# Patient Record
Sex: Female | Born: 1950 | Race: Black or African American | Hispanic: No | Marital: Married | Smoking: Never smoker
Health system: Southern US, Community
[De-identification: ages and names within clinical notes are randomized; demographics above are authoritative.]

## PROBLEM LIST (undated history)

## (undated) DIAGNOSIS — M1990 Unspecified osteoarthritis, unspecified site: Secondary | ICD-10-CM

## (undated) DIAGNOSIS — G5603 Carpal tunnel syndrome, bilateral upper limbs: Secondary | ICD-10-CM

## (undated) DIAGNOSIS — J45909 Unspecified asthma, uncomplicated: Secondary | ICD-10-CM

## (undated) DIAGNOSIS — Z973 Presence of spectacles and contact lenses: Secondary | ICD-10-CM

## (undated) DIAGNOSIS — Z889 Allergy status to unspecified drugs, medicaments and biological substances status: Secondary | ICD-10-CM

## (undated) DIAGNOSIS — M545 Low back pain, unspecified: Secondary | ICD-10-CM

## (undated) DIAGNOSIS — J189 Pneumonia, unspecified organism: Secondary | ICD-10-CM

## (undated) DIAGNOSIS — G8929 Other chronic pain: Secondary | ICD-10-CM

## (undated) DIAGNOSIS — E041 Nontoxic single thyroid nodule: Secondary | ICD-10-CM

## (undated) DIAGNOSIS — D126 Benign neoplasm of colon, unspecified: Secondary | ICD-10-CM

## (undated) DIAGNOSIS — E785 Hyperlipidemia, unspecified: Secondary | ICD-10-CM

## (undated) DIAGNOSIS — Z972 Presence of dental prosthetic device (complete) (partial): Secondary | ICD-10-CM

## (undated) DIAGNOSIS — D649 Anemia, unspecified: Secondary | ICD-10-CM

## (undated) DIAGNOSIS — K219 Gastro-esophageal reflux disease without esophagitis: Secondary | ICD-10-CM

## (undated) DIAGNOSIS — E569 Vitamin deficiency, unspecified: Secondary | ICD-10-CM

## (undated) DIAGNOSIS — R7611 Nonspecific reaction to tuberculin skin test without active tuberculosis: Secondary | ICD-10-CM

## (undated) DIAGNOSIS — E119 Type 2 diabetes mellitus without complications: Secondary | ICD-10-CM

## (undated) DIAGNOSIS — I10 Essential (primary) hypertension: Secondary | ICD-10-CM

## (undated) HISTORY — DX: Benign neoplasm of colon, unspecified: D12.6

## (undated) HISTORY — DX: Anemia, unspecified: D64.9

## (undated) HISTORY — DX: Unspecified asthma, uncomplicated: J45.909

## (undated) HISTORY — PX: TUBAL LIGATION: SHX77

## (undated) HISTORY — PX: COLONOSCOPY: SHX174

## (undated) HISTORY — DX: Hyperlipidemia, unspecified: E78.5

## (undated) HISTORY — PX: ABDOMINAL HYSTERECTOMY: SHX81

## (undated) HISTORY — DX: Essential (primary) hypertension: I10

## (undated) HISTORY — PX: KNEE ARTHROSCOPY: SHX127

## (undated) HISTORY — DX: Nontoxic single thyroid nodule: E04.1

---

## 1998-03-08 ENCOUNTER — Ambulatory Visit (HOSPITAL_COMMUNITY): Admission: RE | Admit: 1998-03-08 | Discharge: 1998-03-08 | Payer: Self-pay | Admitting: Obstetrics and Gynecology

## 1998-05-28 ENCOUNTER — Other Ambulatory Visit: Admission: RE | Admit: 1998-05-28 | Discharge: 1998-05-28 | Payer: Self-pay | Admitting: Obstetrics and Gynecology

## 1998-08-12 ENCOUNTER — Ambulatory Visit (HOSPITAL_COMMUNITY): Admission: RE | Admit: 1998-08-12 | Discharge: 1998-08-12 | Payer: Self-pay | Admitting: Gastroenterology

## 1999-01-22 ENCOUNTER — Other Ambulatory Visit: Admission: RE | Admit: 1999-01-22 | Discharge: 1999-01-22 | Payer: Self-pay | Admitting: Orthopedic Surgery

## 1999-03-26 ENCOUNTER — Encounter: Payer: Self-pay | Admitting: Obstetrics and Gynecology

## 1999-03-26 ENCOUNTER — Ambulatory Visit (HOSPITAL_COMMUNITY): Admission: RE | Admit: 1999-03-26 | Discharge: 1999-03-26 | Payer: Self-pay | Admitting: Obstetrics and Gynecology

## 2000-03-26 ENCOUNTER — Encounter: Payer: Self-pay | Admitting: Obstetrics and Gynecology

## 2000-03-26 ENCOUNTER — Ambulatory Visit (HOSPITAL_COMMUNITY): Admission: RE | Admit: 2000-03-26 | Discharge: 2000-03-26 | Payer: Self-pay | Admitting: Obstetrics and Gynecology

## 2000-04-28 ENCOUNTER — Encounter (INDEPENDENT_AMBULATORY_CARE_PROVIDER_SITE_OTHER): Payer: Self-pay

## 2000-04-28 ENCOUNTER — Other Ambulatory Visit: Admission: RE | Admit: 2000-04-28 | Discharge: 2000-04-28 | Payer: Self-pay | Admitting: Obstetrics and Gynecology

## 2000-05-14 ENCOUNTER — Encounter (INDEPENDENT_AMBULATORY_CARE_PROVIDER_SITE_OTHER): Payer: Self-pay

## 2000-05-14 ENCOUNTER — Other Ambulatory Visit: Admission: RE | Admit: 2000-05-14 | Discharge: 2000-05-14 | Payer: Self-pay | Admitting: Obstetrics and Gynecology

## 2001-04-05 ENCOUNTER — Ambulatory Visit (HOSPITAL_COMMUNITY): Admission: RE | Admit: 2001-04-05 | Discharge: 2001-04-05 | Payer: Self-pay | Admitting: Obstetrics and Gynecology

## 2001-04-05 ENCOUNTER — Encounter: Payer: Self-pay | Admitting: Obstetrics and Gynecology

## 2001-07-22 ENCOUNTER — Encounter (INDEPENDENT_AMBULATORY_CARE_PROVIDER_SITE_OTHER): Payer: Self-pay | Admitting: Specialist

## 2001-07-22 ENCOUNTER — Ambulatory Visit (HOSPITAL_COMMUNITY): Admission: RE | Admit: 2001-07-22 | Discharge: 2001-07-22 | Payer: Self-pay | Admitting: Gastroenterology

## 2001-11-28 ENCOUNTER — Encounter: Payer: Self-pay | Admitting: Internal Medicine

## 2001-11-28 ENCOUNTER — Encounter: Admission: RE | Admit: 2001-11-28 | Discharge: 2001-11-28 | Payer: Self-pay | Admitting: Internal Medicine

## 2003-02-20 ENCOUNTER — Encounter: Admission: RE | Admit: 2003-02-20 | Discharge: 2003-02-20 | Payer: Self-pay | Admitting: Internal Medicine

## 2003-02-20 ENCOUNTER — Encounter: Payer: Self-pay | Admitting: Internal Medicine

## 2003-08-17 ENCOUNTER — Encounter: Payer: Self-pay | Admitting: Internal Medicine

## 2003-08-17 ENCOUNTER — Encounter: Admission: RE | Admit: 2003-08-17 | Discharge: 2003-08-17 | Payer: Self-pay | Admitting: Internal Medicine

## 2005-07-31 ENCOUNTER — Ambulatory Visit (HOSPITAL_COMMUNITY): Admission: RE | Admit: 2005-07-31 | Discharge: 2005-07-31 | Payer: Self-pay | Admitting: Internal Medicine

## 2008-07-24 ENCOUNTER — Encounter: Admission: RE | Admit: 2008-07-24 | Discharge: 2008-07-24 | Payer: Self-pay | Admitting: Gastroenterology

## 2009-06-10 ENCOUNTER — Ambulatory Visit (HOSPITAL_COMMUNITY): Admission: RE | Admit: 2009-06-10 | Discharge: 2009-06-10 | Payer: Self-pay | Admitting: Internal Medicine

## 2009-07-24 ENCOUNTER — Ambulatory Visit: Payer: Self-pay | Admitting: Cardiology

## 2009-08-01 ENCOUNTER — Telehealth: Payer: Self-pay | Admitting: Cardiology

## 2009-08-01 ENCOUNTER — Telehealth (INDEPENDENT_AMBULATORY_CARE_PROVIDER_SITE_OTHER): Payer: Self-pay | Admitting: *Deleted

## 2009-08-06 ENCOUNTER — Encounter: Payer: Self-pay | Admitting: Cardiology

## 2009-08-06 ENCOUNTER — Ambulatory Visit: Payer: Self-pay

## 2009-08-09 ENCOUNTER — Ambulatory Visit: Payer: Self-pay | Admitting: Cardiology

## 2009-08-09 DIAGNOSIS — I10 Essential (primary) hypertension: Secondary | ICD-10-CM | POA: Insufficient documentation

## 2009-08-12 LAB — CONVERTED CEMR LAB
BUN: 12 mg/dL (ref 6–23)
CO2: 24 meq/L (ref 19–32)
Sodium: 140 meq/L (ref 135–145)

## 2009-08-22 ENCOUNTER — Ambulatory Visit: Payer: Self-pay | Admitting: Cardiology

## 2009-08-26 LAB — CONVERTED CEMR LAB
Calcium: 9.4 mg/dL (ref 8.4–10.5)
GFR calc non Af Amer: 73.28 mL/min (ref >60)
Potassium: 3.4 meq/L — ABNORMAL LOW (ref 3.5–5.1)
Sodium: 139 meq/L (ref 135–145)

## 2009-09-02 ENCOUNTER — Ambulatory Visit: Payer: Self-pay

## 2009-09-03 LAB — CONVERTED CEMR LAB
BUN: 11 mg/dL (ref 6–23)
GFR calc non Af Amer: 73.27 mL/min (ref >60)

## 2009-09-13 ENCOUNTER — Ambulatory Visit: Payer: Self-pay | Admitting: Cardiology

## 2009-09-16 LAB — CONVERTED CEMR LAB
BUN: 9 mg/dL (ref 6–23)
CO2: 22 meq/L (ref 19–32)
Calcium: 9.1 mg/dL (ref 8.4–10.5)
Glucose, Bld: 78 mg/dL (ref 70–99)
Potassium: 3.6 meq/L (ref 3.5–5.3)
Sodium: 137 meq/L (ref 135–145)

## 2010-07-01 LAB — FECAL OCCULT BLOOD, GUAIAC: Fecal Occult Blood: NEGATIVE

## 2010-10-20 ENCOUNTER — Encounter: Admission: RE | Admit: 2010-10-20 | Discharge: 2010-10-20 | Payer: Self-pay | Admitting: Gastroenterology

## 2010-10-31 ENCOUNTER — Encounter: Admission: RE | Admit: 2010-10-31 | Discharge: 2010-10-31 | Payer: Self-pay | Admitting: Internal Medicine

## 2010-11-28 ENCOUNTER — Ambulatory Visit (HOSPITAL_COMMUNITY)
Admission: RE | Admit: 2010-11-28 | Discharge: 2010-11-28 | Payer: Self-pay | Source: Home / Self Care | Attending: Obstetrics and Gynecology | Admitting: Obstetrics and Gynecology

## 2010-11-28 HISTORY — PX: BILATERAL SALPINGOOPHORECTOMY: SHX1223

## 2011-02-09 LAB — BASIC METABOLIC PANEL
BUN: 9 mg/dL (ref 6–23)
CO2: 27 mEq/L (ref 19–32)
Calcium: 9.4 mg/dL (ref 8.4–10.5)
Creatinine, Ser: 0.77 mg/dL (ref 0.4–1.2)
GFR calc Af Amer: >60 mL/min (ref >60)

## 2011-02-09 LAB — CBC
MCH: 25.9 pg — ABNORMAL LOW (ref 26.0–34.0)
MCHC: 31 g/dL (ref 30.0–36.0)
Platelets: 283 10*3/uL (ref 150–400)
RBC: 4.71 MIL/uL (ref 3.87–5.11)
RDW: 14.4 % (ref 11.5–15.5)

## 2011-02-09 LAB — SURGICAL PCR SCREEN: MRSA, PCR: NEGATIVE

## 2011-06-30 ENCOUNTER — Other Ambulatory Visit: Payer: Self-pay | Admitting: Cardiology

## 2012-01-23 ENCOUNTER — Other Ambulatory Visit: Payer: Self-pay | Admitting: Cardiology

## 2012-01-25 NOTE — Telephone Encounter (Signed)
PATIENT NEEDS TO SCHEDULE AN APPT, TO RECEIVE REFILLS

## 2012-02-15 ENCOUNTER — Other Ambulatory Visit: Payer: Self-pay | Admitting: Cardiology

## 2012-02-26 ENCOUNTER — Other Ambulatory Visit: Payer: Self-pay | Admitting: Cardiology

## 2012-03-15 ENCOUNTER — Other Ambulatory Visit (HOSPITAL_COMMUNITY): Payer: Self-pay | Admitting: Internal Medicine

## 2012-03-15 ENCOUNTER — Ambulatory Visit (HOSPITAL_COMMUNITY)
Admission: RE | Admit: 2012-03-15 | Discharge: 2012-03-15 | Disposition: A | Discharge status: Home / Self Care | Payer: BC Managed Care – PPO | Source: Ambulatory Visit | Attending: Internal Medicine | Admitting: Internal Medicine

## 2012-03-15 DIAGNOSIS — R05 Cough: Secondary | ICD-10-CM

## 2012-03-15 DIAGNOSIS — R059 Cough, unspecified: Secondary | ICD-10-CM

## 2012-03-15 DIAGNOSIS — R0602 Shortness of breath: Secondary | ICD-10-CM | POA: Insufficient documentation

## 2012-03-25 ENCOUNTER — Other Ambulatory Visit (HOSPITAL_COMMUNITY): Payer: Self-pay | Admitting: Internal Medicine

## 2012-03-25 ENCOUNTER — Ambulatory Visit (HOSPITAL_COMMUNITY)
Admission: RE | Admit: 2012-03-25 | Discharge: 2012-03-25 | Disposition: A | Discharge status: Home / Self Care | Payer: BC Managed Care – PPO | Source: Ambulatory Visit | Attending: Internal Medicine | Admitting: Internal Medicine

## 2012-03-25 DIAGNOSIS — R059 Cough, unspecified: Secondary | ICD-10-CM

## 2012-03-25 DIAGNOSIS — J189 Pneumonia, unspecified organism: Secondary | ICD-10-CM | POA: Insufficient documentation

## 2012-03-25 DIAGNOSIS — R05 Cough: Secondary | ICD-10-CM

## 2012-03-30 DIAGNOSIS — J189 Pneumonia, unspecified organism: Secondary | ICD-10-CM

## 2012-03-30 HISTORY — DX: Pneumonia, unspecified organism: J18.9

## 2012-04-19 ENCOUNTER — Other Ambulatory Visit (HOSPITAL_COMMUNITY): Payer: Self-pay | Admitting: Internal Medicine

## 2012-04-19 ENCOUNTER — Emergency Department (HOSPITAL_COMMUNITY)
Admission: EM | Admit: 2012-04-19 | Discharge: 2012-04-19 | Disposition: A | Discharge status: Home / Self Care | Payer: BC Managed Care – PPO | Attending: Emergency Medicine | Admitting: Emergency Medicine

## 2012-04-19 ENCOUNTER — Encounter (HOSPITAL_COMMUNITY): Payer: Self-pay | Admitting: Physical Medicine and Rehabilitation

## 2012-04-19 ENCOUNTER — Ambulatory Visit (HOSPITAL_COMMUNITY)
Admission: RE | Admit: 2012-04-19 | Discharge: 2012-04-19 | Disposition: A | Discharge status: Home / Self Care | Payer: BC Managed Care – PPO | Source: Ambulatory Visit | Attending: Internal Medicine | Admitting: Internal Medicine

## 2012-04-19 ENCOUNTER — Other Ambulatory Visit: Payer: Self-pay

## 2012-04-19 DIAGNOSIS — R5381 Other malaise: Secondary | ICD-10-CM | POA: Insufficient documentation

## 2012-04-19 DIAGNOSIS — R490 Dysphonia: Secondary | ICD-10-CM | POA: Insufficient documentation

## 2012-04-19 DIAGNOSIS — R0602 Shortness of breath: Secondary | ICD-10-CM | POA: Insufficient documentation

## 2012-04-19 DIAGNOSIS — Z8701 Personal history of pneumonia (recurrent): Secondary | ICD-10-CM | POA: Insufficient documentation

## 2012-04-19 DIAGNOSIS — M549 Dorsalgia, unspecified: Secondary | ICD-10-CM | POA: Insufficient documentation

## 2012-04-19 DIAGNOSIS — Z79899 Other long term (current) drug therapy: Secondary | ICD-10-CM | POA: Insufficient documentation

## 2012-04-19 DIAGNOSIS — J40 Bronchitis, not specified as acute or chronic: Secondary | ICD-10-CM

## 2012-04-19 LAB — BASIC METABOLIC PANEL
BUN: 14 mg/dL (ref 6–23)
Chloride: 102 mEq/L (ref 96–112)
GFR calc non Af Amer: 77 mL/min — ABNORMAL LOW (ref >90)
Glucose, Bld: 99 mg/dL (ref 70–99)
Potassium: 3.8 mEq/L (ref 3.5–5.1)
Sodium: 136 mEq/L (ref 135–145)

## 2012-04-19 LAB — CBC
HCT: 38.9 % (ref 36.0–46.0)
Hemoglobin: 12.5 g/dL (ref 12.0–15.0)
MCH: 26.5 pg (ref 26.0–34.0)
RBC: 4.72 MIL/uL (ref 3.87–5.11)

## 2012-04-19 NOTE — ED Provider Notes (Signed)
History     CSN: [id number]  Arrival date & time 04/19/12  1708   First MD Initiated Contact with Patient 04/19/12 1742      Chief Complaint  Patient presents with  . Shortness of Breath    (Consider location/radiation/quality/duration/timing/severity/associated sxs/prior treatment) HPI Pt presents with c/o cough, fatigue, shortness of breath.  Pt states she has taken a full course of zpack- then was prescribed moxifloxacin and has 2 more days of antibiotic course.  Her cough is improved but continues to have some cough productive of yellow mucous.  No fever/chills, no chest pain.  Generalized fatigue, no fainting.  Has been drinking liquids well.  She also c/o hoarse voice.  Pt had CXR earlier today ordered by her physician.  Per chart review, CXR performed today was normal.  There are no other alleviating or modifying factors, thera are no other associated systemic symptoms. She has also been using albuterol inhaler which does provide some relief.   No past medical history on file.  No past surgical history on file.  No family history on file.  History  Substance Use Topics  . Smoking status: Never Smoker   . Smokeless tobacco: Not on file  . Alcohol Use: No    OB History    Grav Para Term Preterm Abortions TAB SAB Ect Mult Living                  Review of Systems ROS reviewed and all otherwise negative except for mentioned in HPI  Allergies  Metronidazole  Home Medications   Current Outpatient Rx  Name Route Sig Dispense Refill  . ESTROGENS CONJ SYNTHETIC B 0.45 MG PO TABS Oral Take 0.45 mg by mouth daily.    Marland Kitchen FEXOFENADINE HCL 180 MG PO TABS Oral Take 180 mg by mouth daily.    Marland Kitchen MAGNESIUM PO Oral Take 1 tablet by mouth daily.    Marland Kitchen MOXIFLOXACIN HCL 400 MG PO TABS Oral Take 400 mg by mouth daily.    Marland Kitchen RABEPRAZOLE SODIUM 20 MG PO TBEC Oral Take 20 mg by mouth daily.    . RED YEAST RICE PO Oral Take 1 capsule by mouth daily.      BP 146/77  Pulse 60   Temp(Src) 97.8 F (36.6 C) (Oral)  Resp 18  SpO2 100% Vitals reviewed Physical Exam Physical Examination: General appearance - alert, well appearing, and in no distress Mental status - alert, oriented to person, place, and time Eyes - pupils equal and reactive, no conjunctival injection or scleral icterus Mouth - mucous membranes moist, pharynx normal without lesions Neck - supple, no significant adenopathy Chest - clear to auscultation, no wheezes, rales or rhonchi, symmetric air entry Heart - normal rate, regular rhythm, normal S1, S2, no murmurs, rubs, clicks or gallops Abdomen - soft, nontender, nondistended, no masses or organomegaly, nabs Extremities - peripheral pulses normal, no pedal edema, no clubbing or cyanosis Skin - normal coloration and turgor, no rashes, brisk cap refill  ED Course  Procedures (including critical care time)  Labs Reviewed  BASIC METABOLIC PANEL - Abnormal; Notable for the following:    GFR calc non Af Amer 77 (*)    GFR calc Af Amer 90 (*)    All other components within normal limits  CBC - Abnormal; Notable for the following:    WBC 14.6 (*)    RDW 15.7 (*)    All other components within normal limits  LAB REPORT - SCANNED   Dg  Chest 2 View  04/19/2012  *RADIOLOGY REPORT*  Clinical Data: Pneumonia, shortness of breath and upper back pain  CHEST - 2 VIEW  Comparison: March 25, 2012  Findings: The cardiac silhouette, mediastinum, pulmonary vasculature are within normal limits.  Both lungs are clear.  No new or recurrent infiltrates or effusions are identified today. There is no acute bony abnormality.  IMPRESSION: There is no evidence of acute cardiac or pulmonary process.  Original Report Authenticated By: Brandon Melnick, M.D.     1. Bronchitis       MDM  Pt presents with c/o cough, fatigue ongoing for several weeks.  She has taken zpack, currently taking moxifloxacin- has normal CXR performed today at an outside facility- images were  reviewed by me as well.  Suspect bronchitis- pt instructed to continue hydration, continue albuterol inhaler and finish abx as she had been prescribed by her doctor.  Labs reassuring- not anemic.  Discharged with strict return precautions.  Pt agreeable with plan.        Ethelda Chick, MD 04/20/12 [phone]

## 2012-04-19 NOTE — Discharge Instructions (Signed)
Return to the ED with any concerns including difficulty breathing despite using albuterol every 4 hours, not drinking fluids, decreased urine output, vomiting and not able to keep down liquids or medications, decreased level of alertness/lethargy, or any other alarming symptoms °

## 2012-04-19 NOTE — ED Notes (Signed)
Pt presents to department for evaluation of SOB. States she was told on 5/16 that she had URI and was prescribed a z-pack. States SOB has increased since. Went to [facility] today and was diagnosed with pneumonia. Pt states "I am tired and want to figure out what's going on." respirations unlabored at the time. She is speaking complete sentences. Denies pain. No signs of acute distress at the time.

## 2012-04-19 NOTE — ED Notes (Addendum)
Pt c/o feeling SOB. Pt reports she has been feeling very fatigued the past couple days. Pt reports she is congested and has a cough. Pt reports while talking & walking she feels winded. Pt reports her voice is hoarse.  Pt in no acute distress. Pt breathing unlabored.

## 2012-04-23 ENCOUNTER — Emergency Department (HOSPITAL_COMMUNITY)
Admission: EM | Admit: 2012-04-23 | Discharge: 2012-04-23 | Disposition: A | Discharge status: Home / Self Care | Payer: BC Managed Care – PPO | Attending: Emergency Medicine | Admitting: Emergency Medicine

## 2012-04-23 ENCOUNTER — Emergency Department (HOSPITAL_COMMUNITY): Payer: BC Managed Care – PPO

## 2012-04-23 ENCOUNTER — Encounter (HOSPITAL_COMMUNITY): Payer: Self-pay

## 2012-04-23 DIAGNOSIS — S51819A Laceration without foreign body of unspecified forearm, initial encounter: Secondary | ICD-10-CM

## 2012-04-23 DIAGNOSIS — W01119A Fall on same level from slipping, tripping and stumbling with subsequent striking against unspecified sharp object, initial encounter: Secondary | ICD-10-CM | POA: Insufficient documentation

## 2012-04-23 DIAGNOSIS — Y93H9 Activity, other involving exterior property and land maintenance, building and construction: Secondary | ICD-10-CM | POA: Insufficient documentation

## 2012-04-23 DIAGNOSIS — Y998 Other external cause status: Secondary | ICD-10-CM | POA: Insufficient documentation

## 2012-04-23 DIAGNOSIS — Y92009 Unspecified place in unspecified non-institutional (private) residence as the place of occurrence of the external cause: Secondary | ICD-10-CM | POA: Insufficient documentation

## 2012-04-23 DIAGNOSIS — S51809A Unspecified open wound of unspecified forearm, initial encounter: Secondary | ICD-10-CM | POA: Insufficient documentation

## 2012-04-23 MED ORDER — TETANUS-DIPHTH-ACELL PERTUSSIS 5-2.5-18.5 LF-MCG/0.5 IM SUSP
0.5000 mL | Freq: Once | INTRAMUSCULAR | Status: AC
  Administered 2012-04-23: 0.5 mL via INTRAMUSCULAR
  Filled 2012-04-23: qty 0.5

## 2012-04-23 NOTE — ED Notes (Signed)
Patient discharged home by Damon, RN.

## 2012-04-23 NOTE — ED Provider Notes (Signed)
Medical screening examination/treatment/procedure(s) were performed by non-physician practitioner and as supervising physician I was immediately available for consultation/collaboration.   Lyanne Co, MD 04/23/12 [phone]

## 2012-04-23 NOTE — Discharge Instructions (Signed)
Your wound has been repaired with sutures. Please keep the area clean and dry. You can wash the area as normal but do not scrub at the stitches. Your sutures will need to be removed in 7 days. You can follow up with your primary care doctor, at urgent care, or in the ED for this. When the stitches come out, apply vitamin E, cocoa butter, or Mederma to help with scarring. Use extra sunscreen on the scar when out in the sun to avoid the scar darkening. If you notice pus from the wound, redness of the surrounding skin, fever, swelling, or other concerns about worsening condition, return to the ED.  Laceration Care, Adult A laceration is a cut or lesion that goes through all layers of the skin and into the tissue just beneath the skin. TREATMENT  Some lacerations may not require closure. Some lacerations may not be able to be closed due to an increased risk of infection. It is important to see your caregiver as soon as possible after an injury to minimize the risk of infection and maximize the opportunity for successful closure. If closure is appropriate, pain medicines may be given, if needed. The wound will be cleaned to help prevent infection. Your caregiver will use stitches (sutures), staples, wound glue (adhesive), or skin adhesive strips to repair the laceration. These tools bring the skin edges together to allow for faster healing and a better cosmetic outcome. However, all wounds will heal with a scar. Once the wound has healed, scarring can be minimized by covering the wound with sunscreen during the day for 1 full year. HOME CARE INSTRUCTIONS  For sutures or staples:  Keep the wound clean and dry.   If you were given a bandage (dressing), you should change it at least once a day. Also, change the dressing if it becomes wet or dirty, or as directed by your caregiver.   Wash the wound with soap and water 2 times a day. Rinse the wound off with water to remove all soap. Pat the wound dry with a  clean towel.   After cleaning, apply a thin layer of the antibiotic ointment as recommended by your caregiver. This will help prevent infection and keep the dressing from sticking.   You may shower as usual after the first 24 hours. Do not soak the wound in water until the sutures are removed.   Only take over-the-counter or prescription medicines for pain, discomfort, or fever as directed by your caregiver.   Get your sutures or staples removed as directed by your caregiver.  For skin adhesive strips:  Keep the wound clean and dry.   Do not get the skin adhesive strips wet. You may bathe carefully, using caution to keep the wound dry.   If the wound gets wet, pat it dry with a clean towel.   Skin adhesive strips will fall off on their own. You may trim the strips as the wound heals. Do not remove skin adhesive strips that are still stuck to the wound. They will fall off in time.  For wound adhesive:  You may briefly wet your wound in the shower or bath. Do not soak or scrub the wound. Do not swim. Avoid periods of heavy perspiration until the skin adhesive has fallen off on its own. After showering or bathing, gently pat the wound dry with a clean towel.   Do not apply liquid medicine, cream medicine, or ointment medicine to your wound while the skin adhesive is in  place. This may loosen the film before your wound is healed.   If a dressing is placed over the wound, be careful not to apply tape directly over the skin adhesive. This may cause the adhesive to be pulled off before the wound is healed.   Avoid prolonged exposure to sunlight or tanning lamps while the skin adhesive is in place. Exposure to ultraviolet light in the first year will darken the scar.   The skin adhesive will usually remain in place for 5 to 10 days, then naturally fall off the skin. Do not pick at the adhesive film.  You may need a tetanus shot if:  You cannot remember when you had your last tetanus shot.    You have never had a tetanus shot.  If you get a tetanus shot, your arm may swell, get red, and feel warm to the touch. This is common and not a problem. If you need a tetanus shot and you choose not to have one, there is a rare chance of getting tetanus. Sickness from tetanus can be serious. SEEK MEDICAL CARE IF:   You have redness, swelling, or increasing pain in the wound.   You see a red line that goes away from the wound.   You have yellowish-white fluid (pus) coming from the wound.   You have a fever.   You notice a bad smell coming from the wound or dressing.   Your wound breaks open before or after sutures have been removed.   You notice something coming out of the wound such as wood or glass.   Your wound is on your hand or foot and you cannot move a finger or toe.  SEEK IMMEDIATE MEDICAL CARE IF:   Your pain is not controlled with prescribed medicine.   You have severe swelling around the wound causing pain and numbness or a change in color in your arm, hand, leg, or foot.   Your wound splits open and starts bleeding.   You have worsening numbness, weakness, or loss of function of any joint around or beyond the wound.   You develop painful lumps near the wound or on the skin anywhere on your body.  MAKE SURE YOU:   Understand these instructions.   Will watch your condition.   Will get help right away if you are not doing well or get worse.  Document Released: 11/16/2005 Document Revised: 11/05/2011 Document Reviewed: 05/12/2011 [facility] Patient Information 2012 [place], [place].

## 2012-04-23 NOTE — ED Notes (Signed)
Patient is AOx4 and comfortable with her discharge instructions.  Patient's family is driving her home. 

## 2012-04-23 NOTE — ED Provider Notes (Signed)
History     CSN: [id number]  Arrival date & time 04/23/12  1727   First MD Initiated Contact with Patient 04/23/12 1747      Chief Complaint  Patient presents with  . Laceration    (Consider location/radiation/quality/duration/timing/severity/associated sxs/prior treatment) HPI History from patient. 61 year old female who presents after a fall with a laceration to her right forearm. States she was working in the yard and tripped over some bricks. She fell, struck striking her right elbow and knee. Denies hitting head or LOC. She does not have any reduced range of motion to her knee or elbow. Denies distal numbness or weakness in her right arm. She attempted to dress the areas but was concerned that the area on her forearm may require sutures. Unsure of tetanus status.  History reviewed. No pertinent past medical history.  History reviewed. No pertinent past surgical history.  No family history on file.  History  Substance Use Topics  . Smoking status: Never Smoker   . Smokeless tobacco: Not on file  . Alcohol Use: No    OB History    Grav Para Term Preterm Abortions TAB SAB Ect Mult Living                  Review of Systems  Constitutional: Negative for fever, chills, activity change and appetite change.  Musculoskeletal: Negative for myalgias.  Skin: Positive for wound. Negative for color change.  Neurological: Negative for dizziness and headaches.    Allergies  Metronidazole  Home Medications   Current Outpatient Rx  Name Route Sig Dispense Refill  . ESTROGENS CONJ SYNTHETIC B 0.45 MG PO TABS Oral Take 0.45 mg by mouth daily.    Marland Kitchen FEXOFENADINE HCL 180 MG PO TABS Oral Take 180 mg by mouth daily.    Marland Kitchen MAGNESIUM PO Oral Take 1 tablet by mouth daily.    Marland Kitchen MONTELUKAST SODIUM 10 MG PO TABS Oral Take 10 mg by mouth daily.    Marland Kitchen RABEPRAZOLE SODIUM 20 MG PO TBEC Oral Take 20 mg by mouth daily.    . RED YEAST RICE PO Oral Take 1 capsule by mouth daily.    Marland Kitchen  MOXIFLOXACIN HCL 400 MG PO TABS Oral Take 400 mg by mouth daily.      BP 165/78  Pulse 95  Temp(Src) 97.6 F (36.4 C) (Oral)  Resp 20  SpO2 95%  Physical Exam  Nursing note and vitals reviewed. Constitutional: She appears well-developed and well-nourished. No distress.  HENT:  Head: Normocephalic and atraumatic.  Neck: Normal range of motion.  Cardiovascular: Normal rate.   Pulmonary/Chest: Effort normal.  Musculoskeletal: Normal range of motion.       Arms:      Slightly tender to palpation over the proximal ulna, no palpable crepitus or deformity. Neurovascularly intact distally. Good grip strength. Full range of motion in the elbow.  Neurological: She is alert.  Skin: Skin is warm and dry. She is not diaphoretic.       Superficial abrasion to R elbow, appears clean, bleeding well controlled Laceration to R forearm measuring ~1.5cm, clean appearing, bleeding well controlled Superficial abrasion to R knee  Psychiatric: She has a normal mood and affect.    ED Course  Procedures (including critical care time)  LACERATION REPAIR Performed by: Grant Fontana Authorized by: Grant Fontana Consent: Verbal consent obtained. Risks and benefits: risks, benefits and alternatives were discussed Consent given by: patient Patient identity confirmed: provided demographic data Prepped and Draped in normal sterile  fashion Wound explored  Laceration Location: R forearm  Laceration Length: 1.5cm  No Foreign Bodies seen or palpated  Anesthesia: local infiltration  Local anesthetic: lidocaine 2% with epinephrine  Anesthetic total: 4 ml  Irrigation method: syringe Amount of cleaning: standard  Skin closure: 4-0 Prolene  Number of sutures: 3  Technique: simple interrupted  Patient tolerance: Patient tolerated the procedure well with no immediate complications.  Labs Reviewed - No data to display Dg Elbow Complete Right  04/23/2012  *RADIOLOGY REPORT*  Clinical  Data: Fall laceration and pain and proximal forearm.  RIGHT ELBOW - COMPLETE 3+ VIEW  Comparison: None.  Findings: Laceration, with probable overlying bandage noted at proximal forearm.  The elbow is located.  There is no evidence of joint effusion.  Negative for fracture, joint space abnormality, or focal bony abnormality.  No discrete soft tissue swelling appreciated.  IMPRESSION:  1.  No acute bony abnormality. 2.  Laceration with probable overlying bandage material proximal forearm.  Original Report Authenticated By: Britta Mccreedy, M.D.     1. Laceration of forearm       MDM  Patient presents with a laceration to her right forearm after falling. No head injury. Full range of motion of the elbow. X-ray, which I personally reviewed is negative for fracture. Area was sutured, which patient tolerated well. Tetanus updated. She is to followup with her PCP for suture removal in one week. Wound care and return precautions discussed.       Grant Fontana, [place] 04/23/12 1936  Grant Fontana, [place] 04/23/12 [phone]

## 2012-04-23 NOTE — ED Notes (Signed)
Pt. Tripped over bricks denies laceration to rt. Knee and above rt. Elbow both are not bleeding and approximately 1 inch

## 2012-04-28 ENCOUNTER — Ambulatory Visit (INDEPENDENT_AMBULATORY_CARE_PROVIDER_SITE_OTHER): Payer: BC Managed Care – PPO | Admitting: Physician Assistant

## 2012-04-28 VITALS — BP 144/85 | HR 84 | Temp 97.7°F | Resp 16 | Ht 63.0 in | Wt 194.6 lb

## 2012-04-28 DIAGNOSIS — S51809A Unspecified open wound of unspecified forearm, initial encounter: Secondary | ICD-10-CM

## 2012-04-28 MED ORDER — CEPHALEXIN 500 MG PO CAPS: 500.0000 mg | ORAL_CAPSULE | Freq: Two times a day (BID) | ORAL | Status: AC

## 2012-04-28 NOTE — Progress Notes (Signed)
  Subjective:    Patient ID: Lauren Lopez, female    DOB: 1951-03-10, 61 y.o.   MRN: [id number]  HPI Ms. Mcenroe comes in today to follow up on a wound repair, right forearm, done 04/23/12 at [facility] ER after a ?fall vs tripping over brick in yard.  She had cardiac evaluation.  She is concerned about the wound as it has yellow material around the sutures and it is painful.  She has been changing the dressing regularly and applying antibiotic ointment.  She has not had fever or chills, numbness or tingling, purulent drainage or streaking.    Review of Systems As noted above in HPI    Objective:   Physical Exam Right proximal forearm with wound that has 3 simple interrupted sutures intact with moist wound edges with eschar noted.  No erythema or induration.  No streaking.  Lateral end of wound tender but abrasion noted in same area. No pain with wrist or elbow ROM.       Assessment & Plan:  Wound forearm  Recommend stop antibiotic ointment and let it dry out.  Wound care reviewed.  RX for Keflex provided if pain and eschar persists.  She will call or RTC if concerned.  Anticipate SR in 3-6 days.

## 2012-04-28 NOTE — Patient Instructions (Signed)
Keep wound dry and do not apply any ointments or creams.  You may wash the wound 1 -2 times a day.  Let it air out when you can.  Fill the antibiotic if it continues to be painful or it drains pus.  Call if you are not sure.

## 2012-06-30 ENCOUNTER — Other Ambulatory Visit (HOSPITAL_COMMUNITY): Payer: Self-pay | Admitting: Internal Medicine

## 2012-06-30 ENCOUNTER — Ambulatory Visit (HOSPITAL_COMMUNITY)
Admission: RE | Admit: 2012-06-30 | Discharge: 2012-06-30 | Disposition: A | Discharge status: Home / Self Care | Payer: PRIVATE HEALTH INSURANCE | Source: Ambulatory Visit | Attending: Internal Medicine | Admitting: Internal Medicine

## 2012-06-30 DIAGNOSIS — R059 Cough, unspecified: Secondary | ICD-10-CM | POA: Insufficient documentation

## 2012-06-30 DIAGNOSIS — R0989 Other specified symptoms and signs involving the circulatory and respiratory systems: Secondary | ICD-10-CM | POA: Insufficient documentation

## 2012-06-30 DIAGNOSIS — R7611 Nonspecific reaction to tuberculin skin test without active tuberculosis: Secondary | ICD-10-CM

## 2012-06-30 DIAGNOSIS — R05 Cough: Secondary | ICD-10-CM | POA: Insufficient documentation

## 2012-07-12 ENCOUNTER — Other Ambulatory Visit: Payer: Self-pay | Admitting: Orthopedic Surgery

## 2012-07-12 ENCOUNTER — Encounter (HOSPITAL_BASED_OUTPATIENT_CLINIC_OR_DEPARTMENT_OTHER): Payer: Self-pay | Admitting: *Deleted

## 2012-07-13 ENCOUNTER — Encounter (HOSPITAL_BASED_OUTPATIENT_CLINIC_OR_DEPARTMENT_OTHER): Payer: Self-pay | Admitting: *Deleted

## 2012-07-13 NOTE — Progress Notes (Signed)
No labs needed

## 2012-07-15 ENCOUNTER — Encounter (HOSPITAL_BASED_OUTPATIENT_CLINIC_OR_DEPARTMENT_OTHER): Payer: Self-pay | Admitting: Certified Registered Nurse Anesthetist

## 2012-07-15 ENCOUNTER — Ambulatory Visit (HOSPITAL_BASED_OUTPATIENT_CLINIC_OR_DEPARTMENT_OTHER): Payer: Worker's Compensation | Admitting: Anesthesiology

## 2012-07-15 ENCOUNTER — Encounter (HOSPITAL_BASED_OUTPATIENT_CLINIC_OR_DEPARTMENT_OTHER)
Admission: RE | Disposition: A | Discharge status: Home / Self Care | Payer: Self-pay | Source: Ambulatory Visit | Attending: Orthopedic Surgery

## 2012-07-15 ENCOUNTER — Ambulatory Visit (HOSPITAL_BASED_OUTPATIENT_CLINIC_OR_DEPARTMENT_OTHER)
Admission: RE | Admit: 2012-07-15 | Discharge: 2012-07-15 | Disposition: A | Discharge status: Home / Self Care | Payer: Worker's Compensation | Source: Ambulatory Visit | Attending: Orthopedic Surgery | Admitting: Orthopedic Surgery

## 2012-07-15 ENCOUNTER — Encounter (HOSPITAL_BASED_OUTPATIENT_CLINIC_OR_DEPARTMENT_OTHER): Payer: Self-pay

## 2012-07-15 ENCOUNTER — Encounter (HOSPITAL_BASED_OUTPATIENT_CLINIC_OR_DEPARTMENT_OTHER): Payer: Self-pay | Admitting: Anesthesiology

## 2012-07-15 DIAGNOSIS — G5600 Carpal tunnel syndrome, unspecified upper limb: Secondary | ICD-10-CM | POA: Insufficient documentation

## 2012-07-15 DIAGNOSIS — I10 Essential (primary) hypertension: Secondary | ICD-10-CM | POA: Insufficient documentation

## 2012-07-15 DIAGNOSIS — K219 Gastro-esophageal reflux disease without esophagitis: Secondary | ICD-10-CM | POA: Insufficient documentation

## 2012-07-15 HISTORY — DX: Presence of spectacles and contact lenses: Z97.3

## 2012-07-15 HISTORY — PX: CARPAL TUNNEL RELEASE: SHX101

## 2012-07-15 HISTORY — DX: Unspecified osteoarthritis, unspecified site: M19.90

## 2012-07-15 HISTORY — DX: Nonspecific reaction to tuberculin skin test without active tuberculosis: R76.11

## 2012-07-15 HISTORY — DX: Gastro-esophageal reflux disease without esophagitis: K21.9

## 2012-07-15 HISTORY — DX: Presence of dental prosthetic device (complete) (partial): Z97.2

## 2012-07-15 HISTORY — DX: Carpal tunnel syndrome, bilateral upper limbs: G56.03

## 2012-07-15 HISTORY — DX: Pneumonia, unspecified organism: J18.9

## 2012-07-15 HISTORY — DX: Allergy status to unspecified drugs, medicaments and biological substances: Z88.9

## 2012-07-15 SURGERY — CARPAL TUNNEL RELEASE
Anesthesia: General | Site: Hand | Laterality: Left | Wound class: Clean

## 2012-07-15 MED ORDER — METOCLOPRAMIDE HCL 5 MG/ML IJ SOLN
INTRAMUSCULAR | Status: DC | PRN
  Administered 2012-07-15: 10 mg via INTRAVENOUS

## 2012-07-15 MED ORDER — FENTANYL CITRATE 0.05 MG/ML IJ SOLN
INTRAMUSCULAR | Status: DC | PRN
  Administered 2012-07-15 (×2): 50 ug via INTRAVENOUS

## 2012-07-15 MED ORDER — LIDOCAINE HCL 2 % IJ SOLN
INTRAMUSCULAR | Status: DC | PRN
  Administered 2012-07-15: 4 mL

## 2012-07-15 MED ORDER — LACTATED RINGERS IV SOLN
INTRAVENOUS | Status: DC
  Administered 2012-07-15 (×2): via INTRAVENOUS

## 2012-07-15 MED ORDER — ONDANSETRON HCL 4 MG/2ML IJ SOLN
INTRAMUSCULAR | Status: DC | PRN
  Administered 2012-07-15: 4 mg via INTRAVENOUS

## 2012-07-15 MED ORDER — OXYCODONE-ACETAMINOPHEN 5-325 MG PO TABS: ORAL_TABLET | ORAL | Status: DC

## 2012-07-15 MED ORDER — MIDAZOLAM HCL 5 MG/5ML IJ SOLN
INTRAMUSCULAR | Status: DC | PRN
  Administered 2012-07-15: 1 mg via INTRAVENOUS

## 2012-07-15 MED ORDER — PROPOFOL 10 MG/ML IV EMUL
INTRAVENOUS | Status: DC | PRN
  Administered 2012-07-15: 220 mg via INTRAVENOUS

## 2012-07-15 MED ORDER — DEXAMETHASONE SODIUM PHOSPHATE 10 MG/ML IJ SOLN
INTRAMUSCULAR | Status: DC | PRN
  Administered 2012-07-15: 10 mg via INTRAVENOUS

## 2012-07-15 MED ORDER — CHLORHEXIDINE GLUCONATE 4 % EX LIQD: 60.0000 mL | Freq: Once | CUTANEOUS | Status: DC

## 2012-07-15 MED ORDER — LIDOCAINE HCL (CARDIAC) 20 MG/ML IV SOLN
INTRAVENOUS | Status: DC | PRN
  Administered 2012-07-15: 50 mg via INTRAVENOUS

## 2012-07-15 SURGICAL SUPPLY — 38 items
BANDAGE ADHESIVE 1X3 (GAUZE/BANDAGES/DRESSINGS) IMPLANT
BANDAGE ELASTIC 3 VELCRO ST LF (GAUZE/BANDAGES/DRESSINGS) ×2 IMPLANT
BLADE SURG 15 STRL LF DISP TIS (BLADE) ×1 IMPLANT
BLADE SURG 15 STRL SS (BLADE) ×2
BNDG CMPR 9X4 STRL LF SNTH (GAUZE/BANDAGES/DRESSINGS) ×1
BNDG ESMARK 4X9 LF (GAUZE/BANDAGES/DRESSINGS) ×1 IMPLANT
BRUSH SCRUB EZ PLAIN DRY (MISCELLANEOUS) ×2 IMPLANT
CLOTH BEACON ORANGE TIMEOUT ST (SAFETY) ×2 IMPLANT
CORDS BIPOLAR (ELECTRODE) ×1 IMPLANT
COVER MAYO STAND STRL (DRAPES) ×2 IMPLANT
COVER TABLE BACK 60X90 (DRAPES) ×2 IMPLANT
CUFF TOURNIQUET SINGLE 18IN (TOURNIQUET CUFF) ×1 IMPLANT
DECANTER SPIKE VIAL GLASS SM (MISCELLANEOUS) IMPLANT
DRAPE EXTREMITY T 121X128X90 (DRAPE) ×2 IMPLANT
DRAPE SURG 17X23 STRL (DRAPES) ×2 IMPLANT
GLOVE BIO SURGEON STRL SZ 6.5 (GLOVE) ×1 IMPLANT
GLOVE BIOGEL M STRL SZ7.5 (GLOVE) ×2 IMPLANT
GLOVE ECLIPSE 6.5 STRL STRAW (GLOVE) ×1 IMPLANT
GLOVE ORTHO TXT STRL SZ7.5 (GLOVE) ×2 IMPLANT
GOWN PREVENTION PLUS XLARGE (GOWN DISPOSABLE) ×2 IMPLANT
GOWN PREVENTION PLUS XXLARGE (GOWN DISPOSABLE) ×4 IMPLANT
NEEDLE 27GAX1X1/2 (NEEDLE) ×1 IMPLANT
PACK BASIN DAY SURGERY FS (CUSTOM PROCEDURE TRAY) ×2 IMPLANT
PAD CAST 3X4 CTTN HI CHSV (CAST SUPPLIES) ×1 IMPLANT
PADDING CAST ABS 4INX4YD NS (CAST SUPPLIES) ×1
PADDING CAST ABS COTTON 4X4 ST (CAST SUPPLIES) ×1 IMPLANT
PADDING CAST COTTON 3X4 STRL (CAST SUPPLIES) ×2
SPLINT PLASTER CAST XFAST 3X15 (CAST SUPPLIES) ×5 IMPLANT
SPLINT PLASTER XTRA FASTSET 3X (CAST SUPPLIES) ×5
SPONGE GAUZE 4X4 12PLY (GAUZE/BANDAGES/DRESSINGS) ×2 IMPLANT
STOCKINETTE 4X48 STRL (DRAPES) ×2 IMPLANT
STRIP CLOSURE SKIN 1/2X4 (GAUZE/BANDAGES/DRESSINGS) ×2 IMPLANT
SUT PROLENE 3 0 PS 2 (SUTURE) ×2 IMPLANT
SYR 3ML 23GX1 SAFETY (SYRINGE) IMPLANT
SYR CONTROL 10ML LL (SYRINGE) ×1 IMPLANT
TRAY DSU PREP LF (CUSTOM PROCEDURE TRAY) ×2 IMPLANT
UNDERPAD 30X30 INCONTINENT (UNDERPADS AND DIAPERS) ×2 IMPLANT
WATER STERILE IRR 1000ML POUR (IV SOLUTION) ×2 IMPLANT

## 2012-07-15 NOTE — Anesthesia Preprocedure Evaluation (Signed)
Anesthesia Evaluation  Patient identified by MRN, date of birth, ID band Patient awake    Reviewed: Allergy & Precautions, H&P , NPO status , Patient's Chart, lab work & pertinent test results, reviewed documented beta blocker date and time   Airway Mallampati: II TM Distance: >3 FB Neck ROM: full    Dental   Pulmonary neg pulmonary ROS, pneumonia -, resolved,  breath sounds clear to auscultation        Cardiovascular hypertension, On Medications negative cardio ROS  Rhythm:regular     Neuro/Psych  Neuromuscular disease negative psych ROS   GI/Hepatic Neg liver ROS, GERD-  Medicated and Controlled,  Endo/Other  negative endocrine ROS  Renal/GU negative Renal ROS  negative genitourinary   Musculoskeletal   Abdominal   Peds  Hematology negative hematology ROS (+)   Anesthesia Other Findings See surgeon's H&P   Reproductive/Obstetrics negative OB ROS                           Anesthesia Physical Anesthesia Plan  ASA: II  Anesthesia Plan: General   Post-op Pain Management:    Induction: Intravenous  Airway Management Planned: LMA  Additional Equipment:   Intra-op Plan:   Post-operative Plan: Extubation in OR  Informed Consent: I have reviewed the patients History and Physical, chart, labs and discussed the procedure including the risks, benefits and alternatives for the proposed anesthesia with the patient or authorized representative who has indicated his/her understanding and acceptance.   Dental Advisory Given  Plan Discussed with: CRNA and Surgeon  Anesthesia Plan Comments:         Anesthesia Quick Evaluation

## 2012-07-15 NOTE — Anesthesia Postprocedure Evaluation (Signed)
Anesthesia Post Note  Patient: Lauren Lopez  Procedure(s) Performed: Procedure(s) (LRB): CARPAL TUNNEL RELEASE (Left)  Anesthesia type: General  Patient location: PACU  Post pain: Pain level controlled  Post assessment: Patient's Cardiovascular Status Stable  Last Vitals:  Filed Vitals:   07/15/12 0945  BP: 147/84  Pulse: 60  Temp:   Resp: 11    Post vital signs: Reviewed and stable  Level of consciousness: alert  Complications: No apparent anesthesia complications

## 2012-07-15 NOTE — Anesthesia Procedure Notes (Signed)
Procedure Name: LMA Insertion Date/Time: 07/15/2012 8:37 AM Performed by: Lucine Bilski D Pre-anesthesia Checklist: Patient identified, Emergency Drugs available, Suction available and Patient being monitored Patient Re-evaluated:Patient Re-evaluated prior to inductionOxygen Delivery Method: Circle System Utilized Preoxygenation: Pre-oxygenation with 100% oxygen Intubation Type: IV induction Ventilation: Mask ventilation without difficulty LMA: LMA inserted LMA Size: 4.0 Number of attempts: 1 Airway Equipment and Method: bite block Placement Confirmation: positive ETCO2 Tube secured with: Tape Dental Injury: Teeth and Oropharynx as per pre-operative assessment

## 2012-07-15 NOTE — Brief Op Note (Signed)
07/15/2012  9:10 AM  PATIENT:  Lauren Lopez  61 y.o. female  PRE-OPERATIVE DIAGNOSIS:  bilateral cts  POST-OPERATIVE DIAGNOSIS:  left carpal tunnel syndrome  PROCEDURE:  Procedure(s) (LRB): CARPAL TUNNEL RELEASE (Left)  SURGEON:  Surgeon(s) and Role:    * Wyn Forster., MD - Primary  PHYSICIAN ASSISTANT:   ASSISTANTS: Mallory Shirk.A-C    ANESTHESIA:   local  EBL:  Total I/O In: 1000 [I.V.:1000] Out: -   BLOOD ADMINISTERED:none  DRAINS: none   LOCAL MEDICATIONS USED:  LIDOCAINE   SPECIMEN:  No Specimen  DISPOSITION OF SPECIMEN:  N/A  COUNTS:  YES  TOURNIQUET:   Total Tourniquet Time Documented: Upper Arm (Left) - 12 minutes  DICTATION: .Other Dictation: Dictation Number [id number]  PLAN OF CARE: Discharge to home after PACU  PATIENT DISPOSITION:  PACU - hemodynamically stable.

## 2012-07-15 NOTE — Transfer of Care (Signed)
Immediate Anesthesia Transfer of Care Note  Patient: Lauren Lopez  Procedure(s) Performed: Procedure(s) (LRB): CARPAL TUNNEL RELEASE (Left)  Patient Location: PACU  Anesthesia Type: General  Level of Consciousness: awake, alert , oriented and patient cooperative  Airway & Oxygen Therapy: Patient Spontanous Breathing and Patient connected to face mask oxygen  Post-op Assessment: Report given to PACU RN and Post -op Vital signs reviewed and stable  Post vital signs: Reviewed and stable  Complications: No apparent anesthesia complications

## 2012-07-15 NOTE — H&P (Signed)
Lauren Lopez is an 61 y.o. female.   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 She has had a more than 9 month history of numbness in her left hand and occasional numbness in the right hand. She notes dysesthesia extending from her finger tips towards the elbow. She has had no history of neck pain or neck injury. She has been wearing splints but has had nocturnal numbness in the left hand despite splinting. She was worked up at [name] in November 2012 and was noted to have possible stenosing tenosynovitis and carpal tunnel syndrome. She was referred to see Dr. Gean Lopez at [facility] in December 2012. Dr. Turner Lopez identified carpal tunnel syndrome. He recommended electrodiagnostic studies. These were completed by Dr. Clarisse Lopez on 12-11-11. Dr. Clarisse Lopez identified evidence of mild to moderate left carpal tunnel syndrome. Dr. Turner Lopez advised Lauren Lopez that she could proceed with release of the left transverse carpal ligament or consider a steroid injection. In mid July thousand 13. She underwent injection into the left ulnar bursa and was placed in a splint for nighttime use. She reports that it 3 weeks later. The conservative treatment. Provided her with essentially no relief of her symptoms. She was awakened 7 out of 7 nights with numbness and tingling. She wishes to proceed now with surgical intervention.   Past Medical History  Diagnosis Date  . Pneumonia 5/13  . Multiple allergies   . GERD (gastroesophageal reflux disease)   . Arthritis   . Bilateral carpal tunnel syndrome   . Wears glasses   . Wears dentures     upper  . Positive PPD     Past Surgical History  Procedure Date  . Bilateral salpingoophorectomy 11/28/2010    open laparoscopy with adhesiolysis also  . Knee arthroscopy 2005    rt  . Colonoscopy     No family history on file. Social  History:  reports that she has never smoked. She does not have any smokeless tobacco history on file. She reports that she does not drink alcohol or use illicit drugs.  Allergies:  Allergies  Allergen Reactions  . Metronidazole Other (See Comments)    unknown    Medications Prior to Admission  Medication Sig Dispense Refill  . estrogens conjugated, synthetic B, (ENJUVIA) 0.45 MG tablet Take 0.45 mg by mouth daily.      . fexofenadine (ALLEGRA) 180 MG tablet Take 180 mg by mouth daily.      Marland Kitchen MAGNESIUM PO Take 1 tablet by mouth daily.      . montelukast (SINGULAIR) 10 MG tablet Take 10 mg by mouth daily.      . RABEprazole (ACIPHEX) 20 MG tablet Take 20 mg by mouth daily.      . Red Yeast Rice Extract (RED YEAST RICE PO) Take 1 capsule by mouth daily.        No results found for this or any previous visit (from the past 48 hour(s)).  No results found.   Pertinent items are noted in HPI.  Height 5\' 3"  (1.6 m), weight 86.183 kg (190 lb).  General appearance: alert Head: Normocephalic, without obvious abnormality Neck: supple, symmetrical, trachea midline Resp: clear to auscultation bilaterally Cardio: regular rate and rhythm GI: normal findings: bowel sounds normal Extremities: . Inspection of her hand reveals mild thenar atrophy on the left. She has full ROM of her fingers in flexion/extension. She  does have positive provocative signs of carpal tunnel syndrome. She does not have active signs of stenosing tenosynovitis at this time.  She does report that she has had episodic numbness on the right. She did have conduction studies performed by Dr. Clarisse Lopez that revealed borderline conduction parameters with median and motor latency and sensory latency. EMG was normal bilaterally.   Pulses: 2+ and symmetric Skin: normal Neurologic: Grossly normal    Assessment/Plan Impression: Left carpal tunnel syndrome  Plan: Patient to be taken to the operating room to undergo left carpal  tunnel release. The procedure, risks, benefits, and postoperative course were discussed with the patient at length and she was in agreement with this plan.  Lauren Lopez 07/15/2012, 7:12 AM     H&P documentation: 07/15/2012  -History and Physical Reviewed  -Patient has been re-examined  -No change in the plan of care  Lauren Forster, MD

## 2012-07-18 ENCOUNTER — Encounter (HOSPITAL_BASED_OUTPATIENT_CLINIC_OR_DEPARTMENT_OTHER): Payer: Self-pay | Admitting: Orthopedic Surgery

## 2012-07-18 NOTE — Op Note (Signed)
NAME:  [name], [name]             ACCOUNT NO.:  [id number]  MEDICAL RECORD NO.:  [id number]  LOCATION:                                 FACILITY:  PHYSICIAN:  Katy Fitch. Hendricks Schwandt, M.D. DATE OF BIRTH:  11-19-1951  DATE OF PROCEDURE:  07/15/2012 DATE OF DISCHARGE:                              OPERATIVE REPORT   PREOPERATIVE DIAGNOSIS:  Entrapment neuropathy, median nerve, left carpal tunnel.  POSTOPERATIVE DIAGNOSIS:  Entrapment neuropathy, median nerve, left carpal tunnel.  OPERATIONS:  Release of left transverse carpal ligament.  OPERATING SURGEON:  Katy Fitch. Devontaye Ground, M.D.  ASSISTANT:  Marveen Reeks. [name], PA-C.  ANESTHESIA:  General by LMA.  SUPERVISING ANESTHESIOLOGIST:  Janetta Hora. Gelene Mink, MD.  INDICATIONS:  Lauren Lopez is a 61 year old employee of [employer] with a history of hand numbness and pain.  Clinical examination revealed signs of significant carpal tunnel syndrome.  Electrodiagnostic studies completed by Dr. Clarisse Gouge revealed significant median neuropathy at wrist level.  Due to the failure to respond to nonoperative measures including a steroid injection provided previously by Dr. Turner Daniels, she is advised to proceed with release of her left transverse carpal ligament at this time.  Surgery and aftercare were described in detail.  Questions were invited and answered.  We advised that she likely be out of work for approximately 4-6 weeks following surgery.  Questions regarding the anticipated procedure were invited and answered in detail.  PROCEDURE:  Lauren Lopez was brought to room 1 of the [facility] and placed in supine position on the operating table.  Following the induction of general anesthesia by LMA technique, the left arm was prepped with Betadine soap and solution, sterilely draped.  A pneumatic tourniquet was applied to the proximal left brachium.  Following exsanguination of the left arm with Esmarch bandage, an arterial tourniquet  was inflated to 220 mmHg.  Procedure commenced with a short incision in the line of the ring finger in the palm. Subcutaneous tissues were carefully divided to reveal the palmar fascia. This split longitudinally to reveal the common sensory branch of the median nerve.  These were followed back to the transverse carpal ligament, which was gently isolated from the median nerve.  The transverse carpal ligament was then sequentially released with scissors along its ulnar border extending into the distal forearm.  This widely opened carpal canal.  No masses or other predicaments were noted. Bleeding points along the margin of the released ligament were electrocauterized with bipolar current followed by repair of the skin with intradermal 3-0 Prolene suture.  A compressive dressing was applied with a volar plaster splint, maintaining the wrist in 15 degrees of dorsiflexion.  For aftercare, Lauren Lopez is provided instruction to elevate her hand for the next 4-5 days.  She will begin immediate range of motion exercises to her fingers and shoulder.  She will use Tylenol or Aleve and/or Percocet as needed for pain.  She is provided a prescription for Percocet 5/325, 1 p.o. q.4-6 hours p.r.n. pain, 20 tablets, without refill.     Katy Fitch Nichollas Perusse, M.D.     RVS/MEDQ  D:  07/15/2012  T:  07/16/2012  Job:  [id number]

## 2012-11-15 ENCOUNTER — Ambulatory Visit (HOSPITAL_COMMUNITY)
Admission: RE | Admit: 2012-11-15 | Discharge: 2012-11-15 | Disposition: A | Discharge status: Home / Self Care | Payer: PRIVATE HEALTH INSURANCE | Source: Ambulatory Visit | Attending: Internal Medicine | Admitting: Internal Medicine

## 2012-11-15 ENCOUNTER — Other Ambulatory Visit (HOSPITAL_COMMUNITY): Payer: Self-pay | Admitting: Internal Medicine

## 2012-11-15 DIAGNOSIS — M25519 Pain in unspecified shoulder: Secondary | ICD-10-CM | POA: Insufficient documentation

## 2012-11-15 DIAGNOSIS — R52 Pain, unspecified: Secondary | ICD-10-CM

## 2013-01-02 ENCOUNTER — Other Ambulatory Visit: Payer: Self-pay | Admitting: Orthopedic Surgery

## 2013-01-02 DIAGNOSIS — M25511 Pain in right shoulder: Secondary | ICD-10-CM

## 2013-01-07 ENCOUNTER — Other Ambulatory Visit: Payer: Self-pay

## 2013-01-18 ENCOUNTER — Encounter (HOSPITAL_BASED_OUTPATIENT_CLINIC_OR_DEPARTMENT_OTHER): Payer: Self-pay | Admitting: *Deleted

## 2013-01-18 NOTE — Progress Notes (Signed)
Pt was here 8/13-did well-no labs needed

## 2013-01-20 ENCOUNTER — Encounter (HOSPITAL_BASED_OUTPATIENT_CLINIC_OR_DEPARTMENT_OTHER)
Admission: RE | Disposition: A | Discharge status: Home / Self Care | Payer: Self-pay | Source: Ambulatory Visit | Attending: Orthopedic Surgery

## 2013-01-20 ENCOUNTER — Encounter (HOSPITAL_BASED_OUTPATIENT_CLINIC_OR_DEPARTMENT_OTHER): Payer: Self-pay

## 2013-01-20 ENCOUNTER — Ambulatory Visit (HOSPITAL_BASED_OUTPATIENT_CLINIC_OR_DEPARTMENT_OTHER)
Admission: RE | Admit: 2013-01-20 | Discharge: 2013-01-20 | Disposition: A | Discharge status: Home / Self Care | Payer: BC Managed Care – PPO | Source: Ambulatory Visit | Attending: Orthopedic Surgery | Admitting: Orthopedic Surgery

## 2013-01-20 ENCOUNTER — Encounter (HOSPITAL_BASED_OUTPATIENT_CLINIC_OR_DEPARTMENT_OTHER): Payer: Self-pay | Admitting: Anesthesiology

## 2013-01-20 ENCOUNTER — Ambulatory Visit (HOSPITAL_BASED_OUTPATIENT_CLINIC_OR_DEPARTMENT_OTHER): Payer: BC Managed Care – PPO | Admitting: Anesthesiology

## 2013-01-20 DIAGNOSIS — M75101 Unspecified rotator cuff tear or rupture of right shoulder, not specified as traumatic: Secondary | ICD-10-CM

## 2013-01-20 DIAGNOSIS — M25819 Other specified joint disorders, unspecified shoulder: Secondary | ICD-10-CM | POA: Insufficient documentation

## 2013-01-20 DIAGNOSIS — I10 Essential (primary) hypertension: Secondary | ICD-10-CM | POA: Insufficient documentation

## 2013-01-20 DIAGNOSIS — M75120 Complete rotator cuff tear or rupture of unspecified shoulder, not specified as traumatic: Secondary | ICD-10-CM | POA: Insufficient documentation

## 2013-01-20 HISTORY — DX: Unspecified rotator cuff tear or rupture of right shoulder, not specified as traumatic: M75.101

## 2013-01-20 HISTORY — PX: SHOULDER ARTHROSCOPY WITH ROTATOR CUFF REPAIR AND SUBACROMIAL DECOMPRESSION: SHX5686

## 2013-01-20 LAB — POCT HEMOGLOBIN-HEMACUE: Hemoglobin: 13 g/dL (ref 12.0–15.0)

## 2013-01-20 SURGERY — SHOULDER ARTHROSCOPY WITH ROTATOR CUFF REPAIR AND SUBACROMIAL DECOMPRESSION
Anesthesia: General | Site: Shoulder | Laterality: Right | Wound class: Clean

## 2013-01-20 MED ORDER — EPHEDRINE SULFATE 50 MG/ML IJ SOLN
INTRAMUSCULAR | Status: DC | PRN
  Administered 2013-01-20 (×2): 10 mg via INTRAVENOUS

## 2013-01-20 MED ORDER — SENNA-DOCUSATE SODIUM 8.6-50 MG PO TABS: 1.0000 | ORAL_TABLET | Freq: Every day | ORAL | Status: DC

## 2013-01-20 MED ORDER — SODIUM CHLORIDE 0.9 % IR SOLN
Status: DC | PRN
  Administered 2013-01-20: 12000 mL

## 2013-01-20 MED ORDER — ONDANSETRON HCL 4 MG/2ML IJ SOLN
INTRAMUSCULAR | Status: DC | PRN
  Administered 2013-01-20: 4 mg via INTRAVENOUS

## 2013-01-20 MED ORDER — FENTANYL CITRATE 0.05 MG/ML IJ SOLN
50.0000 ug | INTRAMUSCULAR | Status: DC | PRN
  Administered 2013-01-20: 100 ug via INTRAVENOUS

## 2013-01-20 MED ORDER — OXYCODONE-ACETAMINOPHEN 10-325 MG PO TABS: 1.0000 | ORAL_TABLET | Freq: Four times a day (QID) | ORAL | Status: DC | PRN

## 2013-01-20 MED ORDER — PROPOFOL 10 MG/ML IV BOLUS
INTRAVENOUS | Status: DC | PRN
  Administered 2013-01-20: 200 mg via INTRAVENOUS

## 2013-01-20 MED ORDER — OXYCODONE HCL 5 MG/5ML PO SOLN: 5.0000 mg | Freq: Once | ORAL | Status: AC | PRN

## 2013-01-20 MED ORDER — LACTATED RINGERS IV SOLN
INTRAVENOUS | Status: DC
  Administered 2013-01-20 (×2): via INTRAVENOUS

## 2013-01-20 MED ORDER — BUPIVACAINE HCL (PF) 0.5 % IJ SOLN
INTRAMUSCULAR | Status: DC | PRN
  Administered 2013-01-20: 15 mL

## 2013-01-20 MED ORDER — HYDROMORPHONE HCL PF 1 MG/ML IJ SOLN: 0.2500 mg | INTRAMUSCULAR | Status: DC | PRN

## 2013-01-20 MED ORDER — LIDOCAINE HCL (CARDIAC) 20 MG/ML IV SOLN
INTRAVENOUS | Status: DC | PRN
  Administered 2013-01-20: 60 mg via INTRAVENOUS

## 2013-01-20 MED ORDER — MIDAZOLAM HCL 2 MG/2ML IJ SOLN
1.0000 mg | INTRAMUSCULAR | Status: DC | PRN
  Administered 2013-01-20: 2 mg via INTRAVENOUS

## 2013-01-20 MED ORDER — LIDOCAINE HCL 4 % MT SOLN
OROMUCOSAL | Status: DC | PRN
  Administered 2013-01-20: 4 mL via TOPICAL

## 2013-01-20 MED ORDER — SUCCINYLCHOLINE CHLORIDE 20 MG/ML IJ SOLN
INTRAMUSCULAR | Status: DC | PRN
  Administered 2013-01-20: 120 mg via INTRAVENOUS

## 2013-01-20 MED ORDER — METHOCARBAMOL 500 MG PO TABS: 500.0000 mg | ORAL_TABLET | Freq: Four times a day (QID) | ORAL | Status: DC

## 2013-01-20 MED ORDER — PROMETHAZINE HCL 25 MG PO TABS: 25.0000 mg | ORAL_TABLET | Freq: Four times a day (QID) | ORAL | Status: DC | PRN

## 2013-01-20 MED ORDER — OXYCODONE HCL 5 MG PO TABS
5.0000 mg | ORAL_TABLET | Freq: Once | ORAL | Status: AC | PRN
  Administered 2013-01-20: 5 mg via ORAL

## 2013-01-20 MED ORDER — DEXAMETHASONE SODIUM PHOSPHATE 4 MG/ML IJ SOLN
INTRAMUSCULAR | Status: DC | PRN
  Administered 2013-01-20: 10 mg via INTRAVENOUS

## 2013-01-20 MED ORDER — METOCLOPRAMIDE HCL 5 MG/ML IJ SOLN: 10.0000 mg | Freq: Once | INTRAMUSCULAR | Status: DC | PRN

## 2013-01-20 SURGICAL SUPPLY — 70 items
ANCH SUT SWLK 19.1X4.75 (Anchor) ×1 IMPLANT
ANCHOR SUT BIO SW 4.75X19.1 (Anchor) ×1 IMPLANT
APL SKNCLS STERI-STRIP NONHPOA (GAUZE/BANDAGES/DRESSINGS) ×1
BENZOIN TINCTURE PRP APPL 2/3 (GAUZE/BANDAGES/DRESSINGS) ×2 IMPLANT
BLADE CUTTER GATOR 3.5 (BLADE) ×2 IMPLANT
BLADE GREAT WHITE 4.2 (BLADE) IMPLANT
BLADE SURG 15 STRL LF DISP TIS (BLADE) IMPLANT
BLADE SURG 15 STRL SS (BLADE)
BUR OVAL 4.0 (BURR) ×1 IMPLANT
BUR OVAL 4.0X59 (BURR) IMPLANT
BUR OVAL 6.0 (BURR) IMPLANT
CANISTER OMNI JUG 16 LITER (MISCELLANEOUS) ×2 IMPLANT
CANNULA 5.75X71 LONG (CANNULA) ×2 IMPLANT
CANNULA TWIST IN 8.25X7CM (CANNULA) ×1 IMPLANT
CLOTH BEACON ORANGE TIMEOUT ST (SAFETY) ×2 IMPLANT
DECANTER SPIKE VIAL GLASS SM (MISCELLANEOUS) IMPLANT
DRAPE INCISE IOBAN 66X45 STRL (DRAPES) ×2 IMPLANT
DRAPE SHOULDER BEACH CHAIR (DRAPES) ×2 IMPLANT
DRAPE U 20/CS (DRAPES) ×2 IMPLANT
DRAPE U-SHAPE 47X51 STRL (DRAPES) ×2 IMPLANT
DRSG PAD ABDOMINAL 8X10 ST (GAUZE/BANDAGES/DRESSINGS) ×2 IMPLANT
DURAPREP 26ML APPLICATOR (WOUND CARE) ×2 IMPLANT
ELECT REM PT RETURN 9FT ADLT (ELECTROSURGICAL)
ELECTRODE REM PT RTRN 9FT ADLT (ELECTROSURGICAL) ×1 IMPLANT
FIBERSTICK 2 (SUTURE) IMPLANT
GLOVE BIO SURGEON STRL SZ 6.5 (GLOVE) ×1 IMPLANT
GLOVE BIO SURGEON STRL SZ8 (GLOVE) ×2 IMPLANT
GLOVE BIOGEL PI IND STRL 7.0 (GLOVE) IMPLANT
GLOVE BIOGEL PI IND STRL 8 (GLOVE) ×2 IMPLANT
GLOVE BIOGEL PI INDICATOR 7.0 (GLOVE) ×1
GLOVE BIOGEL PI INDICATOR 8 (GLOVE) ×3
GLOVE EXAM NITRILE EXT CUFF MD (GLOVE) ×1 IMPLANT
GLOVE ORTHO TXT STRL SZ7.5 (GLOVE) ×3 IMPLANT
GOWN BRE IMP PREV XXLGXLNG (GOWN DISPOSABLE) ×5 IMPLANT
IMMOBILIZER SHOULDER XLGE (ORTHOPEDIC SUPPLIES) IMPLANT
IV NS IRRIG 3000ML ARTHROMATIC (IV SOLUTION) ×6 IMPLANT
KIT SHOULDER TRACTION (DRAPES) ×2 IMPLANT
LASSO SUT 90 DEGREE (SUTURE) IMPLANT
NDL SCORPION MULTI FIRE (NEEDLE) IMPLANT
NEEDLE SCORPION MULTI FIRE (NEEDLE) ×2 IMPLANT
PACK ARTHROSCOPY DSU (CUSTOM PROCEDURE TRAY) ×2 IMPLANT
PACK BASIN DAY SURGERY FS (CUSTOM PROCEDURE TRAY) ×2 IMPLANT
SET ARTHROSCOPY TUBING (MISCELLANEOUS) ×2
SET ARTHROSCOPY TUBING LN (MISCELLANEOUS) ×1 IMPLANT
SHEET MEDIUM DRAPE 40X70 STRL (DRAPES) ×2 IMPLANT
SLEEVE SCD COMPRESS KNEE MED (MISCELLANEOUS) ×2 IMPLANT
SLING ARM FOAM STRAP LRG (SOFTGOODS) ×1 IMPLANT
SLING ARM FOAM STRAP MED (SOFTGOODS) IMPLANT
SLING ARM FOAM STRAP XLG (SOFTGOODS) IMPLANT
SLING ARM IMMOBILIZER LRG (SOFTGOODS) ×1 IMPLANT
SLING ARM IMMOBILIZER MED (SOFTGOODS) IMPLANT
SPONGE GAUZE 4X4 12PLY (GAUZE/BANDAGES/DRESSINGS) ×2 IMPLANT
STRIP CLOSURE SKIN 1/2X4 (GAUZE/BANDAGES/DRESSINGS) ×2 IMPLANT
SUT FIBERWIRE #2 38 T-5 BLUE (SUTURE)
SUT LASSO 45 DEGREE (SUTURE) IMPLANT
SUT LASSO 45 DEGREE LEFT (SUTURE) IMPLANT
SUT LASSO 45D RIGHT (SUTURE) IMPLANT
SUT MNCRL AB 4-0 PS2 18 (SUTURE) ×1 IMPLANT
SUT PDS AB 1 CT  36 (SUTURE)
SUT PDS AB 1 CT 36 (SUTURE) IMPLANT
SUT TIGER TAPE 7 IN WHITE (SUTURE) IMPLANT
SUT VIC AB 3-0 SH 27 (SUTURE)
SUT VIC AB 3-0 SH 27X BRD (SUTURE) IMPLANT
SUTURE FIBERWR #2 38 T-5 BLUE (SUTURE) IMPLANT
TAPE FIBER 2MM 7IN #2 BLUE (SUTURE) ×2 IMPLANT
TOWEL OR 17X24 6PK STRL BLUE (TOWEL DISPOSABLE) ×2 IMPLANT
TOWEL OR NON WOVEN STRL DISP B (DISPOSABLE) ×2 IMPLANT
TUBE CONNECTING 20X1/4 (TUBING) IMPLANT
WAND STAR VAC 90 (SURGICAL WAND) ×2 IMPLANT
WATER STERILE IRR 1000ML POUR (IV SOLUTION) ×2 IMPLANT

## 2013-01-20 NOTE — Transfer of Care (Signed)
Immediate Anesthesia Transfer of Care Note  Patient: Lauren Lopez  Procedure(s) Performed: Procedure(s) (LRB): RIGHT ARTHROSCOPY SHOULDER DEBRIDEMENT LIMITED, ARTHROSCOPY SHOULDER DECOMPRESSION SUBACROMIAL PARTIAL ACROMIOPLASTY WITH CORACOACROMIAL RELEASE, ROTATOR CUFF REPAIR  (Right)  Patient Location: PACU  Anesthesia Type: General  Level of Consciousness:drowsy  Airway & Oxygen Therapy: Patient Spontanous Breathing and Patient connected to face mask oxygen, oral airway removed upon arrival in PACU.  Post-op Assessment: Report given to PACU RN and Post -op Vital signs reviewed and stable  Post vital signs: Reviewed and stable  Complications: No apparent anesthesia complications

## 2013-01-20 NOTE — Anesthesia Postprocedure Evaluation (Signed)
Anesthesia Post Note  Patient: Lauren Lopez  Procedure(s) Performed: Procedure(s) (LRB): RIGHT ARTHROSCOPY SHOULDER DEBRIDEMENT LIMITED, ARTHROSCOPY SHOULDER DECOMPRESSION SUBACROMIAL PARTIAL ACROMIOPLASTY WITH CORACOACROMIAL RELEASE, ROTATOR CUFF REPAIR  (Right)  Anesthesia type: General  Patient location: PACU  Post pain: Pain level controlled  Post assessment: Patient's Cardiovascular Status Stable  Last Vitals:  Filed Vitals:   01/20/13 1345  BP: 126/75  Pulse: 79  Temp:   Resp: 19    Post vital signs: Reviewed and stable  Level of consciousness: alert  Complications: No apparent anesthesia complications

## 2013-01-20 NOTE — H&P (Signed)
PREOPERATIVE H&P  Chief Complaint: RIGHT SHOULDER IMPINGEMENT SYNDROME-SHOULDER COMPLETE RUPTURE OF ROTATOR CUFF 726.2, 727.61  HPI: Lauren Lopez is a 62 y.o. female who presents for preoperative history and physical with a diagnosis of RIGHT SHOULDER IMPINGEMENT SYNDROME-SHOULDER COMPLETE RUPTURE OF ROTATOR CUFF 726.2, 727.61. Symptoms are rated as moderate to severe, and have been worsening.  This is significantly impairing activities of daily living.  She has elected for surgical management. She works International aid/development worker palms, and has difficulty lifting her arm up overhead, and difficulty with lifting, and significant night pain. She's had injections and exercises, as well as anti-inflammatories which she could not tolerate, and has elected for surgical management.  Past Medical History  Diagnosis Date  . Pneumonia 5/13  . Multiple allergies   . GERD (gastroesophageal reflux disease)   . Arthritis   . Bilateral carpal tunnel syndrome   . Wears glasses   . Wears dentures     upper  . Positive PPD    Past Surgical History  Procedure Laterality Date  . Bilateral salpingoophorectomy  11/28/2010    open laparoscopy with adhesiolysis also  . Knee arthroscopy  2005    rt  . Colonoscopy    . Carpal tunnel release  07/15/2012    Procedure: CARPAL TUNNEL RELEASE;  Surgeon: Wyn Forster., MD;  Location: [facility];  Service: Orthopedics;  Laterality: Left;   History   Social History  . Marital Status: Married    Spouse Name: N/A    Number of Children: N/A  . Years of Education: N/A   Social History Main Topics  . Smoking status: Never Smoker   . Smokeless tobacco: None  . Alcohol Use: No  . Drug Use: No  . Sexually Active:    Other Topics Concern  . None   Social History Narrative  . None   History reviewed. No pertinent family history. Allergies  Allergen Reactions  . Metronidazole Other (See Comments)    unknown   Prior to Admission medications    Medication Sig Start Date End Date Taking? Authorizing Provider  estrogens conjugated, synthetic B, (ENJUVIA) 0.45 MG tablet Take 0.45 mg by mouth daily.   Yes Historical Provider, MD  fexofenadine (ALLEGRA) 180 MG tablet Take 180 mg by mouth daily.   Yes Historical Provider, MD  MAGNESIUM PO Take 1 tablet by mouth daily.   Yes Historical Provider, MD  montelukast (SINGULAIR) 10 MG tablet Take 10 mg by mouth daily.   Yes Historical Provider, MD  RABEprazole (ACIPHEX) 20 MG tablet Take 20 mg by mouth daily.   Yes Historical Provider, MD  Red Yeast Rice Extract (RED YEAST RICE PO) Take 1 capsule by mouth daily.   Yes Historical Provider, MD  oxyCODONE-acetaminophen (PERCOCET/ROXICET) 5-325 MG per tablet 1 or 2 tabs every 4 hours as needed for pain 07/15/12   Marveen Reeks [name], PA     Positive ROS: All other systems have been reviewed and were otherwise negative with the exception of those mentioned in the HPI and as above.  Physical Exam: General: Alert, no acute distress Cardiovascular: No pedal edema Respiratory: No cyanosis, no use of accessory musculature GI: No organomegaly, abdomen is soft and non-tender Skin: No lesions in the area of chief complaint Neurologic: Sensation intact distally Psychiatric: Patient is competent for consent with normal mood and affect Lymphatic: No axillary or cervical lymphadenopathy  MUSCULOSKELETAL: Right shoulder has pain with active elevation, she is able to get her arm up to  about 170, but has moderately positive drop arm sign with weakness with supraspinatus testing.  Assessment: RIGHT SHOULDER IMPINGEMENT SYNDROME-SHOULDER COMPLETE RUPTURE OF ROTATOR CUFF 726.2, 727.61  Plan: Plan for Procedure(s): RIGHT ARTHROSCOPY SHOULDER DEBRIDEMENT LIMITED, ARTHROSCOPY SHOULDER DECOMPRESSION SUBACROMIAL PARTIAL ACROMIOPLASTY WITH CORACOACROMIAL RELEASE, ARTHROSCOPY SHOULDER WITH ROTATOR CUFF REPAIR   The risks benefits and alternatives were discussed with  the patient including but not limited to the risks of nonoperative treatment, versus surgical intervention including infection, bleeding, nerve injury,  blood clots, cardiopulmonary complications, morbidity, mortality, among others, and they were willing to proceed.   Kalina Morabito P, MD Cell [phone] Pager [phone]  01/20/2013 10:14 AM

## 2013-01-20 NOTE — Anesthesia Procedure Notes (Addendum)
Anesthesia Regional Block:  Interscalene brachial plexus block  Pre-Anesthetic Checklist: ,, timeout performed, Correct Patient, Correct Site, Correct Laterality, Correct Procedure, Correct Position, site marked, Risks and benefits discussed,  Surgical consent,  Pre-op evaluation,  At surgeon's request and post-op pain management  Laterality: Right  Prep: chloraprep       Needles:   Needle Type: Other     Needle Length: 9cm  Needle Gauge: 21    Additional Needles:  Procedures: ultrasound guided (picture in chart) Interscalene brachial plexus block Narrative:  Start time: 01/20/2013 9:10 AM End time: 01/20/2013 9:18 AM Injection made incrementally with aspirations every 5 mL.  Performed by: Personally  Anesthesiologist: Aldona Lento, MD  Additional Notes: Ultrasound guidance used to: id relevant anatomy, confirm needle position, local anesthetic spread, avoidance of vascular puncture. Picture saved. No complications. Block performed personally by Janetta Hora. Gelene Mink, MD    Interscalene brachial plexus block Procedure Name: Intubation Date/Time: 01/20/2013 10:30 AM Performed by: Norva Pavlov Pre-anesthesia Checklist: Patient identified, Emergency Drugs available, Suction available and Patient being monitored Patient Re-evaluated:Patient Re-evaluated prior to inductionOxygen Delivery Method: Circle System Utilized Preoxygenation: Pre-oxygenation with 100% oxygen Intubation Type: IV induction Ventilation: Mask ventilation without difficulty Laryngoscope Size: Mac and 3 Grade View: Grade I Tube type: Oral Number of attempts: 1 Airway Equipment and Method: stylet and LTA kit utilized Placement Confirmation: ETT inserted through vocal cords under direct vision and positive ETCO2 Secured at: 21 cm Tube secured with: Tape Dental Injury: Teeth and Oropharynx as per pre-operative assessment

## 2013-01-20 NOTE — Anesthesia Preprocedure Evaluation (Signed)
Anesthesia Evaluation  Patient identified by MRN, date of birth, ID band Patient awake    Reviewed: Allergy & Precautions, H&P , NPO status , Patient's Chart, lab work & pertinent test results, reviewed documented beta blocker date and time   Airway Mallampati: II TM Distance: >3 FB Neck ROM: full    Dental   Pulmonary pneumonia -, resolved,  breath sounds clear to auscultation        Cardiovascular hypertension, Rhythm:regular     Neuro/Psych  Neuromuscular disease negative neurological ROS  negative psych ROS   GI/Hepatic Neg liver ROS, GERD-  Medicated and Controlled,  Endo/Other  negative endocrine ROS  Renal/GU negative Renal ROS  negative genitourinary   Musculoskeletal   Abdominal   Peds  Hematology negative hematology ROS (+)   Anesthesia Other Findings See surgeon's H&P   Reproductive/Obstetrics negative OB ROS                           Anesthesia Physical Anesthesia Plan  ASA: II  Anesthesia Plan: General   Post-op Pain Management:    Induction: Intravenous  Airway Management Planned: Oral ETT  Additional Equipment:   Intra-op Plan:   Post-operative Plan: Extubation in OR  Informed Consent: I have reviewed the patients History and Physical, chart, labs and discussed the procedure including the risks, benefits and alternatives for the proposed anesthesia with the patient or authorized representative who has indicated his/her understanding and acceptance.   Dental Advisory Given  Plan Discussed with: CRNA and Surgeon  Anesthesia Plan Comments:         Anesthesia Quick Evaluation

## 2013-01-20 NOTE — Progress Notes (Signed)
Assisted Dr. Frederick with right, ultrasound guided, interscalene  block. Side rails up, monitors on throughout procedure. See vital signs in flow sheet. Tolerated Procedure well. 

## 2013-01-20 NOTE — Op Note (Signed)
01/20/2013  12:06 PM  PATIENT:  Lauren Lopez    PRE-OPERATIVE DIAGNOSIS:  RIGHT SHOULDER IMPINGEMENT SYNDROME-SHOULDER COMPLETE RUPTURE OF ROTATOR CUFF 726.2, 727.61  POST-OPERATIVE DIAGNOSIS:  Same  PROCEDURE:  RIGHT ARTHROSCOPY SHOULDER DEBRIDEMENT LIMITED, ARTHROSCOPY SHOULDER DECOMPRESSION SUBACROMIAL PARTIAL ACROMIOPLASTY WITH CORACOACROMIAL RELEASE, ROTATOR CUFF REPAIR   SURGEON:  Eulas Post, MD  PHYSICIAN ASSISTANT: Janace Litten, OPA-C, present and scrubbed throughout the case, critical for completion in a timely fashion, and for retraction, instrumentation, and closure.  ANESTHESIA:   General  PREOPERATIVE INDICATIONS:  Lauren Lopez is a  62 y.o. female with a diagnosis of RIGHT SHOULDER IMPINGEMENT SYNDROME-SHOULDER COMPLETE RUPTURE OF ROTATOR CUFF 726.2, 727.61 who failed conservative measures and elected for surgical management.    The risks benefits and alternatives were discussed with the patient preoperatively including but not limited to the risks of infection, bleeding, nerve injury, cardiopulmonary complications, the need for revision surgery, among others, and the patient was willing to proceed.  OPERATIVE IMPLANTS: Arthrex 4.75 mm swivel lock x1 for the supraspinatus  OPERATIVE FINDINGS: The glenohumeral articular joint surfaces appeared in good condition. The superior labrum was normal. The biceps was normal. The subscapularis was normal. The undersurface of the supraspinatus did have about a 20% fraying, but I did not appreciate a full-thickness tear based on the articular surface. The majority of the tear was bursal. The infraspinatus and subscapularis was intact. The biceps pulley was intact. There was not too much in the way of bursitis, but there was a CA ligament fraying consistent with impingement syndrome. There was a cleavage tear of the junction between the supraspinatus and the infraspinatus consistent with preoperative MRI imaging. This was about  70% through the tendon from the bursal side. It was essentially a high-grade tendinopathy with a cleavage tear that was incomplete.   OPERATIVE PROCEDURE: The patient was brought to the operating room and placed in the supine position. General anesthesia was administered. IV antibiotics were given. Examination under anesthesia did demonstrate some stiffness consistent with a mild adhesive capsulitis. This was manipulated, and there was some lysis of adhesions.  The right upper extremity was prepped and draped in usual sterile fashion. The patient was in a semilateral decubitus position with all bony prominences padded. Time out was performed. Diagnostic arthroscopy carried out the above-named findings. The arthroscopic shaver was used to debride a small amount of fraying of the glenohumeral articular cartilage, but on the glenoid, and I also debrided the undersurface of the rotator cuff.  I then went to the subacromial space. A small bursectomy was carried out, and I exposed the tendon, and it was found to have a high-grade tendinopathy with cleavage tear at the junction of the infraspinatus and supraspinatus. This was taken down using the shaver. The bone edges were exposed. I performed a light tubercle plasty using a 4-0 bur, and also used the bur to perform an acromioplasty. I did release the CA ligament. I did not expose the a.c. joint.  Once this had been prepared, and used a combination of the scorpion, as well as the BirdBeak, to pass an inverted fiber tape through the tendon. I placed a superior cannula, and then punched and placed the fiber tape through a swivel lock. Excellent fixation was achieved. The tendon was reapposed to bone. I confirmed the acromioplasty from the lateral view, removed the instruments, drained the shoulder, and repaired the portals with Monocryl followed by Steri-Strips and sterile gauze. She was awakened and returned back in stable  and satisfactory condition. There were no  complications. She tolerated the procedure well.

## 2013-01-23 ENCOUNTER — Encounter (HOSPITAL_BASED_OUTPATIENT_CLINIC_OR_DEPARTMENT_OTHER): Payer: Self-pay | Admitting: Orthopedic Surgery

## 2013-10-15 ENCOUNTER — Encounter: Payer: Self-pay | Admitting: Internal Medicine

## 2013-10-15 DIAGNOSIS — E782 Mixed hyperlipidemia: Secondary | ICD-10-CM | POA: Insufficient documentation

## 2013-10-15 DIAGNOSIS — I10 Essential (primary) hypertension: Secondary | ICD-10-CM

## 2013-10-15 DIAGNOSIS — E559 Vitamin D deficiency, unspecified: Secondary | ICD-10-CM

## 2013-10-15 DIAGNOSIS — J45909 Unspecified asthma, uncomplicated: Secondary | ICD-10-CM

## 2013-10-15 DIAGNOSIS — K219 Gastro-esophageal reflux disease without esophagitis: Secondary | ICD-10-CM

## 2013-10-15 DIAGNOSIS — E785 Hyperlipidemia, unspecified: Secondary | ICD-10-CM

## 2013-10-15 DIAGNOSIS — D649 Anemia, unspecified: Secondary | ICD-10-CM

## 2013-10-16 ENCOUNTER — Ambulatory Visit: Payer: BC Managed Care – PPO | Admitting: Emergency Medicine

## 2013-10-16 ENCOUNTER — Encounter: Payer: Self-pay | Admitting: Emergency Medicine

## 2013-10-16 VITALS — BP 112/80 | HR 64 | Temp 98.2°F | Resp 18 | Wt 197.0 lb

## 2013-10-16 DIAGNOSIS — R7309 Other abnormal glucose: Secondary | ICD-10-CM

## 2013-10-16 DIAGNOSIS — J40 Bronchitis, not specified as acute or chronic: Secondary | ICD-10-CM

## 2013-10-16 DIAGNOSIS — I10 Essential (primary) hypertension: Secondary | ICD-10-CM

## 2013-10-16 DIAGNOSIS — E782 Mixed hyperlipidemia: Secondary | ICD-10-CM

## 2013-10-16 LAB — HEPATIC FUNCTION PANEL
ALT: 18 U/L (ref 0–35)
AST: 24 U/L (ref 0–37)
Albumin: 4 g/dL (ref 3.5–5.2)
Bilirubin, Direct: 0.1 mg/dL (ref 0.0–0.3)
Total Bilirubin: 0.5 mg/dL (ref 0.3–1.2)

## 2013-10-16 LAB — LIPID PANEL
Cholesterol: 193 mg/dL (ref 0–200)
Triglycerides: 143 mg/dL (ref <150)
VLDL: 29 mg/dL (ref 0–40)

## 2013-10-16 LAB — BASIC METABOLIC PANEL WITH GFR
CO2: 31 mEq/L (ref 19–32)
Calcium: 10.1 mg/dL (ref 8.4–10.5)
Creat: 1.19 mg/dL — ABNORMAL HIGH (ref 0.50–1.10)
GFR, Est African American: 57 mL/min — ABNORMAL LOW
Potassium: 3.9 mEq/L (ref 3.5–5.3)

## 2013-10-16 LAB — CBC WITH DIFFERENTIAL/PLATELET
Basophils Absolute: 0 10*3/uL (ref 0.0–0.1)
HCT: 38.9 % (ref 36.0–46.0)
Hemoglobin: 12.5 g/dL (ref 12.0–15.0)
Lymphocytes Relative: 53 % — ABNORMAL HIGH (ref 12–46)
Lymphs Abs: 4.4 10*3/uL — ABNORMAL HIGH (ref 0.7–4.0)
Monocytes Absolute: 0.5 10*3/uL (ref 0.1–1.0)
Monocytes Relative: 6 % (ref 3–12)
Neutro Abs: 3.2 10*3/uL (ref 1.7–7.7)
RBC: 4.75 MIL/uL (ref 3.87–5.11)
WBC: 8.2 10*3/uL (ref 4.0–10.5)

## 2013-10-16 LAB — HEMOGLOBIN A1C: Mean Plasma Glucose: 131 mg/dL — ABNORMAL HIGH (ref <117)

## 2013-10-16 MED ORDER — BENZONATATE 100 MG PO CAPS: 100.0000 mg | ORAL_CAPSULE | Freq: Three times a day (TID) | ORAL | Status: DC | PRN

## 2013-10-16 MED ORDER — PREDNISONE 10 MG PO TABS: ORAL_TABLET | ORAL | Status: DC

## 2013-10-16 MED ORDER — AZITHROMYCIN 250 MG PO TABS: ORAL_TABLET | ORAL | Status: AC

## 2013-10-16 NOTE — Patient Instructions (Signed)
Diabetes Meal Planning Guide The diabetes meal planning guide is a tool to help you plan your meals and snacks. It is important for people with diabetes to manage their blood glucose (sugar) levels. Choosing the right foods and the right amounts throughout your day will help control your blood glucose. Eating right can even help you improve your blood pressure and reach or maintain a healthy weight. CARBOHYDRATE COUNTING MADE EASY When you eat carbohydrates, they turn to sugar. This raises your blood glucose level. Counting carbohydrates can help you control this level so you feel better. When you plan your meals by counting carbohydrates, you can have more flexibility in what you eat and balance your medicine with your food intake. Carbohydrate counting simply means adding up the total amount of carbohydrate grams in your meals and snacks. Try to eat about the same amount at each meal. Foods with carbohydrates are listed below. Each portion below is 1 carbohydrate serving or 15 grams of carbohydrates. Ask your dietician how many grams of carbohydrates you should eat at each meal or snack. Grains and Starches  1 slice bread.   English muffin or hotdog/hamburger bun.   cup cold cereal (unsweetened).   cup cooked pasta or rice.   cup starchy vegetables (corn, potatoes, peas, beans, winter squash).  1 tortilla (6 inches).   bagel.  1 waffle or pancake (size of a CD).   cup cooked cereal.  4 to 6 small crackers. *Whole grain is recommended. Fruit  1 cup fresh unsweetened berries, melon, papaya, pineapple.  1 small fresh fruit.   banana or mango.   cup fruit juice (4 oz unsweetened).   cup canned fruit in natural juice or water.  2 tbs dried fruit.  12 to 15 grapes or cherries. Milk and Yogurt  1 cup fat-free or 1% milk.  1 cup soy milk.  6 oz light yogurt with sugar-free sweetener.  6 oz low-fat soy yogurt.  6 oz plain yogurt. Vegetables  1 cup raw or  cup  cooked is counted as 0 carbohydrates or a "free" food.  If you eat 3 or more servings at 1 meal, count them as 1 carbohydrate serving. Other Carbohydrates   oz chips or pretzels.   cup ice cream or frozen yogurt.   cup sherbet or sorbet.  2 inch square cake, no frosting.  1 tbs honey, sugar, jam, jelly, or syrup.  2 small cookies.  3 squares of graham crackers.  3 cups popcorn.  6 crackers.  1 cup broth-based soup.  Count 1 cup casserole or other mixed foods as 2 carbohydrate servings.  Foods with less than 20 calories in a serving may be counted as 0 carbohydrates or a "free" food. You may want to purchase a book or computer software that lists the carbohydrate gram counts of different foods. In addition, the nutrition facts panel on the labels of the foods you eat are a good source of this information. The label will tell you how big the serving size is and the total number of carbohydrate grams you will be eating per serving. Divide this number by 15 to obtain the number of carbohydrate servings in a portion. Remember, 1 carbohydrate serving equals 15 grams of carbohydrate. SERVING SIZES Measuring foods and serving sizes helps you make sure you are getting the right amount of food. The list below tells how big or small some common serving sizes are.  1 oz.........4 stacked dice.  3 oz.........Deck of cards.  1 tsp........Tip   of little finger.  1 tbs......Marland KitchenMarland KitchenThumb.  2 tbs.......Marland KitchenGolf ball.   cup......Marland KitchenHalf of a fist.  1 cup.......Marland KitchenA fist. SAMPLE DIABETES MEAL PLAN Below is a sample meal plan that includes foods from the grain and starches, dairy, vegetable, fruit, and meat groups. A dietician can individualize a meal plan to fit your calorie needs and tell you the number of servings needed from each food group. However, controlling the total amount of carbohydrates in your meal or snack is more important than making sure you include all of the food groups at every  meal. You may interchange carbohydrate containing foods (dairy, starches, and fruits). The meal plan below is an example of a 2000 calorie diet using carbohydrate counting. This meal plan has 17 carbohydrate servings. Breakfast  1 cup oatmeal (2 carb servings).   cup light yogurt (1 carb serving).  1 cup blueberries (1 carb serving).   cup almonds. Snack  1 large apple (2 carb servings).  1 low-fat string cheese stick. Lunch  Chicken breast salad.  1 cup spinach.   cup chopped tomatoes.  2 oz chicken breast, sliced.  2 tbs low-fat [place] dressing.  12 whole-wheat crackers (2 carb servings).  12 to 15 grapes (1 carb serving).  1 cup low-fat milk (1 carb serving). Snack  1 cup carrots.   cup hummus (1 carb serving). Dinner  3 oz broiled salmon.  1 cup brown rice (3 carb servings). Snack  1  cups steamed broccoli (1 carb serving) drizzled with 1 tsp olive oil and lemon juice.  1 cup light pudding (2 carb servings). DIABETES MEAL PLANNING WORKSHEET Your dietician can use this worksheet to help you decide how many servings of foods and what types of foods are right for you.  BREAKFAST Food Group and Servings / Carb Servings Grain/Starches __________________________________ Dairy __________________________________________ Vegetable ______________________________________ Fruit ___________________________________________ Meat __________________________________________ Fat ____________________________________________ LUNCH Food Group and Servings / Carb Servings Grain/Starches ___________________________________ Dairy ___________________________________________ Fruit ____________________________________________ Meat ___________________________________________ Fat _____________________________________________ Laural Golden Food Group and Servings / Carb Servings Grain/Starches ___________________________________ Dairy  ___________________________________________ Fruit ____________________________________________ Meat ___________________________________________ Fat _____________________________________________ SNACKS Food Group and Servings / Carb Servings Grain/Starches ___________________________________ Dairy ___________________________________________ Vegetable _______________________________________ Fruit ____________________________________________ Meat ___________________________________________ Fat _____________________________________________ DAILY TOTALS Starches _________________________ Vegetable ________________________ Fruit ____________________________ Dairy ____________________________ Meat ____________________________ Fat ______________________________ Document Released: 08/13/2005 Document Revised: 02/08/2012 Document Reviewed: 06/24/2009 [employer] Patient Information 2014 [place]. Diabetes and Exercise Diabetes mellitus is a common, chronic disease, in which the pancreas is unable to adequately control blood glucose (sugar) levels. There are 2 types of diabetes. Type 1 diabetes patients are unable to produce insulin, a hormone that causes sugar in the blood to be stored in the body. People with type 1 diabetes may compensate by giving themselves injections of insulin. Type 2 diabetes involves not producing adequate amounts of insulin to control blood glucose levels. People with type 2 diabetes control their blood glucose by monitoring their food intake or by taking medicine. Exercise is an important part of diabetes treatment. During exercise, the muscles use a greater amount of glucose from the blood for energy. This lowers your blood glucose, which is the same effect you would get from taking insulin. It has been shown that endurance athletes are more sensitive to insulin than inactive people. SYMPTOMS  Many people with a mild case of diabetes have no symptoms. However, if left  uncontrolled, diabetes can lead to several complications that could be prevented with treatment of the disease. General symptoms of diabetes include:   Frequent  urination (polyuria).  Frequent thirst and drinking (polydipsia).  Increased food consumption (polyphagia).  Fatigue.  Poor exercise performance.  Blurred vision.  Inflammation of the vagina (vaginitis) caused by fungal infections.  Skin infections (uncommon).  Numbness in the feet,caused by nerve injury.  Kidney disease. CAUSES  The cause of most cases of diabetes is unknown. In children, diabetes is often due to an autoimmune response to the cells in the pancreas that make insulin. It is also linked with other diseases, such as cystic fibrosis. Diabetes may have a genetic link. PREVENTION  Athletes should strive to begin exercise with blood glucose in a well-controlled state.  Feet should always be kept clean and dry.  Activities in which low blood sugar levels cannot be treated easily (scuba diving, rock climbing, swimming) should be avoided.  Anticipate alterations in diet or training to avoid low blood sugar (hypoglycemia) and high blood sugar (hyperglycemia).  Athletes should try to increase sugar consumption after strenuous exercise to avoid hypoglycemia.  Short-acting insulin should not be injected into an actively exercising muscle. The athlete should rest the injection site for about 1 hour after exercise.  Patients with diabetes should get routine checkups of the feet to prevent complications. PROGNOSIS  Exercise provides many benefits to the person with diabetes:   Reduced body fat.  Lower blood pressure.  Often, reduced need for medicines.  Improved exercise tolerance.  Lower insulin levels.  Weight loss.  Improved lipid profile (decreased cholesterol and low-density lipoproteins). RELATED COMPLICATIONS  If performed incorrectly, exercise can result in complications of diabetes:   Poor  control of blood sugar, when exercise is performed at the wrong time.  Increase in renal disease, from loss of body fluids (dehydration).  Increased risk of nerve injury (neuropathy) when performing exercises that increase foot injury.  Increased risk of eye problems when performingactivities that involve breath holding or lowering or jarring the head.  Increased risk of sudden death from exercise in patients with heart disease.  Worsening of hypertension with heavy lifting (more than 10 lb/4.5 kg). Altered blood glucose and insulin dose as a result of mild illness that produces loss of appetite.  Altered uptake of insulin after injection when insulin injection site is changed. NOTE: Exercise can lower blood glucose effectively, but the effects are short-lasting (no more than a couple of days). Exercise has been shown to improve your sensitivity to insulin. This may alter how your body responds to a given dose of injected insulin. It is important for every patient with diabetes to know how his or her body may react to exercise, and to adjust insulin dosages accordingly. TREATMENT  Eat about 1 to 3 hours before exercise.  Check blood glucose immediately before and after exercise.  Stop exercise if blood glucose is more than 250 mg/dL.  Stop exercise if blood glucose is less than 100 mg/dL.  Do not exercise within 1 hour of an insulin injection.  Be prepared to treat low blood glucose while exercising. Keep some sugar product with you, such as a candy bar.  For prolonged exercise, use a sports drink to maintain your glucose level.  Replace used up glucose in the body after exercise.  Consume fluids during and after exercise to avoid dehydration. SEEK MEDICAL CARE IF:  You have vision changes after a run.  You notice a loss of sensation in your feet after exercise.  You have increased numbness, tingling, or pins and needles sensations after exercise.  You have chest pain during  or after exercise.  You have a fast, irregular heartbeat (palpitations) during or after exercise.  Your exercise tolerance gets worse.  You have fainting or dizzy spells for brief periods during or after exercise. Document Released: 11/16/2005 Document Revised: 02/08/2012 Document Reviewed: 02/28/2009 [facility] Patient Information 2014 [place], [place]. Acute Bronchitis Bronchitis is when the airways that extend from the windpipe into the lungs get red, puffy, and painful (inflamed). Bronchitis often causes thick spit (mucus) to develop. This leads to a cough. A cough is the most common symptom of bronchitis. In acute bronchitis, the condition usually begins suddenly and goes away over time (usually in 2 weeks). Smoking, allergies, and asthma can make bronchitis worse. Repeated episodes of bronchitis may cause more lung problems. HOME CARE  Rest.  Drink enough fluids to keep your pee (urine) clear or pale yellow (unless you need to limit fluids as told by your doctor).  Only take over-the-counter or prescription medicines as told by your doctor.  Avoid smoking and secondhand smoke. These can make bronchitis worse. If you are a smoker, think about using nicotine gum or skin patches. Quitting smoking will help your lungs heal faster.  Reduce the chance of getting bronchitis again by:  Washing your hands often.  Avoiding people with cold symptoms.  Trying not to touch your hands to your mouth, nose, or eyes.  Follow up with your doctor as told. GET HELP IF: Your symptoms do not improve after 1 week of treatment. Symptoms include:  Cough.  Fever.  Coughing up thick spit.  Body aches.  Chest congestion.  Chills.  Shortness of breath.  Sore throat. GET HELP RIGHT AWAY IF:   You have an increased fever.  You have chills.  You have severe shortness of breath.  You have bloody thick spit (sputum).  You throw up (vomit) often.  You lose too much body fluid  (dehydration).  You have a severe headache.  You faint. MAKE SURE YOU:   Understand these instructions.  Will watch your condition.  Will get help right away if you are not doing well or get worse. Document Released: 05/04/2008 Document Revised: 07/19/2013 Document Reviewed: 05/09/2013 [facility] Patient Information 2014 [place], [place].

## 2013-10-17 ENCOUNTER — Telehealth: Payer: Self-pay | Admitting: *Deleted

## 2013-10-17 LAB — INSULIN, FASTING: Insulin fasting, serum: 30 u[IU]/mL — ABNORMAL HIGH (ref 3–28)

## 2013-10-17 NOTE — Telephone Encounter (Signed)
Message copied by Nicholaus Corolla A on Tue Oct 17, 2013 12:09 PM ------      Message from: [place], [place] R      Created: Tue Oct 17, 2013  4:33 AM       Increase H2O,continue ABX AD. A1c/ insulin elevated need wt loss and healthier diet. Rest ok continue RX/ Vits same ------

## 2013-10-17 NOTE — Progress Notes (Signed)
Subjective:    Patient ID: Lauren Lopez, female    DOB: [birth year], 62 y.o.   MRN: [id number]  HPI Comments: 62 yo female presents for 3 month F/U for HTN, Cholesterol, Pre-Dm, D. Deficient. She has been exercising with work only. She is trying to decrease fried foods and stop eating as late. She is trying to lose wt. BP good when she checks. She has noticed increased wheezing x 2 weeks and has restarted Alb inhaler 1-2 x QD without relief and now has green productive cough and sinus drainage.   Hypertension  Hyperlipidemia  Gastrophageal Reflux She complains of coughing and wheezing.    Current Outpatient Prescriptions on File Prior to Visit  Medication Sig Dispense Refill  . budesonide-formoterol (SYMBICORT) 160-4.5 MCG/ACT inhaler Inhale 2 puffs into the lungs 2 (two) times daily.      . cholecalciferol (VITAMIN D) 1000 UNITS tablet Take 1,000 Units by mouth daily.      . cyanocobalamin 100 MCG tablet Take 100 mcg by mouth daily.      Marland Kitchen estrogens conjugated, synthetic B, (ENJUVIA) 0.45 MG tablet Take 0.45 mg by mouth daily.      . fexofenadine (ALLEGRA) 180 MG tablet Take 180 mg by mouth daily.      . hydrochlorothiazide (HYDRODIURIL) 25 MG tablet Take 25 mg by mouth daily.      Marland Kitchen MAGNESIUM PO Take 1 tablet by mouth daily.      . montelukast (SINGULAIR) 10 MG tablet Take 10 mg by mouth daily.      . Multiple Vitamins-Minerals (MULTIVITAMIN WITH MINERALS) tablet Take 1 tablet by mouth daily.      . RABEprazole (ACIPHEX) 20 MG tablet Take 20 mg by mouth daily.      . Red Yeast Rice Extract (RED YEAST RICE PO) Take 1 capsule by mouth daily.      . sennosides-docusate sodium (SENOKOT-S) 8.6-50 MG tablet Take 1 tablet by mouth daily.  30 tablet  1   No current facility-administered medications on file prior to visit.   ALLERGIES Metronidazole and Ppd  Past Medical History  Diagnosis Date  . Pneumonia 5/13  . Multiple allergies   . GERD (gastroesophageal reflux disease)   .  Arthritis   . Bilateral carpal tunnel syndrome   . Wears glasses   . Wears dentures     upper  . Positive PPD   . Right rotator cuff tear 01/20/2013  . Hypertension   . Hyperlipidemia   . Unspecified vitamin D deficiency   . Asthma   . Anemia      Review of Systems  HENT: Positive for congestion, postnasal drip and sinus pressure.   Respiratory: Positive for cough and wheezing.   All other systems reviewed and are negative.    BP 112/80  Pulse 64  Temp(Src) 98.2 F (36.8 C) (Temporal)  Resp 18  Wt 197 lb (89.359 kg)     Objective:   Physical Exam  Nursing note and vitals reviewed. Constitutional: She is oriented to person, place, and time. She appears well-developed and well-nourished.  HENT:  Head: Normocephalic and atraumatic.  Right Ear: External ear normal.  Left Ear: External ear normal.  Nose: Nose normal.  TMs yellow , maxillary tenderness  Eyes: Conjunctivae are normal. Pupils are equal, round, and reactive to light.  Neck: Normal range of motion. Neck supple. No thyromegaly present.  Cardiovascular: Normal rate, regular rhythm, normal heart sounds and intact distal pulses.   Pulmonary/Chest: Effort normal. She has  wheezes.  Abdominal: Soft.  Musculoskeletal: Normal range of motion.  Lymphadenopathy:    She has cervical adenopathy.  Neurological: She is alert and oriented to person, place, and time.  Skin: Skin is warm and dry.  Psychiatric: She has a normal mood and affect. Judgment normal.          Assessment & Plan:  1. 3 month F/U for HTN, Cholesterol, Pre-Dm, D. Deficient check labs, continue to improve diet and exercise and wt loss 2. Sinusitus/ bronchitis- Increase Albuterol to TID, restart Allegra AD, Zpak, Pred 10 mg DP, and Tessalon perles AD. Increase H2O w/c if no change.

## 2013-10-24 NOTE — Telephone Encounter (Signed)
Spoke with patient about recent labs and instructions.

## 2014-01-18 ENCOUNTER — Ambulatory Visit: Payer: Self-pay | Admitting: Emergency Medicine

## 2014-01-23 ENCOUNTER — Ambulatory Visit: Payer: Self-pay | Admitting: Emergency Medicine

## 2014-02-06 ENCOUNTER — Ambulatory Visit: Payer: Self-pay | Admitting: Emergency Medicine

## 2014-02-12 ENCOUNTER — Encounter: Payer: Self-pay | Admitting: Emergency Medicine

## 2014-02-12 ENCOUNTER — Ambulatory Visit (INDEPENDENT_AMBULATORY_CARE_PROVIDER_SITE_OTHER): Payer: BC Managed Care – PPO | Admitting: Emergency Medicine

## 2014-02-12 VITALS — BP 116/60 | HR 78 | Temp 98.2°F | Resp 18 | Ht 62.5 in | Wt 198.0 lb

## 2014-02-12 DIAGNOSIS — R059 Cough, unspecified: Secondary | ICD-10-CM

## 2014-02-12 DIAGNOSIS — R32 Unspecified urinary incontinence: Secondary | ICD-10-CM

## 2014-02-12 DIAGNOSIS — E782 Mixed hyperlipidemia: Secondary | ICD-10-CM

## 2014-02-12 DIAGNOSIS — R5381 Other malaise: Secondary | ICD-10-CM

## 2014-02-12 DIAGNOSIS — Z1331 Encounter for screening for depression: Secondary | ICD-10-CM

## 2014-02-12 DIAGNOSIS — N3000 Acute cystitis without hematuria: Secondary | ICD-10-CM

## 2014-02-12 DIAGNOSIS — E559 Vitamin D deficiency, unspecified: Secondary | ICD-10-CM

## 2014-02-12 DIAGNOSIS — M549 Dorsalgia, unspecified: Secondary | ICD-10-CM

## 2014-02-12 DIAGNOSIS — J309 Allergic rhinitis, unspecified: Secondary | ICD-10-CM

## 2014-02-12 DIAGNOSIS — R05 Cough: Secondary | ICD-10-CM

## 2014-02-12 DIAGNOSIS — M25559 Pain in unspecified hip: Secondary | ICD-10-CM

## 2014-02-12 DIAGNOSIS — R5383 Other fatigue: Secondary | ICD-10-CM

## 2014-02-12 DIAGNOSIS — R7309 Other abnormal glucose: Secondary | ICD-10-CM

## 2014-02-12 DIAGNOSIS — I10 Essential (primary) hypertension: Secondary | ICD-10-CM

## 2014-02-12 DIAGNOSIS — Z79899 Other long term (current) drug therapy: Secondary | ICD-10-CM

## 2014-02-12 MED ORDER — PREDNISONE 10 MG PO TABS: ORAL_TABLET | ORAL | Status: DC

## 2014-02-12 NOTE — Patient Instructions (Signed)
Allergic Rhinitis Allergic rhinitis is when the mucous membranes in the nose respond to allergens. Allergens are particles in the air that cause your body to have an allergic reaction. This causes you to release allergic antibodies. Through a chain of events, these eventually cause you to release histamine into the blood stream. Although meant to protect the body, it is this release of histamine that causes your discomfort, such as frequent sneezing, congestion, and an itchy, runny nose.  CAUSES  Seasonal allergic rhinitis (hay fever) is caused by pollen allergens that may come from grasses, trees, and weeds. Year-round allergic rhinitis (perennial allergic rhinitis) is caused by allergens such as house dust mites, pet dander, and mold spores.  SYMPTOMS   Nasal stuffiness (congestion).  Itchy, runny nose with sneezing and tearing of the eyes. DIAGNOSIS  Your health care provider can help you determine the allergen or allergens that trigger your symptoms. If you and your health care provider are unable to determine the allergen, skin or blood testing may be used. TREATMENT  Allergic Rhinitis does not have a cure, but it can be controlled by:  Medicines and allergy shots (immunotherapy).  Avoiding the allergen. Hay fever may often be treated with antihistamines in pill or nasal spray forms. Antihistamines block the effects of histamine. There are over-the-counter medicines that may help with nasal congestion and swelling around the eyes. Check with your health care provider before taking or giving this medicine.  If avoiding the allergen or the medicine prescribed do not work, there are many new medicines your health care provider can prescribe. Stronger medicine may be used if initial measures are ineffective. Desensitizing injections can be used if medicine and avoidance does not work. Desensitization is when a patient is given ongoing shots until the body becomes less sensitive to the allergen.  Make sure you follow up with your health care provider if problems continue. HOME CARE INSTRUCTIONS It is not possible to completely avoid allergens, but you can reduce your symptoms by taking steps to limit your exposure to them. It helps to know exactly what you are allergic to so that you can avoid your specific triggers. SEEK MEDICAL CARE IF:   You have a fever.  You develop a cough that does not stop easily (persistent).  You have shortness of breath.  You start wheezing.  Symptoms interfere with normal daily activities. Document Released: 08/11/2001 Document Revised: 09/06/2013 Document Reviewed: 07/24/2013 [facility] Patient Information 2014 [place]. Sciatica with Rehab The sciatic nerve runs from the back down the leg and is responsible for sensation and control of the muscles in the back (posterior) side of the thigh, lower leg, and foot. Sciatica is a condition that is characterized by inflammation of this nerve.  SYMPTOMS   Signs of nerve damage, including numbness and/or weakness along the posterior side of the lower extremity.  Pain in the back of the thigh that may also travel down the leg.  Pain that worsens when sitting for long periods of time.  Occasionally, pain in the back or buttock. CAUSES  Inflammation of the sciatic nerve is the cause of sciatica. The inflammation is due to something irritating the nerve. Common sources of irritation include:  Sitting for long periods of time.  Direct trauma to the nerve.  Arthritis of the spine.  Herniated or ruptured disk.  Slipping of the vertebrae (spondylolithesis)  Pressure from soft tissues, such as muscles or ligament-like tissue (fascia). RISK INCREASES WITH:  Sports that place pressure or stress  on the spine (football or weightlifting).  Poor strength and flexibility.  Failure to warm-up properly before activity.  Family history of low back pain or disk disorders.  Previous back injury or  surgery.  Poor body mechanics, especially when lifting, or poor posture. PREVENTION   Warm up and stretch properly before activity.  Maintain physical fitness:  Strength, flexibility, and endurance.  Cardiovascular fitness.  Learn and use proper technique, especially with posture and lifting. When possible, have coach correct improper technique.  Avoid activities that place stress on the spine. PROGNOSIS If treated properly, then sciatica usually resolves within 6 weeks. However, occasionally surgery is necessary.  RELATED COMPLICATIONS   Permanent nerve damage, including pain, numbness, tingle, or weakness.  Chronic back pain.  Risks of surgery: infection, bleeding, nerve damage, or damage to surrounding tissues. TREATMENT Treatment initially involves resting from any activities that aggravate your symptoms. The use of ice and medication may help reduce pain and inflammation. The use of strengthening and stretching exercises may help reduce pain with activity. These exercises may be performed at home or with referral to a therapist. A therapist may recommend further treatments, such as transcutaneous electronic nerve stimulation (TENS) or ultrasound. Your caregiver may recommend corticosteroid injections to help reduce inflammation of the sciatic nerve. If symptoms persist despite non-surgical (conservative) treatment, then surgery may be recommended. MEDICATION  If pain medication is necessary, then nonsteroidal anti-inflammatory medications, such as aspirin and ibuprofen, or other minor pain relievers, such as acetaminophen, are often recommended.  Do not take pain medication for 7 days before surgery.  Prescription pain relievers may be given if deemed necessary by your caregiver. Use only as directed and only as much as you need.  Ointments applied to the skin may be helpful.  Corticosteroid injections may be given by your caregiver. These injections should be reserved for  the most serious cases, because they may only be given a certain number of times. HEAT AND COLD  Cold treatment (icing) relieves pain and reduces inflammation. Cold treatment should be applied for 10 to 15 minutes every 2 to 3 hours for inflammation and pain and immediately after any activity that aggravates your symptoms. Use ice packs or massage the area with a piece of ice (ice massage).  Heat treatment may be used prior to performing the stretching and strengthening activities prescribed by your caregiver, physical therapist, or athletic trainer. Use a heat pack or soak the injury in warm water. SEEK MEDICAL CARE IF:  Treatment seems to offer no benefit, or the condition worsens.  Any medications produce adverse side effects. EXERCISES  RANGE OF MOTION (ROM) AND STRETCHING EXERCISES - Sciatica Most people with sciatic will find that their symptoms worsen with either excessive bending forward (flexion) or arching at the low back (extension). The exercises which will help resolve your symptoms will focus on the opposite motion. Your physician, physical therapist or athletic trainer will help you determine which exercises will be most helpful to resolve your low back pain. Do not complete any exercises without first consulting with your clinician. Discontinue any exercises which worsen your symptoms until you speak to your clinician. If you have pain, numbness or tingling which travels down into your buttocks, leg or foot, the goal of the therapy is for these symptoms to move closer to your back and eventually resolve. Occasionally, these leg symptoms will get better, but your low back pain may worsen; this is typically an indication of progress in your rehabilitation. Be certain to  be very alert to any changes in your symptoms and the activities in which you participated in the 24 hours prior to the change. Sharing this information with your clinician will allow him/her to most efficiently treat your  condition. These exercises may help you when beginning to rehabilitate your injury. Your symptoms may resolve with or without further involvement from your physician, physical therapist or athletic trainer. While completing these exercises, remember:   Restoring tissue flexibility helps normal motion to return to the joints. This allows healthier, less painful movement and activity.  An effective stretch should be held for at least 30 seconds.  A stretch should never be painful. You should only feel a gentle lengthening or release in the stretched tissue. FLEXION RANGE OF MOTION AND STRETCHING EXERCISES: STRETCH  Flexion, Single Knee to Chest   Lie on a firm bed or floor with both legs extended in front of you.  Keeping one leg in contact with the floor, bring your opposite knee to your chest. Hold your leg in place by either grabbing behind your thigh or at your knee.  Pull until you feel a gentle stretch in your low back. Hold __________ seconds.  Slowly release your grasp and repeat the exercise with the opposite side. Repeat __________ times. Complete this exercise __________ times per day.  STRETCH  Flexion, Double Knee to Chest  Lie on a firm bed or floor with both legs extended in front of you.  Keeping one leg in contact with the floor, bring your opposite knee to your chest.  Tense your stomach muscles to support your back and then lift your other knee to your chest. Hold your legs in place by either grabbing behind your thighs or at your knees.  Pull both knees toward your chest until you feel a gentle stretch in your low back. Hold __________ seconds.  Tense your stomach muscles and slowly return one leg at a time to the floor. Repeat __________ times. Complete this exercise __________ times per day.  STRETCH  Low Trunk Rotation   Lie on a firm bed or floor. Keeping your legs in front of you, bend your knees so they are both pointed toward the ceiling and your feet are  flat on the floor.  Extend your arms out to the side. This will stabilize your upper body by keeping your shoulders in contact with the floor.  Gently and slowly drop both knees together to one side until you feel a gentle stretch in your low back. Hold for __________ seconds.  Tense your stomach muscles to support your low back as you bring your knees back to the starting position. Repeat the exercise to the other side. Repeat __________ times. Complete this exercise __________ times per day  EXTENSION RANGE OF MOTION AND FLEXIBILITY EXERCISES: STRETCH  Extension, Prone on Elbows  Lie on your stomach on the floor, a bed will be too soft. Place your palms about shoulder width apart and at the height of your head.  Place your elbows under your shoulders. If this is too painful, stack pillows under your chest.  Allow your body to relax so that your hips drop lower and make contact more completely with the floor.  Hold this position for __________ seconds.  Slowly return to lying flat on the floor. Repeat __________ times. Complete this exercise __________ times per day.  RANGE OF MOTION  Extension, Prone Press Ups  Lie on your stomach on the floor, a bed will be too soft.  Place your palms about shoulder width apart and at the height of your head.  Keeping your back as relaxed as possible, slowly straighten your elbows while keeping your hips on the floor. You may adjust the placement of your hands to maximize your comfort. As you gain motion, your hands will come more underneath your shoulders.  Hold this position __________ seconds.  Slowly return to lying flat on the floor. Repeat __________ times. Complete this exercise __________ times per day.  STRENGTHENING EXERCISES - Sciatica  These exercises may help you when beginning to rehabilitate your injury. These exercises should be done near your "sweet spot." This is the neutral, low-back arch, somewhere between fully rounded and fully  arched, that is your least painful position. When performed in this safe range of motion, these exercises can be used for people who have either a flexion or extension based injury. These exercises may resolve your symptoms with or without further involvement from your physician, physical therapist or athletic trainer. While completing these exercises, remember:   Muscles can gain both the endurance and the strength needed for everyday activities through controlled exercises.  Complete these exercises as instructed by your physician, physical therapist or athletic trainer. Progress with the resistance and repetition exercises only as your caregiver advises.  You may experience muscle soreness or fatigue, but the pain or discomfort you are trying to eliminate should never worsen during these exercises. If this pain does worsen, stop and make certain you are following the directions exactly. If the pain is still present after adjustments, discontinue the exercise until you can discuss the trouble with your clinician. STRENGTHENING Deep Abdominals, Pelvic Tilt   Lie on a firm bed or floor. Keeping your legs in front of you, bend your knees so they are both pointed toward the ceiling and your feet are flat on the floor.  Tense your lower abdominal muscles to press your low back into the floor. This motion will rotate your pelvis so that your tail bone is scooping upwards rather than pointing at your feet or into the floor.  With a gentle tension and even breathing, hold this position for __________ seconds. Repeat __________ times. Complete this exercise __________ times per day.  STRENGTHENING  Abdominals, Crunches   Lie on a firm bed or floor. Keeping your legs in front of you, bend your knees so they are both pointed toward the ceiling and your feet are flat on the floor. Cross your arms over your chest.  Slightly tip your chin down without bending your neck.  Tense your abdominals and slowly  lift your trunk high enough to just clear your shoulder blades. Lifting higher can put excessive stress on the low back and does not further strengthen your abdominal muscles.  Control your return to the starting position. Repeat __________ times. Complete this exercise __________ times per day.  STRENGTHENING  Quadruped, Opposite UE/LE Lift  Assume a hands and knees position on a firm surface. Keep your hands under your shoulders and your knees under your hips. You may place padding under your knees for comfort.  Find your neutral spine and gently tense your abdominal muscles so that you can maintain this position. Your shoulders and hips should form a rectangle that is parallel with the floor and is not twisted.  Keeping your trunk steady, lift your right hand no higher than your shoulder and then your left leg no higher than your hip. Make sure you are not holding your breath. Hold this  position __________ seconds.  Continuing to keep your abdominal muscles tense and your back steady, slowly return to your starting position. Repeat with the opposite arm and leg. Repeat __________ times. Complete this exercise __________ times per day.  STRENGTHENING  Abdominals and Quadriceps, Straight Leg Raise   Lie on a firm bed or floor with both legs extended in front of you.  Keeping one leg in contact with the floor, bend the other knee so that your foot can rest flat on the floor.  Find your neutral spine, and tense your abdominal muscles to maintain your spinal position throughout the exercise.  Slowly lift your straight leg off the floor about 6 inches for a count of 15, making sure to not hold your breath.  Still keeping your neutral spine, slowly lower your leg all the way to the floor. Repeat this exercise with each leg __________ times. Complete this exercise __________ times per day. POSTURE AND BODY MECHANICS CONSIDERATIONS - Sciatica Keeping correct posture when sitting, standing or  completing your activities will reduce the stress put on different body tissues, allowing injured tissues a chance to heal and limiting painful experiences. The following are general guidelines for improved posture. Your physician or physical therapist will provide you with any instructions specific to your needs. While reading these guidelines, remember:  The exercises prescribed by your provider will help you have the flexibility and strength to maintain correct postures.  The correct posture provides the optimal environment for your joints to work. All of your joints have less wear and tear when properly supported by a spine with good posture. This means you will experience a healthier, less painful body.  Correct posture must be practiced with all of your activities, especially prolonged sitting and standing. Correct posture is as important when doing repetitive low-stress activities (typing) as it is when doing a single heavy-load activity (lifting). RESTING POSITIONS Consider which positions are most painful for you when choosing a resting position. If you have pain with flexion-based activities (sitting, bending, stooping, squatting), choose a position that allows you to rest in a less flexed posture. You would want to avoid curling into a fetal position on your side. If your pain worsens with extension-based activities (prolonged standing, working overhead), avoid resting in an extended position such as sleeping on your stomach. Most people will find more comfort when they rest with their spine in a more neutral position, neither too rounded nor too arched. Lying on a non-sagging bed on your side with a pillow between your knees, or on your back with a pillow under your knees will often provide some relief. Keep in mind, being in any one position for a prolonged period of time, no matter how correct your posture, can still lead to stiffness. PROPER SITTING POSTURE In order to minimize stress and  discomfort on your spine, you must sit with correct posture Sitting with good posture should be effortless for a healthy body. Returning to good posture is a gradual process. Many people can work toward this most comfortably by using various supports until they have the flexibility and strength to maintain this posture on their own. When sitting with proper posture, your ears will fall over your shoulders and your shoulders will fall over your hips. You should use the back of the chair to support your upper back. Your low back will be in a neutral position, just slightly arched. You may place a small pillow or folded towel at the base of your low  back for support.  When working at a desk, create an environment that supports good, upright posture. Without extra support, muscles fatigue and lead to excessive strain on joints and other tissues. Keep these recommendations in mind: CHAIR:   A chair should be able to slide under your desk when your back makes contact with the back of the chair. This allows you to work closely.  The chair's height should allow your eyes to be level with the upper part of your monitor and your hands to be slightly lower than your elbows. BODY POSITION  Your feet should make contact with the floor. If this is not possible, use a foot rest.  Keep your ears over your shoulders. This will reduce stress on your neck and low back. INCORRECT SITTING POSTURES   If you are feeling tired and unable to assume a healthy sitting posture, do not slouch or slump. This puts excessive strain on your back tissues, causing more damage and pain. Healthier options include:  Using more support, like a lumbar pillow.  Switching tasks to something that requires you to be upright or walking.  Talking a brief walk.  Lying down to rest in a neutral-spine position. PROLONGED STANDING WHILE SLIGHTLY LEANING FORWARD  When completing a task that requires you to lean forward while standing in one  place for a long time, place either foot up on a stationary 2-4 inch high object to help maintain the best posture. When both feet are on the ground, the low back tends to lose its slight inward curve. If this curve flattens (or becomes too large), then the back and your other joints will experience too much stress, fatigue more quickly and can cause pain.  CORRECT STANDING POSTURES Proper standing posture should be assumed with all daily activities, even if they only take a few moments, like when brushing your teeth. As in sitting, your ears should fall over your shoulders and your shoulders should fall over your hips. You should keep a slight tension in your abdominal muscles to brace your spine. Your tailbone should point down to the ground, not behind your body, resulting in an over-extended swayback posture.  INCORRECT STANDING POSTURES  Common incorrect standing postures include a forward head, locked knees and/or an excessive swayback. WALKING Walk with an upright posture. Your ears, shoulders and hips should all line-up. PROLONGED ACTIVITY IN A FLEXED POSITION When completing a task that requires you to bend forward at your waist or lean over a low surface, try to find a way to stabilize 3 of 4 of your limbs. You can place a hand or elbow on your thigh or rest a knee on the surface you are reaching across. This will provide you more stability so that your muscles do not fatigue as quickly. By keeping your knees relaxed, or slightly bent, you will also reduce stress across your low back. CORRECT LIFTING TECHNIQUES DO :   Assume a wide stance. This will provide you more stability and the opportunity to get as close as possible to the object which you are lifting.  Tense your abdominals to brace your spine; then bend at the knees and hips. Keeping your back locked in a neutral-spine position, lift using your leg muscles. Lift with your legs, keeping your back straight.  Test the weight of  unknown objects before attempting to lift them.  Try to keep your elbows locked down at your sides in order get the best strength from your shoulders when carrying an  object.  Always ask for help when lifting heavy or awkward objects. INCORRECT LIFTING TECHNIQUES DO NOT:   Lock your knees when lifting, even if it is a small object.  Bend and twist. Pivot at your feet or move your feet when needing to change directions.  Assume that you cannot safely pick up a paperclip without proper posture. Document Released: 11/16/2005 Document Revised: 02/08/2012 Document Reviewed: 02/28/2009 [facility] Patient Information 2014 [place], [place]. Bronchitis Bronchitis is swelling (inflammation) of the air tubes leading to your lungs (bronchi). This causes mucus and a cough. If the swelling gets bad, you may have trouble breathing. HOME CARE   Rest.  Drink enough fluids to keep your pee (urine) clear or pale yellow (unless you have a condition where you have to watch how much you drink).  Only take medicine as told by your doctor. If you were given antibiotic medicines, finish them even if you start to feel better.  Avoid smoke, irritating chemicals, and strong smells. These make the problem worse. Quit smoking if you smoke. This helps your lungs heal faster.  Use a cool mist humidifier. Change the water in the humidifier every day. You can also sit in the bathroom with hot shower running for 5 10 minutes. Keep the door closed.  See your health care provider as told.  Wash your hands often. GET HELP IF: Your problems do not get better after 1 week. GET HELP RIGHT AWAY IF:   Your fever gets worse.  You have chills.  Your chest hurts.  Your problems breathing get worse.  You have blood in your mucus.  You pass out (faint).  You feel lightheaded.  You have a bad headache.  You throw up (vomit) again and again. MAKE SURE YOU:  Understand these instructions.  Will watch your  condition.  Will get help right away if you are not doing well or get worse. Document Released: 05/04/2008 Document Revised: 09/06/2013 Document Reviewed: 07/11/2013 [facility] Patient Information 2014 [place], [place].

## 2014-02-12 NOTE — Progress Notes (Signed)
Subjective:    Patient ID: Lauren Lopez, female    DOB: 1951-03-11, 63 y.o.   MRN: [id number]  HPI Comments: 63 yo female presents for 3 month F/U for HTN, Cholesterol, Pre-Dm, D. Deficient. She has not been exercising routinely with long work hours, but is very busy at work. She is eating healthier. She is checking BP at home and notes it has been good. She has had increased hours at work on feet on concrete and has increased edema and back pain. She elevates feet which helps with edema. Last LABS: CHOL         193   10/16/2013 HDL           79   10/16/2013 LDLCALC       85   10/16/2013 TRIG         143   10/16/2013 CHOLHDL      2.4   10/16/2013 ALT           18   10/16/2013 AST           24   10/16/2013 ALKPHOS      104   10/16/2013 BILITOT      0.5   10/16/2013 CREATININE     1.19   10/16/2013 BUN              13   10/16/2013 NA              139   10/16/2013 K               3.9   10/16/2013 CL              100   10/16/2013 CO2              31   10/16/2013 WBC      8.2   10/16/2013 HGB     12.5   10/16/2013 HCT     38.9   10/16/2013 MCV     81.9   10/16/2013 PLT      336   10/16/2013 HGBA1C      6.2   10/16/2013   She has noticed increased hoarseness/ drainage x 1 day. She has same type of issues every year. She has been using inhaler w/o relief, she has not been using allegra AD. She notes wheezing today. She denies asthma symptoms on regular basis and notes she usually only has flares with seasonal changes. She rarely uses rescue inhaler otherwise.  She has had increased back into right hip pain and into groin x 1 month. She notes difficult to raise right leg. She denies injury/ strain other than increased work hours. She had full hysterectomy 3 years ago. She denies any female symptoms.   She notes increase urine incontinence with increased cough. She denies any other UTI symptoms.   Gastrophageal Reflux She complains of coughing and wheezing. Associated symptoms  include fatigue.      Medication List       This list is accurate as of: 02/12/14  4:12 PM.  Always use your most recent med list.               budesonide-formoterol 160-4.5 MCG/ACT inhaler  Commonly known as:  SYMBICORT  Inhale 2 puffs into the lungs 2 (two) times daily.     CALCIUM 600 600 MG Tabs tablet  Generic drug:  calcium carbonate  Take 600 mg by mouth daily.     cholecalciferol 1000 UNITS tablet  Commonly known as:  VITAMIN D  Take 1,000 Units by mouth daily.     cyanocobalamin 100 MCG tablet  Take 100 mcg by mouth daily.     estrogens conjugated (synthetic B) 0.45 MG tablet  Commonly known as:  ENJUVIA  Take 0.45 mg by mouth daily.     fexofenadine 180 MG tablet  Commonly known as:  ALLEGRA  Take 180 mg by mouth daily as needed.     hydrochlorothiazide 25 MG tablet  Commonly known as:  HYDRODIURIL  Take 25 mg by mouth daily.     MAGNESIUM PO  Take 250 mg by mouth daily.     multivitamin with minerals tablet  Take 1 tablet by mouth daily.     HAIR/SKIN/NAILS PO  Take by mouth daily.     PROBIOTIC DAILY PO  Take by mouth daily.     RABEprazole 20 MG tablet  Commonly known as:  ACIPHEX  Take 20 mg by mouth daily.       Allergies  Allergen Reactions  . Metronidazole Other (See Comments)    unknown  . Ppd [Tuberculin Purified Protein Derivative] Other (See Comments)    + PPD 2013 with NEG CXR 05/2012   Past Medical History  Diagnosis Date  . Pneumonia 5/13  . Multiple allergies   . GERD (gastroesophageal reflux disease)   . Arthritis   . Bilateral carpal tunnel syndrome   . Wears glasses   . Wears dentures     upper  . Positive PPD   . Right rotator cuff tear 01/20/2013  . Hypertension   . Hyperlipidemia   . Unspecified vitamin D deficiency   . Asthma   . Anemia      Review of Systems  Constitutional: Positive for fatigue.  HENT: Positive for congestion and postnasal drip.   Respiratory: Positive for cough and wheezing.    Genitourinary: Positive for frequency and difficulty urinating.  Musculoskeletal: Positive for back pain.  All other systems reviewed and are negative.   BP 116/60  Pulse 78  Temp(Src) 98.2 F (36.8 C) (Temporal)  Resp 18  Ht 5' 2.5" (1.588 m)  Wt 198 lb (89.812 kg)  BMI 35.62 kg/m2     Objective:   Physical Exam  Nursing note and vitals reviewed. Constitutional: She is oriented to person, place, and time. She appears well-developed and well-nourished. No distress.  HENT:  Head: Normocephalic and atraumatic.  Right Ear: External ear normal.  Left Ear: External ear normal.  Nose: Nose normal.  Mouth/Throat: Oropharynx is clear and moist. No oropharyngeal exudate.  Cloudy TM's bilaterally   Eyes: Conjunctivae and EOM are normal.  Neck: Normal range of motion. Neck supple. No JVD present. No thyromegaly present.  Cardiovascular: Normal rate, regular rhythm, normal heart sounds and intact distal pulses.   Pulmonary/Chest: Effort normal. She has wheezes.  Abdominal: Soft. Bowel sounds are normal. She exhibits no distension and no mass. There is no tenderness. There is no rebound and no guarding.  Musculoskeletal: Normal range of motion. She exhibits no edema and no tenderness.  Mild discomfort in back with right hip internal ROM  Lymphadenopathy:    She has no cervical adenopathy.  Neurological: She is alert and oriented to person, place, and time. No cranial nerve deficit.  Right DTR patellar >left  Skin: Skin is warm and dry. No rash noted. No erythema. No pallor.  Psychiatric: She has a normal mood and affect. Her behavior is normal. Judgment and thought content normal.  Depression screen- NEG    Assessment & Plan:  1.  3 month F/U for HTN, Cholesterol, Pre-Dm, D. Deficient. Needs healthy diet, cardio QD and obtain healthy weight. Check Labs, Check BP if >130/80 call office  2. Urine incontinence, Fatigue- check labs, increase activity and H2O- Hygiene  discussed,  3. Back/ Hip pain/ edema- get xrays, If Xrays are negative we will get Ct abdomen pelvis to evaluate for ovarian or if symptoms increase patient will f/u. Elevate legs, try to do stretching and wear compression socks at work.   4.Bonchitis/ Allergic rhinitis- Allegra OTC, increase H2o, allergy hygiene explained. Prednisone DP 10 mg, Check labs  OVER 40 minutes of exam, counseling, chart review, referral performed

## 2014-02-13 ENCOUNTER — Ambulatory Visit
Admission: RE | Admit: 2014-02-13 | Discharge: 2014-02-13 | Disposition: A | Discharge status: Home / Self Care | Payer: BC Managed Care – PPO | Source: Ambulatory Visit | Attending: Emergency Medicine | Admitting: Emergency Medicine

## 2014-02-13 DIAGNOSIS — M549 Dorsalgia, unspecified: Secondary | ICD-10-CM

## 2014-02-13 DIAGNOSIS — R059 Cough, unspecified: Secondary | ICD-10-CM

## 2014-02-13 DIAGNOSIS — R05 Cough: Secondary | ICD-10-CM

## 2014-02-13 DIAGNOSIS — M25559 Pain in unspecified hip: Secondary | ICD-10-CM

## 2014-02-13 LAB — URINALYSIS, ROUTINE W REFLEX MICROSCOPIC
Bilirubin Urine: NEGATIVE
GLUCOSE, UA: NEGATIVE mg/dL
Hgb urine dipstick: NEGATIVE
Ketones, ur: NEGATIVE mg/dL
LEUKOCYTES UA: NEGATIVE
Nitrite: NEGATIVE
Protein, ur: NEGATIVE mg/dL
Specific Gravity, Urine: 1.018 (ref 1.005–1.030)
Urobilinogen, UA: 0.2 mg/dL (ref 0.0–1.0)
pH: 6.5 (ref 5.0–8.0)

## 2014-02-13 LAB — BASIC METABOLIC PANEL WITH GFR
BUN: 9 mg/dL (ref 6–23)
CHLORIDE: 100 meq/L (ref 96–112)
CO2: 28 meq/L (ref 19–32)
Calcium: 9.7 mg/dL (ref 8.4–10.5)
Creat: 0.75 mg/dL (ref 0.50–1.10)
GFR, Est African American: >89 mL/min
GFR, Est Non African American: 86 mL/min
GLUCOSE: 104 mg/dL — AB (ref 70–99)
POTASSIUM: 4 meq/L (ref 3.5–5.3)
Sodium: 138 mEq/L (ref 135–145)

## 2014-02-13 LAB — CBC WITH DIFFERENTIAL/PLATELET
BASOS ABS: 0 10*3/uL (ref 0.0–0.1)
Basophils Relative: 0 % (ref 0–1)
Eosinophils Absolute: 0.1 10*3/uL (ref 0.0–0.7)
Eosinophils Relative: 2 % (ref 0–5)
HEMATOCRIT: 41 % (ref 36.0–46.0)
HEMOGLOBIN: 13 g/dL (ref 12.0–15.0)
LYMPHS PCT: 58 % — AB (ref 12–46)
Lymphs Abs: 4.3 10*3/uL — ABNORMAL HIGH (ref 0.7–4.0)
MCH: 26.3 pg (ref 26.0–34.0)
MCHC: 31.7 g/dL (ref 30.0–36.0)
MCV: 83 fL (ref 78.0–100.0)
MONO ABS: 0.3 10*3/uL (ref 0.1–1.0)
MONOS PCT: 4 % (ref 3–12)
NEUTROS ABS: 2.7 10*3/uL (ref 1.7–7.7)
NEUTROS PCT: 36 % — AB (ref 43–77)
Platelets: 319 10*3/uL (ref 150–400)
RBC: 4.94 MIL/uL (ref 3.87–5.11)
RDW: 14.6 % (ref 11.5–15.5)
WBC: 7.4 10*3/uL (ref 4.0–10.5)

## 2014-02-13 LAB — HEMOGLOBIN A1C
Hgb A1c MFr Bld: 6.2 % — ABNORMAL HIGH (ref <5.7)
MEAN PLASMA GLUCOSE: 131 mg/dL — AB (ref <117)

## 2014-02-13 LAB — HEPATIC FUNCTION PANEL
ALBUMIN: 4.2 g/dL (ref 3.5–5.2)
ALT: 17 U/L (ref 0–35)
AST: 22 U/L (ref 0–37)
Alkaline Phosphatase: 93 U/L (ref 39–117)
BILIRUBIN TOTAL: 0.3 mg/dL (ref 0.2–1.2)
Bilirubin, Direct: 0.1 mg/dL (ref 0.0–0.3)
Indirect Bilirubin: 0.2 mg/dL (ref 0.2–1.2)
TOTAL PROTEIN: 7.3 g/dL (ref 6.0–8.3)

## 2014-02-13 LAB — INSULIN, FASTING: Insulin fasting, serum: 35 u[IU]/mL — ABNORMAL HIGH (ref 3–28)

## 2014-02-13 LAB — LIPID PANEL
CHOLESTEROL: 215 mg/dL — AB (ref 0–200)
HDL: 86 mg/dL (ref >39)
LDL Cholesterol: 97 mg/dL (ref 0–99)
Total CHOL/HDL Ratio: 2.5 Ratio
Triglycerides: 159 mg/dL — ABNORMAL HIGH (ref <150)
VLDL: 32 mg/dL (ref 0–40)

## 2014-02-13 LAB — TSH: TSH: 2.938 u[IU]/mL (ref 0.350–4.500)

## 2014-02-13 LAB — VITAMIN D 25 HYDROXY (VIT D DEFICIENCY, FRACTURES): Vit D, 25-Hydroxy: 51 ng/mL (ref 30–89)

## 2014-02-13 LAB — MAGNESIUM: MAGNESIUM: 1.7 mg/dL (ref 1.5–2.5)

## 2014-02-14 LAB — URINE CULTURE
COLONY COUNT: NO GROWTH
ORGANISM ID, BACTERIA: NO GROWTH

## 2014-02-21 NOTE — Progress Notes (Signed)
LMOM TO CALL TO SCHEDULE  

## 2014-03-19 ENCOUNTER — Encounter (INDEPENDENT_AMBULATORY_CARE_PROVIDER_SITE_OTHER): Payer: Self-pay

## 2014-03-19 ENCOUNTER — Other Ambulatory Visit: Payer: BLUE CROSS/BLUE SHIELD

## 2014-03-19 DIAGNOSIS — R6889 Other general symptoms and signs: Secondary | ICD-10-CM

## 2014-03-19 LAB — CBC WITH DIFFERENTIAL/PLATELET
Basophils Absolute: 0 10*3/uL (ref 0.0–0.1)
Basophils Relative: 0 % (ref 0–1)
Eosinophils Absolute: 0.1 10*3/uL (ref 0.0–0.7)
Eosinophils Relative: 1 % (ref 0–5)
HCT: 38.8 % (ref 36.0–46.0)
Hemoglobin: 12.8 g/dL (ref 12.0–15.0)
LYMPHS ABS: 4.1 10*3/uL — AB (ref 0.7–4.0)
LYMPHS PCT: 50 % — AB (ref 12–46)
MCH: 26.7 pg (ref 26.0–34.0)
MCHC: 33 g/dL (ref 30.0–36.0)
MCV: 81 fL (ref 78.0–100.0)
MONOS PCT: 6 % (ref 3–12)
Monocytes Absolute: 0.5 10*3/uL (ref 0.1–1.0)
NEUTROS ABS: 3.5 10*3/uL (ref 1.7–7.7)
Neutrophils Relative %: 43 % (ref 43–77)
Platelets: 364 10*3/uL (ref 150–400)
RBC: 4.79 MIL/uL (ref 3.87–5.11)
RDW: 15 % (ref 11.5–15.5)
WBC: 8.2 10*3/uL (ref 4.0–10.5)

## 2014-04-17 ENCOUNTER — Ambulatory Visit (INDEPENDENT_AMBULATORY_CARE_PROVIDER_SITE_OTHER): Payer: BC Managed Care – PPO | Admitting: Emergency Medicine

## 2014-04-17 ENCOUNTER — Telehealth: Payer: Self-pay | Admitting: Internal Medicine

## 2014-04-17 ENCOUNTER — Ambulatory Visit
Admission: RE | Admit: 2014-04-17 | Discharge: 2014-04-17 | Disposition: A | Discharge status: Home / Self Care | Payer: BC Managed Care – PPO | Source: Ambulatory Visit | Attending: Emergency Medicine | Admitting: Emergency Medicine

## 2014-04-17 ENCOUNTER — Encounter: Payer: Self-pay | Admitting: Emergency Medicine

## 2014-04-17 VITALS — BP 152/80 | HR 82 | Temp 98.2°F | Resp 18 | Ht 62.5 in | Wt 196.0 lb

## 2014-04-17 DIAGNOSIS — M542 Cervicalgia: Secondary | ICD-10-CM

## 2014-04-17 DIAGNOSIS — R079 Chest pain, unspecified: Secondary | ICD-10-CM

## 2014-04-17 DIAGNOSIS — M940 Chondrocostal junction syndrome [Tietze]: Secondary | ICD-10-CM

## 2014-04-17 MED ORDER — PREDNISONE 10 MG PO TABS
ORAL_TABLET | ORAL | Status: DC
Start: 2014-04-17 — End: 2014-05-28

## 2014-04-17 NOTE — Patient Instructions (Addendum)
Chest Pain (Nonspecific) ER if any increase in PAIN It is often hard to give a specific diagnosis for the cause of chest pain. There is always a chance that your pain could be related to something serious, such as a heart attack or a blood clot in the lungs. You need to follow up with your caregiver for further evaluation. CAUSES   Heartburn.  Pneumonia or bronchitis.  Anxiety or stress.  Inflammation around your heart (pericarditis) or lung (pleuritis or pleurisy).  A blood clot in the lung.  A collapsed lung (pneumothorax). It can develop suddenly on its own (spontaneous pneumothorax) or from injury (trauma) to the chest.  Shingles infection (herpes zoster virus). The chest wall is composed of bones, muscles, and cartilage. Any of these can be the source of the pain.  The bones can be bruised by injury.  The muscles or cartilage can be strained by coughing or overwork.  The cartilage can be affected by inflammation and become sore (costochondritis). DIAGNOSIS  Lab tests or other studies, such as X-rays, electrocardiography, stress testing, or cardiac imaging, may be needed to find the cause of your pain.  TREATMENT   Treatment depends on what may be causing your chest pain. Treatment may include:  Acid blockers for heartburn.  Anti-inflammatory medicine.  Pain medicine for inflammatory conditions.  Antibiotics if an infection is present.  You may be advised to change lifestyle habits. This includes stopping smoking and avoiding alcohol, caffeine, and chocolate.  You may be advised to keep your head raised (elevated) when sleeping. This reduces the chance of acid going backward from your stomach into your esophagus.  Most of the time, nonspecific chest pain will improve within 2 to 3 days with rest and mild pain medicine. HOME CARE INSTRUCTIONS   If antibiotics were prescribed, take your antibiotics as directed. Finish them even if you start to feel better.  For the  next few days, avoid physical activities that bring on chest pain. Continue physical activities as directed.  Do not smoke.  Avoid drinking alcohol.  Only take over-the-counter or prescription medicine for pain, discomfort, or fever as directed by your caregiver.  Follow your caregiver's suggestions for further testing if your chest pain does not go away.  Keep any follow-up appointments you made. If you do not go to an appointment, you could develop lasting (chronic) problems with pain. If there is any problem keeping an appointment, you must call to reschedule. SEEK MEDICAL CARE IF:   You think you are having problems from the medicine you are taking. Read your medicine instructions carefully.  Your chest pain does not go away, even after treatment.  You develop a rash with blisters on your chest. SEEK IMMEDIATE MEDICAL CARE IF:   You have increased chest pain or pain that spreads to your arm, neck, jaw, back, or abdomen.  You develop shortness of breath, an increasing cough, or you are coughing up blood.  You have severe back or abdominal pain, feel nauseous, or vomit.  You develop severe weakness, fainting, or chills.  You have a fever. THIS IS AN EMERGENCY. Do not wait to see if the pain will go away. Get medical help at once. Call your local emergency services (911 in [place] Do not drive yourself to the hospital. MAKE SURE YOU:   Understand these instructions.  Will watch your condition.  Will get help right away if you are not doing well or get worse. Document Released: 08/26/2005 Document Revised: 02/08/2012 Document Reviewed:  06/21/2008 Exit[employer] Patient Information 2014 [place].  Costochondritis Costochondritis is a condition in which the tissue (cartilage) that connects your ribs with your breastbone (sternum) becomes irritated. It causes pain in the chest and rib area. It usually goes away on its own over time. HOME CARE  Avoid activities that wear you  out.  Do not strain your ribs. Avoid activities that use your:  Chest.  Belly.  Side muscles.  Put ice on the area for the first 2 days after the pain starts.  Put ice in a plastic bag.  Place a towel between your skin and the bag.  Leave the ice on for 20 minutes, 2 3 times a day.  Only take medicine as told by your doctor. GET HELP IF:  You have redness or puffiness (swelling) in the rib area.  Your pain does not go away with rest or medicine. GET HELP RIGHT AWAY IF:   Your pain gets worse.  You are very uncomfortable.  You have trouble breathing.  You cough up blood.  You start sweating or throwing up (vomiting).  You have a fever or lasting symptoms for more than 2 3 days.  You have a fever and your symptoms suddenly get worse. MAKE SURE YOU:   Understand these instructions.  Will watch your condition.  Will get help right away if you are not doing well or get worse. Document Released: 05/04/2008 Document Revised: 07/19/2013 Document Reviewed: 06/20/2013 [facility] Patient Information 2014 [place].

## 2014-04-17 NOTE — Telephone Encounter (Signed)
Patient called and said that the prednisone was not at pharm. [facility]  Please advise patient if this was not your intention. Otherwise pharmacy will call patient when ready.  Thank you, Katrina [facility] [phone]

## 2014-04-17 NOTE — Progress Notes (Signed)
Subjective:    Patient ID: Lauren Lopez, female    DOB: 11/23/1951, 63 y.o.   MRN: [id number]  HPI Comments: 63 yo pleasant AAF noted left upper chest pain that radiates into axilla. She denies strain or new exposure. She has checked breast and denies any lumps/ masses. She notes at worse pain is 10, currently pain at 2. She notes pain was at 10 this a.m. She denies any recent illness. She denies any trouble with ROM with shoulder. She notes pain is worse with turning head to left. She notes she lifts heavy boxes at work routinely but denies any pain with work. Mammo NEG 12/14. She denies any other CV symptoms.  Arm Pain  Associated symptoms include chest pain.      Medication List       This list is accurate as of: 04/17/14  2:25 PM.  Always use your most recent med list.               budesonide-formoterol 160-4.5 MCG/ACT inhaler  Commonly known as:  SYMBICORT  Inhale 2 puffs into the lungs 2 (two) times daily.     CALCIUM 600 600 MG Tabs tablet  Generic drug:  calcium carbonate  Take 600 mg by mouth daily.     cholecalciferol 1000 UNITS tablet  Commonly known as:  VITAMIN D  Take 1,000 Units by mouth daily.     cyanocobalamin 100 MCG tablet  Take 100 mcg by mouth daily.     estrogens conjugated (synthetic B) 0.45 MG tablet  Commonly known as:  ENJUVIA  Take 0.45 mg by mouth daily.     fexofenadine 180 MG tablet  Commonly known as:  ALLEGRA  Take 180 mg by mouth daily as needed.     hydrochlorothiazide 25 MG tablet  Commonly known as:  HYDRODIURIL  Take 25 mg by mouth daily.     MAGNESIUM PO  Take 250 mg by mouth daily.     multivitamin with minerals tablet  Take 1 tablet by mouth daily.     HAIR/SKIN/NAILS PO  Take by mouth daily.     PROBIOTIC DAILY PO  Take by mouth daily.     RABEprazole 20 MG tablet  Commonly known as:  ACIPHEX  Take 20 mg by mouth daily.       Allergies  Allergen Reactions  . Metronidazole Other (See Comments)   unknown  . Ppd [Tuberculin Purified Protein Derivative] Other (See Comments)    + PPD 2013 with NEG CXR 05/2012   Past Medical History  Diagnosis Date  . Pneumonia 5/13  . Multiple allergies   . GERD (gastroesophageal reflux disease)   . Arthritis   . Bilateral carpal tunnel syndrome   . Wears glasses   . Wears dentures     upper  . Positive PPD   . Right rotator cuff tear 01/20/2013  . Hypertension   . Hyperlipidemia   . Unspecified vitamin D deficiency   . Asthma   . Anemia      Review of Systems  Cardiovascular: Positive for chest pain.  Musculoskeletal: Positive for neck pain.  All other systems reviewed and are negative.  BP 152/80  Pulse 82  Temp(Src) 98.2 F (36.8 C) (Temporal)  Resp 18  Ht 5' 2.5" (1.588 m)  Wt 196 lb (88.905 kg)  BMI 35.26 kg/m2     Objective:   Physical Exam  Nursing note and vitals reviewed. Constitutional: She is oriented to person, place, and time.  She appears well-developed and well-nourished. No distress.  HENT:  Head: Normocephalic and atraumatic.  Right Ear: External ear normal.  Left Ear: External ear normal.  Nose: Nose normal.  Mouth/Throat: Oropharynx is clear and moist.  Eyes: Conjunctivae and EOM are normal.  Neck: Normal range of motion. Neck supple. No JVD present. No thyromegaly present.  Cardiovascular: Normal rate, regular rhythm, normal heart sounds and intact distal pulses.   Pulmonary/Chest: Effort normal and breath sounds normal.  Abdominal: Soft. Bowel sounds are normal. She exhibits no distension and no mass. There is no tenderness. There is no rebound and no guarding.  Genitourinary:  Left breast WNL  Musculoskeletal: She exhibits tenderness. She exhibits no edema.  Left upper chest with rotation of head to left  Lymphadenopathy:    She has no cervical adenopathy.  Neurological: She is alert and oriented to person, place, and time. No cranial nerve deficit.  Skin: Skin is warm and dry. No rash noted. No  erythema. No pallor.  Psychiatric: She has a normal mood and affect. Her behavior is normal. Judgment and thought content normal.      EKG NSCSPT WNL     Assessment & Plan:  1. Chest pain vs costochondritis vs neck pain radiation- Check ekg/ CXR/ cervical spine, w/c if SX increase or ER. Prednisone Dp 10 mg AD. OOW x 3 days

## 2014-05-22 ENCOUNTER — Other Ambulatory Visit: Payer: Self-pay | Admitting: Orthopedic Surgery

## 2014-05-28 ENCOUNTER — Encounter (HOSPITAL_BASED_OUTPATIENT_CLINIC_OR_DEPARTMENT_OTHER): Payer: Self-pay | Admitting: *Deleted

## 2014-05-28 NOTE — Progress Notes (Signed)
To come in for bmet 

## 2014-05-28 NOTE — Progress Notes (Signed)
05/28/14 1455  OBSTRUCTIVE SLEEP APNEA  Have you ever been diagnosed with sleep apnea through a sleep study? No  Do you snore loudly (loud enough to be heard through closed doors)?  0  Do you often feel tired, fatigued, or sleepy during the daytime? 0  Has anyone observed you stop breathing during your sleep? 0  Do you have, or are you being treated for high blood pressure? 1  BMI more than 35 kg/m2? 1  Age over 63 years old? 1  Neck circumference greater than 40 cm/16 inches? 1  Gender: 0  Obstructive Sleep Apnea Score 4  Score 4 or greater  Results sent to PCP

## 2014-05-30 ENCOUNTER — Encounter (HOSPITAL_BASED_OUTPATIENT_CLINIC_OR_DEPARTMENT_OTHER)
Admission: RE | Admit: 2014-05-30 | Discharge: 2014-05-30 | Disposition: A | Discharge status: Home / Self Care | Payer: BC Managed Care – PPO | Source: Ambulatory Visit | Attending: Orthopedic Surgery | Admitting: Orthopedic Surgery

## 2014-05-30 DIAGNOSIS — K219 Gastro-esophageal reflux disease without esophagitis: Secondary | ICD-10-CM | POA: Diagnosis not present

## 2014-05-30 DIAGNOSIS — Z888 Allergy status to other drugs, medicaments and biological substances status: Secondary | ICD-10-CM | POA: Diagnosis not present

## 2014-05-30 DIAGNOSIS — Z79899 Other long term (current) drug therapy: Secondary | ICD-10-CM | POA: Diagnosis not present

## 2014-05-30 DIAGNOSIS — I10 Essential (primary) hypertension: Secondary | ICD-10-CM | POA: Diagnosis not present

## 2014-05-30 DIAGNOSIS — J45909 Unspecified asthma, uncomplicated: Secondary | ICD-10-CM | POA: Diagnosis not present

## 2014-05-30 DIAGNOSIS — G5600 Carpal tunnel syndrome, unspecified upper limb: Secondary | ICD-10-CM | POA: Diagnosis not present

## 2014-05-30 DIAGNOSIS — Z8701 Personal history of pneumonia (recurrent): Secondary | ICD-10-CM | POA: Diagnosis not present

## 2014-05-30 LAB — BASIC METABOLIC PANEL
Anion gap: 17 — ABNORMAL HIGH (ref 5–15)
BUN: 12 mg/dL (ref 6–23)
CO2: 24 mEq/L (ref 19–32)
CREATININE: 0.84 mg/dL (ref 0.50–1.10)
Calcium: 9.6 mg/dL (ref 8.4–10.5)
Chloride: 99 mEq/L (ref 96–112)
GFR calc Af Amer: 85 mL/min — ABNORMAL LOW (ref >90)
GFR calc non Af Amer: 73 mL/min — ABNORMAL LOW (ref >90)
GLUCOSE: 92 mg/dL (ref 70–99)
Potassium: 3.4 mEq/L — ABNORMAL LOW (ref 3.7–5.3)
Sodium: 140 mEq/L (ref 137–147)

## 2014-05-30 NOTE — H&P (Signed)
Lauren Lopez is an 63 y.o. female.   Chief Complaint: c/o chronic and progressive numbness and tingling of the right hand HPI: Lauren Lopez returns for follow-up examination of her right hand numbness and right thumb pain at the [facility] joint.  Lauren Lopez had electrodiagnostic studies in January, 2013.  This revealed evidence of bilateral carpal tunnel syndrome left worse than right.  She had left carpal tunnel release July 15, 2012 and had excellent long term result.  She now returns with increasing numbness on the right side.  Her symptoms are compatible with carpal tunnel syndrome  Past Medical History  Diagnosis Date  . Pneumonia 5/13  . Multiple allergies   . GERD (gastroesophageal reflux disease)   . Arthritis   . Bilateral carpal tunnel syndrome   . Wears glasses   . Wears dentures     upper  . Positive PPD   . Right rotator cuff tear 01/20/2013  . Hypertension   . Hyperlipidemia   . Unspecified vitamin D deficiency   . Asthma   . Anemia     Past Surgical History  Procedure Laterality Date  . Bilateral salpingoophorectomy  11/28/2010    open laparoscopy with adhesiolysis also  . Knee arthroscopy  2005    rt  . Colonoscopy    . Carpal tunnel release  07/15/2012    Procedure: CARPAL TUNNEL RELEASE;  Surgeon: Cammie Sickle., MD;  Location: [place];  Service: Orthopedics;  Laterality: Left;  . Shoulder arthroscopy with rotator cuff repair and subacromial decompression Right 01/20/2013    Procedure: RIGHT ARTHROSCOPY SHOULDER DEBRIDEMENT LIMITED, ARTHROSCOPY SHOULDER DECOMPRESSION SUBACROMIAL PARTIAL ACROMIOPLASTY WITH CORACOACROMIAL RELEASE, ROTATOR CUFF REPAIR ;  Surgeon: Johnny Bridge, MD;  Location: [place];  Service: Orthopedics;  Laterality: Right;  RIGHT SHOULDER SCOPE DEBRIDEMENT, ACRIOMIOPLASTY, ROTATOR CUFF REPAIR  . Abdominal hysterectomy      Family History  Problem Relation Age of Onset  . Hypertension Mother   .  Diabetes Mother   . Stroke Mother   . Hypertension Father   . Heart disease Brother   . Hyperlipidemia Brother   . Diabetes Brother    Social History:  reports that she has never smoked. She does not have any smokeless tobacco history on file. She reports that she does not drink alcohol or use illicit drugs.  Allergies:  Allergies  Allergen Reactions  . Metronidazole Other (See Comments)    unknown  . Ppd [Tuberculin Purified Protein Derivative] Other (See Comments)    + PPD 2013 with NEG CXR 05/2012    No prescriptions prior to admission    No results found for this or any previous visit (from the past 48 hour(s)).  No results found.   Pertinent items are noted in HPI.  Height 5' 2.5" (1.588 m), weight 88.451 kg (195 lb).  General appearance: alert Head: Normocephalic, without obvious abnormality Neck: supple, symmetrical, trachea midline Resp: clear to auscultation bilaterally Cardio: regular rate and rhythm GI: normal findings: bowel sounds normal Extremities:She has a positive Phalen's and Tinel's. FROM of the wrist and digits is noted . We sent her to Dr. Catalina Gravel for electrodiagnostic studies. She is noted to have a  right carpal tunnel syndrome.  Pulses: 2+ and symmetric Skin: normal Neurologic: Grossly normal    Assessment/Plan Impression:  Right CTS  Plan; To the OR for right CTR.The procedure, risks,benefits and post-op course were discussed with the patient at length and they were in agreement with the  plan.  [name],Ione [place] J 05/30/2014, 12:54 PM  H&P documentation: 05/31/2014  -History and Physical Reviewed  -Patient has been re-examined  -No change in the plan of care  Cammie Sickle, MD

## 2014-05-31 ENCOUNTER — Ambulatory Visit (HOSPITAL_BASED_OUTPATIENT_CLINIC_OR_DEPARTMENT_OTHER)
Admission: RE | Admit: 2014-05-31 | Discharge: 2014-05-31 | Disposition: A | Discharge status: Home / Self Care | Payer: Worker's Compensation | Source: Ambulatory Visit | Attending: Orthopedic Surgery | Admitting: Orthopedic Surgery

## 2014-05-31 ENCOUNTER — Encounter (HOSPITAL_BASED_OUTPATIENT_CLINIC_OR_DEPARTMENT_OTHER)
Admission: RE | Disposition: A | Discharge status: Home / Self Care | Payer: Self-pay | Source: Ambulatory Visit | Attending: Orthopedic Surgery

## 2014-05-31 ENCOUNTER — Ambulatory Visit (HOSPITAL_BASED_OUTPATIENT_CLINIC_OR_DEPARTMENT_OTHER): Payer: Worker's Compensation | Admitting: Anesthesiology

## 2014-05-31 ENCOUNTER — Encounter (HOSPITAL_BASED_OUTPATIENT_CLINIC_OR_DEPARTMENT_OTHER): Payer: Worker's Compensation | Admitting: Anesthesiology

## 2014-05-31 ENCOUNTER — Encounter (HOSPITAL_BASED_OUTPATIENT_CLINIC_OR_DEPARTMENT_OTHER): Payer: Self-pay | Admitting: *Deleted

## 2014-05-31 DIAGNOSIS — K219 Gastro-esophageal reflux disease without esophagitis: Secondary | ICD-10-CM | POA: Insufficient documentation

## 2014-05-31 DIAGNOSIS — Z79899 Other long term (current) drug therapy: Secondary | ICD-10-CM | POA: Insufficient documentation

## 2014-05-31 DIAGNOSIS — I10 Essential (primary) hypertension: Secondary | ICD-10-CM | POA: Insufficient documentation

## 2014-05-31 DIAGNOSIS — J45909 Unspecified asthma, uncomplicated: Secondary | ICD-10-CM | POA: Insufficient documentation

## 2014-05-31 DIAGNOSIS — G5600 Carpal tunnel syndrome, unspecified upper limb: Secondary | ICD-10-CM | POA: Diagnosis not present

## 2014-05-31 DIAGNOSIS — Z888 Allergy status to other drugs, medicaments and biological substances status: Secondary | ICD-10-CM | POA: Insufficient documentation

## 2014-05-31 DIAGNOSIS — Z8701 Personal history of pneumonia (recurrent): Secondary | ICD-10-CM | POA: Insufficient documentation

## 2014-05-31 HISTORY — PX: CARPAL TUNNEL RELEASE: SHX101

## 2014-05-31 LAB — POCT HEMOGLOBIN-HEMACUE: HEMOGLOBIN: 14 g/dL (ref 12.0–15.0)

## 2014-05-31 SURGERY — CARPAL TUNNEL RELEASE
Anesthesia: General | Site: Wrist | Laterality: Right

## 2014-05-31 MED ORDER — MIDAZOLAM HCL 2 MG/2ML IJ SOLN: 1.0000 mg | INTRAMUSCULAR | Status: DC | PRN

## 2014-05-31 MED ORDER — LIDOCAINE HCL (CARDIAC) 20 MG/ML IV SOLN
INTRAVENOUS | Status: DC | PRN
  Administered 2014-05-31: 40 mg via INTRAVENOUS

## 2014-05-31 MED ORDER — OXYCODONE HCL 5 MG/5ML PO SOLN: 5.0000 mg | Freq: Once | ORAL | Status: DC | PRN

## 2014-05-31 MED ORDER — CHLORHEXIDINE GLUCONATE 4 % EX LIQD: 60.0000 mL | Freq: Once | CUTANEOUS | Status: DC

## 2014-05-31 MED ORDER — DEXAMETHASONE SODIUM PHOSPHATE 10 MG/ML IJ SOLN
INTRAMUSCULAR | Status: DC | PRN
  Administered 2014-05-31: 10 mg via INTRAVENOUS

## 2014-05-31 MED ORDER — FENTANYL CITRATE 0.05 MG/ML IJ SOLN
INTRAMUSCULAR | Status: DC | PRN
  Administered 2014-05-31 (×2): 50 ug via INTRAVENOUS

## 2014-05-31 MED ORDER — LACTATED RINGERS IV SOLN
INTRAVENOUS | Status: DC
  Administered 2014-05-31 (×2): via INTRAVENOUS

## 2014-05-31 MED ORDER — MIDAZOLAM HCL 2 MG/2ML IJ SOLN
INTRAMUSCULAR | Status: AC
  Filled 2014-05-31: qty 2

## 2014-05-31 MED ORDER — HYDROMORPHONE HCL PF 1 MG/ML IJ SOLN: 0.2500 mg | INTRAMUSCULAR | Status: DC | PRN

## 2014-05-31 MED ORDER — OXYCODONE HCL 5 MG PO TABS: 5.0000 mg | ORAL_TABLET | Freq: Once | ORAL | Status: DC | PRN

## 2014-05-31 MED ORDER — FENTANYL CITRATE 0.05 MG/ML IJ SOLN: 50.0000 ug | INTRAMUSCULAR | Status: DC | PRN

## 2014-05-31 MED ORDER — LIDOCAINE HCL 2 % IJ SOLN
INTRAMUSCULAR | Status: DC | PRN
  Administered 2014-05-31: 4 mL

## 2014-05-31 MED ORDER — FENTANYL CITRATE 0.05 MG/ML IJ SOLN
INTRAMUSCULAR | Status: AC
Start: 2014-05-31 — End: 2014-05-31
  Filled 2014-05-31: qty 2

## 2014-05-31 MED ORDER — PROPOFOL 10 MG/ML IV BOLUS
INTRAVENOUS | Status: AC
  Filled 2014-05-31: qty 100

## 2014-05-31 MED ORDER — LIDOCAINE HCL 2 % IJ SOLN
INTRAMUSCULAR | Status: AC
  Filled 2014-05-31: qty 20

## 2014-05-31 MED ORDER — MIDAZOLAM HCL 5 MG/5ML IJ SOLN
INTRAMUSCULAR | Status: DC | PRN
  Administered 2014-05-31: 2 mg via INTRAVENOUS

## 2014-05-31 MED ORDER — SUCCINYLCHOLINE CHLORIDE 20 MG/ML IJ SOLN
INTRAMUSCULAR | Status: AC
  Filled 2014-05-31: qty 3

## 2014-05-31 SURGICAL SUPPLY — 42 items
BANDAGE ADH SHEER 1  50/CT (GAUZE/BANDAGES/DRESSINGS) IMPLANT
BANDAGE COBAN STERILE 2 (GAUZE/BANDAGES/DRESSINGS) IMPLANT
BANDAGE ELASTIC 3 VELCRO ST LF (GAUZE/BANDAGES/DRESSINGS) ×2 IMPLANT
BLADE SURG 15 STRL LF DISP TIS (BLADE) ×1 IMPLANT
BLADE SURG 15 STRL SS (BLADE) ×2
BNDG CMPR 9X4 STRL LF SNTH (GAUZE/BANDAGES/DRESSINGS)
BNDG ESMARK 4X9 LF (GAUZE/BANDAGES/DRESSINGS) IMPLANT
BRUSH SCRUB EZ PLAIN DRY (MISCELLANEOUS) ×2 IMPLANT
CORDS BIPOLAR (ELECTRODE) IMPLANT
COVER MAYO STAND STRL (DRAPES) ×2 IMPLANT
COVER TABLE BACK 60X90 (DRAPES) ×2 IMPLANT
CUFF TOURNIQUET SINGLE 18IN (TOURNIQUET CUFF) IMPLANT
DECANTER SPIKE VIAL GLASS SM (MISCELLANEOUS) IMPLANT
DRAPE EXTREMITY T 121X128X90 (DRAPE) ×2 IMPLANT
DRAPE SURG 17X23 STRL (DRAPES) ×2 IMPLANT
GAUZE SPONGE 4X4 12PLY STRL (GAUZE/BANDAGES/DRESSINGS) ×2 IMPLANT
GLOVE BIO SURGEON STRL SZ 6.5 (GLOVE) ×1 IMPLANT
GLOVE BIOGEL M STRL SZ7.5 (GLOVE) ×2 IMPLANT
GLOVE BIOGEL PI IND STRL 7.0 (GLOVE) IMPLANT
GLOVE BIOGEL PI INDICATOR 7.0 (GLOVE) ×2
GLOVE ORTHO TXT STRL SZ7.5 (GLOVE) ×2 IMPLANT
GOWN STRL REUS W/ TWL LRG LVL3 (GOWN DISPOSABLE) ×1 IMPLANT
GOWN STRL REUS W/ TWL XL LVL3 (GOWN DISPOSABLE) ×2 IMPLANT
GOWN STRL REUS W/TWL LRG LVL3 (GOWN DISPOSABLE) ×2
GOWN STRL REUS W/TWL XL LVL3 (GOWN DISPOSABLE) ×4
NEEDLE 27GAX1X1/2 (NEEDLE) IMPLANT
PACK BASIN DAY SURGERY FS (CUSTOM PROCEDURE TRAY) ×2 IMPLANT
PAD CAST 3X4 CTTN HI CHSV (CAST SUPPLIES) ×1 IMPLANT
PADDING CAST ABS 4INX4YD NS (CAST SUPPLIES) ×1
PADDING CAST ABS COTTON 4X4 ST (CAST SUPPLIES) ×1 IMPLANT
PADDING CAST COTTON 3X4 STRL (CAST SUPPLIES) ×2
SPLINT PLASTER CAST XFAST 3X15 (CAST SUPPLIES) ×5 IMPLANT
SPLINT PLASTER XTRA FASTSET 3X (CAST SUPPLIES) ×5
STOCKINETTE 4X48 STRL (DRAPES) ×2 IMPLANT
STRIP CLOSURE SKIN 1/2X4 (GAUZE/BANDAGES/DRESSINGS) ×2 IMPLANT
SUT PROLENE 3 0 PS 2 (SUTURE) ×2 IMPLANT
SYR 3ML 23GX1 SAFETY (SYRINGE) IMPLANT
SYRINGE CONTROL L 12CC (SYRINGE) IMPLANT
SYRINGE CONTROL LL 12CC (SYRINGE) IMPLANT
TOWEL OR 17X24 6PK STRL BLUE (TOWEL DISPOSABLE) ×2 IMPLANT
TRAY DSU PREP LF (CUSTOM PROCEDURE TRAY) ×2 IMPLANT
UNDERPAD 30X30 INCONTINENT (UNDERPADS AND DIAPERS) ×2 IMPLANT

## 2014-05-31 NOTE — Op Note (Signed)
[id number] 

## 2014-05-31 NOTE — Anesthesia Preprocedure Evaluation (Signed)
Anesthesia Evaluation  Patient identified by MRN, date of birth, ID band Patient awake    Reviewed: Allergy & Precautions, H&P , NPO status , Patient's Chart, lab work & pertinent test results  Airway Mallampati: II TM Distance: >3 FB Neck ROM: Full    Dental no notable dental hx. (+) Teeth Intact, Dental Advisory Given   Pulmonary asthma ,  breath sounds clear to auscultation  Pulmonary exam normal       Cardiovascular hypertension, On Medications Rhythm:Regular Rate:Normal     Neuro/Psych negative neurological ROS  negative psych ROS   GI/Hepatic Neg liver ROS, GERD-  Medicated and Controlled,  Endo/Other  negative endocrine ROS  Renal/GU negative Renal ROS  negative genitourinary   Musculoskeletal   Abdominal   Peds  Hematology negative hematology ROS (+)   Anesthesia Other Findings   Reproductive/Obstetrics negative OB ROS                           Anesthesia Physical Anesthesia Plan  ASA: II  Anesthesia Plan: General   Post-op Pain Management:    Induction: Intravenous  Airway Management Planned: LMA  Additional Equipment:   Intra-op Plan:   Post-operative Plan: Extubation in OR  Informed Consent: I have reviewed the patients History and Physical, chart, labs and discussed the procedure including the risks, benefits and alternatives for the proposed anesthesia with the patient or authorized representative who has indicated his/her understanding and acceptance.   Dental advisory given  Plan Discussed with: CRNA  Anesthesia Plan Comments:         Anesthesia Quick Evaluation

## 2014-05-31 NOTE — Op Note (Signed)
NAME:  JEFFRIESAnniebell, Bedore NO.:  [id number]  MEDICAL RECORD NO.:  [id number]  LOCATION:                                 FACILITY:  PHYSICIAN:  Youlanda Mighty. Jahayra Mazo, M.D.      DATE OF BIRTH:  DATE OF PROCEDURE:  05/31/2014 DATE OF DISCHARGE:                              OPERATIVE REPORT   PREOPERATIVE DIAGNOSIS:  Chronic right carpal tunnel syndrome and overuse myofascial pain, right arm.  POSTOPERATIVE DIAGNOSIS:  Chronic right carpal tunnel syndrome and overuse myofascial pain, right arm.  OPERATION:  Release of right transverse carpal ligament.  OPERATING SURGEON:  Youlanda Mighty. Dillyn Menna, M.D.  ASSISTANT:  Surgical technician.  ANESTHESIA:  General by LMA.  SUPERVISED ANESTHESIOLOGIST:  Soledad Gerlach, MD  INDICATIONS:  Lauren Lopez is a 63 year-old woman employed by [employer].  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  She has had the development of significant arm pain and numbness.  She was evaluated at the office and found to have signs of carpal tunnel syndrome and probable myofascial pain due to overuse.  She was referred to see Dr. Catalina Gravel for independent neurologic evaluation. Dr. Catalina Gravel performed a neurologic evaluation and electrodiagnostic studies confirming a right carpal tunnel syndrome.  Due to a failed response to nonoperative measures, Ms. Mukherjee was brought to the operating room at this time for release of her right transverse carpal ligament.  We anticipate 6-8 weeks of recovery before she returns to her very vigorous job.  This should allow her myofascial symptoms to subside.  After informed consent in the office and in the holding area, she was brought to the operating room at this time.  Preoperatively, she was advised of potential risks and benefits of surgery. Dr. Ola Spurr provided detailed anesthesia informed consent.  General anesthesia by LMA technique was recommended  and accepted.  PROCEDURE:  Lauraine Crespo was brought to room 2 of the [facility] and placed in supine position on the operating table.  Following the induction of general anesthesia by LMA technique, the right hand and arm were prepped with Betadine soap and solution, sterilely draped.  A pneumatic tourniquet was applied to the proximal right brachium.  Following exsanguination of the right arm with Esmarch bandage, arterial tourniquet was inflated to 220 mmHg.  A routine surgical time-out was accomplished noting her allergy to metronidazole.  The procedure commenced with a short incision in the line of the ring finger of the proximal palm.  Subcutaneous tissues were carefully divided taking care to identify the palmar fascia.  This was split in the line of its fibers to reveal the common sensory branches of the median nerve and superficial palmar arch.  The distal margin of the transverse carpal ligament was isolated followed by sounding the canal with a Insurance account manager.  The ulnar aspect of the transverse carpal ligament was then released with scissors sequentially into the distal forearm.  The volar forearm fascia was released subcutaneously.  The ulnar bursa was thickened and fibrotic.  No masses or other predicaments were noted.  Bleeding points along the margin of the released ligament and along the  skin margins were electrocauterized with bipolar forceps.  The wound was then repaired with intradermal 3-0 Prolene and Steri- Strips.  A 2% lidocaine was infiltrated along the wound margins for postoperative comfort.  Ms. Timothy was placed in compressive dressing of sterile gauze, sterile Webril, and a volar plaster splint with the wrist in 15 degrees of dorsiflexion.  For aftercare, she has a prescription for hydrocodone at home.  She does not require prescription from the [facility].     Youlanda Mighty Avis Mcmahill, M.D.     RVS/MEDQ  D:  05/31/2014  T:   05/31/2014  Job:  [id number]

## 2014-05-31 NOTE — Anesthesia Postprocedure Evaluation (Signed)
  Anesthesia Post-op Note  Patient: Lauren Lopez  Procedure(s) Performed: Procedure(s): RIGHT CARPAL TUNNEL RELEASE (Right)  Patient Location: PACU  Anesthesia Type:General  Level of Consciousness: awake and alert   Airway and Oxygen Therapy: Patient Spontanous Breathing  Post-op Pain: none  Post-op Assessment: Post-op Vital signs reviewed, Patient's Cardiovascular Status Stable and Respiratory Function Stable  Post-op Vital Signs: Reviewed  Filed Vitals:   05/31/14 0845  BP: 136/79  Pulse: 53  Temp:   Resp: 13    Complications: No apparent anesthesia complications

## 2014-05-31 NOTE — Transfer of Care (Signed)
Immediate Anesthesia Transfer of Care Note  Patient: Lauren Lopez  Procedure(s) Performed: Procedure(s): RIGHT CARPAL TUNNEL RELEASE (Right)  Patient Location: PACU  Anesthesia Type:General  Level of Consciousness: sedated  Airway & Oxygen Therapy: Patient Spontanous Breathing and Patient connected to face mask oxygen  Post-op Assessment: Report given to PACU RN and Post -op Vital signs reviewed and stable  Post vital signs: Reviewed and stable  Complications: No apparent anesthesia complications

## 2014-05-31 NOTE — Brief Op Note (Signed)
05/31/2014  8:12 AM  PATIENT:  Lauren Lopez  63 y.o. female  PRE-OPERATIVE DIAGNOSIS:  Right Carpal tunnel syndrome  POST-OPERATIVE DIAGNOSIS:  right carpal tunnel syndrome  PROCEDURE:  Procedure(s): RIGHT CARPAL TUNNEL RELEASE (Right)  SURGEON:  Surgeon(s) and Role:    * Cammie Sickle, MD - Primary  PHYSICIAN ASSISTANT:   ASSISTANTS: surgical tech   ANESTHESIA:   general  EBL:  Total I/O In: 1000 [I.V.:1000] Out: -   BLOOD ADMINISTERED:none  DRAINS: none   LOCAL MEDICATIONS USED:  XYLOCAINE   SPECIMEN:  No Specimen  DISPOSITION OF SPECIMEN:  N/A  COUNTS:  YES  TOURNIQUET:   Total Tourniquet Time Documented: Upper Arm (Right) - 14 minutes Total: Upper Arm (Right) - 14 minutes   DICTATION: .Other Dictation: Dictation Number [id number]  PLAN OF CARE: Discharge to home after PACU  PATIENT DISPOSITION:  PACU - hemodynamically stable.   Delay start of Pharmacological VTE agent (>24hrs) due to surgical blood loss or risk of bleeding: not applicable

## 2014-05-31 NOTE — Anesthesia Procedure Notes (Signed)
Procedure Name: LMA Insertion Date/Time: 05/31/2014 7:44 AM Performed by: Toula Moos L Pre-anesthesia Checklist: Patient identified, Emergency Drugs available, Suction available, Patient being monitored and Timeout performed Patient Re-evaluated:Patient Re-evaluated prior to inductionOxygen Delivery Method: Circle System Utilized Preoxygenation: Pre-oxygenation with 100% oxygen Intubation Type: IV induction Ventilation: Mask ventilation without difficulty LMA: LMA inserted LMA Size: 4.0 Number of attempts: 1 Airway Equipment and Method: bite block Placement Confirmation: positive ETCO2 and breath sounds checked- equal and bilateral Tube secured with: Tape Dental Injury: Teeth and Oropharynx as per pre-operative assessment

## 2014-05-31 NOTE — Discharge Instructions (Addendum)

## 2014-06-04 ENCOUNTER — Encounter (HOSPITAL_BASED_OUTPATIENT_CLINIC_OR_DEPARTMENT_OTHER): Payer: Self-pay | Admitting: Orthopedic Surgery

## 2014-06-28 ENCOUNTER — Encounter: Payer: Self-pay | Admitting: Emergency Medicine

## 2014-07-02 ENCOUNTER — Ambulatory Visit (INDEPENDENT_AMBULATORY_CARE_PROVIDER_SITE_OTHER): Payer: BC Managed Care – PPO | Admitting: Emergency Medicine

## 2014-07-02 ENCOUNTER — Encounter: Payer: Self-pay | Admitting: Emergency Medicine

## 2014-07-02 VITALS — BP 118/64 | HR 60 | Temp 97.7°F | Resp 18 | Ht 62.5 in | Wt 204.8 lb

## 2014-07-02 DIAGNOSIS — I10 Essential (primary) hypertension: Secondary | ICD-10-CM

## 2014-07-02 DIAGNOSIS — R32 Unspecified urinary incontinence: Secondary | ICD-10-CM

## 2014-07-02 DIAGNOSIS — E782 Mixed hyperlipidemia: Secondary | ICD-10-CM

## 2014-07-02 DIAGNOSIS — R7309 Other abnormal glucose: Secondary | ICD-10-CM

## 2014-07-02 DIAGNOSIS — R239 Unspecified skin changes: Secondary | ICD-10-CM

## 2014-07-02 DIAGNOSIS — Z79899 Other long term (current) drug therapy: Secondary | ICD-10-CM

## 2014-07-02 DIAGNOSIS — Z0000 Encounter for general adult medical examination without abnormal findings: Secondary | ICD-10-CM

## 2014-07-02 DIAGNOSIS — E559 Vitamin D deficiency, unspecified: Secondary | ICD-10-CM

## 2014-07-02 LAB — CBC WITH DIFFERENTIAL/PLATELET
Basophils Absolute: 0 10*3/uL (ref 0.0–0.1)
Basophils Relative: 0 % (ref 0–1)
EOS ABS: 0.1 10*3/uL (ref 0.0–0.7)
EOS PCT: 2 % (ref 0–5)
HEMATOCRIT: 39.3 % (ref 36.0–46.0)
Hemoglobin: 12.7 g/dL (ref 12.0–15.0)
LYMPHS ABS: 2.9 10*3/uL (ref 0.7–4.0)
Lymphocytes Relative: 51 % — ABNORMAL HIGH (ref 12–46)
MCH: 26.4 pg (ref 26.0–34.0)
MCHC: 32.3 g/dL (ref 30.0–36.0)
MCV: 81.7 fL (ref 78.0–100.0)
MONO ABS: 0.4 10*3/uL (ref 0.1–1.0)
Monocytes Relative: 8 % (ref 3–12)
Neutro Abs: 2.2 10*3/uL (ref 1.7–7.7)
Neutrophils Relative %: 39 % — ABNORMAL LOW (ref 43–77)
PLATELETS: 308 10*3/uL (ref 150–400)
RBC: 4.81 MIL/uL (ref 3.87–5.11)
RDW: 14.8 % (ref 11.5–15.5)
WBC: 5.6 10*3/uL (ref 4.0–10.5)

## 2014-07-02 NOTE — Patient Instructions (Addendum)
Discussed hygiene with Gold Bond powder when working. Epsom salt soaks/ Vaseline overnight treatments every other night.  Yeast Infection of the Skin Some yeast on the skin is normal, but sometimes it causes an infection. If you have a yeast infection, it shows up as white or light brown patches on brown skin. You can see it better in the summer on tan skin. It causes light-colored holes in your suntan. It can happen on any area of the body. This cannot be passed from person to person. HOME CARE  Scrub your skin daily with a dandruff shampoo. Your rash may take a couple weeks to get well.  Do not scratch or itch the rash. GET HELP RIGHT AWAY IF:   You get another infection from scratching. The skin may get warm, red, and may ooze fluid.  The infection does not seem to be getting better. MAKE SURE YOU:  Understand these instructions.  Will watch your condition.  Will get help right away if you are not doing well or get worse. Document Released: 10/29/2008 Document Revised: 02/08/2012 Document Reviewed: 10/29/2008 [facility] Patient Information 2015 [place], [place]. This information is not intended to replace advice given to you by your health care provider. Make sure you discuss any questions you have with your health care provider. Urinary Incontinence Urinary incontinence is the involuntary loss of urine from your bladder. CAUSES  There are many causes of urinary incontinence. They include:  Medicines.  Infections.  Prostatic enlargement, leading to overflow of urine from your bladder.  Surgery.  Neurological diseases.  Emotional factors. SIGNS AND SYMPTOMS Urinary Incontinence can be divided into four types: 1. Urge incontinence. Urge incontinence is the involuntary loss of urine before you have the opportunity to go to the bathroom. There is a sudden urge to void but not enough time to reach a bathroom. 2. Stress incontinence. Stress incontinence is the sudden loss of urine  with any activity that forces urine to pass. It is commonly caused by anatomical changes to the pelvis and sphincter areas of your body. 3. Overflow incontinence. Overflow incontinence is the loss of urine from an obstructed opening to your bladder. This results in a backup of urine and a resultant buildup of pressure within the bladder. When the pressure within the bladder exceeds the closing pressure of the sphincter, the urine overflows, which causes incontinence, similar to water overflowing a dam. 4. Total incontinence. Total incontinence is the loss of urine as a result of the inability to store urine within your bladder. DIAGNOSIS  Evaluating the cause of incontinence may require:  A thorough and complete medical and obstetric history.  A complete physical exam.  Laboratory tests such as a urine culture and sensitivities. When additional tests are indicated, they can include:  An ultrasound exam.  Kidney and bladder X-rays.  Cystoscopy. This is an exam of the bladder using a narrow scope.  Urodynamic testing to test the nerve function to the bladder and sphincter areas. TREATMENT  Treatment for urinary incontinence depends on the cause:  For urge incontinence caused by a bacterial infection, antibiotics will be prescribed. If the urge incontinence is related to medicines you take, your health care provider may have you change the medicine.  For stress incontinence, surgery to re-establish anatomical support to the bladder or sphincter, or both, will often correct the condition.  For overflow incontinence caused by an enlarged prostate, an operation to open the channel through the enlarged prostate will allow the flow of urine out of  the bladder. In women with fibroids, a hysterectomy may be recommended.  For total incontinence, surgery on your urinary sphincter may help. An artificial urinary sphincter (an inflatable cuff placed around the urethra) may be required. In women who  have developed a hole-like passage between their bladder and vagina (vesicovaginal fistula), surgery to close the fistula often is required. HOME CARE INSTRUCTIONS  Normal daily hygiene and the use of pads or adult diapers that are changed regularly will help prevent odors and skin damage.  Avoid caffeine. It can overstimulate your bladder.  Use the bathroom regularly. Try about every 2-3 hours to go to the bathroom, even if you do not feel the need to do so. Take time to empty your bladder completely. After urinating, wait a minute. Then try to urinate again.  For causes involving nerve dysfunction, keep a log of the medicines you take and a journal of the times you go to the bathroom. SEEK MEDICAL CARE IF:  You experience worsening of pain instead of improvement in pain after your procedure.  Your incontinence becomes worse instead of better. SEE IMMEDIATE MEDICAL CARE IF:  You experience fever or shaking chills.  You are unable to pass your urine.  You have redness spreading into your groin or down into your thighs. MAKE SURE YOU:   Understand these instructions.   Will watch your condition.  Will get help right away if you are not doing well or get worse. Document Released: 12/24/2004 Document Revised: 09/06/2013 Document Reviewed: 04/25/2013 [facility] Patient Information 2015 [place], [place]. This information is not intended to replace advice given to you by your health care provider. Make sure you discuss any questions you have with your health care provider.

## 2014-07-02 NOTE — Progress Notes (Signed)
Subjective:    Patient ID: Lauren Lopez, female    DOB: May 05, 1951, 63 y.o.   MRN: [id number]  HPI Comments: 63 yo pleasant AAF CPE and presents for 3 month F/U for HTN, Cholesterol, Pre-Dm, D. deficient She has decreased fried foods. She has decreased activity since recent wrist surgery. She has noted weight gain with decreased cardio. She notes BP good and primarily takes HCTZ for edema, which is staying controlled for the most part.  She notes GERD/ breathing is stable with medications and denies any concerns.  She notes mild fatigue increase with decreased activity level since surgery.  She is still in PT for wrist with decreased strength and mild continued edema. She has f/u with DR Sypher soon.   She has started using antifungal cream on feet because of peeling since heat index has gone up. She denies odor/ itch but was concerned for fungus. She notes minimal relief with cream.  WBC             8.2   03/19/2014 HGB            14.0   05/31/2014 HCT            38.8   03/19/2014 PLT             364   03/19/2014 GLUCOSE          92   05/30/2014 CHOL            215   02/12/2014 TRIG            159   02/12/2014 HDL              86   02/12/2014 LDLCALC          97   02/12/2014 ALT              17   02/12/2014 AST              22   02/12/2014 NA              140   05/30/2014 K               3.4   05/30/2014 CL               99   05/30/2014 CREATININE     0.84   05/30/2014 BUN              12   05/30/2014 CO2              24   05/30/2014 TSH           2.938   02/12/2014 HGBA1C          6.2   02/12/2014     Medication List       This list is accurate as of: 07/02/14 11:59 PM.  Always use your most recent med list.               budesonide-formoterol 160-4.5 MCG/ACT inhaler  Commonly known as:  SYMBICORT  Inhale 2 puffs into the lungs 2 (two) times daily.     CALCIUM 600 600 MG Tabs tablet  Generic drug:  calcium carbonate  Take 600 mg by mouth daily.     cholecalciferol 1000 UNITS tablet   Commonly known as:  VITAMIN D  Take 1,000 Units by mouth daily.     cyanocobalamin 100 MCG tablet  Take 100 mcg by mouth daily.  estrogens conjugated (synthetic B) 0.45 MG tablet  Commonly known as:  ENJUVIA  Take 0.45 mg by mouth daily.     fexofenadine 180 MG tablet  Commonly known as:  ALLEGRA  Take 180 mg by mouth daily as needed.     hydrochlorothiazide 25 MG tablet  Commonly known as:  HYDRODIURIL  Take 25 mg by mouth daily.     MAGNESIUM PO  Take 250 mg by mouth daily.     multivitamin with minerals tablet  Take 1 tablet by mouth daily.     HAIR/SKIN/NAILS PO  Take by mouth daily.     RABEprazole 20 MG tablet  Commonly known as:  ACIPHEX  Take 20 mg by mouth daily.       Allergies  Allergen Reactions  . Metronidazole Other (See Comments)    unknown  . Ppd [Tuberculin Purified Protein Derivative] Other (See Comments)    + PPD 2013 with NEG CXR 05/2012    Past Medical History  Diagnosis Date  . Pneumonia 5/13  . Multiple allergies   . GERD (gastroesophageal reflux disease)   . Arthritis   . Bilateral carpal tunnel syndrome   . Wears glasses   . Wears dentures     upper  . Positive PPD   . Right rotator cuff tear 01/20/2013  . Hypertension   . Hyperlipidemia   . Unspecified vitamin D deficiency   . Asthma   . Anemia   . Past Surgical History  Procedure Laterality Date  . Bilateral salpingoophorectomy  11/28/2010    open laparoscopy with adhesiolysis also  . Knee arthroscopy  2005    rt  . Colonoscopy    . Carpal tunnel release  07/15/2012    Procedure: CARPAL TUNNEL RELEASE;  Surgeon: Cammie Sickle., MD;  Location: [facility];  Service: Orthopedics;  Laterality: Left;  . Shoulder arthroscopy with rotator cuff repair and subacromial decompression Right 01/20/2013    Procedure: RIGHT ARTHROSCOPY SHOULDER DEBRIDEMENT LIMITED, ARTHROSCOPY SHOULDER DECOMPRESSION SUBACROMIAL PARTIAL ACROMIOPLASTY WITH CORACOACROMIAL RELEASE,  ROTATOR CUFF REPAIR ;  Surgeon: Johnny Bridge, MD;  Location: [facility];  Service: Orthopedics;  Laterality: Right;  RIGHT SHOULDER SCOPE DEBRIDEMENT, ACRIOMIOPLASTY, ROTATOR CUFF REPAIR  . Abdominal hysterectomy    . Carpal tunnel release Right 05/31/2014    Procedure: RIGHT CARPAL TUNNEL RELEASE;  Surgeon: Cammie Sickle, MD;  Location: [facility];  Service: Orthopedics;  Laterality: Right;   History  Substance Use Topics  . Smoking status: Never Smoker   . Smokeless tobacco: Not on file  . Alcohol Use: No   Family History  Problem Relation Age of Onset  . Hypertension Mother   . Diabetes Mother   . Stroke Mother   . Hypertension Father   . Heart disease Brother   . Hyperlipidemia Brother   . Diabetes Brother     MAINTENANCE: Colonoscopy:08/10/13 with polyp/ diverticulosis EGD:06/2008 Mammo:11/15/13 BMD:2009 @ [facility] wnl Pap/ Pelvic:2011 EYE:2014 WNL F/U TODAY Dentist:Q 61MONTH ALLERGIST: Q 6 MONTH  IMMUNIZATIONS: Tdap:07/08/09 Pneumovax:06/27/13 Zostavax: Influenza:  Patient Care Team: Unk Pinto, MD as PCP - General (Internal Medicine) Cammie Sickle, MD as Consulting Physician (Orthopedic Surgery) Johna Sheriff, MD as Consulting Physician (Ophthalmology) Mayme Genta, MD as Consulting Physician (Gastroenterology) Cheri Fowler, MD as Consulting Physician (Obstetrics and Gynecology) Gearlean Alf, MD as Consulting Physician (Orthopedic Surgery) Johnny Bridge, MD as Consulting Physician (Orthopedic Surgery) Mosetta Anis, MD as Referring Physician (Allergy)  Larey Dresser, MD as Consulting Physician (Cardiology) Mariea Clonts, (DENTIST)  Review of Systems BP 118/64  Pulse 60  Temp(Src) 97.7 F (36.5 C)  Resp 18  Ht 5' 2.5" (1.588 m)  Wt 204 lb 12.8 oz (92.897 kg)  BMI 36.84 kg/m2     Objective:   Physical Exam  Nursing note and vitals reviewed. Constitutional: She is oriented to person, place, and time. She  appears well-developed and well-nourished. No distress.  obese  HENT:  Head: Normocephalic and atraumatic.  Right Ear: External ear normal.  Left Ear: External ear normal.  Nose: Nose normal.  Mouth/Throat: Oropharynx is clear and moist.  Eyes: Conjunctivae and EOM are normal. Pupils are equal, round, and reactive to light. Right eye exhibits no discharge. Left eye exhibits no discharge. No scleral icterus.  Neck: Normal range of motion. Neck supple. No JVD present. No tracheal deviation present. No thyromegaly present.  Cardiovascular: Normal rate, regular rhythm, normal heart sounds and intact distal pulses.   Pulmonary/Chest: Effort normal and breath sounds normal.  Abdominal: Soft. Bowel sounds are normal. She exhibits no distension and no mass. There is no tenderness. There is no rebound and no guarding.  Genitourinary:  Def gyn  Musculoskeletal: Normal range of motion. She exhibits edema and tenderness.  Right wrist- mild decreased ROM/ Strength  Lymphadenopathy:    She has no cervical adenopathy.  Neurological: She is alert and oriented to person, place, and time. She has normal reflexes. No cranial nerve deficit. She exhibits normal muscle tone. Coordination normal.  Skin: Skin is warm and dry. No rash noted. No erythema. No pallor.  Feet dry minimal scaling around edges/ toes  Psychiatric: She has a normal mood and affect. Her behavior is normal. Judgment and thought content normal.   AORTA SCAN WNL EKG NSCSPT    Assessment & Plan:  1. CPE- Update screening labs/ History/ Immunizations/ Testing as needed. Advised healthy diet, QD exercise, increase H20 and continue RX/ Vitamins AD.  2. Dry feet- Discussed hygiene with Gold Bond powder when working. Epsom salt soaks/ Vaseline overnight treatments every other night. Call with results  3. 3 month F/U for HTN, Cholesterol, Pre-Dm, D. Deficient. Needs healthy diet, cardio QD and obtain healthy weight. Check Labs, Check BP if  >130/80 call office  4. Urine incontinence- Refer to Urology with multiple NEG UA C/S  5. Wrist pain/ weakness/ edema- continue f/u PT and ortho

## 2014-07-03 LAB — HEPATIC FUNCTION PANEL
ALBUMIN: 4 g/dL (ref 3.5–5.2)
ALK PHOS: 96 U/L (ref 39–117)
ALT: 17 U/L (ref 0–35)
AST: 23 U/L (ref 0–37)
BILIRUBIN INDIRECT: 0.5 mg/dL (ref 0.2–1.2)
BILIRUBIN TOTAL: 0.6 mg/dL (ref 0.2–1.2)
Bilirubin, Direct: 0.1 mg/dL (ref 0.0–0.3)
Total Protein: 6.8 g/dL (ref 6.0–8.3)

## 2014-07-03 LAB — LIPID PANEL
CHOL/HDL RATIO: 2.5 ratio
Cholesterol: 198 mg/dL (ref 0–200)
HDL: 78 mg/dL (ref >39)
LDL CALC: 105 mg/dL — AB (ref 0–99)
Triglycerides: 77 mg/dL (ref <150)
VLDL: 15 mg/dL (ref 0–40)

## 2014-07-03 LAB — URINALYSIS, ROUTINE W REFLEX MICROSCOPIC
BILIRUBIN URINE: NEGATIVE
Glucose, UA: NEGATIVE mg/dL
Hgb urine dipstick: NEGATIVE
KETONES UR: NEGATIVE mg/dL
Leukocytes, UA: NEGATIVE
Nitrite: NEGATIVE
Protein, ur: NEGATIVE mg/dL
Specific Gravity, Urine: 1.005 (ref 1.005–1.030)
UROBILINOGEN UA: 0.2 mg/dL (ref 0.0–1.0)
pH: 7 (ref 5.0–8.0)

## 2014-07-03 LAB — BASIC METABOLIC PANEL WITH GFR
BUN: 11 mg/dL (ref 6–23)
CO2: 24 mEq/L (ref 19–32)
Calcium: 9.3 mg/dL (ref 8.4–10.5)
Chloride: 105 mEq/L (ref 96–112)
Creat: 0.74 mg/dL (ref 0.50–1.10)
GFR, Est African American: >89 mL/min
GFR, Est Non African American: 87 mL/min
GLUCOSE: 95 mg/dL (ref 70–99)
Potassium: 4.3 mEq/L (ref 3.5–5.3)
Sodium: 141 mEq/L (ref 135–145)

## 2014-07-03 LAB — TSH: TSH: 0.964 u[IU]/mL (ref 0.350–4.500)

## 2014-07-03 LAB — MICROALBUMIN / CREATININE URINE RATIO
CREATININE, URINE: 18.3 mg/dL
Microalb Creat Ratio: 27.3 mg/g (ref 0.0–30.0)
Microalb, Ur: 0.5 mg/dL (ref 0.00–1.89)

## 2014-07-03 LAB — VITAMIN D 25 HYDROXY (VIT D DEFICIENCY, FRACTURES): VIT D 25 HYDROXY: 38 ng/mL (ref 30–89)

## 2014-07-03 LAB — MAGNESIUM: MAGNESIUM: 1.7 mg/dL (ref 1.5–2.5)

## 2014-07-03 LAB — URINE CULTURE
Colony Count: NO GROWTH
Organism ID, Bacteria: NO GROWTH

## 2014-07-03 LAB — INSULIN, FASTING: Insulin fasting, serum: 20 u[IU]/mL (ref 3–28)

## 2014-07-03 LAB — HEMOGLOBIN A1C
Hgb A1c MFr Bld: 6.6 % — ABNORMAL HIGH (ref <5.7)
Mean Plasma Glucose: 143 mg/dL — ABNORMAL HIGH (ref <117)

## 2014-10-10 ENCOUNTER — Ambulatory Visit (INDEPENDENT_AMBULATORY_CARE_PROVIDER_SITE_OTHER): Payer: BC Managed Care – PPO | Admitting: Physician Assistant

## 2014-10-10 ENCOUNTER — Encounter: Payer: Self-pay | Admitting: Physician Assistant

## 2014-10-10 VITALS — BP 118/60 | HR 64 | Temp 98.2°F | Resp 18 | Wt 195.0 lb

## 2014-10-10 DIAGNOSIS — L301 Dyshidrosis [pompholyx]: Secondary | ICD-10-CM

## 2014-10-10 DIAGNOSIS — I10 Essential (primary) hypertension: Secondary | ICD-10-CM

## 2014-10-10 DIAGNOSIS — E876 Hypokalemia: Secondary | ICD-10-CM

## 2014-10-10 DIAGNOSIS — J45909 Unspecified asthma, uncomplicated: Secondary | ICD-10-CM

## 2014-10-10 DIAGNOSIS — IMO0002 Reserved for concepts with insufficient information to code with codable children: Secondary | ICD-10-CM

## 2014-10-10 DIAGNOSIS — E119 Type 2 diabetes mellitus without complications: Secondary | ICD-10-CM | POA: Insufficient documentation

## 2014-10-10 DIAGNOSIS — E1165 Type 2 diabetes mellitus with hyperglycemia: Secondary | ICD-10-CM

## 2014-10-10 DIAGNOSIS — E559 Vitamin D deficiency, unspecified: Secondary | ICD-10-CM

## 2014-10-10 DIAGNOSIS — E785 Hyperlipidemia, unspecified: Secondary | ICD-10-CM

## 2014-10-10 DIAGNOSIS — K219 Gastro-esophageal reflux disease without esophagitis: Secondary | ICD-10-CM

## 2014-10-10 MED ORDER — RABEPRAZOLE SODIUM 20 MG PO TBEC: 20.0000 mg | DELAYED_RELEASE_TABLET | Freq: Every day | ORAL | Status: DC

## 2014-10-10 MED ORDER — GLUCOSE BLOOD VI STRP: ORAL_STRIP | Status: AC

## 2014-10-10 NOTE — Patient Instructions (Addendum)
Hypertension: Continue medication, monitor blood pressure at home. Continue DASH diet.  Reminder to go to the ER if any CP, SOB, nausea, dizziness, severe HA, changes vision/speech, left arm numbness and tingling and jaw pain. Cholesterol: Continue diet and exercise. Check cholesterol.  Diabetes-Continue diet and exercise. Check A1C Check your sugar once a day. Buy compression stockings to help with leg swelling. Please follow up in 3 months with Dr. Melford Aase.  Information about Metformin: Metformin does not cause low blood sugars. In order to create energy your cells need insulin and sugar but sometime your cells do not accept the insulin and this can cause increased sugars and decreased energy. The Metformin helps your cells accept insulin and the sugar to give you more energy.   The two most common side effects are nausea and diarrhea, follow these rules to avoid it! You can take imodium per box instructions when starting metformin if needed.   Rules of metformin: 1) start out slow with only one pill daily. Our goal for you is 4 pills a day or 2000mg  total.  2) take with your largest meal. 3) Take with least amount of carbs.   Call if you have any problems.   Vitamin D Def- check level and continue medications.     Bad carbs also include fruit juice, alcohol, and sweet tea. These are empty calories that do not signal to your brain that you are full.   Please remember the good carbs are still carbs which convert into sugar. So please measure them out no more than 1/2-1 cup of rice, oatmeal, pasta, and beans  Veggies are however free foods! Pile them on.   Not all fruit is created equal. Please see the list below, the fruit at the bottom is higher in sugars than the fruit at the top. Please avoid all dried fruits.      1)The amount of food you eat is important  -Too much can increase glucose levels and cause you to gain weight (which can  also increase glucose levels.  -Too little  can decrease glucose level to unsafe levels (<70) 2)Eat meals and snacks at the same time each day to help your diabetes medication help you.  If you eat at different times each day, then the medication will not be as effective. 3)Do NOT skip meals or eat meals later than usual.  If you skip meals, then your glucose level can go low (<70).  Eating meals later than usual will not help the medication work effectively. 4) Amount of carbohydrates (carbs) per meal  -Breakfast- 30-45 grams of carbs  -Lunch and Dinner- 45-60 grams of carbs  -Snacks- 15-30 grams of carbs 5)Low fat foods have no more than 3 grams of fat per serving.  -Saturated- look for less than 1 gram per serving  -Trans Fat- look for 0 grams per serving 6)Exercise at least 120 minutes per week  Exercise Benefits:   -Lower LDL (Bad cholesterol)   -Lower blood pressure   -Increase HDL (Good cholesterol)   -Strengthen heart, lungs and muscles   -Burn calories and relieve stress   -Sleep better and help you feel better overall  To lower your risk of heart disease, limit your intake of saturated fat and trans fat as much as possible. THE GOOD WHAT IT DOES WHERE IT'S FOUND  MONOUNSATURATED FAT Lowers LDL and maybe raises HDL cholesterol Canola oil, olives, olive oil, peanuts, peanut oil, avocados, nuts  POLYUNSATURATED FAT Lowers LDL cholesterol Corn, safflower, sunflower  and soybean oils, nuts, seeds  OMEGA-3 FATTY ACIDS Lowers triglycerides (blood fats) and blood pressure Salmon, mackerel, herring, sardines, flax seed, flaxseed oil, walnuts, soybean oil  THE BAD WHAT IT DOES WHERE IT'S FOUND  SATURATED FAT Raises LDL (bad) cholesterol Butter, shortening, lard, red meat, cheese, whole milk, ice cream, coconut and palm oils  TRANS FAT Raises LDL cholesterol, lowers HDL (good) cholesterol Fried foods, some stick margarines, some cookies and crackers (look for hydrogenated fat on the ingredient list)  CHOLESTEROL FROM FOOD Too much may  raise cholesterol levels Meat, poultry, seafood, eggs, milk, cheese, yogurt, butter   Plate Method (How much food of each food group) 1) Fill one half of your plate with nonstarchy vegetables: lettuce, broccoli, green beans, spinach, carrots or peppers. 2) Fill one quarter with protein: chicken, [place], fish, lean meat, eggs or tofu. 3) Fill one quarter with a nutritious carbohydrate food: brown rice, whole-wheat pasta, whole-wheat bread, peas, or corn.  Choose whole-wheat carbs for extra nutrition.  Controlling carbs helps you control your blood glucose. 4) Include a small piece of fruit at each meal, as well as 8 ounces of lowfat milk or yogurt. 5) Add 1-2 teaspoons of heart-healthy fat, such as olive or canola oil, trans fat-free margarine, avocado, nuts or seeds.  Ten Tips- Keys to Success for Managing Diabetes 1) Set realistic, achievable goals and expectations for yourself and for your diabetes.  - Make a list of some "quick to fix" goals.  For example- check blood glucose 2  hours after supper, or change all sodas to diet soda.  -Set a date and time to check progress and add new goals. 2) Monitor your progress  -Keep a diary of your progress and slips on a daily basis.  Slips are normal and  to be expected. Research shows that people who routinely monitor their  progress are more likely to maintain weight loss and fitness goals.  -Don't try to do everything at once. Make changes gradually.  -Learn from what has worked for you in the past and do it again! 3) Plan ahead: what will make it easier for you to stick to your diabetes plan?  -Plan for high-risk situations (such as parties) where you are likely to slip.  -Give yourself a variety of choices and alternatives that might work.  -Contact support people you can trust family, friends, spouses, coworkers.  -Join clubs or support groups and tell them what you need.  -List ways to reward yourself and praise yourself for positive changes,  rather  than berate yourself for slips. 4) Be aware of the stumbling blocks  -Identify  Barriers that can make it hard to take care of your diabetes.  Are you  tempted to eat second portions of food?  Is it hard to check your blood glucose at  work or school?  -By focusing on major stumbling blocks to your diabetes care, you can find ways  around them. 5) Learning about diabetes is lifelong  -Treatments for diabetes are constantly improving based on new research.  Stay  informed.  -Learn as much as you can about meal planning, physical activity, medications  and reducing risks for complications. Bring questions to the office to discuss. 6) Manage stress and your emotional health.    -Pay attention to factors in your life that cause tension, wear you down or make  you anxious.  Work with your healthcare team to reduce stresses.  -Recognize early signs of depression and talk with your  provider. 7) Be Prepared  -Unlike people who do not have diabetes, you may have to plan aspects of your  day.  Also, you need to be ready to handle high and low blood sugar levels.   Being prepared can help you feel more at ease and in control.  -Practice problem-solving.  Ask yourself what if.Marland Kitchen.(I forgot my medicine; I eat too  much; I get sick; my blood glucose goes too high/low) and know how to respond  ahead of an emergency.  -Remember that you can continue to do the things that you liked to do before  you got diabetes. 8) Be Realistic  -You are not always going to be in perfect control of your diabetes.  You will  have good days and bad days.  That is OK.  -When you occasionally have a bad day, do not dwell on it.  Simply try to  continue without guilt or blame.  The goal is to do the best that you can, and  accept that no one is perfect. 9) Ask for support  -Tell your family and friends that you appreciate their support.  Let them know  what is and isn't helpful to you (something that is supportive to one person  may  not be supportive to another.  -Tell family and friends specific ways they can help you follow your treatment  program. 10) Build a healthcare team  -You do not have to it alone. Doctors, Engineer, materials, nurse practitioners,  nurses, diabetes educators and behavioral specialists can help you solve the  tough problems and feel more confident about caring for your diabetes.  Daily Foot Care for Diabetics  -Every day, look at your feet for dry skin, cracks, cuts or redness.  Be sure to check the bottoms of your feet.  If you cannot bend over to see your feet, place a hand mirror on the floor and hold each foot over it.  If you have trouble seeing, ask someone to check your feet for you.  -Call the office if you find cuts or sores that do NOT seem to be healing. -Redness, swelling and increased warmth are signs of infection.  Wash your feet in warm, soapy water every day.  Do NOT soak your feet; soaking softens the skin and increases your risk for infection.  Never use hot water, especially if you have nerve damage in your feet.  Use gentle soap.  Rinse your feet well and dry them carefully, especially between your toes.  After you have washed your feet, put lotion on them to keep the skin from getting dry.  Do not put lotion between your toes.  If your feet are very dry, apply the lotion at bedtime and put on clean, dry socks.  Put talcum powder on your feet if they sweat.  Dust off any extra powder.  Choose unscented moisturizers that do NOT contain alcohol. Petroleum jelly and lanolin are good choices.  File your toenails with an Tax adviser.  Never use scissors or clippers because you might cut yourself and get an infection.  File your nails to follow the shape of your toes.  Round the edges.  Do NOT file them shorter than the ends of your toes.  Ask someone to help you if you do not see well, or have a foot doctor (podiatrist) trim your toenails.  See a podiatrist if you have corns,  calluses, ingrown toenails or fungal nail infections.  Never perform "bathroom surgery" on your feet.  Wear good shoes- buy shoes that protect and cover your feet and that fit you well.  When you buy shoes, shop in the afternoon when your feet are slightly swollen, and wear socks or stockings that you plan to use with them.  You may need specially made shoes if you have crooked toes or foot deformities.  Check the insides of your shoes for pebbles or other objects before you put them on.  Break in new shoes gradually to prevent blisters.  Don't Go Barefoot- Always wear shoes, sandals or flip-flops, even when you are on the beach.  Wear sturdy slippers when you are home.  Wear swimming shoes when you are on the beach or swimming in a pool or lake.  Wear the right socks and stockings- wear socks made of material that wicks moisture away from the skin.  Wear a clean pair of socks every day, and change your socks if your feet sweat a lot.  Choose socks that are free of seams and darns.  Don't wear socks or stockings that are too tight at the top.    -Get a foot exam at least once a year. -Protect your feet from very hot or very cold temperatures.  Avoid hot baths, heating pads, electric blankets, hot pavement and hot sand. -Quit Smoking if you do smoke- smoking decreases the amount of blood flowing to the foot. -Take care of cuts and scratches right away  -Wash them with warm water and soap. DO NOT SOAK.  -Use a gentle medicine such as ST 37, Bacitracin or Neosporin  -Cover with gauze and paper tape.   -Do NOT use adhesive tape on your skin   -Avoid using plastic bandages.  -Check the cut or scratch two times a day.  -Stay off your feet as much as possible.  Call you doctor if: cuts or scratches do not heal within 48 hours, you notice any drainage, you are unsure what to do.  See a podiatrist regularly if you have poor circulation, nerve damage or thick toenails.

## 2014-10-10 NOTE — Progress Notes (Signed)
Assessment and Plan:  1. Essential hypertension Continue medication, monitor blood pressure at home. Continue DASH diet.  Recommended compression stocking for leg swelling and prevention due to standing on feet all day at work.  Reminder to go to the ER if any CP, SOB, nausea, dizziness, severe HA, changes vision/speech, left arm numbness and tingling and jaw pain. - CBC with Differential - BASIC METABOLIC PANEL WITH GFR - Hepatic function panel - TSH  2. Asthma, unspecified asthma severity, uncomplicated -Continue Advair as prescribed.  I was going to switch to [facility] due to free 12 month coupon offer, but ran out of coupons.  I will keep an eye out for any more [employer] coupons.    3. Gastroesophageal reflux disease, esophagitis presence not specified Continue Rabeprazole/Aciphex as prescribed.  Gave refill today.  4. Hypopotassemia Last level on 07/02/14 was 4.3. Ordered labs and will monitor.  5. Hyperlipidemia Continue Red Yeast Rice Extract- 1200mg  daily.  Continue diet and exercise. Check cholesterol.  - Lipid panel  6. Vitamin D deficiency Continue Vitamin D OTC 5,000 IU daily.  Check level. - Vit D  25 hydroxy (rtn osteoporosis monitoring)  7. Diabetes type 2, uncontrolled Continue diet and exercise. Check A1C - Hemoglobin A1c - Insulin, fasting - Taught pt how to use glucose meter.  Please check glucose level once a day.  Make sure you write down Date and Time and Glucose level in glucose log book.  glucose blood (FREESTYLE LITE) test strip; Use as instructed  Dispense: 100 each; Refill: 12  8.Dyshidrotic Eczema- Feet Dyshidrotic eczema, or dyshidrosis, is a skin condition in which blisters develop on the soles of your feet and/or the palms of your hands. The blisters are usually itchy and may be filled with fluid. Blisters normally last for about two to four weeks and may be related to seasonal allergies or stress. -The exact cause is unknown. -If you have dyshidrotic  eczema, you'll notice blisters forming on your fingers, toes, hands, and/or feet. The blisters may be more common on the edges of these areas and will probably be full of fluid. Sometimes, large blisters will form, which can be particularly painful. The blisters will usually be very itchy and may cause your skin to flake. Some people report that the affected areas become cracked and are painful to the touch. -The blisters may last up to three weeks before they begin to dry. As the blisters dry up, they'll turn into skin cracks that may be painful. If you have been scratching the affected areas, you may also notice that your skin seems thicker or feels spongy. Suggest using the following twice a day to relieve itching: -petrolem jelly, such as Vaseline -heavy creams, such as Lubriderm or Eucerin -mineral oil -steroid or corticosteroid ointments (hydrocortisone OTC)  -To avoid making your pain and itching worse, try not to scratch or break your blisters. Although it's important to wash your hands regularly, you may want to avoid extensive contact with water, such as frequent hand washing. You should also avoid using products that can irritate your skin, such as perfumed lotions and dishwashing soap. -Dyshidrotic eczema will usually disappear in a few weeks without complications. If you don't scratch the affected skin, it may not leave any noticeable marks or scars. If you scratch the affected area, you may experience more discomfort, or your outbreak may take longer to heal. You could also develop a bacterial infection as a result of scratching and breaking your blisters. -Although your outbreak  of dyshidrotic eczema may heal completely, it can also recur. Because the cause of dyshidrotic eczema isn't known, doctors have yet to find ways to prevent or cure the condition.   Continue diet and meds as discussed. Further disposition pending results of labs. Discussed med's effects and SE's.  Please follow  up in 3 months with Dr. Melford Aase.  HPI 63 y.o. female  presents for 3 month follow up with hypertension, hyperlipidemia, diabetes, GERD, asthma, Hypokalemia and vitamin D.  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  Patient is currently taking HCTZ 25mg  daily.  She denies chest pain, shortness of breath, dizziness.   She is on cholesterol medication (red yeast rice extract 1200mg  daily) and denies myalgias. Her cholesterol is not at goal. The cholesterol last visit was:   Lab Results  Component Value Date   CHOL 198 07/02/2014   HDL 78 07/02/2014   LDLCALC 105* 07/02/2014   TRIG 77 07/02/2014   CHOLHDL 2.5 07/02/2014   She has been working on diet and exercise for Diabetes, states she does have polyuria, polydipsia and increase appetite and denies foot ulcerations, nausea and visual disturbances.  She states she has urinary incontinence and saw urologist and he said it was bladder spasms.  He put her on a medication, but she does not remember the name.  That medication gave her "weird feeling" so she stopped taking it.  She states she has increased appetite due to recent weight loss and change in diet and she feels hungry.   Last A1C in the office was:  Lab Results  Component Value Date   HGBA1C 6.6* 07/02/2014   She has had diabetes for 3 months.  Currently manages diabetes with diet and exercise    Patient was not provided glucose meter at last visit. Number of meals per day- 3  B- small pack of peanut butter, slice of bacon.  L- homemade veggie soup, ham sandwich with lettuce, tomato and pickles.  D- veggies, meat (pork chop and chicken and sometimes hot dogs)  Snacks-  Chips and sometimes popcorn  Beverages- Flavor ICE water.  Diet soda.    Regular physical activity? 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  DIET PILL- She states she was taking SlimQuick powder, but now switching over to once a day pill.   Up to date Eye Exam?  One month ago and was normal.   Podiatry Exam?  Saw for toenail removal in past, but has not been seen in years.  Does not want to go to podiatrist right now. Does pt check their feet every day?  Was not told to.  States she does have small blisters on feet that get hard and then peel off.  She does get tingling in feet sometimes.  Patient is on Vitamin D supplement OTC 5,000 IU daily.  Lab Results  Component Value Date   VD25OH 38 07/02/2014     GERD- Patient needs refill of Aciphex.  Denies abdominal pain, nausea, diarrhea and constipation. ASTHMA- Patient was told (by Dr. Mosetta Anis- allergist) to stop Symbicort and start taking Advair.  Patient states she has enough Advair right now, but was wondering about switching to another inhaler due to high cost of Advair.  Told pt I would check to see  what coupons we have.  Goes to allergist to also get allergy shots and is taking allegra.  Pt denies SOB, wheezing, cough and chest pain.    Current Medications:  Current Outpatient Prescriptions on File Prior to Visit  Medication Sig Dispense Refill  . calcium carbonate (CALCIUM 600) 600 MG TABS tablet Take 600 mg by mouth daily.    . cholecalciferol (VITAMIN D) 1000 UNITS tablet Take 1,000 Units by mouth daily.    . cyanocobalamin 100 MCG tablet Take 100 mcg by mouth daily.    Marland Kitchen estrogens conjugated, synthetic B, (ENJUVIA) 0.45 MG tablet Take 0.45 mg by mouth daily.    . fexofenadine (ALLEGRA) 180 MG tablet Take 180 mg by mouth daily as needed.     . hydrochlorothiazide (HYDRODIURIL) 25 MG tablet Take 25 mg by mouth daily.    Marland Kitchen MAGNESIUM PO Take 250 mg by mouth daily.     . Multiple Vitamins-Minerals (HAIR/SKIN/NAILS PO) Take by mouth daily.    . Multiple Vitamins-Minerals (MULTIVITAMIN WITH MINERALS) tablet Take 1 tablet by mouth daily.     No current  facility-administered medications on file prior to visit.   Medical History:  Past Medical History  Diagnosis Date  . Pneumonia 5/13  . Multiple allergies   . GERD (gastroesophageal reflux disease)   . Arthritis   . Bilateral carpal tunnel syndrome   . Wears glasses   . Wears dentures     upper  . Positive PPD   . Right rotator cuff tear 01/20/2013  . Hypertension   . Hyperlipidemia   . Unspecified vitamin D deficiency   . Asthma   . Anemia    Allergies:  Allergies  Allergen Reactions  . Metronidazole Other (See Comments)    unknown  . Ppd [Tuberculin Purified Protein Derivative] Other (See Comments)    + PPD 2013 with NEG CXR 05/2012     Review of Systems: [X]  = complains of  [ ]  = denies  General: Fatigue [ ]  Fever [ ]  Chills [ ]  Weakness [ ]   Insomnia [ ]  Eyes: Redness [ ]  Blurred vision [ ]  Diplopia [ ]   ENT: Congestion [ ]  Sinus Pain [ ]  Post Nasal Drip [ ]  Sore Throat [ ]  Earache [ ]   Cardiac: Chest pain/pressure [ ]  SOB [ ]  Orthopnea [ ]   Palpitations [ ]   Paroxysmal nocturnal dyspnea[ ]  Claudication [ ]  Edema [ ]   Pulmonary: Cough [ ]  Wheezing[ ]   SOB [ ]   Snoring [ ]   GI: Nausea [ ]  Vomiting[ ]  Dysphagia[ ]  Heartburn[ ]  Abdominal pain [ ]  Constipation [ ] ; Diarrhea [ ] ; BRBPR [ ]  Melena[ ]  GU: Hematuria[ ]  Dysuria [ ]  Nocturia[ ]  Urgency [ ]   Hesitancy [ ]  Discharge [ ]  Neuro: Headaches[ ]  Vertigo[ ]  Paresthesias[ ]  Spasm [ ]  Speech changes [ ]  Incoordination [ ]  Tingling in feet Valu.Nieves ] Ortho: Arthritis [ ]  Joint pain [ ]  Muscle pain [ ]  Joint swelling [ ]  Back Pain [ ]  Skin:  Rash [ ]   Pruritis [ ]  Change in skin lesion [ ]   Psych: Depression[ ]  Anxiety[ ]  Confusion [ ]  Memory loss [ ]   Heme/Lypmh: Bleeding [ ]  Bruising [ ]  Enlarged lymph nodes [ ]   Endocrine: Visual blurring [ ]  Paresthesia [ ]  Polyuria [ X] Polydipsia Valu.Nieves ]   Heat/cold intolerance [ ]  Hypoglycemia [ ]   Family history- Review and unchanged Social history- Review and unchanged Physical Exam: BP  118/60 mmHg  Pulse  64  Temp(Src) 98.2 F (36.8 C) (Temporal)  Resp 18  Wt 195 lb (88.451 kg)  SpO2 99% Wt Readings from Last 3 Encounters:  10/10/14 195 lb (88.451 kg)  07/02/14 204 lb 12.8 oz (92.897 kg)  05/31/14 198 lb 2 oz (89.869 kg)  Vitals Reviewed. General Appearance: Well nourished, in no apparent distress. Eyes: PERRLA, EOMs, conjunctiva no swelling or erythema Sinuses: No Frontal/maxillary tenderness ENT/Mouth: Ext aud canals clear, TMs without erythema, bulging. No erythema, swelling, or exudate on post pharynx.  Tonsils not swollen or erythematous. Hearing normal.  Neck: Supple, thyroid normal.  Respiratory: Respiratory effort normal, BS equal bilaterally without rales, rhonchi, wheezing or stridor.  Cardio: RRR with no MRGs. Brisk peripheral pulses without edema.  Abdomen: Soft, + BS.  Non tender, no guarding, rebound, hernias, masses. Lymphatics: Non tender without lymphadenopathy.  Musculoskeletal: Full ROM, 5/5 strength, normal gait.  Skin: Warm, dry without rashes, lesions, ecchymosis.  Neuro: Cranial nerves intact. No cerebellar symptoms. Sensation intact.  Psych: Awake and oriented X 3, normal affect, Insight and Judgment appropriate.   Diabetic Foot Exam- normal DP and PT pulses and normal sensory exam.  Both feet have small pruritic blisters- clear fluid (some that have callus/hardened and some that have peeled) on plantar medial surface bilaterally.    Quianna Avery, Stephani Police, PA-C 8:53 PM [facility]

## 2014-10-11 LAB — CBC WITH DIFFERENTIAL/PLATELET
BASOS ABS: 0 10*3/uL (ref 0.0–0.1)
Basophils Relative: 0 % (ref 0–1)
EOS ABS: 0.1 10*3/uL (ref 0.0–0.7)
Eosinophils Relative: 2 % (ref 0–5)
HCT: 39.6 % (ref 36.0–46.0)
Hemoglobin: 12.4 g/dL (ref 12.0–15.0)
Lymphocytes Relative: 61 % — ABNORMAL HIGH (ref 12–46)
Lymphs Abs: 4.5 10*3/uL — ABNORMAL HIGH (ref 0.7–4.0)
MCH: 25.9 pg — AB (ref 26.0–34.0)
MCHC: 31.3 g/dL (ref 30.0–36.0)
MCV: 82.8 fL (ref 78.0–100.0)
MONO ABS: 0.4 10*3/uL (ref 0.1–1.0)
MONOS PCT: 6 % (ref 3–12)
NEUTROS PCT: 31 % — AB (ref 43–77)
Neutro Abs: 2.3 10*3/uL (ref 1.7–7.7)
Platelets: 316 10*3/uL (ref 150–400)
RBC: 4.78 MIL/uL (ref 3.87–5.11)
RDW: 14.9 % (ref 11.5–15.5)
WBC: 7.4 10*3/uL (ref 4.0–10.5)

## 2014-10-11 LAB — BASIC METABOLIC PANEL WITH GFR
BUN: 12 mg/dL (ref 6–23)
CALCIUM: 9.5 mg/dL (ref 8.4–10.5)
CO2: 27 mEq/L (ref 19–32)
Chloride: 104 mEq/L (ref 96–112)
Creat: 0.97 mg/dL (ref 0.50–1.10)
GFR, EST AFRICAN AMERICAN: 72 mL/min
GFR, EST NON AFRICAN AMERICAN: 63 mL/min
Glucose, Bld: 90 mg/dL (ref 70–99)
POTASSIUM: 4 meq/L (ref 3.5–5.3)
SODIUM: 139 meq/L (ref 135–145)

## 2014-10-11 LAB — LIPID PANEL
Cholesterol: 182 mg/dL (ref 0–200)
HDL: 77 mg/dL (ref >39)
LDL Cholesterol: 85 mg/dL (ref 0–99)
TRIGLYCERIDES: 102 mg/dL (ref <150)
Total CHOL/HDL Ratio: 2.4 Ratio
VLDL: 20 mg/dL (ref 0–40)

## 2014-10-11 LAB — HEPATIC FUNCTION PANEL
ALT: 19 U/L (ref 0–35)
AST: 24 U/L (ref 0–37)
Albumin: 3.8 g/dL (ref 3.5–5.2)
Alkaline Phosphatase: 83 U/L (ref 39–117)
BILIRUBIN INDIRECT: 0.3 mg/dL (ref 0.2–1.2)
Bilirubin, Direct: 0.1 mg/dL (ref 0.0–0.3)
Total Bilirubin: 0.4 mg/dL (ref 0.2–1.2)
Total Protein: 6.7 g/dL (ref 6.0–8.3)

## 2014-10-11 LAB — INSULIN, FASTING: Insulin fasting, serum: 15 u[IU]/mL (ref 2.0–19.6)

## 2014-10-11 LAB — TSH: TSH: 2.265 u[IU]/mL (ref 0.350–4.500)

## 2014-10-11 LAB — HEMOGLOBIN A1C
Hgb A1c MFr Bld: 6.2 % — ABNORMAL HIGH (ref <5.7)
MEAN PLASMA GLUCOSE: 131 mg/dL — AB (ref <117)

## 2014-10-12 LAB — VITAMIN D 25 HYDROXY (VIT D DEFICIENCY, FRACTURES): VIT D 25 HYDROXY: 51 ng/mL (ref 30–89)

## 2014-10-20 ENCOUNTER — Encounter: Payer: Self-pay | Admitting: *Deleted

## 2014-10-31 ENCOUNTER — Encounter: Payer: Self-pay | Admitting: Physician Assistant

## 2014-10-31 ENCOUNTER — Ambulatory Visit (INDEPENDENT_AMBULATORY_CARE_PROVIDER_SITE_OTHER): Payer: BC Managed Care – PPO | Admitting: Physician Assistant

## 2014-10-31 VITALS — BP 122/60 | HR 80 | Temp 98.0°F | Resp 18 | Ht 62.5 in | Wt 191.0 lb

## 2014-10-31 DIAGNOSIS — J029 Acute pharyngitis, unspecified: Secondary | ICD-10-CM

## 2014-10-31 MED ORDER — PREDNISONE 20 MG PO TABS: ORAL_TABLET | ORAL | Status: AC

## 2014-10-31 MED ORDER — AZITHROMYCIN 250 MG PO TABS: ORAL_TABLET | ORAL | Status: AC

## 2014-10-31 MED ORDER — PROMETHAZINE-DM 6.25-15 MG/5ML PO SYRP: 5.0000 mL | ORAL_SOLUTION | Freq: Four times a day (QID) | ORAL | Status: AC | PRN

## 2014-10-31 NOTE — Progress Notes (Signed)
Subjective:    Patient ID: Lauren Lopez, female    DOB: [birth year], 63 y.o.   MRN: [id number]  Cough This is a new problem. Episode onset: 5 days ago. The problem has been gradually worsening. Cough characteristics: Productive with green sputum  Associated symptoms include chills, ear pain, a fever, rhinorrhea, a sore throat, shortness of breath and wheezing. Pertinent negatives include no headaches or rash. Risk factors: No sick contacts.   Patient states she was also painting her front door and thinks it could have been from paint  (but the paint did not have a smell). Treatments tried: Mucinex, Tylenol sinus, Benadryl. The treatment provided no relief. Her past medical history is significant for environmental allergies.  GFR= 72 on 10/10/14 Review of Systems  Constitutional: Positive for fever, chills, diaphoresis and fatigue.  HENT: Positive for congestion, ear pain, rhinorrhea, sinus pressure, sneezing, sore throat and voice change. Negative for trouble swallowing.   Eyes: Negative.   Respiratory: Positive for cough, chest tightness, shortness of breath and wheezing.   Cardiovascular: Negative.   Gastrointestinal: Negative.   Genitourinary: Negative.   Skin: Negative.  Negative for rash.  Allergic/Immunologic: Positive for environmental allergies.  Neurological: Positive for light-headedness. Negative for dizziness and headaches.   Past Medical History  Diagnosis Date  . Pneumonia 5/13  . Multiple allergies   . GERD (gastroesophageal reflux disease)   . Arthritis   . Bilateral carpal tunnel syndrome   . Wears glasses   . Wears dentures     upper  . Positive PPD   . Right rotator cuff tear 01/20/2013  . Hypertension   . Hyperlipidemia   . Unspecified vitamin D deficiency   . Asthma   . Anemia    Current Outpatient Prescriptions on File Prior to Visit  Medication Sig Dispense Refill  . ADVAIR HFA 115-21 MCG/ACT inhaler   6  . calcium carbonate (CALCIUM 600) 600 MG TABS  tablet Take 600 mg by mouth daily.    . cholecalciferol (VITAMIN D) 1000 UNITS tablet Take 1,000 Units by mouth daily.    . cyanocobalamin 100 MCG tablet Take 100 mcg by mouth daily.    Marland Kitchen estrogens conjugated, synthetic B, (ENJUVIA) 0.45 MG tablet Take 0.45 mg by mouth daily.    . fexofenadine (ALLEGRA) 180 MG tablet Take 180 mg by mouth daily as needed.     Marland Kitchen glucose blood (FREESTYLE LITE) test strip Use as instructed 100 each 12  . hydrochlorothiazide (HYDRODIURIL) 25 MG tablet Take 25 mg by mouth daily.    Marland Kitchen MAGNESIUM PO Take 250 mg by mouth daily.     . Multiple Vitamins-Minerals (HAIR/SKIN/NAILS PO) Take by mouth daily.    . Multiple Vitamins-Minerals (MULTIVITAMIN WITH MINERALS) tablet Take 1 tablet by mouth daily.    . RABEprazole (ACIPHEX) 20 MG tablet Take 1 tablet (20 mg total) by mouth daily. 30 tablet 2  . Red Yeast Rice Extract (RED YEAST RICE PO) Take 2,000 mg by mouth daily.     No current facility-administered medications on file prior to visit.   Allergies  Allergen Reactions  . Metronidazole Other (See Comments)    unknown  . Ppd [Tuberculin Purified Protein Derivative] Other (See Comments)    + PPD 2013 with NEG CXR 05/2012     BP 122/60 mmHg  Pulse 80  Temp(Src) 98 F (36.7 C) (Temporal)  Resp 18  Ht 5' 2.5" (1.588 m)  Wt 191 lb (86.637 kg)  BMI 34.36 kg/m2  SpO2 99%  Wt Readings from Last 3 Encounters:  10/31/14 191 lb (86.637 kg)  10/10/14 195 lb (88.451 kg)  07/02/14 204 lb 12.8 oz (92.897 kg)   Objective:   Physical Exam  Constitutional: She is oriented to person, place, and time. She appears well-developed and well-nourished. She has a sickly appearance. No distress.  HENT:  Head: Normocephalic.  Right Ear: External ear and ear canal normal. Tympanic membrane is injected. Tympanic membrane is not erythematous, not retracted and not bulging.  Left Ear: External ear and ear canal normal. Tympanic membrane is injected. Tympanic membrane is not  erythematous, not retracted and not bulging.  Nose: Nose normal. Right sinus exhibits no maxillary sinus tenderness and no frontal sinus tenderness. Left sinus exhibits no maxillary sinus tenderness and no frontal sinus tenderness.  Mouth/Throat: Uvula is midline and mucous membranes are normal. Mucous membranes are not pale and not dry. No trismus in the jaw. No uvula swelling. Posterior oropharyngeal edema and posterior oropharyngeal erythema present. No oropharyngeal exudate or tonsillar abscesses.  Eyes: Conjunctivae and lids are normal. Pupils are equal, round, and reactive to light. Right eye exhibits no discharge. Left eye exhibits no discharge. No scleral icterus.  Neck: Trachea normal and normal range of motion. Neck supple. No tracheal deviation present.  Raspy voice  Cardiovascular: Normal rate, regular rhythm, S1 normal, S2 normal, normal heart sounds and normal pulses.  Exam reveals no gallop, no distant heart sounds and no friction rub.   No murmur heard. Pulmonary/Chest: Effort normal and breath sounds normal. No stridor. No respiratory distress. She has no decreased breath sounds. She has no wheezes. She has no rhonchi. She has no rales. She exhibits no tenderness.  Abdominal: Soft. Bowel sounds are normal.  Lymphadenopathy:  No tenderness or LAD.  Neurological: She is alert and oriented to person, place, and time. Gait normal.  Skin: Skin is warm, dry and intact. No rash noted. She is not diaphoretic.  Psychiatric: She has a normal mood and affect. Her speech is normal and behavior is normal. Judgment and thought content normal. Cognition and memory are normal.  Vitals reviewed.    Assessment & Plan:  1. Acute pharyngitis, unspecified pharyngitis type -Take Z-Pak as prescribed- azithromycin (ZITHROMAX) 250 MG tablet; Take 2 tablets PO on day 1, then 1 tablet PO Q24H x 4 days  Dispense: 6 tablet; Refill: 0 - Take Prednisone as prescribed for inflammation- predniSONE (DELTASONE)  20 MG tablet; Take 3 tablets PO QDaily for 3 days, then take 2 tablets PO QDaily for 3 days, then take 1 tablet PO QDaily for 3 days  Dispense: 18 tablet; Refill: 0 - Take Promethazine-DM as prescribed for cough- promethazine-dextromethorphan (PROMETHAZINE-DM) 6.25-15 MG/5ML syrup; Take 5 mLs by mouth 4 (four) times daily as needed for cough.  Dispense: 180 mL; Refill: 0 -Take Mucinex (without DM) 12 Hour twice a day. Continue Allegra OTC and Flonase.  Discussed medication effects and SE's.  Pt agreed to treatment plan. If you are not feeling better in 10-14 days, then please call the office. Please keep your follow up appt on 01/29/15.  Leanor Voris, Stephani Police, PA-C 2:09 PM [facility]

## 2014-10-31 NOTE — Patient Instructions (Signed)
-  Take Z-Pak as prescribed. -Take Promethazine-DM as prescribed for cough.  Can make your sleepy. -Take the Prednisone as prescribed.  Watch proportion sizes. -Make sure you are drinking plenty of water to stay hydrated. -Continue to take Mucinex (without DM) Take allegra OTC daily and Flonase as prescribed. -While drinking fluids, pinch and hold nose close and swallow, to help open eustachian tubes to drain fluid behind ear drums.  If you are not feeling better in 10-14 days, then please call the office   Pharyngitis Pharyngitis is redness, pain, and swelling (inflammation) of your pharynx.  CAUSES  Pharyngitis is usually caused by infection. Most of the time, these infections are from viruses (viral) and are part of a cold. However, sometimes pharyngitis is caused by bacteria (bacterial). Pharyngitis can also be caused by allergies. Viral pharyngitis may be spread from person to person by coughing, sneezing, and personal items or utensils (cups, forks, spoons, toothbrushes). Bacterial pharyngitis may be spread from person to person by more intimate contact, such as kissing.  SIGNS AND SYMPTOMS  Symptoms of pharyngitis include:   Sore throat.   Tiredness (fatigue).   Low-grade fever.   Headache.  Joint pain and muscle aches.  Skin rashes.  Swollen lymph nodes.  Plaque-like film on throat or tonsils (often seen with bacterial pharyngitis). DIAGNOSIS  Your health care provider will ask you questions about your illness and your symptoms. Your medical history, along with a physical exam, is often all that is needed to diagnose pharyngitis. Sometimes, a rapid strep test is done. Other lab tests may also be done, depending on the suspected cause.  TREATMENT  Viral pharyngitis will usually get better in 3-4 days without the use of medicine. Bacterial pharyngitis is treated with medicines that kill germs (antibiotics).  HOME CARE INSTRUCTIONS   Drink enough water and fluids to keep  your urine clear or pale yellow.   Only take over-the-counter or prescription medicines as directed by your health care provider:   If you are prescribed antibiotics, make sure you finish them even if you start to feel better.   Do not take aspirin.   Get lots of rest.   Gargle with 8 oz of salt water ( tsp of salt per 1 qt of water) as often as every 1-2 hours to soothe your throat.   Throat lozenges (if you are not at risk for choking) or sprays may be used to soothe your throat. SEEK MEDICAL CARE IF:   You have large, tender lumps in your neck.  You have a rash.  You cough up green, yellow-brown, or bloody spit. SEEK IMMEDIATE MEDICAL CARE IF:   Your neck becomes stiff.  You drool or are unable to swallow liquids.  You vomit or are unable to keep medicines or liquids down.  You have severe pain that does not go away with the use of recommended medicines.  You have trouble breathing (not caused by a stuffy nose). MAKE SURE YOU:   Understand these instructions.  Will watch your condition.  Will get help right away if you are not doing well or get worse. Document Released: 11/16/2005 Document Revised: 09/06/2013 Document Reviewed: 07/24/2013 [facility] Patient Information 2015 [place], [place]. This information is not intended to replace advice given to you by your health care provider. Make sure you discuss any questions you have with your health care provider.

## 2014-12-01 ENCOUNTER — Ambulatory Visit (INDEPENDENT_AMBULATORY_CARE_PROVIDER_SITE_OTHER): Payer: BLUE CROSS/BLUE SHIELD | Admitting: Family Medicine

## 2014-12-01 VITALS — BP 118/70 | HR 62 | Temp 97.7°F | Resp 18 | Ht 64.0 in | Wt 187.0 lb

## 2014-12-01 DIAGNOSIS — R197 Diarrhea, unspecified: Secondary | ICD-10-CM

## 2014-12-01 DIAGNOSIS — E119 Type 2 diabetes mellitus without complications: Secondary | ICD-10-CM

## 2014-12-01 DIAGNOSIS — B349 Viral infection, unspecified: Secondary | ICD-10-CM

## 2014-12-01 LAB — GLUCOSE, POCT (MANUAL RESULT ENTRY): POC Glucose: 118 mg/dl — AB (ref 70–99)

## 2014-12-01 NOTE — Progress Notes (Signed)
Subjective:    Patient ID: Lauren Lopez, female    DOB: 13-Sep-1951, 64 y.o.   MRN: [id number] This chart was scribed for Merri Ray, MD by Marti Sleigh, Medical Scribe. This patient was seen in Room 12 and the patient's care was started a 1:26 PM.  HPI HPI Comments: Lauren Lopez is a 64 y.o. female with a hx of HTN, asthma, GERD, HLD and hypokalemia who presents to [facility] complaining of diarrhea (two events) that started three days ago in the night and continued all day with three more events. Pt endorses associated HA, ear congestion, cough, rhinorrhea, and sick contacts. Pt denies fever, chills, nausea, or vomiting. Pt states she took pepto bismol two days ago with relief of her diarrhea, but states she lost her appetite when she started pepto bismol. Pt states her appetite has since returned. Pt endorses resolved abdominal pain yesterday.  Pt states that she is not sure how to use her blood sugar machine. She would like to check her sugar at home, but has not been able to thus far.   Pt's PCP is Dr. Terrence Dupont. Status post annual exam in august of last year, and routine follow up by Jani Gravel on November 11th with chronic issues addressed at that time, including DM type 2, A1c of 6.2 on November 11th.    Patient Active Problem List   Diagnosis Date Noted  . Diabetes type 2, uncontrolled 10/10/2014  . Dyshidrotic eczema 10/10/2014  . GERD (gastroesophageal reflux disease)   . Hyperlipidemia   . Vitamin D deficiency   . Asthma   . Anemia   . Right rotator cuff tear 01/20/2013  . HYPOPOTASSEMIA 08/22/2009  . Essential hypertension 08/09/2009  . CHEST PAIN UNSPECIFIED 07/24/2009   Past Medical History  Diagnosis Date  . Pneumonia 5/13  . Multiple allergies   . GERD (gastroesophageal reflux disease)   . Arthritis   . Bilateral carpal tunnel syndrome   . Wears glasses   . Wears dentures     upper  . Positive PPD   . Right rotator cuff tear 01/20/2013  .  Hypertension   . Hyperlipidemia   . Unspecified vitamin D deficiency   . Asthma   . Anemia    Past Surgical History  Procedure Laterality Date  . Bilateral salpingoophorectomy  11/28/2010    open laparoscopy with adhesiolysis also  . Knee arthroscopy  2005    rt  . Colonoscopy    . Carpal tunnel release  07/15/2012    Procedure: CARPAL TUNNEL RELEASE;  Surgeon: Cammie Sickle., MD;  Location: [place];  Service: Orthopedics;  Laterality: Left;  . Shoulder arthroscopy with rotator cuff repair and subacromial decompression Right 01/20/2013    Procedure: RIGHT ARTHROSCOPY SHOULDER DEBRIDEMENT LIMITED, ARTHROSCOPY SHOULDER DECOMPRESSION SUBACROMIAL PARTIAL ACROMIOPLASTY WITH CORACOACROMIAL RELEASE, ROTATOR CUFF REPAIR ;  Surgeon: Johnny Bridge, MD;  Location: [place];  Service: Orthopedics;  Laterality: Right;  RIGHT SHOULDER SCOPE DEBRIDEMENT, ACRIOMIOPLASTY, ROTATOR CUFF REPAIR  . Abdominal hysterectomy    . Carpal tunnel release Right 05/31/2014    Procedure: RIGHT CARPAL TUNNEL RELEASE;  Surgeon: Cammie Sickle, MD;  Location: [place];  Service: Orthopedics;  Laterality: Right;   Allergies  Allergen Reactions  . Metronidazole Other (See Comments)    unknown  . Ppd [Tuberculin Purified Protein Derivative] Other (See Comments)    + PPD 2013 with NEG CXR 05/2012   Prior to Admission medications  Medication Sig Start Date End Date Taking? Authorizing Provider  ADVAIR [facility] 093-23 MCG/ACT inhaler  09/20/14  Yes Historical Provider, MD  calcium carbonate (CALCIUM 600) 600 MG TABS tablet Take 600 mg by mouth daily.   Yes Historical Provider, MD  cholecalciferol (VITAMIN D) 1000 UNITS tablet Take 1,000 Units by mouth daily.   Yes Historical Provider, MD  cyanocobalamin 100 MCG tablet Take 100 mcg by mouth daily.   Yes Historical Provider, MD  estrogens conjugated, synthetic B, (ENJUVIA) 0.45 MG tablet Take 0.45 mg by mouth daily.    Yes Historical Provider, MD  fexofenadine (ALLEGRA) 180 MG tablet Take 180 mg by mouth daily as needed.    Yes Historical Provider, MD  glucose blood (FREESTYLE LITE) test strip Use as instructed 10/10/14 10/11/15 Yes Jennifer L Couillard, PA-C  hydrochlorothiazide (HYDRODIURIL) 25 MG tablet Take 25 mg by mouth daily.   Yes Historical Provider, MD  MAGNESIUM PO Take 250 mg by mouth daily.    Yes Historical Provider, MD  RABEprazole (ACIPHEX) 20 MG tablet Take 1 tablet (20 mg total) by mouth daily. 10/10/14  Yes Jennifer L Couillard, PA-C  Red Yeast Rice Extract (RED YEAST RICE PO) Take 2,000 mg by mouth daily.   Yes Historical Provider, MD   History   Social History  . Marital Status: Married    Spouse Name: N/A    Number of Children: N/A  . Years of Education: N/A   Occupational History  . Not on file.   Social History Main Topics  . Smoking status: Never Smoker   . Smokeless tobacco: Not on file  . Alcohol Use: No  . Drug Use: No  . Sexual Activity: Not on file   Other Topics Concern  . Not on file   Social History Narrative     Review of Systems  Constitutional: Negative for fever and chills.  HENT: Positive for congestion, ear pain and rhinorrhea.   Respiratory: Positive for cough.   Gastrointestinal: Positive for diarrhea. Negative for nausea and vomiting.  Genitourinary: Positive for hematuria.  Neurological: Positive for headaches.       Objective:   Physical Exam  Constitutional: She is oriented to person, place, and time. She appears well-developed and well-nourished. No distress.  HENT:  Head: Normocephalic and atraumatic.  Right Ear: Hearing, tympanic membrane, external ear and ear canal normal.  Left Ear: Hearing, tympanic membrane, external ear and ear canal normal.  Nose: Nose normal.  Mouth/Throat: Oropharynx is clear and moist. No oropharyngeal exudate or posterior oropharyngeal erythema.  Sinuses non-tender.  Eyes: Conjunctivae and EOM are normal.  Pupils are equal, round, and reactive to light.  Neck: No JVD present.  Cardiovascular: Normal rate, regular rhythm, normal heart sounds and intact distal pulses.   No murmur heard. Pulmonary/Chest: Effort normal and breath sounds normal. No respiratory distress. She has no wheezes. She has no rhonchi.  Abdominal: Soft.  Hyperactive bowel sounds.  Neurological: She is alert and oriented to person, place, and time.  Skin: Skin is warm and dry. No rash noted.  Psychiatric: She has a normal mood and affect. Her behavior is normal.  Nursing note and vitals reviewed.   Filed Vitals:   12/01/14 1310  BP: 118/70  Pulse: 62  Temp: 97.7 F (36.5 C)  TempSrc: Oral  Resp: 18  Height: 5\' 4"  (1.626 m)  Weight: 187 lb (84.823 kg)  SpO2: 100%   Results for orders placed or performed in visit on 12/01/14  POCT glucose (manual  entry)  Result Value Ref Range   POC Glucose 118 (A) 70 - 99 mg/dl        Assessment & Plan:  Lauren Lopez is a 64 y.o. female Type 2 diabetes mellitus without complication - Plan: POCT glucose (manual entry)  Viral illness  Diarrhea  Suspected viral illness with improving/resolving diarrhea, now with head congestion and otalgia without apparent otitis.   -as diarrhea and abd pain improving - continued symptomatic care discussed. Saline NS for nasal congestion, tylenol for arthralgias, and RTC if any worsening.    I was unable to get her meter working with attempt at blood draw in office. Can return this to PCP or new meter if needed but ok to hold on checking for now with her level of control until new meter obtained.   No orders of the defined types were placed in this encounter.   Patient Instructions  Your symptoms appear to be due to a virus. As the diarrhea is improving - no labs or prescription meds needed at this time.  If the diarrhea worsens or abdominal pains return - recheck here, primary care provider, or in emergency room.  Look at the  manual for your blood sugar meter. If still unable to obtain reading - can ask your primary provider for a replacement or purchase new meter if needed.   Viral Infections A viral infection can be caused by different types of viruses.Most viral infections are not serious and resolve on their own. However, some infections may cause severe symptoms and may lead to further complications. SYMPTOMS Viruses can frequently cause:  Minor sore throat.  Aches and pains.  Headaches.  Runny nose.  Different types of rashes.  Watery eyes.  Tiredness.  Cough.  Loss of appetite.  Gastrointestinal infections, resulting in nausea, vomiting, and diarrhea. These symptoms do not respond to antibiotics because the infection is not caused by bacteria. However, you might catch a bacterial infection following the viral infection. This is sometimes called a "superinfection." Symptoms of such a bacterial infection may include:  Worsening sore throat with pus and difficulty swallowing.  Swollen neck glands.  Chills and a high or persistent fever.  Severe headache.  Tenderness over the sinuses.  Persistent overall ill feeling (malaise), muscle aches, and tiredness (fatigue).  Persistent cough.  Yellow, green, or brown mucus production with coughing. HOME CARE INSTRUCTIONS   Only take over-the-counter or prescription medicines for pain, discomfort, diarrhea, or fever as directed by your caregiver.  Drink enough water and fluids to keep your urine clear or pale yellow. Sports drinks can provide valuable electrolytes, sugars, and hydration.  Get plenty of rest and maintain proper nutrition. Soups and broths with crackers or rice are fine. SEEK IMMEDIATE MEDICAL CARE IF:   You have severe headaches, shortness of breath, chest pain, neck pain, or an unusual rash.  You have uncontrolled vomiting, diarrhea, or you are unable to keep down fluids.  You or your child has an oral temperature above  102 F (38.9 C), not controlled by medicine.  Your baby is older than 3 months with a rectal temperature of 102 F (38.9 C) or higher.  Your baby is 48 months old or younger with a rectal temperature of 100.4 F (38 C) or higher. MAKE SURE YOU:   Understand these instructions.  Will watch your condition.  Will get help right away if you are not doing well or get worse. Document Released: 08/26/2005 Document Revised: 02/08/2012 Document Reviewed: 03/23/2011 [employer]  Patient Information 2015 [place]. This information is not intended to replace advice given to you by your health care provider. Make sure you discuss any questions you have with your health care provider.  Diarrhea Diarrhea is frequent loose and watery bowel movements. It can cause you to feel weak and dehydrated. Dehydration can cause you to become tired and thirsty, have a dry mouth, and have decreased urination that often is dark yellow. Diarrhea is a sign of another problem, most often an infection that will not last long. In most cases, diarrhea typically lasts 2-3 days. However, it can last longer if it is a sign of something more serious. It is important to treat your diarrhea as directed by your caregiver to lessen or prevent future episodes of diarrhea. CAUSES  Some common causes include:  Gastrointestinal infections caused by viruses, bacteria, or parasites.  Food poisoning or food allergies.  Certain medicines, such as antibiotics, chemotherapy, and laxatives.  Artificial sweeteners and fructose.  Digestive disorders. HOME CARE INSTRUCTIONS  Ensure adequate fluid intake (hydration): Have 1 cup (8 oz) of fluid for each diarrhea episode. Avoid fluids that contain simple sugars or sports drinks, fruit juices, whole milk products, and sodas. Your urine should be clear or pale yellow if you are drinking enough fluids. Hydrate with an oral rehydration solution that you can purchase at pharmacies, retail  stores, and online. You can prepare an oral rehydration solution at home by mixing the following ingredients together:   - tsp table salt.   tsp baking soda.   tsp salt substitute containing potassium chloride.  1  tablespoons sugar.  1 L (34 oz) of water.  Certain foods and beverages may increase the speed at which food moves through the gastrointestinal (GI) tract. These foods and beverages should be avoided and include:  Caffeinated and alcoholic beverages.  High-fiber foods, such as raw fruits and vegetables, nuts, seeds, and whole grain breads and cereals.  Foods and beverages sweetened with sugar alcohols, such as xylitol, sorbitol, and mannitol.  Some foods may be well tolerated and may help thicken stool including:  Starchy foods, such as rice, toast, pasta, low-sugar cereal, oatmeal, grits, baked potatoes, crackers, and bagels.  Bananas.  Applesauce.  Add probiotic-rich foods to help increase healthy bacteria in the GI tract, such as yogurt and fermented milk products.  Wash your hands well after each diarrhea episode.  Only take over-the-counter or prescription medicines as directed by your caregiver.  Take a warm bath to relieve any burning or pain from frequent diarrhea episodes. SEEK IMMEDIATE MEDICAL CARE IF:   You are unable to keep fluids down.  You have persistent vomiting.  You have blood in your stool, or your stools are black and tarry.  You do not urinate in 6-8 hours, or there is only a small amount of very dark urine.  You have abdominal pain that increases or localizes.  You have weakness, dizziness, confusion, or light-headedness.  You have a severe headache.  Your diarrhea gets worse or does not get better.  You have a fever or persistent symptoms for more than 2-3 days.  You have a fever and your symptoms suddenly get worse. MAKE SURE YOU:   Understand these instructions.  Will watch your condition.  Will get help right away if  you are not doing well or get worse. Document Released: 11/06/2002 Document Revised: 04/02/2014 Document Reviewed: 07/24/2012 [facility] Patient Information 2015 [place], [place]. This information is not intended to replace advice given to  you by your health care provider. Make sure you discuss any questions you have with your health care provider.     I personally performed the services described in this documentation, which was scribed in my presence. The recorded information has been reviewed and considered, and addended by me as needed.

## 2014-12-01 NOTE — Patient Instructions (Signed)
Your symptoms appear to be due to a virus. As the diarrhea is improving - no labs or prescription meds needed at this time.  If the diarrhea worsens or abdominal pains return - recheck here, primary care provider, or in emergency room.  Look at the manual for your blood sugar meter. If still unable to obtain reading - can ask your primary provider for a replacement or purchase new meter if needed.   Viral Infections A viral infection can be caused by different types of viruses.Most viral infections are not serious and resolve on their own. However, some infections may cause severe symptoms and may lead to further complications. SYMPTOMS Viruses can frequently cause:  Minor sore throat.  Aches and pains.  Headaches.  Runny nose.  Different types of rashes.  Watery eyes.  Tiredness.  Cough.  Loss of appetite.  Gastrointestinal infections, resulting in nausea, vomiting, and diarrhea. These symptoms do not respond to antibiotics because the infection is not caused by bacteria. However, you might catch a bacterial infection following the viral infection. This is sometimes called a "superinfection." Symptoms of such a bacterial infection may include:  Worsening sore throat with pus and difficulty swallowing.  Swollen neck glands.  Chills and a high or persistent fever.  Severe headache.  Tenderness over the sinuses.  Persistent overall ill feeling (malaise), muscle aches, and tiredness (fatigue).  Persistent cough.  Yellow, green, or brown mucus production with coughing. HOME CARE INSTRUCTIONS   Only take over-the-counter or prescription medicines for pain, discomfort, diarrhea, or fever as directed by your caregiver.  Drink enough water and fluids to keep your urine clear or pale yellow. Sports drinks can provide valuable electrolytes, sugars, and hydration.  Get plenty of rest and maintain proper nutrition. Soups and broths with crackers or rice are fine. SEEK  IMMEDIATE MEDICAL CARE IF:   You have severe headaches, shortness of breath, chest pain, neck pain, or an unusual rash.  You have uncontrolled vomiting, diarrhea, or you are unable to keep down fluids.  You or your child has an oral temperature above 102 F (38.9 C), not controlled by medicine.  Your baby is older than 3 months with a rectal temperature of 102 F (38.9 C) or higher.  Your baby is 70 months old or younger with a rectal temperature of 100.4 F (38 C) or higher. MAKE SURE YOU:   Understand these instructions.  Will watch your condition.  Will get help right away if you are not doing well or get worse. Document Released: 08/26/2005 Document Revised: 02/08/2012 Document Reviewed: 03/23/2011 [facility] Patient Information 2015 [place], [place]. This information is not intended to replace advice given to you by your health care provider. Make sure you discuss any questions you have with your health care provider.  Diarrhea Diarrhea is frequent loose and watery bowel movements. It can cause you to feel weak and dehydrated. Dehydration can cause you to become tired and thirsty, have a dry mouth, and have decreased urination that often is dark yellow. Diarrhea is a sign of another problem, most often an infection that will not last long. In most cases, diarrhea typically lasts 2-3 days. However, it can last longer if it is a sign of something more serious. It is important to treat your diarrhea as directed by your caregiver to lessen or prevent future episodes of diarrhea. CAUSES  Some common causes include:  Gastrointestinal infections caused by viruses, bacteria, or parasites.  Food poisoning or food allergies.  Certain medicines, such  as antibiotics, chemotherapy, and laxatives.  Artificial sweeteners and fructose.  Digestive disorders. HOME CARE INSTRUCTIONS  Ensure adequate fluid intake (hydration): Have 1 cup (8 oz) of fluid for each diarrhea episode. Avoid fluids  that contain simple sugars or sports drinks, fruit juices, whole milk products, and sodas. Your urine should be clear or pale yellow if you are drinking enough fluids. Hydrate with an oral rehydration solution that you can purchase at pharmacies, retail stores, and online. You can prepare an oral rehydration solution at home by mixing the following ingredients together:   - tsp table salt.   tsp baking soda.   tsp salt substitute containing potassium chloride.  1  tablespoons sugar.  1 L (34 oz) of water.  Certain foods and beverages may increase the speed at which food moves through the gastrointestinal (GI) tract. These foods and beverages should be avoided and include:  Caffeinated and alcoholic beverages.  High-fiber foods, such as raw fruits and vegetables, nuts, seeds, and whole grain breads and cereals.  Foods and beverages sweetened with sugar alcohols, such as xylitol, sorbitol, and mannitol.  Some foods may be well tolerated and may help thicken stool including:  Starchy foods, such as rice, toast, pasta, low-sugar cereal, oatmeal, grits, baked potatoes, crackers, and bagels.  Bananas.  Applesauce.  Add probiotic-rich foods to help increase healthy bacteria in the GI tract, such as yogurt and fermented milk products.  Wash your hands well after each diarrhea episode.  Only take over-the-counter or prescription medicines as directed by your caregiver.  Take a warm bath to relieve any burning or pain from frequent diarrhea episodes. SEEK IMMEDIATE MEDICAL CARE IF:   You are unable to keep fluids down.  You have persistent vomiting.  You have blood in your stool, or your stools are black and tarry.  You do not urinate in 6-8 hours, or there is only a small amount of very dark urine.  You have abdominal pain that increases or localizes.  You have weakness, dizziness, confusion, or light-headedness.  You have a severe headache.  Your diarrhea gets worse or  does not get better.  You have a fever or persistent symptoms for more than 2-3 days.  You have a fever and your symptoms suddenly get worse. MAKE SURE YOU:   Understand these instructions.  Will watch your condition.  Will get help right away if you are not doing well or get worse. Document Released: 11/06/2002 Document Revised: 04/02/2014 Document Reviewed: 07/24/2012 [facility] Patient Information 2015 [place], [place]. This information is not intended to replace advice given to you by your health care provider. Make sure you discuss any questions you have with your health care provider.

## 2014-12-27 ENCOUNTER — Other Ambulatory Visit: Payer: Self-pay | Admitting: Emergency Medicine

## 2015-01-21 ENCOUNTER — Other Ambulatory Visit: Payer: Self-pay | Admitting: Emergency Medicine

## 2015-01-28 ENCOUNTER — Other Ambulatory Visit: Payer: Self-pay | Admitting: *Deleted

## 2015-01-28 MED ORDER — RABEPRAZOLE SODIUM 20 MG PO TBEC: 20.0000 mg | DELAYED_RELEASE_TABLET | Freq: Every day | ORAL | Status: DC

## 2015-01-29 ENCOUNTER — Encounter: Payer: Self-pay | Admitting: Internal Medicine

## 2015-01-29 ENCOUNTER — Ambulatory Visit (INDEPENDENT_AMBULATORY_CARE_PROVIDER_SITE_OTHER): Payer: BLUE CROSS/BLUE SHIELD | Admitting: Internal Medicine

## 2015-01-29 VITALS — BP 132/84 | HR 68 | Temp 97.4°F | Resp 18 | Ht 64.0 in | Wt 194.8 lb

## 2015-01-29 DIAGNOSIS — E1310 Other specified diabetes mellitus with ketoacidosis without coma: Secondary | ICD-10-CM

## 2015-01-29 DIAGNOSIS — I10 Essential (primary) hypertension: Secondary | ICD-10-CM

## 2015-01-29 DIAGNOSIS — E782 Mixed hyperlipidemia: Secondary | ICD-10-CM

## 2015-01-29 DIAGNOSIS — E559 Vitamin D deficiency, unspecified: Secondary | ICD-10-CM

## 2015-01-29 DIAGNOSIS — Z79899 Other long term (current) drug therapy: Secondary | ICD-10-CM | POA: Insufficient documentation

## 2015-01-29 DIAGNOSIS — E1110 Type 2 diabetes mellitus with ketoacidosis without coma: Secondary | ICD-10-CM

## 2015-01-29 LAB — CBC WITH DIFFERENTIAL/PLATELET
BASOS ABS: 0 10*3/uL (ref 0.0–0.1)
BASOS PCT: 0 % (ref 0–1)
Eosinophils Absolute: 0.2 10*3/uL (ref 0.0–0.7)
Eosinophils Relative: 2 % (ref 0–5)
HCT: 39.8 % (ref 36.0–46.0)
Hemoglobin: 12.4 g/dL (ref 12.0–15.0)
LYMPHS PCT: 49 % — AB (ref 12–46)
Lymphs Abs: 4.1 10*3/uL — ABNORMAL HIGH (ref 0.7–4.0)
MCH: 26.7 pg (ref 26.0–34.0)
MCHC: 31.2 g/dL (ref 30.0–36.0)
MCV: 85.6 fL (ref 78.0–100.0)
MONOS PCT: 5 % (ref 3–12)
MPV: 10.8 fL (ref 8.6–12.4)
Monocytes Absolute: 0.4 10*3/uL (ref 0.1–1.0)
Neutro Abs: 3.7 10*3/uL (ref 1.7–7.7)
Neutrophils Relative %: 44 % (ref 43–77)
PLATELETS: 304 10*3/uL (ref 150–400)
RBC: 4.65 MIL/uL (ref 3.87–5.11)
RDW: 14.9 % (ref 11.5–15.5)
WBC: 8.4 10*3/uL (ref 4.0–10.5)

## 2015-01-29 NOTE — Patient Instructions (Signed)

## 2015-01-30 ENCOUNTER — Other Ambulatory Visit: Payer: Self-pay | Admitting: *Deleted

## 2015-01-30 LAB — HEMOGLOBIN A1C
HEMOGLOBIN A1C: 6.4 % — AB (ref <5.7)
Mean Plasma Glucose: 137 mg/dL — ABNORMAL HIGH (ref <117)

## 2015-01-30 LAB — LIPID PANEL
CHOLESTEROL: 225 mg/dL — AB (ref 0–200)
HDL: 104 mg/dL (ref >=46)
LDL Cholesterol: 98 mg/dL (ref 0–99)
Total CHOL/HDL Ratio: 2.2 Ratio
Triglycerides: 114 mg/dL (ref <150)
VLDL: 23 mg/dL (ref 0–40)

## 2015-01-30 LAB — BASIC METABOLIC PANEL WITH GFR
BUN: 17 mg/dL (ref 6–23)
CHLORIDE: 102 meq/L (ref 96–112)
CO2: 28 mEq/L (ref 19–32)
Calcium: 9.7 mg/dL (ref 8.4–10.5)
Creat: 0.93 mg/dL (ref 0.50–1.10)
GFR, EST NON AFRICAN AMERICAN: 66 mL/min
GFR, Est African American: 76 mL/min
Glucose, Bld: 98 mg/dL (ref 70–99)
POTASSIUM: 3.9 meq/L (ref 3.5–5.3)
Sodium: 139 mEq/L (ref 135–145)

## 2015-01-30 LAB — HEPATIC FUNCTION PANEL
ALK PHOS: 99 U/L (ref 39–117)
ALT: 18 U/L (ref 0–35)
AST: 20 U/L (ref 0–37)
Albumin: 3.9 g/dL (ref 3.5–5.2)
BILIRUBIN DIRECT: 0.1 mg/dL (ref 0.0–0.3)
BILIRUBIN TOTAL: 0.3 mg/dL (ref 0.2–1.2)
Indirect Bilirubin: 0.2 mg/dL (ref 0.2–1.2)
Total Protein: 6.8 g/dL (ref 6.0–8.3)

## 2015-01-30 LAB — TSH: TSH: 2.105 u[IU]/mL (ref 0.350–4.500)

## 2015-01-30 LAB — MAGNESIUM: Magnesium: 1.6 mg/dL (ref 1.5–2.5)

## 2015-01-30 LAB — INSULIN, FASTING: Insulin fasting, serum: 17.6 u[IU]/mL (ref 2.0–19.6)

## 2015-01-30 LAB — VITAMIN D 25 HYDROXY (VIT D DEFICIENCY, FRACTURES): Vit D, 25-Hydroxy: 36 ng/mL (ref 30–100)

## 2015-01-30 MED ORDER — RABEPRAZOLE SODIUM 20 MG PO TBEC: 20.0000 mg | DELAYED_RELEASE_TABLET | Freq: Every day | ORAL | Status: DC

## 2015-01-30 NOTE — Progress Notes (Signed)
Patient ID: Lauren Lopez, female   DOB: May 01, 1951, 64 y.o.   MRN: [id number]   This very nice 64 y.o.female presents for 3 month follow up with Hypertension, Hyperlipidemia, GERD, T2_NIDDM and Vitamin D Deficiency.    Patient is treated for HTN & BP has been controlled at home. Today's BP: 132/84 mmHg. Patient has had no complaints of any cardiac type chest pain, palpitations, dyspnea/orthopnea/PND, dizziness, claudication, or dependent edema.   Hyperlipidemia is controlled with diet & meds. Patient denies myalgias or other med SE's. Last Lipids were at goal - Total Chol 182; HDL 77; LDL 85; Trig 102 on 10/10/2014.   Also, the patient has history of T2_NIDDM attempting control with diet and has had no symptoms of reactive hypoglycemia, diabetic polys, paresthesias or visual blurring.  Last A1c was  6.2% on 10/10/2014.   Further, the patient also has history of Vitamin D Deficiency and supplements vitamin D without any suspected side-effects. Last vitamin D was 51 on  10/10/2014.  Medication Sig  . ADVAIR HFA 115-21  inhaler   . CALCIUM 600 tablet Take 600 mg by mouth daily.  Marland Kitchen VITAMIN D 1000 UNITS tablet Take 1,000 Units by mouth daily.  . cyanocobalamin 100 MCG tablet Take 100 mcg by mouth daily.  Marland Kitchen ENJUVIA 0.45 MG tablet TAKE 1 TABLET BY MOUTH EVERY DAY  . fexofenadine (ALLEGRA) 180 MG tablet Take 180 mg by mouth daily as needed.   Marland Kitchen glucose blood (FREESTYLE LITE) test strip Use as instructed  . hydrochlorothiazide  25 MG tablet TAKE 1 TABLET BY MOUTH EVERY DAY  . MAGNESIUM PO Take 250 mg by mouth daily.   . RABEprazole  20 MG tablet Take 1 tablet (20 mg total) by mouth daily.  . Red Yeast Rice Extract (RED YEAST RICE PO) Take 2,000 mg by mouth daily.   Allergies  Allergen Reactions  . Metronidazole Other (See Comments)    unknown  . Ppd [Tuberculin Purified Protein Derivative] Other (See Comments)    + PPD 2013 with NEG CXR 05/2012   PMHx:   Past Medical History  Diagnosis  Date  . Pneumonia 5/13  . Multiple allergies   . GERD (gastroesophageal reflux disease)   . Arthritis   . Bilateral carpal tunnel syndrome   . Wears glasses   . Wears dentures     upper  . Positive PPD   . Right rotator cuff tear 01/20/2013  . Hypertension   . Hyperlipidemia   . Unspecified vitamin D deficiency   . Asthma   . Anemia    Immunization History  Administered Date(s) Administered  . Pneumococcal-Unspecified 06/27/2013  . Tdap 04/23/2012   Past Surgical History  Procedure Laterality Date  . Bilateral salpingoophorectomy  11/28/2010    open laparoscopy with adhesiolysis also  . Knee arthroscopy  2005    rt  . Colonoscopy    . Carpal tunnel release  07/15/2012    Procedure: CARPAL TUNNEL RELEASE;  Surgeon: Cammie Sickle., MD;  Location: [place];  Service: Orthopedics;  Laterality: Left;  . Shoulder arthroscopy with rotator cuff repair and subacromial decompression Right 01/20/2013    Procedure: RIGHT ARTHROSCOPY SHOULDER DEBRIDEMENT LIMITED, ARTHROSCOPY SHOULDER DECOMPRESSION SUBACROMIAL PARTIAL ACROMIOPLASTY WITH CORACOACROMIAL RELEASE, ROTATOR CUFF REPAIR ;  Surgeon: Johnny Bridge, MD;  Location: [place];  Service: Orthopedics;  Laterality: Right;  RIGHT SHOULDER SCOPE DEBRIDEMENT, ACRIOMIOPLASTY, ROTATOR CUFF REPAIR  . Abdominal hysterectomy    . Carpal tunnel release Right  05/31/2014    Procedure: RIGHT CARPAL TUNNEL RELEASE;  Surgeon: Cammie Sickle, MD;  Location: [place];  Service: Orthopedics;  Laterality: Right;   FHx:    Reviewed / unchanged  SHx:    Reviewed / unchanged  Systems Review:  Constitutional: Denies fever, chills, wt changes, headaches, insomnia, fatigue, night sweats, change in appetite. Eyes: Denies redness, blurred vision, diplopia, discharge, itchy, watery eyes.  ENT: Denies discharge, congestion, post nasal drip, epistaxis, sore throat, earache, hearing loss, dental pain,  tinnitus, vertigo, sinus pain, snoring.  CV: Denies chest pain, palpitations, irregular heartbeat, syncope, dyspnea, diaphoresis, orthopnea, PND, claudication or edema. Respiratory: denies cough, dyspnea, DOE, pleurisy, hoarseness, laryngitis, wheezing.  Gastrointestinal: Denies dysphagia, odynophagia, heartburn, reflux, water brash, abdominal pain or cramps, nausea, vomiting, bloating, diarrhea, constipation, hematemesis, melena, hematochezia  or hemorrhoids. Genitourinary: Denies dysuria, frequency, urgency, nocturia, hesitancy, discharge, hematuria or flank pain. Musculoskeletal: Denies arthralgias, myalgias, stiffness, jt. swelling, pain, limping or strain/sprain.  Skin: Denies pruritus, rash, hives, warts, acne, eczema or change in skin lesion(s). Neuro: No weakness, tremor, incoordination, spasms, paresthesia or pain. Psychiatric: Denies confusion, memory loss or sensory loss. Endo: Denies change in weight, skin or hair change.  Heme/Lymph: No excessive bleeding, bruising or enlarged lymph nodes.  Physical Exam  BP 132/84   Pulse 68  Temp 97.4 F Resp 18  Ht 5\' 4"    Wt 194 lb 12.8 oz     BMI 33.42   Appears well nourished and in no distress. Eyes: PERRLA, EOMs, conjunctiva no swelling or erythema. Sinuses: No frontal/maxillary tenderness ENT/Mouth: EAC's clear, TM's nl w/o erythema, bulging. Nares clear w/o erythema, swelling, exudates. Oropharynx clear without erythema or exudates. Oral hygiene is good. Tongue normal, non obstructing. Hearing intact.  Neck: Supple. Thyroid nl. Car 2+/2+ without bruits, nodes or JVD. Chest: Respirations nl with BS clear & equal w/o rales, rhonchi, wheezing or stridor.  Cor: Heart sounds normal w/ regular rate and rhythm without sig. murmurs, gallops, clicks, or rubs. Peripheral pulses normal and equal  without edema.  Abdomen: Soft & bowel sounds normal. Non-tender w/o guarding, rebound, hernias, masses, or organomegaly.  Lymphatics: Unremarkable.   Musculoskeletal: Full ROM all peripheral extremities, joint stability, 5/5 strength, and normal gait.  Skin: Warm, dry without exposed rashes, lesions or ecchymosis apparent.  Neuro: Cranial nerves intact, reflexes equal bilaterally. Sensory-motor testing grossly intact. Tendon reflexes grossly intact.  Pysch: Alert & oriented x 3.  Insight and judgement nl & appropriate. No ideations.  Assessment and Plan:  1. Essential hypertension  - TSH  2. Mixed hyperlipidemia  - Lipid panel  3. T2_NIDDM  - Hemoglobin A1c - Insulin, fasting  4. Vitamin D deficiency  - Vit D  25 hydroxy (rtn osteoporosis monitoring)  5. Medication management - CBC with Differential/Platelet - BASIC METABOLIC PANEL WITH GFR - Hepatic function panel - Magnesium   Recommended regular exercise, BP monitoring, weight control, and discussed med and SE's. Recommended labs to assess and monitor clinical status. Further disposition pending results of labs.

## 2015-03-05 ENCOUNTER — Other Ambulatory Visit: Payer: Self-pay | Admitting: Allergy

## 2015-03-05 ENCOUNTER — Ambulatory Visit
Admission: RE | Admit: 2015-03-05 | Discharge: 2015-03-05 | Disposition: A | Discharge status: Home / Self Care | Payer: BLUE CROSS/BLUE SHIELD | Source: Ambulatory Visit | Attending: Allergy | Admitting: Allergy

## 2015-03-05 DIAGNOSIS — J209 Acute bronchitis, unspecified: Secondary | ICD-10-CM

## 2015-05-13 ENCOUNTER — Ambulatory Visit: Payer: Self-pay | Admitting: Internal Medicine

## 2015-05-19 IMAGING — CR DG CHEST 2V
2 series · 2 of 2 positions shown · non-contrast
Comparison: 04/17/2014.

CLINICAL DATA: Cough the past 5 days with wheezing and shortness of
breath. Asthma. Clinical diagnosis of acute bronchitis.

EXAM:
CHEST  2 VIEW

[view not recorded (1 of 2)]
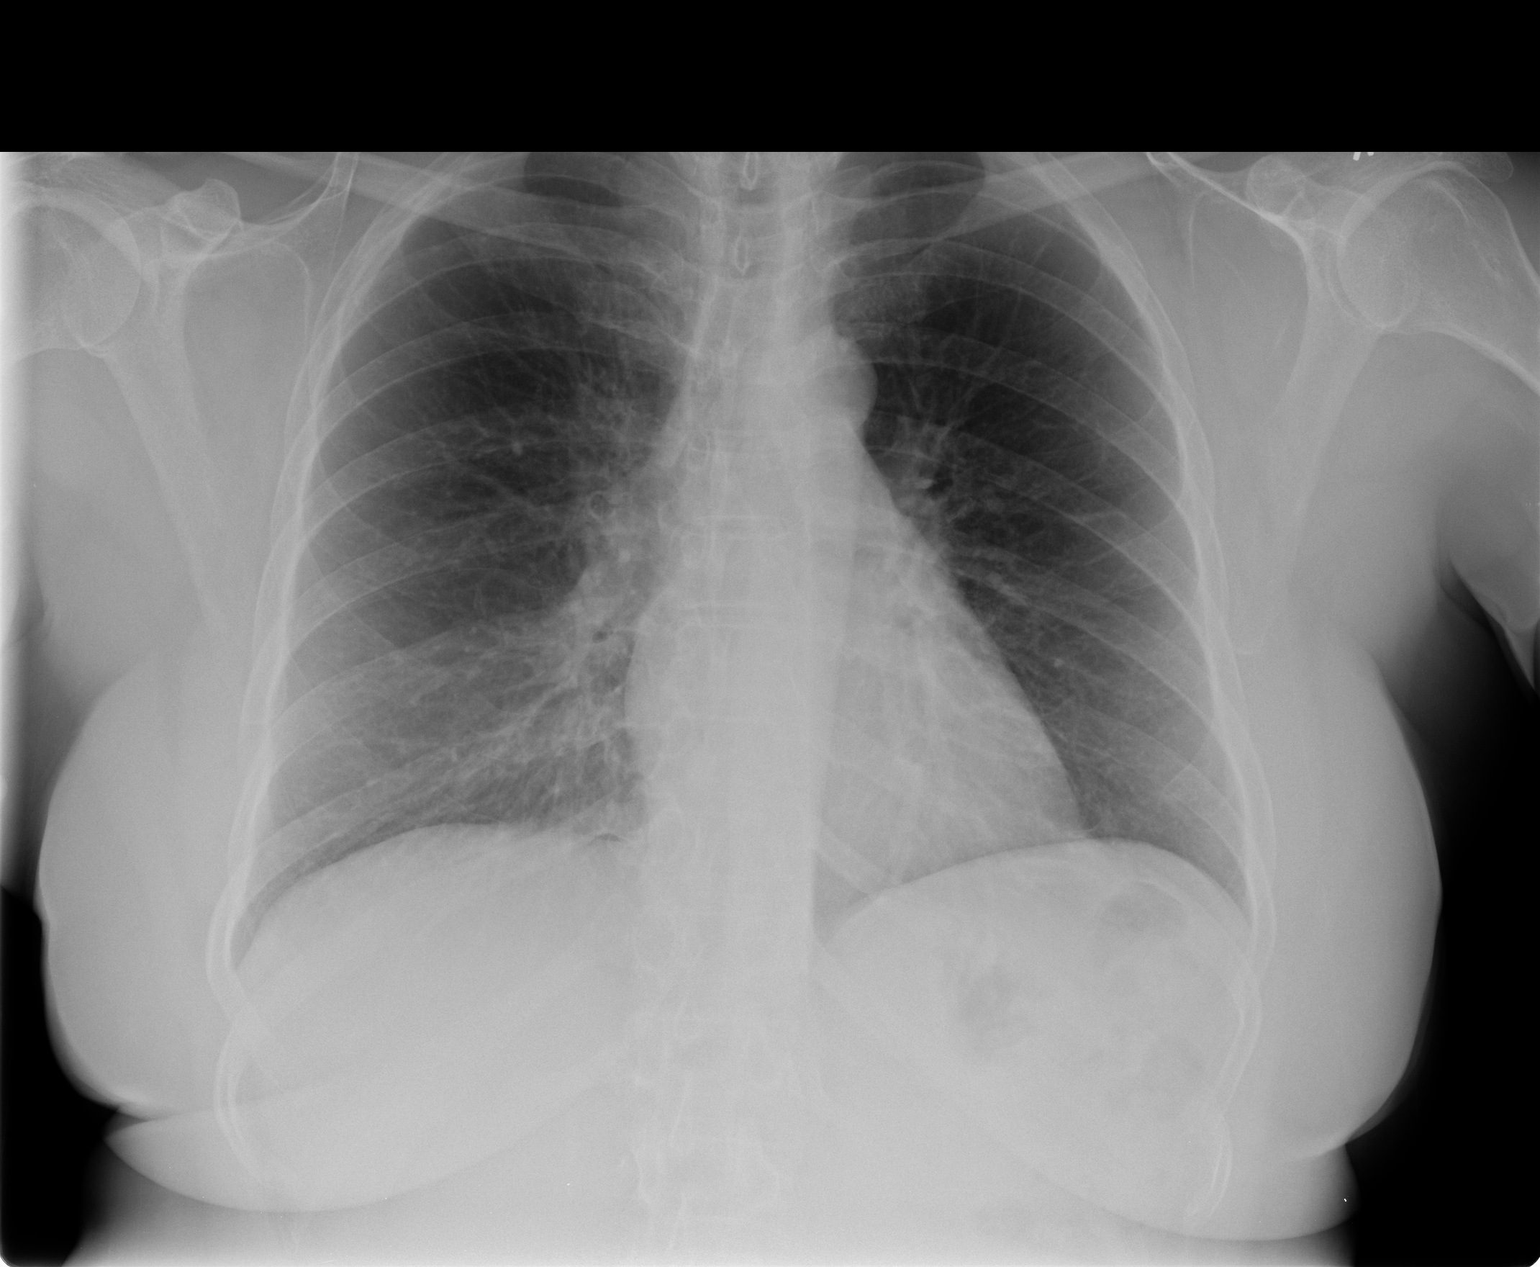

[view not recorded (2 of 2)]
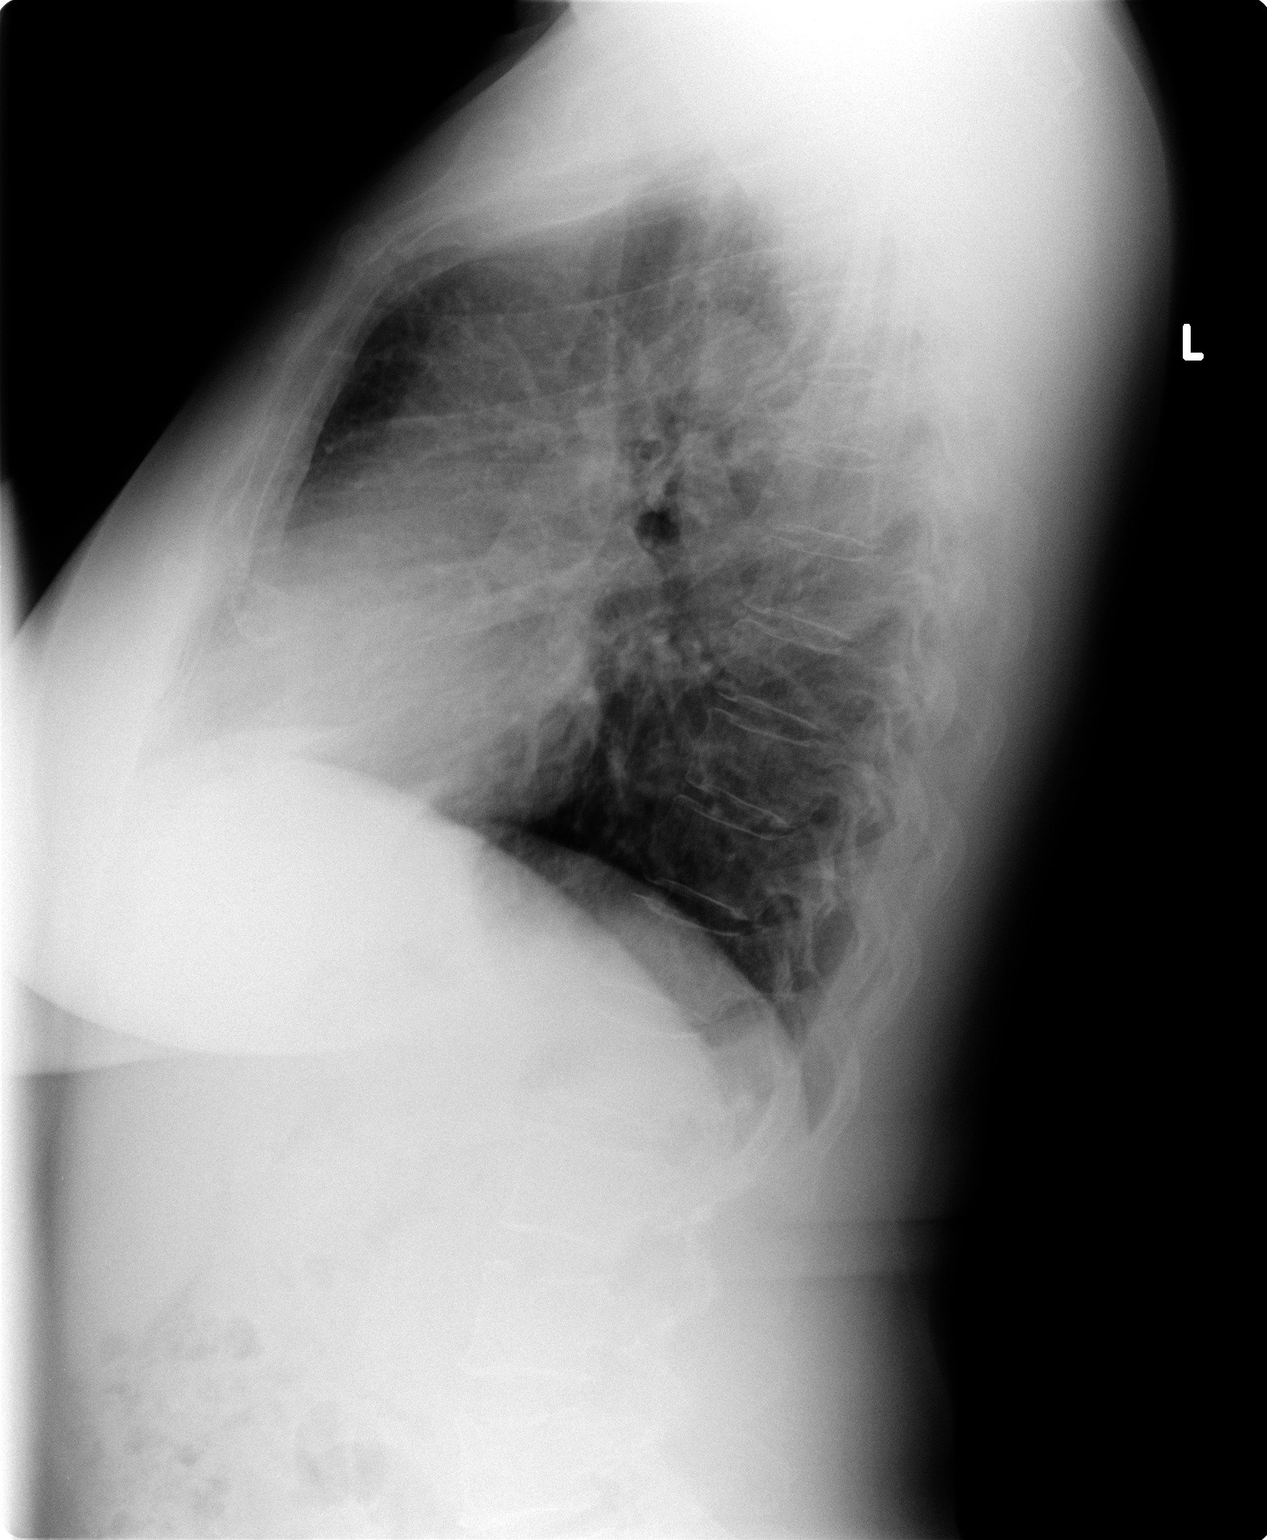

[2 of 2 positions shown; findings below may reference images not displayed]

FINDINGS: Normal sized heart. Clear lungs. Interval mild central peribronchial
thickening. Mild thoracic spine degenerative changes.
IMPRESSION: Interval mild bronchitic changes.

## 2015-06-27 ENCOUNTER — Other Ambulatory Visit: Payer: Self-pay | Admitting: Radiology

## 2015-07-10 ENCOUNTER — Encounter: Payer: Self-pay | Admitting: Internal Medicine

## 2015-07-10 ENCOUNTER — Ambulatory Visit (INDEPENDENT_AMBULATORY_CARE_PROVIDER_SITE_OTHER): Payer: BLUE CROSS/BLUE SHIELD | Admitting: Internal Medicine

## 2015-07-10 ENCOUNTER — Encounter: Payer: Self-pay | Admitting: Emergency Medicine

## 2015-07-10 VITALS — BP 122/64 | HR 62 | Temp 98.2°F | Resp 18 | Ht 62.5 in | Wt 187.0 lb

## 2015-07-10 DIAGNOSIS — Z780 Asymptomatic menopausal state: Secondary | ICD-10-CM

## 2015-07-10 DIAGNOSIS — I10 Essential (primary) hypertension: Secondary | ICD-10-CM | POA: Diagnosis not present

## 2015-07-10 DIAGNOSIS — E1110 Type 2 diabetes mellitus with ketoacidosis without coma: Secondary | ICD-10-CM

## 2015-07-10 DIAGNOSIS — Z0001 Encounter for general adult medical examination with abnormal findings: Secondary | ICD-10-CM

## 2015-07-10 DIAGNOSIS — E1310 Other specified diabetes mellitus with ketoacidosis without coma: Secondary | ICD-10-CM | POA: Diagnosis not present

## 2015-07-10 DIAGNOSIS — D539 Nutritional anemia, unspecified: Secondary | ICD-10-CM

## 2015-07-10 DIAGNOSIS — E559 Vitamin D deficiency, unspecified: Secondary | ICD-10-CM | POA: Diagnosis not present

## 2015-07-10 DIAGNOSIS — Z79899 Other long term (current) drug therapy: Secondary | ICD-10-CM

## 2015-07-10 DIAGNOSIS — Z1212 Encounter for screening for malignant neoplasm of rectum: Secondary | ICD-10-CM | POA: Diagnosis not present

## 2015-07-10 DIAGNOSIS — E782 Mixed hyperlipidemia: Secondary | ICD-10-CM

## 2015-07-10 DIAGNOSIS — E669 Obesity, unspecified: Secondary | ICD-10-CM | POA: Diagnosis not present

## 2015-07-10 DIAGNOSIS — R6889 Other general symptoms and signs: Secondary | ICD-10-CM

## 2015-07-10 NOTE — Progress Notes (Signed)
Complete Physical  Assessment and Plan:   1. Encounter for general adult medical examination with abnormal findings  - CBC with Differential/Platelet - BASIC METABOLIC PANEL WITH GFR - Hepatic function panel - Magnesium - Lipid panel - Hemoglobin A1c - Insulin, random - Iron and TIBC - Vitamin B12 - Urinalysis, Routine w reflex microscopic (not at [facility]) - Microalbumin / creatinine urine ratio - EKG 12-Lead - [name], RETROPERITNL ABD,  LTD - Vit D  25 hydroxy (rtn osteoporosis monitoring) - TSH  2. Essential hypertension  - Urinalysis, Routine w reflex microscopic (not at [facility]) - Microalbumin / creatinine urine ratio - EKG 12-Lead - [name], RETROPERITNL ABD,  LTD - TSH  3. T2_NIDDM  - Hemoglobin A1c - Insulin, random  4. Mixed hyperlipidemia -diet and exercise - Lipid panel  5. Vitamin D deficiency -cont supplement - Vit D  25 hydroxy (rtn osteoporosis monitoring)  6. Medication management  - CBC with Differential/Platelet - BASIC METABOLIC PANEL WITH GFR - Hepatic function panel - Magnesium  7. Screening for rectal cancer -hemoccult  8. Postmenopausal -given history of cancer and no recent paps, and questions about coming off estrogen will send back to Gyn - Ambulatory referral to Gynecology  9. Deficiency anemia  - Iron and TIBC - Vitamin B12    Discussed med's effects and SE's. Screening labs and tests as requested with regular follow-up as recommended.  HPI  64 y.o. female  presents for a complete physical.  Her blood pressure has been controlled at home, today their BP is BP: 122/64 mmHg.  She does not workout. She denies chest pain, shortness of breath, dizziness. She is currently doing some therapy for her knee post arthroscopy.  She reports that her job is physical too.    She is on cholesterol medication and denies myalgias. Her cholesterol is at goal. The cholesterol last visit was:  Lab Results  Component Value Date   CHOL 225* 01/29/2015    HDL 104 01/29/2015   LDLCALC 98 01/29/2015   TRIG 114 01/29/2015   CHOLHDL 2.2 01/29/2015  .  She has been working on diet and exercise for prediabetes, she is not on bASA, she is on ACE/ARB and denies foot ulcerations, hyperglycemia, hypoglycemia , increased appetite, nausea, paresthesia of the feet, polydipsia, polyuria, visual disturbances, vomiting and weight loss. Last A1C in the office was:  Lab Results  Component Value Date   HGBA1C 6.4* 01/29/2015    Patient is on Vitamin D supplement.   Lab Results  Component Value Date   VD25OH 36 01/29/2015     Patient reports that she did recently have biopsy of her breast due to a spot seen on a mammogran.  She reports that the biopsy is benign.    Current Medications:  Current Outpatient Prescriptions on File Prior to Visit  Medication Sig Dispense Refill  . ADVAIR HFA 115-21 MCG/ACT inhaler   6  . calcium carbonate (CALCIUM 600) 600 MG TABS tablet Take 600 mg by mouth daily.    . cyanocobalamin 100 MCG tablet Take 100 mcg by mouth daily.    Marland Kitchen ENJUVIA 0.45 MG tablet TAKE 1 TABLET BY MOUTH EVERY DAY 90 tablet 2  . fexofenadine (ALLEGRA) 180 MG tablet Take 180 mg by mouth daily as needed.     Marland Kitchen glucose blood (FREESTYLE LITE) test strip Use as instructed 100 each 12  . hydrochlorothiazide (HYDRODIURIL) 25 MG tablet TAKE 1 TABLET BY MOUTH EVERY DAY 90 tablet 3  . MAGNESIUM PO Take  500 mg by mouth daily.     . RABEprazole (ACIPHEX) 20 MG tablet Take 1 tablet (20 mg total) by mouth daily. 90 tablet 1  . Red Yeast Rice Extract (RED YEAST RICE PO) Take 2,000 mg by mouth daily.     No current facility-administered medications on file prior to visit.    Health Maintenance:   Immunization History  Administered Date(s) Administered  . Pneumococcal-Unspecified 06/27/2013  . Tdap 04/23/2012    Tetanus:2013 Pneumovax:2014 MGM: 2016 DEXA: 2014, patient would like to wait to 2017 due to missed work Colonoscopy: 2009 Last Dental Exam:   Dr. Mariea Clonts Last Eye Exam:  Dr. Herbert Deaner  Patient Care Team: Unk Pinto, MD as PCP - General (Internal Medicine) Theodis Sato, MD as Consulting Physician (Orthopedic Surgery) Monna Fam, MD as Consulting Physician (Ophthalmology) Richmond Campbell, MD as Consulting Physician (Gastroenterology) Cheri Fowler, MD as Consulting Physician (Obstetrics and Gynecology) Gaynelle Arabian, MD as Consulting Physician (Orthopedic Surgery) Marchia Bond, MD as Consulting Physician (Orthopedic Surgery) Mosetta Anis, MD as Referring Physician (Allergy) Larey Dresser, MD as Consulting Physician (Cardiology)  Allergies:  Allergies  Allergen Reactions  . Metronidazole Other (See Comments)    unknown  . Ppd [Tuberculin Purified Protein Derivative] Other (See Comments)    + PPD 2013 with NEG CXR 05/2012    Medical History:  Past Medical History  Diagnosis Date  . Pneumonia 5/13  . Multiple allergies   . GERD (gastroesophageal reflux disease)   . Arthritis   . Bilateral carpal tunnel syndrome   . Wears glasses   . Wears dentures     upper  . Positive PPD   . Right rotator cuff tear 01/20/2013  . Hypertension   . Hyperlipidemia   . Unspecified vitamin D deficiency   . Asthma   . Anemia     Surgical History:  Past Surgical History  Procedure Laterality Date  . Bilateral salpingoophorectomy  11/28/2010    open laparoscopy with adhesiolysis also  . Knee arthroscopy  2005    rt  . Colonoscopy    . Carpal tunnel release  07/15/2012    Procedure: CARPAL TUNNEL RELEASE;  Surgeon: Cammie Sickle., MD;  Location: [place];  Service: Orthopedics;  Laterality: Left;  . Shoulder arthroscopy with rotator cuff repair and subacromial decompression Right 01/20/2013    Procedure: RIGHT ARTHROSCOPY SHOULDER DEBRIDEMENT LIMITED, ARTHROSCOPY SHOULDER DECOMPRESSION SUBACROMIAL PARTIAL ACROMIOPLASTY WITH CORACOACROMIAL RELEASE, ROTATOR CUFF REPAIR ;  Surgeon: Johnny Bridge, MD;   Location: [place];  Service: Orthopedics;  Laterality: Right;  RIGHT SHOULDER SCOPE DEBRIDEMENT, ACRIOMIOPLASTY, ROTATOR CUFF REPAIR  . Abdominal hysterectomy    . Carpal tunnel release Right 05/31/2014    Procedure: RIGHT CARPAL TUNNEL RELEASE;  Surgeon: Cammie Sickle, MD;  Location: [place];  Service: Orthopedics;  Laterality: Right;    Family History:  Family History  Problem Relation Age of Onset  . Hypertension Mother   . Diabetes Mother   . Stroke Mother   . Hypertension Father   . Heart disease Brother   . Hyperlipidemia Brother   . Diabetes Brother     Social History:  Social History  Substance Use Topics  . Smoking status: Never Smoker   . Smokeless tobacco: None  . Alcohol Use: No    Review of Systems: Review of Systems  Constitutional: Positive for malaise/fatigue. Negative for fever and chills.  HENT: Negative for congestion, ear pain and sore throat.  Eyes: Negative.   Respiratory: Positive for wheezing. Negative for cough and shortness of breath.   Cardiovascular: Negative for chest pain, palpitations and leg swelling.  Gastrointestinal: Negative for heartburn, diarrhea, constipation, blood in stool and melena.  Genitourinary: Negative.   Musculoskeletal: Positive for joint pain.  Skin: Negative.   Neurological: Negative for dizziness, sensory change, loss of consciousness and headaches.  Psychiatric/Behavioral: Negative for depression. The patient is not nervous/anxious and does not have insomnia.     Physical Exam: Estimated body mass index is 33.64 kg/(m^2) as calculated from the following:   Height as of this encounter: 5' 2.5" (1.588 m).   Weight as of this encounter: 187 lb (84.823 kg). BP 122/64 mmHg  Pulse 62  Temp(Src) 98.2 F (36.8 C) (Temporal)  Resp 18  Ht 5' 2.5" (1.588 m)  Wt 187 lb (84.823 kg)  BMI 33.64 kg/m2  General Appearance: Well nourished well developed, in no apparent distress.  Eyes:  PERRLA, EOMs, conjunctiva no swelling or erythema ENT/Mouth: Ear canals normal without obstruction, swelling, erythema, or discharge.  TMs normal bilaterally with no erythema, bulging, retraction, or loss of landmark.  Oropharynx moist and clear with no exudate, erythema, or swelling.   Neck: Supple, thyroid normal. No bruits.  No cervical adenopathy Respiratory: Respiratory effort normal, Breath sounds clear A&P without wheeze, rhonchi, rales.   Cardio: RRR without murmurs, rubs or gallops. Brisk peripheral pulses without edema.  Chest: symmetric, with normal excursions Breasts: Symmetric, without lumps, nipple discharge, retractions.  Abdomen: Soft, nontender, no guarding, rebound, hernias, masses, or organomegaly.  Lymphatics: Non tender without lymphadenopathy.  Genitourinary:  Musculoskeletal: Full ROM all peripheral extremities,5/5 strength, and normal gait.  Skin: Warm, dry without rashes, lesions, ecchymosis.  Smooth mobile skin cyst on the left shin.  No evidence of infection. Neuro: Awake and oriented X 3, Cranial nerves intact, reflexes equal bilaterally. Normal muscle tone, no cerebellar symptoms. Sensation intact.  Psych:  normal affect, Insight and Judgment appropriate.   EKG: WNL no changes.  AORTA SCAN: WNL   Over 40 minutes of exam, counseling, chart review and critical decision making was performed  Loma Sousa Forcucci 10:30 AM [facility]

## 2015-07-10 NOTE — Patient Instructions (Signed)
Call to see if your insurance will cover your shingles vaccine.  You can set up a nurse visit if they do.    Preventive Care for Adults  A healthy lifestyle and preventive care can promote health and wellness. Preventive health guidelines for women include the following key practices.  A routine yearly physical is a good way to check with your health care provider about your health and preventive screening. It is a chance to share any concerns and updates on your health and to receive a thorough exam.  Visit your dentist for a routine exam and preventive care every 6 months. Brush your teeth twice a day and floss once a day. Good oral hygiene prevents tooth decay and gum disease.  The frequency of eye exams is based on your age, health, family medical history, use of contact lenses, and other factors. Follow your health care provider's recommendations for frequency of eye exams.  Eat a healthy diet. Foods like vegetables, fruits, whole grains, low-fat dairy products, and lean protein foods contain the nutrients you need without too many calories. Decrease your intake of foods high in solid fats, added sugars, and salt. Eat the right amount of calories for you.Get information about a proper diet from your health care provider, if necessary.  Regular physical exercise is one of the most important things you can do for your health. Most adults should get at least 150 minutes of moderate-intensity exercise (any activity that increases your heart rate and causes you to sweat) each week. In addition, most adults need muscle-strengthening exercises on 2 or more days a week.  Maintain a healthy weight. The body mass index (BMI) is a screening tool to identify possible weight problems. It provides an estimate of body fat based on height and weight. Your health care provider can find your BMI and can help you achieve or maintain a healthy weight.For adults 20 years and older:  A BMI below 18.5 is  considered underweight.  A BMI of 18.5 to 24.9 is normal.  A BMI of 25 to 29.9 is considered overweight.  A BMI of 30 and above is considered obese.  Maintain normal blood lipids and cholesterol levels by exercising and minimizing your intake of saturated fat. Eat a balanced diet with plenty of fruit and vegetables. Blood tests for lipids and cholesterol should begin at age 79 and be repeated every 5 years. If your lipid or cholesterol levels are high, you are over 50, or you are at high risk for heart disease, you may need your cholesterol levels checked more frequently.Ongoing high lipid and cholesterol levels should be treated with medicines if diet and exercise are not working.  If you smoke, find out from your health care provider how to quit. If you do not use tobacco, do not start.  Lung cancer screening is recommended for adults aged 36-80 years who are at high risk for developing lung cancer because of a history of smoking. A yearly low-dose CT scan of the lungs is recommended for people who have at least a 30-pack-year history of smoking and are a current smoker or have quit within the past 15 years. A pack year of smoking is smoking an average of 1 pack of cigarettes a day for 1 year (for example: 1 pack a day for 30 years or 2 packs a day for 15 years). Yearly screening should continue until the smoker has stopped smoking for at least 15 years. Yearly screening should be stopped for people who  develop a health problem that would prevent them from having lung cancer treatment.  High blood pressure causes heart disease and increases the risk of stroke. Your blood pressure should be checked at least every 1 to 2 years. Ongoing high blood pressure should be treated with medicines if weight loss and exercise do not work.  If you are 35-51 years old, ask your health care provider if you should take aspirin to prevent strokes.  Diabetes screening involves taking a blood sample to check your  fasting blood sugar level. This should be done once every 3 years, after age 10, if you are within normal weight and without risk factors for diabetes. Testing should be considered at a younger age or be carried out more frequently if you are overweight and have at least 1 risk factor for diabetes.  Breast cancer screening is essential preventive care for women. You should practice "breast self-awareness." This means understanding the normal appearance and feel of your breasts and may include breast self-examination. Any changes detected, no matter how small, should be reported to a health care provider. Women in their 70s and 30s should have a clinical breast exam (CBE) by a health care provider as part of a regular health exam every 1 to 3 years. After age 60, women should have a CBE every year. Starting at age 81, women should consider having a mammogram (breast X-ray test) every year. Women who have a family history of breast cancer should talk to their health care provider about genetic screening. Women at a high risk of breast cancer should talk to their health care providers about having an MRI and a mammogram every year.  Breast cancer gene (BRCA)-related cancer risk assessment is recommended for women who have family members with BRCA-related cancers. BRCA-related cancers include breast, ovarian, tubal, and peritoneal cancers. Having family members with these cancers may be associated with an increased risk for harmful changes (mutations) in the breast cancer genes BRCA1 and BRCA2. Results of the assessment will determine the need for genetic counseling and BRCA1 and BRCA2 testing.  Routine pelvic exams to screen for cancer are no longer recommended for nonpregnant women who are considered low risk for cancer of the pelvic organs (ovaries, uterus, and vagina) and who do not have symptoms. Ask your health care provider if a screening pelvic exam is right for you.  If you have had past treatment for  cervical cancer or a condition that could lead to cancer, you need Pap tests and screening for cancer for at least 20 years after your treatment. If Pap tests have been discontinued, your risk factors (such as having a new sexual partner) need to be reassessed to determine if screening should be resumed. Some women have medical problems that increase the chance of getting cervical cancer. In these cases, your health care provider may recommend more frequent screening and Pap tests.  Colorectal cancer can be detected and often prevented. Most routine colorectal cancer screening begins at the age of 11 years and continues through age 78 years. However, your health care provider may recommend screening at an earlier age if you have risk factors for colon cancer. On a yearly basis, your health care provider may provide home test kits to check for hidden blood in the stool. Use of a small camera at the end of a tube, to directly examine the colon (sigmoidoscopy or colonoscopy), can detect the earliest forms of colorectal cancer. Talk to your health care provider about this at age  50, when routine screening begins. Direct exam of the colon should be repeated every 5-10 years through age 69 years, unless early forms of pre-cancerous polyps or small growths are found.  Hepatitis C blood testing is recommended for all people born from [birth year] through 1965 and any individual with known risks for hepatitis C.  Pra  Osteoporosis is a disease in which the bones lose minerals and strength with aging. This can result in serious bone fractures or breaks. The risk of osteoporosis can be identified using a bone density scan. Women ages 38 years and over and women at risk for fractures or osteoporosis should discuss screening with their health care providers. Ask your health care provider whether you should take a calcium supplement or vitamin D to reduce the rate of osteoporosis.  Menopause can be associated with physical  symptoms and risks. Hormone replacement therapy is available to decrease symptoms and risks. You should talk to your health care provider about whether hormone replacement therapy is right for you.  Use sunscreen. Apply sunscreen liberally and repeatedly throughout the day. You should seek shade when your shadow is shorter than you. Protect yourself by wearing long sleeves, pants, a wide-brimmed hat, and sunglasses year round, whenever you are outdoors.  Once a month, do a whole body skin exam, using a mirror to look at the skin on your back. Tell your health care provider of new moles, moles that have irregular borders, moles that are larger than a pencil eraser, or moles that have changed in shape or color.  Stay current with required vaccines (immunizations).  Influenza vaccine. All adults should be immunized every year.  Tetanus, diphtheria, and acellular pertussis (Td, Tdap) vaccine. Pregnant women should receive 1 dose of Tdap vaccine during each pregnancy. The dose should be obtained regardless of the length of time since the last dose. Immunization is preferred during the 27th-36th week of gestation. An adult who has not previously received Tdap or who does not know her vaccine status should receive 1 dose of Tdap. This initial dose should be followed by tetanus and diphtheria toxoids (Td) booster doses every 10 years. Adults with an unknown or incomplete history of completing a 3-dose immunization series with Td-containing vaccines should begin or complete a primary immunization series including a Tdap dose. Adults should receive a Td booster every 10 years.  Varicella vaccine. An adult without evidence of immunity to varicella should receive 2 doses or a second dose if she has previously received 1 dose. Pregnant females who do not have evidence of immunity should receive the first dose after pregnancy. This first dose should be obtained before leaving the health care facility. The second dose  should be obtained 4-8 weeks after the first dose.  Human papillomavirus (HPV) vaccine. Females aged 13-26 years who have not received the vaccine previously should obtain the 3-dose series. The vaccine is not recommended for use in pregnant females. However, pregnancy testing is not needed before receiving a dose. If a female is found to be pregnant after receiving a dose, no treatment is needed. In that case, the remaining doses should be delayed until after the pregnancy. Immunization is recommended for any person with an immunocompromised condition through the age of 92 years if she did not get any or all doses earlier. During the 3-dose series, the second dose should be obtained 4-8 weeks after the first dose. The third dose should be obtained 24 weeks after the first dose and 16 weeks after the second  dose.  Zoster vaccine. One dose is recommended for adults aged 22 years or older unless certain conditions are present.  Measles, mumps, and rubella (MMR) vaccine. Adults born before 22 generally are considered immune to measles and mumps. Adults born in [birth year] or later should have 1 or more doses of MMR vaccine unless there is a contraindication to the vaccine or there is laboratory evidence of immunity to each of the three diseases. A routine second dose of MMR vaccine should be obtained at least 28 days after the first dose for students attending postsecondary schools, health care workers, or international travelers. People who received inactivated measles vaccine or an unknown type of measles vaccine during 1963-1967 should receive 2 doses of MMR vaccine. People who received inactivated mumps vaccine or an unknown type of mumps vaccine before 1979 and are at high risk for mumps infection should consider immunization with 2 doses of MMR vaccine. For females of childbearing age, rubella immunity should be determined. If there is no evidence of immunity, females who are not pregnant should be vaccinated.  If there is no evidence of immunity, females who are pregnant should delay immunization until after pregnancy. Unvaccinated health care workers born before [birth year] who lack laboratory evidence of measles, mumps, or rubella immunity or laboratory confirmation of disease should consider measles and mumps immunization with 2 doses of MMR vaccine or rubella immunization with 1 dose of MMR vaccine.  Pneumococcal 13-valent conjugate (PCV13) vaccine. When indicated, a person who is uncertain of her immunization history and has no record of immunization should receive the PCV13 vaccine. An adult aged 64 years or older who has certain medical conditions and has not been previously immunized should receive 1 dose of PCV13 vaccine. This PCV13 should be followed with a dose of pneumococcal polysaccharide (PPSV23) vaccine. The PPSV23 vaccine dose should be obtained at least 8 weeks after the dose of PCV13 vaccine. An adult aged 11 years or older who has certain medical conditions and previously received 1 or more doses of PPSV23 vaccine should receive 1 dose of PCV13. The PCV13 vaccine dose should be obtained 1 or more years after the last PPSV23 vaccine dose.    Pneumococcal polysaccharide (PPSV23) vaccine. When PCV13 is also indicated, PCV13 should be obtained first. All adults aged 58 years and older should be immunized. An adult younger than age 75 years who has certain medical conditions should be immunized. Any person who resides in a nursing home or long-term care facility should be immunized. An adult smoker should be immunized. People with an immunocompromised condition and certain other conditions should receive both PCV13 and PPSV23 vaccines. People with human immunodeficiency virus (HIV) infection should be immunized as soon as possible after diagnosis. Immunization during chemotherapy or radiation therapy should be avoided. Routine use of PPSV23 vaccine is not recommended for American Indians, [place]atives, or  people younger than 65 years unless there are medical conditions that require PPSV23 vaccine. When indicated, people who have unknown immunization and have no record of immunization should receive PPSV23 vaccine. One-time revaccination 5 years after the first dose of PPSV23 is recommended for people aged 19-64 years who have chronic kidney failure, nephrotic syndrome, asplenia, or immunocompromised conditions. People who received 1-2 doses of PPSV23 before age 34 years should receive another dose of PPSV23 vaccine at age 36 years or later if at least 5 years have passed since the previous dose. Doses of PPSV23 are not needed for people immunized with PPSV23 at or after age  65 years.  Preventive Services / Frequency   Ages 67 to 64 years  Blood pressure check.  Lipid and cholesterol check.  Lung cancer screening. / Every year if you are aged 80-80 years and have a 30-pack-year history of smoking and currently smoke or have quit within the past 15 years. Yearly screening is stopped once you have quit smoking for at least 15 years or develop a health problem that would prevent you from having lung cancer treatment.  Clinical breast exam.** / Every year after age 49 years.  BRCA-related cancer risk assessment.** / For women who have family members with a BRCA-related cancer (breast, ovarian, tubal, or peritoneal cancers).  Mammogram.** / Every year beginning at age 63 years and continuing for as long as you are in good health. Consult with your health care provider.  Pap test.** / Every 3 years starting at age 64 years through age 75 or 3 years with a history of 3 consecutive normal Pap tests.  HPV screening.** / Every 3 years from ages 34 years through ages 35 to 41 years with a history of 3 consecutive normal Pap tests.  Fecal occult blood test (FOBT) of stool. / Every year beginning at age 43 years and continuing until age 25 years. You may not need to do this test if you get a colonoscopy  every 10 years.  Flexible sigmoidoscopy or colonoscopy.** / Every 5 years for a flexible sigmoidoscopy or every 10 years for a colonoscopy beginning at age 34 years and continuing until age 27 years.  Hepatitis C blood test.** / For all people born from [birth year] through 1965 and any individual with known risks for hepatitis C.  Skin self-exam. / Monthly.  Influenza vaccine. / Every year.  Tetanus, diphtheria, and acellular pertussis (Tdap/Td) vaccine.** / Consult your health care provider. Pregnant women should receive 1 dose of Tdap vaccine during each pregnancy. 1 dose of Td every 10 years.  Varicella vaccine.** / Consult your health care provider. Pregnant females who do not have evidence of immunity should receive the first dose after pregnancy.  Zoster vaccine.** / 1 dose for adults aged 11 years or older.  Pneumococcal 13-valent conjugate (PCV13) vaccine.** / Consult your health care provider.  Pneumococcal polysaccharide (PPSV23) vaccine.** / 1 to 2 doses if you smoke cigarettes or if you have certain conditions.  Meningococcal vaccine.** / Consult your health care provider.  Hepatitis A vaccine.** / Consult your health care provider.  Hepatitis B vaccine.** / Consult your health care provider. Screening for abdominal aortic aneurysm (AAA)  by ultrasound is recommended for people over 50 who have history of high blood pressure or who are current or former smokers.

## 2015-07-11 LAB — CBC WITH DIFFERENTIAL/PLATELET
BASOS PCT: 0 % (ref 0–1)
Basophils Absolute: 0 10*3/uL (ref 0.0–0.1)
Eosinophils Absolute: 0.1 10*3/uL (ref 0.0–0.7)
Eosinophils Relative: 2 % (ref 0–5)
HEMATOCRIT: 40.4 % (ref 36.0–46.0)
Hemoglobin: 12.6 g/dL (ref 12.0–15.0)
Lymphocytes Relative: 54 % — ABNORMAL HIGH (ref 12–46)
Lymphs Abs: 3 10*3/uL (ref 0.7–4.0)
MCH: 26.4 pg (ref 26.0–34.0)
MCHC: 31.2 g/dL (ref 30.0–36.0)
MCV: 84.7 fL (ref 78.0–100.0)
MONO ABS: 0.4 10*3/uL (ref 0.1–1.0)
MONOS PCT: 7 % (ref 3–12)
MPV: 10.6 fL (ref 8.6–12.4)
NEUTROS ABS: 2 10*3/uL (ref 1.7–7.7)
Neutrophils Relative %: 37 % — ABNORMAL LOW (ref 43–77)
Platelets: 343 10*3/uL (ref 150–400)
RBC: 4.77 MIL/uL (ref 3.87–5.11)
RDW: 14.3 % (ref 11.5–15.5)
WBC: 5.5 10*3/uL (ref 4.0–10.5)

## 2015-07-11 LAB — HEMOGLOBIN A1C
Hgb A1c MFr Bld: 6.6 % — ABNORMAL HIGH (ref <5.7)
Mean Plasma Glucose: 143 mg/dL — ABNORMAL HIGH (ref <117)

## 2015-07-11 LAB — BASIC METABOLIC PANEL WITH GFR
BUN: 15 mg/dL (ref 7–25)
CHLORIDE: 100 mmol/L (ref 98–110)
CO2: 28 mmol/L (ref 20–31)
Calcium: 9.6 mg/dL (ref 8.6–10.4)
Creat: 0.86 mg/dL (ref 0.50–0.99)
GFR, EST AFRICAN AMERICAN: 83 mL/min (ref >=60)
GFR, EST NON AFRICAN AMERICAN: 72 mL/min (ref >=60)
GLUCOSE: 97 mg/dL (ref 65–99)
POTASSIUM: 4.2 mmol/L (ref 3.5–5.3)
Sodium: 139 mmol/L (ref 135–146)

## 2015-07-11 LAB — VITAMIN D 25 HYDROXY (VIT D DEFICIENCY, FRACTURES): Vit D, 25-Hydroxy: 40 ng/mL (ref 30–100)

## 2015-07-11 LAB — IRON AND TIBC
%SAT: 18 % — ABNORMAL LOW (ref 20–55)
Iron: 62 ug/dL (ref 42–145)
TIBC: 346 ug/dL (ref 250–470)
UIBC: 284 ug/dL (ref 125–400)

## 2015-07-11 LAB — LIPID PANEL
Cholesterol: 207 mg/dL — ABNORMAL HIGH (ref 125–200)
HDL: 84 mg/dL (ref >=46)
LDL Cholesterol: 109 mg/dL (ref <130)
Total CHOL/HDL Ratio: 2.5 Ratio (ref <=5.0)
Triglycerides: 69 mg/dL (ref <150)
VLDL: 14 mg/dL (ref <30)

## 2015-07-11 LAB — INSULIN, RANDOM: INSULIN: 13.1 u[IU]/mL (ref 2.0–19.6)

## 2015-07-11 LAB — HEPATIC FUNCTION PANEL
ALBUMIN: 4 g/dL (ref 3.6–5.1)
ALT: 20 U/L (ref 6–29)
AST: 23 U/L (ref 10–35)
Alkaline Phosphatase: 118 U/L (ref 33–130)
Bilirubin, Direct: 0.1 mg/dL (ref <=0.2)
Indirect Bilirubin: 0.4 mg/dL (ref 0.2–1.2)
TOTAL PROTEIN: 7.4 g/dL (ref 6.1–8.1)
Total Bilirubin: 0.5 mg/dL (ref 0.2–1.2)

## 2015-07-11 LAB — URINALYSIS, ROUTINE W REFLEX MICROSCOPIC
BILIRUBIN URINE: NEGATIVE
Glucose, UA: NEGATIVE
Hgb urine dipstick: NEGATIVE
KETONES UR: NEGATIVE
LEUKOCYTES UA: NEGATIVE
NITRITE: NEGATIVE
PROTEIN: NEGATIVE
SPECIFIC GRAVITY, URINE: 1.021 (ref 1.001–1.035)
pH: 7 (ref 5.0–8.0)

## 2015-07-11 LAB — MICROALBUMIN / CREATININE URINE RATIO
CREATININE, URINE: 150.6 mg/dL
MICROALB/CREAT RATIO: 1.3 mg/g (ref 0.0–30.0)
Microalb, Ur: 0.2 mg/dL (ref <2.0)

## 2015-07-11 LAB — TSH: TSH: 1.573 u[IU]/mL (ref 0.350–4.500)

## 2015-07-11 LAB — VITAMIN B12: VITAMIN B 12: 1511 pg/mL — AB (ref 211–911)

## 2015-07-11 LAB — MAGNESIUM: MAGNESIUM: 1.8 mg/dL (ref 1.5–2.5)

## 2015-07-23 ENCOUNTER — Other Ambulatory Visit: Payer: Self-pay | Admitting: *Deleted

## 2015-07-23 DIAGNOSIS — Z1212 Encounter for screening for malignant neoplasm of rectum: Secondary | ICD-10-CM

## 2015-07-23 LAB — POC HEMOCCULT BLD/STL (HOME/3-CARD/SCREEN)
Card #2 Fecal Occult Blod, POC: NEGATIVE
Card #3 Fecal Occult Blood, POC: NEGATIVE
Fecal Occult Blood, POC: NEGATIVE

## 2015-07-25 ENCOUNTER — Encounter: Payer: Self-pay | Admitting: Internal Medicine

## 2015-07-31 ENCOUNTER — Encounter: Payer: Self-pay | Admitting: Internal Medicine

## 2015-07-31 ENCOUNTER — Ambulatory Visit (INDEPENDENT_AMBULATORY_CARE_PROVIDER_SITE_OTHER): Payer: BLUE CROSS/BLUE SHIELD | Admitting: Internal Medicine

## 2015-07-31 VITALS — BP 124/70 | HR 64 | Temp 98.4°F | Resp 16 | Ht 62.5 in | Wt 191.2 lb

## 2015-07-31 DIAGNOSIS — Z23 Encounter for immunization: Secondary | ICD-10-CM

## 2015-07-31 DIAGNOSIS — E1310 Other specified diabetes mellitus with ketoacidosis without coma: Secondary | ICD-10-CM

## 2015-07-31 DIAGNOSIS — D2122 Benign neoplasm of connective and other soft tissue of left lower limb, including hip: Secondary | ICD-10-CM | POA: Diagnosis not present

## 2015-07-31 DIAGNOSIS — Z6834 Body mass index (BMI) 34.0-34.9, adult: Secondary | ICD-10-CM | POA: Diagnosis not present

## 2015-07-31 DIAGNOSIS — I10 Essential (primary) hypertension: Secondary | ICD-10-CM

## 2015-07-31 DIAGNOSIS — E1110 Type 2 diabetes mellitus with ketoacidosis without coma: Secondary | ICD-10-CM

## 2015-07-31 NOTE — Progress Notes (Signed)
Subjective:    Patient ID: Lauren Lopez, female    DOB: [birth year], 64 y.o.   MRN: [id number]  HPI  This very nice 64 yo BF with HTN, HLD, GERD& T2_DM presents fro recheck and systems review is neg wrt CV and diabetic sx's. She does c/o of a tender hard nodule of her left shin that is painful when inadvertently "bumped" or even with nl gait she fells an uncomfortable sensation.   Outpatient Prescriptions Prior to Visit  Medication Sig Dispense Refill  . ADVAIR HFA 115-21 MCG/ACT inhaler   6  . calcium carbonate (CALCIUM 600) 600 MG TABS tablet Take 600 mg by mouth daily.    . Cholecalciferol (VITAMIN D3) 5000 UNITS TABS Take 5,000 Units by mouth daily.    . cyanocobalamin 100 MCG tablet Take 100 mcg by mouth daily.    Marland Kitchen ENJUVIA 0.45 MG tablet TAKE 1 TABLET BY MOUTH EVERY DAY 90 tablet 2  . fexofenadine (ALLEGRA) 180 MG tablet Take 180 mg by mouth daily as needed.     Marland Kitchen glucose blood (FREESTYLE LITE) test strip Use as instructed 100 each 12  . hydrochlorothiazide (HYDRODIURIL) 25 MG tablet TAKE 1 TABLET BY MOUTH EVERY DAY 90 tablet 3  . MAGNESIUM PO Take 500 mg by mouth daily.     . RABEprazole (ACIPHEX) 20 MG tablet Take 1 tablet (20 mg total) by mouth daily. 90 tablet 1  . Red Yeast Rice Extract (RED YEAST RICE PO) Take 2,000 mg by mouth daily.     No facility-administered medications prior to visit.   Allergies  Allergen Reactions  . Metronidazole Other (See Comments)    unknown  . Ppd [Tuberculin Purified Protein Derivative] Other (See Comments)    + PPD 2013 with NEG CXR 05/2012   Past Medical History  Diagnosis Date  . Pneumonia 5/13  . Multiple allergies   . GERD (gastroesophageal reflux disease)   . Arthritis   . Bilateral carpal tunnel syndrome   . Wears glasses   . Wears dentures     upper  . Positive PPD   . Right rotator cuff tear 01/20/2013  . Hypertension   . Hyperlipidemia   . Unspecified vitamin D deficiency   . Asthma   . Anemia    Past Surgical  History  Procedure Laterality Date  . Bilateral salpingoophorectomy  11/28/2010    open laparoscopy with adhesiolysis also  . Knee arthroscopy  2005    rt  . Colonoscopy    . Carpal tunnel release  07/15/2012    Procedure: CARPAL TUNNEL RELEASE;  Surgeon: Cammie Sickle., MD;  Location: [facility];  Service: Orthopedics;  Laterality: Left;  . Shoulder arthroscopy with rotator cuff repair and subacromial decompression Right 01/20/2013    Procedure: RIGHT ARTHROSCOPY SHOULDER DEBRIDEMENT LIMITED, ARTHROSCOPY SHOULDER DECOMPRESSION SUBACROMIAL PARTIAL ACROMIOPLASTY WITH CORACOACROMIAL RELEASE, ROTATOR CUFF REPAIR ;  Surgeon: Johnny Bridge, MD;  Location: [facility];  Service: Orthopedics;  Laterality: Right;  RIGHT SHOULDER SCOPE DEBRIDEMENT, ACRIOMIOPLASTY, ROTATOR CUFF REPAIR  . Abdominal hysterectomy    . Carpal tunnel release Right 05/31/2014    Procedure: RIGHT CARPAL TUNNEL RELEASE;  Surgeon: Cammie Sickle, MD;  Location: [facility];  Service: Orthopedics;  Laterality: Right;   Review of Systems  10 point systems review negative except as above.    Objective:   Physical Exam  BP 124/70 mmHg  Pulse 64  Temp(Src) 98.4 F (36.9 C)  Resp 16  Ht 5' 2.5" (1.588 m)  Wt 191 lb 3.2 oz (86.728 kg)  BMI 34.39 kg/m2 HEENT - Eac's patent. TM's Nl. EOM's full. PERRLA. NasoOroPharynx clear. Neck - supple. Nl Thyroid. Carotids 2+ & No bruits, nodes, JVD Chest - Clear equal BS w/o Rales, rhonchi, wheezes. Cor - Nl HS. RRR w/o sig MGR. PP 1(+). No edema. Abd - No palpable organomegaly, masses or tenderness. BS nl. MS- FROM w/o deformities. Muscle power, tone and bulk Nl. Gait Nl. Neuro - No obvious Cr N abnormalities. Sensory, motor and Cerebellar functions appear Nl w/o focal abnormalities. Psyche - Mental status normal & appropriate.  No delusions, ideations or obvious mood abnormalities.  Skin- There is a tender firm approx 5 mm x 5 mm  nodule along the mid anterior left shin.  Procedure - 11400:  (Excision of benign appearing nodule - suspected fibroma)  After informed consent and aseptic prep with alcohol scrub the area was anesthetized with 0.5 ml of Marcaine 0.5% with epi infiltrated sub cut. Then with a #10 scalpel, a 6 mm transverse incision was carried down to a firm ~ 5 mm x 5 mm - then secures with a mosquito forcept while the firm nodule was sharply dissected free & delivered intact and appeared to be a shiny white firm nodule. The wound was irrigated with H2O2 and then closed wit #1 horizonal mattress suture of Nylon 4-0. Sterile gauze dressing was applied and secured. Patient was instructed in wound care & to return in 10-12 days for suture removal or call or return sooner if problems or questions.     Assessment & Plan:   1. Essential hypertension   2. Fibroma of left lower leg   3. Need for shingles vaccine  - Varicella-zoster vaccine subcutaneous  4. BMI 34.0-34.9,adult   5. T2_NIDDM

## 2015-08-12 ENCOUNTER — Ambulatory Visit: Payer: BLUE CROSS/BLUE SHIELD | Admitting: Internal Medicine

## 2015-08-12 ENCOUNTER — Encounter: Payer: Self-pay | Admitting: Internal Medicine

## 2015-08-12 VITALS — BP 114/74 | HR 64 | Temp 97.7°F | Resp 16 | Ht 62.5 in | Wt 190.6 lb

## 2015-08-12 DIAGNOSIS — D2122 Benign neoplasm of connective and other soft tissue of left lower limb, including hip: Secondary | ICD-10-CM

## 2015-08-12 NOTE — Progress Notes (Signed)
   Subjective:    Patient ID: Lauren Lopez, female    DOB: 04-Dec-1950, 64 y.o.   MRN: [id number]  HPI Patient returns for suture removal of the left shin post excision of a fibroma nodule.     Review of Systems     Objective:   Physical Exam  BP 114/74 mmHg  Pulse 64  Temp(Src) 97.7 F (36.5 C)  Resp 16  Ht 5' 2.5" (1.588 m)  Wt 190 lb 9.6 oz (86.456 kg)  BMI 34.28 kg/m2  Wound well healed w/o signs of infection and suture removed w/o difficulty.     Assessment & Plan:   1) s/p excision of fibroma, left shin  - ROV - prn

## 2015-10-03 ENCOUNTER — Ambulatory Visit (INDEPENDENT_AMBULATORY_CARE_PROVIDER_SITE_OTHER): Payer: BLUE CROSS/BLUE SHIELD | Admitting: Internal Medicine

## 2015-10-03 ENCOUNTER — Encounter: Payer: Self-pay | Admitting: Internal Medicine

## 2015-10-03 VITALS — BP 126/66 | HR 64 | Temp 97.2°F | Resp 16 | Ht 62.5 in | Wt 190.0 lb

## 2015-10-03 DIAGNOSIS — J069 Acute upper respiratory infection, unspecified: Secondary | ICD-10-CM

## 2015-10-03 MED ORDER — PREDNISONE 20 MG PO TABS: ORAL_TABLET | ORAL | Status: DC

## 2015-10-03 MED ORDER — AZITHROMYCIN 250 MG PO TABS: ORAL_TABLET | ORAL | Status: DC

## 2015-10-03 NOTE — Progress Notes (Signed)
HPI  Patient presents to the office for evaluation of cough.  It has been going on for 2 days.  Patient reports dry minimal cough.  They also endorse postnasal drip and sore throat, nosebleeds, congestion, right ear pain.  .  They have tried menthol.  They report that nothing has worked.  They denies other sick contacts.  ROS  PE:  General:  Alert and non-toxic, WDWN, NAD HEENT: NCAT, PERLA, EOM normal, no occular discharge or erythema.  Nasal mucosal edema with sinus tenderness to palpation.  Oropharynx clear with minimal oropharyngeal edema and erythema.  Mucous membranes moist and pink. Neck:  Cervical adenopathy Chest:  RRR no MRGs.  Lungs clear to auscultation A&P with no wheezes rhonchi or rales.   Abdomen: +BS x 4 quadrants, soft, non-tender, no guarding, rigidity, or rebound. Skin: warm and dry no rash Neuro: A&Ox4, CN II-XII grossly intact  Assessment and Plan:   1. Acute URI -prednisone -zpak -nasal saline -saline gel -avoid nasonex x 3 days due to nasal irritation

## 2015-10-03 NOTE — Patient Instructions (Signed)
Please take the prednisone until it is all the way gone.  Please use saline rinse in your sinuses.  Please use either saline gel or vaseline in your nose to help moisturize it.  Please take a 3 day break from your nasal spray.  Continue your allegra  If you are no better in 3 days go ahead and take the zpak.

## 2015-10-17 ENCOUNTER — Ambulatory Visit (INDEPENDENT_AMBULATORY_CARE_PROVIDER_SITE_OTHER): Payer: BLUE CROSS/BLUE SHIELD | Admitting: Internal Medicine

## 2015-10-17 ENCOUNTER — Encounter: Payer: Self-pay | Admitting: Internal Medicine

## 2015-10-17 VITALS — BP 120/72 | HR 66 | Temp 98.0°F | Resp 18 | Ht 62.5 in | Wt 188.0 lb

## 2015-10-17 DIAGNOSIS — E559 Vitamin D deficiency, unspecified: Secondary | ICD-10-CM

## 2015-10-17 DIAGNOSIS — Z79899 Other long term (current) drug therapy: Secondary | ICD-10-CM | POA: Diagnosis not present

## 2015-10-17 DIAGNOSIS — Z23 Encounter for immunization: Secondary | ICD-10-CM

## 2015-10-17 DIAGNOSIS — E782 Mixed hyperlipidemia: Secondary | ICD-10-CM | POA: Diagnosis not present

## 2015-10-17 DIAGNOSIS — E119 Type 2 diabetes mellitus without complications: Secondary | ICD-10-CM

## 2015-10-17 DIAGNOSIS — J45909 Unspecified asthma, uncomplicated: Secondary | ICD-10-CM | POA: Diagnosis not present

## 2015-10-17 MED ORDER — HYDROCHLOROTHIAZIDE 25 MG PO TABS: 25.0000 mg | ORAL_TABLET | Freq: Every day | ORAL | Status: DC

## 2015-10-17 MED ORDER — FEXOFENADINE HCL 180 MG PO TABS: 180.0000 mg | ORAL_TABLET | Freq: Every day | ORAL | Status: AC

## 2015-10-17 MED ORDER — RABEPRAZOLE SODIUM 20 MG PO TBEC: 20.0000 mg | DELAYED_RELEASE_TABLET | Freq: Every day | ORAL | Status: DC

## 2015-10-17 MED ORDER — FLUTICASONE-SALMETEROL 115-21 MCG/ACT IN AERO: 2.0000 | INHALATION_SPRAY | Freq: Two times a day (BID) | RESPIRATORY_TRACT | Status: DC

## 2015-10-17 NOTE — Progress Notes (Signed)
Patient ID: ORPAH MCMURDIE, female   DOB: May 01, 1951, 64 y.o.   MRN: [id number]  Assessment and Plan:  Hypertension:  -Continue medication,  -monitor blood pressure at home.  -Continue DASH diet.   -Reminder to go to the ER if any CP, SOB, nausea, dizziness, severe HA, changes vision/speech, left arm numbness and tingling, and jaw pain.  Cholesterol: -Continue diet and exercise.  -Check cholesterol.   Pre-diabetes: -Continue diet and exercise.  -Check A1C  Vitamin D Def: -check level -continue medications.   Need for influenza -updated today  Continue diet and meds as discussed. Further disposition pending results of labs.  HPI 64 y.o. female  presents for 3 month follow up with hypertension, hyperlipidemia, prediabetes and vitamin D.   Her blood pressure has been controlled at home, today their BP is BP: 120/72 mmHg.   She does workout. She denies chest pain, shortness of breath, dizziness.   She is on cholesterol medication and denies myalgias. Her cholesterol is at goal. The cholesterol last visit was:   Lab Results  Component Value Date   CHOL 207* 07/10/2015   HDL 84 07/10/2015   LDLCALC 109 07/10/2015   TRIG 69 07/10/2015   CHOLHDL 2.5 07/10/2015     She has been working on diet and exercise for prediabetes, and denies foot ulcerations, hyperglycemia, hypoglycemia , increased appetite, nausea, paresthesia of the feet, polydipsia, polyuria, visual disturbances, vomiting and weight loss. Last A1C in the office was:  Lab Results  Component Value Date   HGBA1C 6.6* 07/10/2015    Patient is on Vitamin D supplement.  Lab Results  Component Value Date   VD25OH 40 07/10/2015     She reports that she has gotten over her URI from couple weeks ago. She has a couple more prednisone left. She reports that she has not been drinking a lot of water.  She reports that she drinks one bottle of [employer] water.     Current Medications:  Current Outpatient  Prescriptions on File Prior to Visit  Medication Sig Dispense Refill  . ADVAIR HFA 115-21 MCG/ACT inhaler   6  . azithromycin (ZITHROMAX Z-PAK) 250 MG tablet 2 po day one, then 1 daily x 4 days 5 tablet 0  . calcium carbonate (CALCIUM 600) 600 MG TABS tablet Take 600 mg by mouth daily.    . Cholecalciferol (VITAMIN D3) 5000 UNITS TABS Take 5,000 Units by mouth daily.    . cyanocobalamin 100 MCG tablet Take 100 mcg by mouth daily.    Marland Kitchen ENJUVIA 0.45 MG tablet TAKE 1 TABLET BY MOUTH EVERY DAY 90 tablet 2  . fexofenadine (ALLEGRA) 180 MG tablet Take 180 mg by mouth daily as needed.     . hydrochlorothiazide (HYDRODIURIL) 25 MG tablet TAKE 1 TABLET BY MOUTH EVERY DAY 90 tablet 3  . MAGNESIUM PO Take 500 mg by mouth daily.     . predniSONE (DELTASONE) 20 MG tablet 3 tabs po daily x 3 days, then 2 tabs x 3 days, then 1.5 tabs x 3 days, then 1 tab x 3 days, then 0.5 tabs x 3 days 27 tablet 0  . RABEprazole (ACIPHEX) 20 MG tablet Take 1 tablet (20 mg total) by mouth daily. 90 tablet 1  . Red Yeast Rice Extract (RED YEAST RICE PO) Take 2,000 mg by mouth daily.     No current facility-administered medications on file prior to visit.    Medical History:  Past Medical History  Diagnosis Date  .  Pneumonia 5/13  . Multiple allergies   . GERD (gastroesophageal reflux disease)   . Arthritis   . Bilateral carpal tunnel syndrome   . Wears glasses   . Wears dentures     upper  . Positive PPD   . Right rotator cuff tear 01/20/2013  . Hypertension   . Hyperlipidemia   . Unspecified vitamin D deficiency   . Asthma   . Anemia     Allergies:  Allergies  Allergen Reactions  . Metronidazole Other (See Comments)    unknown  . Ppd [Tuberculin Purified Protein Derivative] Other (See Comments)    + PPD 2013 with NEG CXR 05/2012     Review of Systems:  Review of Systems  Constitutional: Negative for fever, chills and malaise/fatigue.  HENT: Negative for congestion, ear pain and sore throat.   Eyes:  Negative for blurred vision and double vision.  Respiratory: Negative for cough, hemoptysis and shortness of breath.   Cardiovascular: Negative for chest pain, palpitations and leg swelling.  Gastrointestinal: Negative for diarrhea, constipation, blood in stool and melena.  Genitourinary: Negative.   Neurological: Negative for dizziness, sensory change, loss of consciousness and headaches.  Psychiatric/Behavioral: Negative for depression. The patient is not nervous/anxious and does not have insomnia.     Family history- Review and unchanged  Social history- Review and unchanged  Physical Exam: BP 120/72 mmHg  Pulse 66  Temp(Src) 98 F (36.7 C) (Temporal)  Resp 18  Ht 5' 2.5" (1.588 m)  Wt 188 lb (85.276 kg)  BMI 33.82 kg/m2 Wt Readings from Last 3 Encounters:  10/17/15 188 lb (85.276 kg)  10/03/15 190 lb (86.183 kg)  08/12/15 190 lb 9.6 oz (86.456 kg)    General Appearance: Well nourished well developed, in no apparent distress. Eyes: PERRLA, EOMs, conjunctiva no swelling or erythema ENT/Mouth: Ear canals normal without obstruction, swelling, erythma, discharge.  TMs normal bilaterally.  Oropharynx moist, clear, without exudate, or postoropharyngeal swelling. Neck: Supple, thyroid normal,no cervical adenopathy  Respiratory: Respiratory effort normal, Breath sounds clear A&P without rhonchi, wheeze, or rale.  No retractions, no accessory usage. Cardio: RRR with no MRGs. Brisk peripheral pulses without edema.  Abdomen: Soft, + BS,  Non tender, no guarding, rebound, hernias, masses. Musculoskeletal: Full ROM, 5/5 strength, Normal gait Skin: Warm, dry without rashes, lesions, ecchymosis.  Neuro: Awake and oriented X 3, Cranial nerves intact. Normal muscle tone, no cerebellar symptoms. Psych: Normal affect, Insight and Judgment appropriate.    Starlyn Skeans, PA-C 4:37 PM [facility]

## 2015-10-18 LAB — BASIC METABOLIC PANEL WITH GFR
BUN: 13 mg/dL (ref 7–25)
CALCIUM: 9.7 mg/dL (ref 8.6–10.4)
CHLORIDE: 98 mmol/L (ref 98–110)
CO2: 30 mmol/L (ref 20–31)
CREATININE: 0.78 mg/dL (ref 0.50–0.99)
GFR, Est African American: >89 mL/min (ref >=60)
GFR, Est Non African American: 81 mL/min (ref >=60)
Glucose, Bld: 96 mg/dL (ref 65–99)
Potassium: 4.1 mmol/L (ref 3.5–5.3)
Sodium: 138 mmol/L (ref 135–146)

## 2015-10-18 LAB — HEPATIC FUNCTION PANEL
ALBUMIN: 4 g/dL (ref 3.6–5.1)
ALT: 19 U/L (ref 6–29)
AST: 20 U/L (ref 10–35)
Alkaline Phosphatase: 95 U/L (ref 33–130)
BILIRUBIN TOTAL: 0.4 mg/dL (ref 0.2–1.2)
Bilirubin, Direct: 0.1 mg/dL (ref <=0.2)
Indirect Bilirubin: 0.3 mg/dL (ref 0.2–1.2)
Total Protein: 7.1 g/dL (ref 6.1–8.1)

## 2015-10-18 LAB — CBC WITH DIFFERENTIAL/PLATELET
BASOS PCT: 0 % (ref 0–1)
Basophils Absolute: 0 10*3/uL (ref 0.0–0.1)
EOS PCT: 1 % (ref 0–5)
Eosinophils Absolute: 0.1 10*3/uL (ref 0.0–0.7)
HEMATOCRIT: 40 % (ref 36.0–46.0)
Hemoglobin: 12.4 g/dL (ref 12.0–15.0)
LYMPHS PCT: 32 % (ref 12–46)
Lymphs Abs: 3.4 10*3/uL (ref 0.7–4.0)
MCH: 25.8 pg — ABNORMAL LOW (ref 26.0–34.0)
MCHC: 31 g/dL (ref 30.0–36.0)
MCV: 83.3 fL (ref 78.0–100.0)
MONO ABS: 0.6 10*3/uL (ref 0.1–1.0)
MPV: 10.5 fL (ref 8.6–12.4)
Monocytes Relative: 6 % (ref 3–12)
Neutro Abs: 6.5 10*3/uL (ref 1.7–7.7)
Neutrophils Relative %: 61 % (ref 43–77)
PLATELETS: 374 10*3/uL (ref 150–400)
RBC: 4.8 MIL/uL (ref 3.87–5.11)
RDW: 15.1 % (ref 11.5–15.5)
WBC: 10.6 10*3/uL — AB (ref 4.0–10.5)

## 2015-10-18 LAB — HEMOGLOBIN A1C
HEMOGLOBIN A1C: 6.5 % — AB (ref <5.7)
Mean Plasma Glucose: 140 mg/dL — ABNORMAL HIGH (ref <117)

## 2015-10-20 ENCOUNTER — Encounter: Payer: Self-pay | Admitting: *Deleted

## 2015-11-20 ENCOUNTER — Ambulatory Visit (INDEPENDENT_AMBULATORY_CARE_PROVIDER_SITE_OTHER): Payer: BLUE CROSS/BLUE SHIELD | Admitting: Internal Medicine

## 2015-11-20 ENCOUNTER — Encounter: Payer: Self-pay | Admitting: Internal Medicine

## 2015-11-20 VITALS — BP 112/76 | HR 72 | Temp 97.5°F | Resp 16 | Ht 62.5 in | Wt 187.8 lb

## 2015-11-20 DIAGNOSIS — J0140 Acute pansinusitis, unspecified: Secondary | ICD-10-CM | POA: Diagnosis not present

## 2015-11-20 MED ORDER — PREDNISONE 20 MG PO TABS: ORAL_TABLET | ORAL | Status: DC

## 2015-11-20 MED ORDER — HYDROCODONE-ACETAMINOPHEN 5-325 MG PO TABS: ORAL_TABLET | ORAL | Status: AC

## 2015-11-20 MED ORDER — LEVOFLOXACIN 500 MG PO TABS: ORAL_TABLET | ORAL | Status: DC

## 2015-11-20 MED ORDER — PROMETHAZINE-DM 6.25-15 MG/5ML PO SYRP: ORAL_SOLUTION | ORAL | Status: DC

## 2015-11-20 NOTE — Progress Notes (Signed)
Subjective:    Patient ID: Lauren Lopez, female    DOB: 08-15-1951, 64 y.o.   MRN: [id number]  HPI  This very nice 64 yo DBF presents with 1-2 week prodrome of worsening head congestion despite taking a Z Pak ~ 1 week ago. C/o frontal HA's and frontal & maxillary pressure & pain and yellowish green nasal secretions. Has hoarseness w/o sig cough and denies any chest congestion. No fever, chills, rash or dyspnea.   Medication Sig  . calcium carbonate (CALCIUM 600)  Take 600 mg by mouth daily.  Marland Kitchen VITAMIN D 5000 UNITS  Take 5,000 Units by mouth daily.  . cyanocobalamin 100 MCG tablet Take 100 mcg by mouth daily.  Marland Kitchen ENJUVIA 0.45 MG tablet TAKE 1 TABLET BY MOUTH EVERY DAY  . fexofenadine  180 MG tablet Take 1 tablet (180 mg total) by mouth daily.  Marland Kitchen ADVAIR HFA 115-21  Inhale 2 puffs into the lungs 2 (two) times daily.  . hctz 25 MG tablet Take 1 tablet (25 mg total) by mouth daily.  Marland Kitchen MAGNESIUM PO Take 500 mg by mouth daily.   . RABEprazole (ACIPHEX) 20 MG tablet Take 1 tablet (20 mg total) by mouth daily.  . Red Yeast Rice Extract  Take 2,000 mg by mouth daily.   Allergies  Allergen Reactions  . Metronidazole Other (See Comments)    unknown  . Ppd [Tuberculin Purified Protein Derivative] Other (See Comments)    + PPD 2013 with NEG CXR 05/2012   Past Medical History  Diagnosis Date  . Pneumonia 5/13  . Multiple allergies   . GERD (gastroesophageal reflux disease)   . Arthritis   . Bilateral carpal tunnel syndrome   . Wears glasses   . Wears dentures     upper  . Positive PPD   . Right rotator cuff tear 01/20/2013  . Hypertension   . Hyperlipidemia   . Unspecified vitamin D deficiency   . Asthma   . Anemia    Review of Systems 10 point systems review negative except as above.    Objective:   Physical Exam  BP 112/76 mmHg  Pulse 72  Temp(Src) 97.5 F (36.4 C)  Resp 16  Ht 5' 2.5" (1.588 m)  Wt 187 lb 12.8 oz (85.186 kg)  BMI 33.78 kg/m2  Hoarse & nasal speech. No  Distress.  HEENT - Eac's patent. TM's Nl. EOM's full. PERRLA. NasoOroPharynx clear.(+) frontal & maxillary tenderness.  Neck - supple. Nl Thyroid. Carotids 2+ & No bruits, nodes, JVD Chest - Clear equal BS w/o Rales, rhonchi, wheezes. Cor - Nl HS. RRR w/o sig MGR. PP 1(+). No edema. Abd - No palpable organomegaly, masses or tenderness. BS nl. MS- FROM w/o deformities. Muscle power, tone and bulk Nl. Gait Nl. Neuro - No obvious Cr N abnormalities. Sensory, motor and Cerebellar functions appear Nl w/o focal abnormalities. Psyche - Mental status normal & appropriate.  No delusions, ideations or obvious mood abnormalities.    Assessment & Plan:   1. Subacute pansinusitis  - predniSONE (DELTASONE) 20 MG tablet; 1 tab 3 x day for 3 days, then 1 tab 2 x day for 3 days, then 1 tab 1 x day for 5 days  Dispense: 20 tablet; Refill: 0 - promethazine-dextromethorphan (PROMETHAZINE-DM) 6.25-15 MG/5ML syrup; Take 1 to 2 tsp enery 4 hours if needed for cough  Dispense: 360 mL; Refill: 1 - HYDROcodone-acetaminophen (NORCO) 5-325 MG tablet; Take 1/2 to 1 tablet every 4 hours if needed for cough  or pain  Dispense: 50 tablet; Refill: 0 - levofloxacin (LEVAQUIN) 500 MG tablet; Take 1 tablet daily with food for infection  Dispense: 15 tablet; Refill: 1  - discussed meds/SE's.  - ROV - prn

## 2015-11-20 NOTE — Patient Instructions (Signed)

## 2015-12-28 ENCOUNTER — Other Ambulatory Visit: Payer: Self-pay | Admitting: Internal Medicine

## 2015-12-28 DIAGNOSIS — I10 Essential (primary) hypertension: Secondary | ICD-10-CM

## 2016-02-04 ENCOUNTER — Ambulatory Visit (INDEPENDENT_AMBULATORY_CARE_PROVIDER_SITE_OTHER): Payer: Self-pay | Admitting: Physician Assistant

## 2016-02-04 ENCOUNTER — Encounter: Payer: Self-pay | Admitting: Physician Assistant

## 2016-02-04 VITALS — BP 124/80 | HR 69 | Temp 97.9°F | Resp 16 | Ht 62.5 in | Wt 199.8 lb

## 2016-02-04 DIAGNOSIS — E559 Vitamin D deficiency, unspecified: Secondary | ICD-10-CM

## 2016-02-04 DIAGNOSIS — E119 Type 2 diabetes mellitus without complications: Secondary | ICD-10-CM

## 2016-02-04 DIAGNOSIS — E782 Mixed hyperlipidemia: Secondary | ICD-10-CM

## 2016-02-04 DIAGNOSIS — Z79899 Other long term (current) drug therapy: Secondary | ICD-10-CM

## 2016-02-04 DIAGNOSIS — R350 Frequency of micturition: Secondary | ICD-10-CM

## 2016-02-04 DIAGNOSIS — I10 Essential (primary) hypertension: Secondary | ICD-10-CM

## 2016-02-04 LAB — BASIC METABOLIC PANEL WITH GFR
BUN: 13 mg/dL (ref 7–25)
CO2: 26 mmol/L (ref 20–31)
Calcium: 9.4 mg/dL (ref 8.6–10.4)
Chloride: 101 mmol/L (ref 98–110)
Creat: 0.84 mg/dL (ref 0.50–0.99)
GFR, EST AFRICAN AMERICAN: 85 mL/min (ref >=60)
GFR, EST NON AFRICAN AMERICAN: 74 mL/min (ref >=60)
Glucose, Bld: 89 mg/dL (ref 65–99)
POTASSIUM: 4.1 mmol/L (ref 3.5–5.3)
Sodium: 136 mmol/L (ref 135–146)

## 2016-02-04 NOTE — Patient Instructions (Addendum)
GETTING OFF OF PPI's    CAN TRY NEXIUM OR OMEPRAZOLE OVER THE COUNTER RATHER THAN THE ACIPHEX, CAN FIND CHEAPEST AT [facility], THESE are called PPI's, they are great at healing your stomach but should only be taken for a short period of time.     Recent studies have shown that taken for a long time they  can increase the risk of osteoporosis (weakening of your bones), pneumonia, low magnesium, restless legs, Cdiff (infection that causes diarrhea), DEMENTIA and most recently kidney damage / disease / insufficiency.     Due to this information we want to try to stop the PPI but if you try to stop it abruptly this can cause rebound acid and worsening symptoms.   So this is how we want you to get off the PPI:  - Start taking the nexium/protonix/prilosec/PPI  every other day with  zantac (ranitidine) 2 x a day for 2-4 weeks  - then decrease the PPI to every 3 days while taking the zantac (ranitidine) twice a day the other  days for 2-4  Weeks  - then you can try the zantac (ranitidine) once at night or up to 2 x day as needed.  - you can continue on this once at night or stop all together  - Avoid alcohol, spicy foods, NSAIDS (aleve, ibuprofen) at this time. See foods below.   +++++++++++++++++++++++++++++++++++++++++++  Food Choices for Gastroesophageal Reflux Disease  When you have gastroesophageal reflux disease (GERD), the foods you eat and your eating habits are very important. Choosing the right foods can help ease the discomfort of GERD. WHAT GENERAL GUIDELINES DO I NEED TO FOLLOW? 1. Choose fruits, vegetables, whole grains, low-fat dairy products, and low-fat meat, fish, and poultry. 2. Limit fats such as oils, salad dressings, butter, nuts, and avocado. 3. Keep a food diary to identify foods that cause symptoms. 4. Avoid foods that cause reflux. These may be different for different people. 5. Eat frequent small meals instead of three large meals each day. 6. Eat your meals  slowly, in a relaxed setting. 7. Limit fried foods. 8. Cook foods using methods other than frying. 9. Avoid drinking alcohol. 10. Avoid drinking large amounts of liquids with your meals. 11. Avoid bending over or lying down until 2-3 hours after eating. 12.  WHAT FOODS ARE NOT RECOMMENDED? The following are some foods and drinks that may worsen your symptoms:  Vegetables Tomatoes. Tomato juice. Tomato and spaghetti sauce. Chili peppers. Onion and garlic. Horseradish. Fruits Oranges, grapefruit, and lemon (fruit and juice). Meats High-fat meats, fish, and poultry. This includes hot dogs, ribs, ham, sausage, salami, and bacon. Dairy Whole milk and chocolate milk. Sour cream. Cream. Butter. Ice cream. Cream cheese.  Beverages Coffee and tea, with or without caffeine. Carbonated beverages or energy drinks. Condiments Hot sauce. Barbecue sauce.  Sweets/Desserts Chocolate and cocoa. Donuts. Peppermint and spearmint. Fats and Oils High-fat foods, including [place] fries and potato chips. Other Vinegar. Strong spices, such as black pepper, white pepper, red pepper, cayenne, curry powder, cloves, ginger, and chili powder. Nexium/protonix/prilosec are called PPI's, they are great at healing your stomach but should only be taken for a short period of time.   Diabetes is a very complicated disease...lets simplify it.  An easy way to look at it to understand the complications is if you think of the extra sugar floating in your blood stream as glass shards floating through your blood stream.    Diabetes affects your small vessels first: 1) The  glass shards (sugar) scraps down the tiny blood vessels in your eyes and lead to diabetic retinopathy, the leading cause of blindness in the [name]. Diabetes is the leading cause of newly diagnosed adult (43 to 65 years of age) blindness in the [place].  2) The glass shards scratches down the tiny vessels of your legs leading to nerve damage called  neuropathy and can lead to amputations of your feet. More than 60% of all non-traumatic amputations of lower limbs occur in people with diabetes.  3) Over time the small vessels in your brain are shredded and closed off, individually this does not cause any problems but over a long period of time many of the small vessels being blocked can lead to Vascular Dementia.   4) Your kidney's are a filter system and have a "net" that keeps certain things in the body and lets bad things out. Sugar shreds this net and leads to kidney damage and eventually failure. Decreasing the sugar that is destroying the net and certain blood pressure medications can help stop or decrease progression of kidney disease. Diabetes was the primary cause of kidney failure in 44 percent of all new cases in 2011.  5) Diabetes also destroys the small vessels in your penis that lead to erectile dysfunction. Eventually the vessels are so damaged that you may not be responsive to cialis or viagra.   Diabetes and your large vessels: Your larger vessels consist of your coronary arteries in your heart and the carotid vessels to your brain. Diabetes or even increased sugars put you at 300% increased risk of heart attack and stroke and this is why.. The sugar scrapes down your large blood vessels and your body sees this as an internal injury and tries to repair itself. Just like you get a scab on your skin, your platelets will stick to the blood vessel wall trying to heal it. This is why we have diabetics on low dose aspirin daily, this prevents the platelets from sticking and can prevent plaque formation. In addition, your body takes cholesterol and tries to shove it into the open wound. This is why we want your LDL, or bad cholesterol, below 70.   The combination of platelets and cholesterol over 5-10 years forms plaque that can break off and cause a heart attack or stroke.   PLEASE REMEMBER:  Diabetes is preventable! Up to 71 percent of  complications and morbidities among individuals with type 2 diabetes can be prevented, delayed, or effectively treated and minimized with regular visits to a health professional, appropriate monitoring and medication, and a healthy diet and lifestyle.     Bad carbs also include fruit juice, alcohol, and sweet tea. These are empty calories that do not signal to your brain that you are full.   Please remember the good carbs are still carbs which convert into sugar. So please measure them out no more than 1/2-1 cup of rice, oatmeal, pasta, and beans  Veggies are however free foods! Pile them on.   Not all fruit is created equal. Please see the list below, the fruit at the bottom is higher in sugars than the fruit at the top. Please avoid all dried fruits.

## 2016-02-04 NOTE — Progress Notes (Signed)
Patient ID: Lauren Lopez, female   DOB: [birth year], 65 y.o.   MRN: [id number]  Assessment and Plan:  Hypertension:  -Continue medication,  -monitor blood pressure at home.  -Continue DASH diet.   -Reminder to go to the ER if any CP, SOB, nausea, dizziness, severe HA, changes vision/speech, left arm numbness and tingling, and jaw pain.  Cholesterol: -Continue diet and exercise.  -Check cholesterol.   diabetes: -Continue diet and exercise.  -Check A1C  Vitamin D Def: -check level -continue medications.    Continue diet and meds as discussed. Further disposition pending results of labs.  HPI 65 y.o. female  presents for 3 month follow up with hypertension, hyperlipidemia, prediabetes and vitamin D.   Patient is retired on Feb 8th, she gets up and walks for one hour every morning.    Her blood pressure has been controlled at home, today their BP is BP: 124/80 mmHg.   She does workout. She denies chest pain, shortness of breath, dizziness.   She is on cholesterol medication and denies myalgias. Her cholesterol is at goal. The cholesterol last visit was:   Lab Results  Component Value Date   CHOL 207* 07/10/2015   HDL 84 07/10/2015   LDLCALC 109 07/10/2015   TRIG 69 07/10/2015   CHOLHDL 2.5 07/10/2015     She has been working on diet and exercise for diabetes, and denies foot ulcerations, hyperglycemia, hypoglycemia , increased appetite, nausea, paresthesia of the feet, polydipsia, polyuria, visual disturbances, vomiting and weight loss. Last A1C in the office was:  Lab Results  Component Value Date   HGBA1C 6.5* 10/17/2015   Patient is on Vitamin D supplement.  Lab Results  Component Value Date   VD25OH 40 07/10/2015     BMI is Body mass index is 35.94 kg/(m^2)., she is working on diet and exercise. Wt Readings from Last 3 Encounters:  02/04/16 199 lb 12.8 oz (90.629 kg)  11/20/15 187 lb 12.8 oz (85.186 kg)  10/17/15 188 lb (85.276 kg)    Current Medications:   Current Outpatient Prescriptions on File Prior to Visit  Medication Sig Dispense Refill  . calcium carbonate (CALCIUM 600) 600 MG TABS tablet Take 600 mg by mouth daily.    . Cholecalciferol (VITAMIN D3) 5000 UNITS TABS Take 5,000 Units by mouth daily.    . cyanocobalamin 100 MCG tablet Take 100 mcg by mouth daily.    Marland Kitchen ENJUVIA 0.45 MG tablet TAKE 1 TABLET BY MOUTH EVERY DAY 90 tablet 2  . fexofenadine (ALLEGRA) 180 MG tablet Take 1 tablet (180 mg total) by mouth daily. 90 tablet 6  . fluticasone-salmeterol (ADVAIR HFA) 115-21 MCG/ACT inhaler Inhale 2 puffs into the lungs 2 (two) times daily. 1 Inhaler 6  . hydrochlorothiazide (HYDRODIURIL) 25 MG tablet TAKE 1 TABLET BY MOUTH EVERY DAY 90 tablet 0  . MAGNESIUM PO Take 500 mg by mouth daily.     . RABEprazole (ACIPHEX) 20 MG tablet Take 1 tablet (20 mg total) by mouth daily. 90 tablet 3  . Red Yeast Rice Extract (RED YEAST RICE PO) Take 2,000 mg by mouth daily.     No current facility-administered medications on file prior to visit.    Medical History:  Past Medical History  Diagnosis Date  . Pneumonia 5/13  . Multiple allergies   . GERD (gastroesophageal reflux disease)   . Arthritis   . Bilateral carpal tunnel syndrome   . Wears glasses   . Wears dentures     upper  .  Positive PPD   . Right rotator cuff tear 01/20/2013  . Hypertension   . Hyperlipidemia   . Unspecified vitamin D deficiency   . Asthma   . Anemia     Allergies:  Allergies  Allergen Reactions  . Metronidazole Other (See Comments)    unknown  . Ppd [Tuberculin Purified Protein Derivative] Other (See Comments)    + PPD 2013 with NEG CXR 05/2012     Review of Systems:  Review of Systems  Constitutional: Negative for fever, chills and malaise/fatigue.  HENT: Negative for congestion, ear pain and sore throat.   Eyes: Negative for blurred vision and double vision.  Respiratory: Negative for cough, hemoptysis and shortness of breath.   Cardiovascular:  Negative for chest pain, palpitations and leg swelling.  Gastrointestinal: Negative for diarrhea, constipation, blood in stool and melena.  Genitourinary: Negative.   Neurological: Negative for dizziness, sensory change, loss of consciousness and headaches.  Psychiatric/Behavioral: Negative for depression. The patient is not nervous/anxious and does not have insomnia.     Family history- Review and unchanged  Social history- Review and unchanged  Physical Exam: BP 124/80 mmHg  Pulse 69  Temp(Src) 97.9 F (36.6 C) (Temporal)  Resp 16  Ht 5' 2.5" (1.588 m)  Wt 199 lb 12.8 oz (90.629 kg)  BMI 35.94 kg/m2  SpO2 98% Wt Readings from Last 3 Encounters:  02/04/16 199 lb 12.8 oz (90.629 kg)  11/20/15 187 lb 12.8 oz (85.186 kg)  10/17/15 188 lb (85.276 kg)   General Appearance: Well nourished well developed, in no apparent distress. Eyes: PERRLA, EOMs, conjunctiva no swelling or erythema ENT/Mouth: Ear canals normal without obstruction, swelling, erythma, discharge.  TMs normal bilaterally.  Oropharynx moist, clear, without exudate, or postoropharyngeal swelling. Neck: Supple, thyroid normal,no cervical adenopathy  Respiratory: Respiratory effort normal, Breath sounds clear A&P without rhonchi, wheeze, or rale.  No retractions, no accessory usage. Cardio: RRR with no MRGs. Brisk peripheral pulses without edema.  Abdomen: Soft, + BS,  Non tender, no guarding, rebound, hernias, masses. Musculoskeletal: Full ROM, 5/5 strength, Normal gait Skin: Warm, dry without rashes, lesions, ecchymosis.  Neuro: Awake and oriented X 3, Cranial nerves intact. Normal muscle tone, no cerebellar symptoms. Psych: Normal affect, Insight and Judgment appropriate.    Vicie Mutters, PA-C 10:17 AM [facility]

## 2016-02-05 LAB — URINALYSIS, ROUTINE W REFLEX MICROSCOPIC
BILIRUBIN URINE: NEGATIVE
GLUCOSE, UA: NEGATIVE
Hgb urine dipstick: NEGATIVE
Ketones, ur: NEGATIVE
LEUKOCYTES UA: NEGATIVE
Nitrite: NEGATIVE
PH: 5.5 (ref 5.0–8.0)
Protein, ur: NEGATIVE
SPECIFIC GRAVITY, URINE: 1.013 (ref 1.001–1.035)

## 2016-02-05 LAB — HEMOGLOBIN A1C
HEMOGLOBIN A1C: 6.3 % — AB (ref <5.7)
Mean Plasma Glucose: 134 mg/dL — ABNORMAL HIGH (ref <117)

## 2016-02-06 LAB — URINE CULTURE
Colony Count: NO GROWTH
Organism ID, Bacteria: NO GROWTH

## 2016-04-03 ENCOUNTER — Observation Stay (HOSPITAL_COMMUNITY)
Admission: EM | Admit: 2016-04-03 | Discharge: 2016-04-05 | Disposition: A | Discharge status: Home / Self Care | Payer: Self-pay | Attending: Internal Medicine | Admitting: Internal Medicine

## 2016-04-03 ENCOUNTER — Encounter (HOSPITAL_COMMUNITY): Payer: Self-pay | Admitting: Family Medicine

## 2016-04-03 ENCOUNTER — Emergency Department (HOSPITAL_COMMUNITY): Payer: Self-pay

## 2016-04-03 DIAGNOSIS — I776 Arteritis, unspecified: Secondary | ICD-10-CM | POA: Insufficient documentation

## 2016-04-03 DIAGNOSIS — K559 Vascular disorder of intestine, unspecified: Secondary | ICD-10-CM

## 2016-04-03 DIAGNOSIS — M549 Dorsalgia, unspecified: Secondary | ICD-10-CM | POA: Diagnosis present

## 2016-04-03 DIAGNOSIS — K55059 Acute (reversible) ischemia of intestine, part and extent unspecified: Secondary | ICD-10-CM | POA: Insufficient documentation

## 2016-04-03 DIAGNOSIS — I10 Essential (primary) hypertension: Secondary | ICD-10-CM | POA: Insufficient documentation

## 2016-04-03 DIAGNOSIS — R079 Chest pain, unspecified: Secondary | ICD-10-CM | POA: Diagnosis present

## 2016-04-03 DIAGNOSIS — E785 Hyperlipidemia, unspecified: Secondary | ICD-10-CM | POA: Insufficient documentation

## 2016-04-03 DIAGNOSIS — R0789 Other chest pain: Principal | ICD-10-CM | POA: Insufficient documentation

## 2016-04-03 DIAGNOSIS — K219 Gastro-esophageal reflux disease without esophagitis: Secondary | ICD-10-CM | POA: Insufficient documentation

## 2016-04-03 DIAGNOSIS — E119 Type 2 diabetes mellitus without complications: Secondary | ICD-10-CM | POA: Insufficient documentation

## 2016-04-03 DIAGNOSIS — J45909 Unspecified asthma, uncomplicated: Secondary | ICD-10-CM | POA: Diagnosis present

## 2016-04-03 HISTORY — DX: Vitamin deficiency, unspecified: E56.9

## 2016-04-03 HISTORY — DX: Type 2 diabetes mellitus without complications: E11.9

## 2016-04-03 HISTORY — DX: Low back pain: M54.5

## 2016-04-03 HISTORY — DX: Other chronic pain: G89.29

## 2016-04-03 HISTORY — DX: Arteritis, unspecified: I77.6

## 2016-04-03 HISTORY — DX: Low back pain, unspecified: M54.50

## 2016-04-03 LAB — BASIC METABOLIC PANEL
ANION GAP: 14 (ref 5–15)
BUN: 12 mg/dL (ref 6–20)
CO2: 22 mmol/L (ref 22–32)
Calcium: 9.8 mg/dL (ref 8.9–10.3)
Chloride: 104 mmol/L (ref 101–111)
Creatinine, Ser: 0.84 mg/dL (ref 0.44–1.00)
GFR calc Af Amer: >60 mL/min (ref >60)
GFR calc non Af Amer: >60 mL/min (ref >60)
GLUCOSE: 117 mg/dL — AB (ref 65–99)
POTASSIUM: 3.3 mmol/L — AB (ref 3.5–5.1)
Sodium: 140 mmol/L (ref 135–145)

## 2016-04-03 LAB — TROPONIN I
Troponin I: <0.03 ng/mL (ref <0.031)
Troponin I: <0.03 ng/mL (ref <0.031)

## 2016-04-03 LAB — C-REACTIVE PROTEIN: CRP: 1.2 mg/dL — AB (ref <1.0)

## 2016-04-03 LAB — CBC
HEMATOCRIT: 43.1 % (ref 36.0–46.0)
HEMOGLOBIN: 13.5 g/dL (ref 12.0–15.0)
MCH: 26.3 pg (ref 26.0–34.0)
MCHC: 31.3 g/dL (ref 30.0–36.0)
MCV: 84 fL (ref 78.0–100.0)
Platelets: 284 10*3/uL (ref 150–400)
RBC: 5.13 MIL/uL — ABNORMAL HIGH (ref 3.87–5.11)
RDW: 14.3 % (ref 11.5–15.5)
WBC: 8.3 10*3/uL (ref 4.0–10.5)

## 2016-04-03 LAB — I-STAT TROPONIN, ED: Troponin i, poc: 0 ng/mL (ref 0.00–0.08)

## 2016-04-03 LAB — LIPASE, BLOOD: Lipase: 27 U/L (ref 11–51)

## 2016-04-03 LAB — SEDIMENTATION RATE: SED RATE: 18 mm/h (ref 0–22)

## 2016-04-03 LAB — HEPATIC FUNCTION PANEL
ALT: 21 U/L (ref 14–54)
AST: 29 U/L (ref 15–41)
Albumin: 4 g/dL (ref 3.5–5.0)
Alkaline Phosphatase: 94 U/L (ref 38–126)
BILIRUBIN DIRECT: 0.1 mg/dL (ref 0.1–0.5)
BILIRUBIN TOTAL: 0.9 mg/dL (ref 0.3–1.2)
Indirect Bilirubin: 0.8 mg/dL (ref 0.3–0.9)
Total Protein: 7.6 g/dL (ref 6.5–8.1)

## 2016-04-03 MED ORDER — GI COCKTAIL ~~LOC~~: 30.0000 mL | Freq: Four times a day (QID) | ORAL | Status: DC | PRN

## 2016-04-03 MED ORDER — MAGNESIUM 200 MG PO TABS
500.0000 mg | ORAL_TABLET | Freq: Every day | ORAL | Status: DC
  Filled 2016-04-03: qty 2
  Filled 2016-04-03: qty 3

## 2016-04-03 MED ORDER — LORATADINE 10 MG PO TABS
10.0000 mg | ORAL_TABLET | Freq: Every day | ORAL | Status: DC
  Administered 2016-04-03 – 2016-04-05 (×3): 10 mg via ORAL
  Filled 2016-04-03 (×3): qty 1

## 2016-04-03 MED ORDER — IOPAMIDOL (ISOVUE-370) INJECTION 76%
INTRAVENOUS | Status: AC
  Administered 2016-04-03: 100 mL
  Filled 2016-04-03: qty 100

## 2016-04-03 MED ORDER — MOMETASONE FURO-FORMOTEROL FUM 200-5 MCG/ACT IN AERO
2.0000 | INHALATION_SPRAY | Freq: Two times a day (BID) | RESPIRATORY_TRACT | Status: DC
  Administered 2016-04-04 – 2016-04-05 (×3): 2 via RESPIRATORY_TRACT
  Filled 2016-04-03 (×2): qty 8.8

## 2016-04-03 MED ORDER — HEPARIN SODIUM (PORCINE) 5000 UNIT/ML IJ SOLN
5000.0000 [IU] | Freq: Three times a day (TID) | INTRAMUSCULAR | Status: DC
  Administered 2016-04-03 – 2016-04-05 (×5): 5000 [IU] via SUBCUTANEOUS
  Filled 2016-04-03 (×4): qty 1

## 2016-04-03 MED ORDER — ONDANSETRON HCL 4 MG/2ML IJ SOLN: 4.0000 mg | Freq: Four times a day (QID) | INTRAMUSCULAR | Status: DC | PRN

## 2016-04-03 MED ORDER — SODIUM CHLORIDE 0.9 % IV SOLN
INTRAVENOUS | Status: AC
  Administered 2016-04-03: 1000 mL via INTRAVENOUS

## 2016-04-03 MED ORDER — ACETAMINOPHEN 325 MG PO TABS
650.0000 mg | ORAL_TABLET | ORAL | Status: DC | PRN
Start: 2016-04-03 — End: 2016-04-03

## 2016-04-03 MED ORDER — MAGNESIUM OXIDE 400 (241.3 MG) MG PO TABS
400.0000 mg | ORAL_TABLET | Freq: Every day | ORAL | Status: DC
  Administered 2016-04-03 – 2016-04-05 (×3): 400 mg via ORAL
  Filled 2016-04-03 (×2): qty 1

## 2016-04-03 MED ORDER — MORPHINE SULFATE (PF) 2 MG/ML IV SOLN: 1.0000 mg | INTRAVENOUS | Status: DC | PRN

## 2016-04-03 MED ORDER — PANTOPRAZOLE SODIUM 40 MG PO TBEC
40.0000 mg | DELAYED_RELEASE_TABLET | Freq: Every day | ORAL | Status: DC
  Administered 2016-04-04 – 2016-04-05 (×2): 40 mg via ORAL
  Filled 2016-04-03 (×2): qty 1

## 2016-04-03 MED ORDER — HYDROCHLOROTHIAZIDE 25 MG PO TABS
25.0000 mg | ORAL_TABLET | Freq: Every day | ORAL | Status: DC
  Administered 2016-04-04 – 2016-04-05 (×2): 25 mg via ORAL
  Filled 2016-04-03 (×2): qty 1

## 2016-04-03 MED ORDER — GI COCKTAIL ~~LOC~~
30.0000 mL | Freq: Once | ORAL | Status: AC
Start: 2016-04-03 — End: 2016-04-03
  Administered 2016-04-03: 30 mL via ORAL
  Filled 2016-04-03: qty 30

## 2016-04-03 MED ORDER — ASPIRIN 81 MG PO CHEW
324.0000 mg | CHEWABLE_TABLET | Freq: Once | ORAL | Status: AC
  Administered 2016-04-03: 324 mg via ORAL
  Filled 2016-04-03: qty 4

## 2016-04-03 MED ORDER — SODIUM CHLORIDE 0.9 % IV SOLN
INTRAVENOUS | Status: DC
  Administered 2016-04-04 (×2): via INTRAVENOUS

## 2016-04-03 MED ORDER — CALCIUM CARBONATE 600 MG PO TABS
600.0000 mg | ORAL_TABLET | Freq: Every day | ORAL | Status: DC
  Filled 2016-04-03: qty 1

## 2016-04-03 MED ORDER — CALCIUM CARBONATE 1250 (500 CA) MG PO TABS
1.0000 | ORAL_TABLET | Freq: Every day | ORAL | Status: DC
  Administered 2016-04-03 – 2016-04-05 (×3): 500 mg via ORAL
  Filled 2016-04-03 (×3): qty 1

## 2016-04-03 MED ORDER — ASPIRIN EC 81 MG PO TBEC
81.0000 mg | DELAYED_RELEASE_TABLET | Freq: Every day | ORAL | Status: DC
  Administered 2016-04-04 – 2016-04-05 (×2): 81 mg via ORAL
  Filled 2016-04-03 (×2): qty 1

## 2016-04-03 MED ORDER — METHYLPREDNISOLONE SODIUM SUCC 125 MG IJ SOLR
80.0000 mg | Freq: Once | INTRAMUSCULAR | Status: AC
  Administered 2016-04-03: 80 mg via INTRAVENOUS
  Filled 2016-04-03: qty 2

## 2016-04-03 MED ORDER — SODIUM CHLORIDE 0.9 % IV SOLN
INTRAVENOUS | Status: DC
  Administered 2016-04-03: 18:00:00 via INTRAVENOUS

## 2016-04-03 MED ORDER — VITAMIN B-12 100 MCG PO TABS
100.0000 ug | ORAL_TABLET | Freq: Every day | ORAL | Status: DC
  Administered 2016-04-04 – 2016-04-05 (×2): 100 ug via ORAL
  Filled 2016-04-03 (×4): qty 1

## 2016-04-03 MED ORDER — ACETAMINOPHEN 325 MG PO TABS
650.0000 mg | ORAL_TABLET | ORAL | Status: DC | PRN
  Administered 2016-04-03 – 2016-04-04 (×2): 650 mg via ORAL
  Filled 2016-04-03 (×3): qty 2

## 2016-04-03 MED ORDER — VITAMIN D3 25 MCG (1000 UNIT) PO TABS
5000.0000 [IU] | ORAL_TABLET | Freq: Every day | ORAL | Status: DC
  Administered 2016-04-03 – 2016-04-05 (×3): 5000 [IU] via ORAL
  Filled 2016-04-03 (×7): qty 5

## 2016-04-03 NOTE — ED Notes (Addendum)
Pt here for chest pain that started about 12 this am. sts she though it was acid reflux and took meds without relief. Pt SOB. Sts the pain radiated in to her back

## 2016-04-03 NOTE — ED Provider Notes (Signed)
CSN: [id number]     Arrival date & time 04/03/16  0935 History   First MD Initiated Contact with Patient 04/03/16 1010     Chief Complaint  Patient presents with  . Chest Pain     (Consider location/radiation/quality/duration/timing/severity/associated sxs/prior Treatment) HPI Comments: Patient presents with central chest pain onset about 12 AM this morning. Radiates through to her back. It does not feel like her acid reflux and she took her stomach medication without relief. She had an episode of this pain yesterday afternoon while at the beauty salon and lasted about 30 minutes and has since resolved. The pain is been constant since 12:00 and is associated with shortness of breath and nausea but no vomiting. Nothing makes the pain better or worse. It radiates to her back. She's not had this pain previously. Denies any liver problems or pancreas problems. Denies any cardiac history. He is never had a heart attack. No stents. States history of acid reflux but this feels different. She still has a gallbladder.  The history is provided by the patient and a relative. The history is limited by the condition of the patient.    Past Medical History  Diagnosis Date  . Pneumonia 5/13  . Multiple allergies   . GERD (gastroesophageal reflux disease)   . Arthritis   . Bilateral carpal tunnel syndrome   . Wears glasses   . Wears dentures     upper  . Positive PPD   . Right rotator cuff tear 01/20/2013  . Hypertension   . Hyperlipidemia   . Unspecified vitamin D deficiency   . Asthma   . Anemia    Past Surgical History  Procedure Laterality Date  . Bilateral salpingoophorectomy  11/28/2010    open laparoscopy with adhesiolysis also  . Knee arthroscopy  2005    rt  . Colonoscopy    . Carpal tunnel release  07/15/2012    Procedure: CARPAL TUNNEL RELEASE;  Surgeon: Cammie Sickle., MD;  Location: [facility];  Service: Orthopedics;  Laterality: Left;  . Shoulder arthroscopy  with rotator cuff repair and subacromial decompression Right 01/20/2013    Procedure: RIGHT ARTHROSCOPY SHOULDER DEBRIDEMENT LIMITED, ARTHROSCOPY SHOULDER DECOMPRESSION SUBACROMIAL PARTIAL ACROMIOPLASTY WITH CORACOACROMIAL RELEASE, ROTATOR CUFF REPAIR ;  Surgeon: Johnny Bridge, MD;  Location: [place];  Service: Orthopedics;  Laterality: Right;  RIGHT SHOULDER SCOPE DEBRIDEMENT, ACRIOMIOPLASTY, ROTATOR CUFF REPAIR  . Abdominal hysterectomy    . Carpal tunnel release Right 05/31/2014    Procedure: RIGHT CARPAL TUNNEL RELEASE;  Surgeon: Cammie Sickle, MD;  Location: [place];  Service: Orthopedics;  Laterality: Right;   Family History  Problem Relation Age of Onset  . Hypertension Mother   . Diabetes Mother   . Stroke Mother   . Hypertension Father   . Heart disease Brother   . Hyperlipidemia Brother   . Diabetes Brother    Social History  Substance Use Topics  . Smoking status: Never Smoker   . Smokeless tobacco: None  . Alcohol Use: No   OB History    No data available     Review of Systems  Constitutional: Positive for activity change, appetite change and fatigue. Negative for fever.  HENT: Negative for congestion and nosebleeds.   Eyes: Negative for visual disturbance.  Respiratory: Positive for chest tightness and shortness of breath.   Cardiovascular: Positive for chest pain.  Gastrointestinal: Positive for nausea and abdominal pain. Negative for vomiting.  Genitourinary:  Negative for dysuria, hematuria, vaginal bleeding and vaginal discharge.  Musculoskeletal: Positive for back pain. Negative for myalgias and arthralgias.  Skin: Negative for rash.  Neurological: Negative for dizziness, weakness, light-headedness and headaches.  A complete 10 system review of systems was obtained and all systems are negative except as noted in the HPI and PMH.      Allergies  Metronidazole and Ppd  Home Medications   Prior to Admission  medications   Medication Sig Start Date End Date Taking? Authorizing Provider  calcium carbonate (CALCIUM 600) 600 MG TABS tablet Take 600 mg by mouth daily.   Yes Historical Provider, MD  Cholecalciferol (VITAMIN D3) 5000 UNITS TABS Take 5,000 Units by mouth daily.   Yes Historical Provider, MD  cyanocobalamin 100 MCG tablet Take 100 mcg by mouth daily.   Yes Historical Provider, MD  fexofenadine (ALLEGRA) 180 MG tablet Take 1 tablet (180 mg total) by mouth daily. 10/17/15  Yes Courtney Forcucci, PA-C  fluticasone-salmeterol (ADVAIR HFA) 115-21 MCG/ACT inhaler Inhale 2 puffs into the lungs 2 (two) times daily. 10/17/15  Yes Courtney Forcucci, PA-C  hydrochlorothiazide (HYDRODIURIL) 25 MG tablet TAKE 1 TABLET BY MOUTH EVERY DAY 12/28/15  Yes Unk Pinto, MD  MAGNESIUM PO Take 500 mg by mouth daily.    Yes Historical Provider, MD  RABEprazole (ACIPHEX) 20 MG tablet Take 1 tablet (20 mg total) by mouth daily. 10/17/15  Yes Courtney Forcucci, PA-C  Red Yeast Rice Extract (RED YEAST RICE PO) Take 2,000 mg by mouth daily.   Yes Historical Provider, MD   BP 149/75 mmHg  Pulse 71  Temp(Src) 97.8 F (36.6 C)  Resp 20  SpO2 100% Physical Exam  Constitutional: She is oriented to person, place, and time. She appears well-developed and well-nourished. She appears distressed.  Uncomfortable, diaphoretic  HENT:  Head: Normocephalic and atraumatic.  Mouth/Throat: Oropharynx is clear and moist. No oropharyngeal exudate.  Eyes: Conjunctivae and EOM are normal. Pupils are equal, round, and reactive to light.  Neck: Normal range of motion. Neck supple.  No meningismus.  Cardiovascular: Normal rate, regular rhythm, normal heart sounds and intact distal pulses.   No murmur heard. Pulmonary/Chest: Effort normal and breath sounds normal. No respiratory distress. She exhibits no tenderness.  Abdominal: Soft. There is no tenderness. There is no rebound and no guarding.  No epigastric or RUQ tenderness   Musculoskeletal: Normal range of motion. She exhibits no edema or tenderness.  Distal radial pulses intact. Equal grip strengths  Neurological: She is alert and oriented to person, place, and time. No cranial nerve deficit. She exhibits normal muscle tone. Coordination normal.  No ataxia on finger to nose bilaterally. No pronator drift. 5/5 strength throughout. CN 2-12 intact.Equal grip strength. Sensation intact.   Skin: Skin is warm. She is diaphoretic.  Psychiatric: She has a normal mood and affect. Her behavior is normal.  Nursing note and vitals reviewed.   ED Course  Procedures (including critical care time) Labs Review Labs Reviewed  BASIC METABOLIC PANEL - Abnormal; Notable for the following:    Potassium 3.3 (*)    Glucose, Bld 117 (*)    All other components within normal limits  CBC - Abnormal; Notable for the following:    RBC 5.13 (*)    All other components within normal limits  HEPATIC FUNCTION PANEL  LIPASE, BLOOD  I-STAT TROPOININ, ED  I-STAT CHEM 8, ED    Imaging Review Dg Chest 2 View  04/03/2016  CLINICAL DATA:  Chest pain, shortness  of breath. EXAM: CHEST  2 VIEW COMPARISON:  March 05, 2015. FINDINGS: The heart size and mediastinal contours are within normal limits. Both lungs are clear. No pneumothorax or pleural effusion is noted. The visualized skeletal structures are unremarkable. IMPRESSION: No active cardiopulmonary disease. Electronically Signed   By: Marijo Conception, M.D.   On: 04/03/2016 10:24   Ct Angio Chest/abd/pel For Dissection W And/or W/wo  04/03/2016  CLINICAL DATA:  Chest pain beginning around midnight radiating to the back. Evaluate for dissection. EXAM: CT ANGIOGRAPHY CHEST, ABDOMEN AND PELVIS TECHNIQUE: Multidetector CT imaging through the chest, abdomen and pelvis was performed using the standard protocol during bolus administration of intravenous contrast. Multiplanar reconstructed images and MIPs were obtained and reviewed to evaluate the  vascular anatomy. CONTRAST:  100 cc Isovue 370 COMPARISON:  Chest radiograph - 04/03/2016 FINDINGS: Vascular Findings of the chest: No evidence of thoracic aortic aneurysm or dissection with measurements as follows. Review of the precontrast images are negative for the presence of an intramural hematoma. Normal heart size.  No pericardial effusion. Although this examination was not tailored for the evaluation the pulmonary arteries, there are no discrete filling defects within the central pulmonary arterial tree to suggest central pulmonary embolism. Normal caliber the main pulmonary artery. ------------------------------------------------------------- Thoracic aortic measurements: Sinotubular junction 27 mm as measured in greatest oblique coronal dimension. Proximal ascending aorta 28 mm as measured in greatest oblique axial dimension at the level of the main pulmonary artery (axial mage 45, series 4) and approximately 29 mm in greatest oblique coronal diameter (coronal image 82, series 7). Aortic arch aorta 25 mm as measured in greatest oblique sagittal dimension. Proximal descending thoracic aorta 21 mm as measured in greatest oblique axial dimension at the level of the main pulmonary artery. Distal descending thoracic aorta 21 mm as measured in greatest oblique axial dimension at the level of the diaphragmatic hiatus. Review of the MIP images confirms the above findings. ------------------------------------------------------------- Non-Vascular Findings of the chest: Evaluation the pulmonary parenchyma is degraded secondary to patient respiratory artifact. There is rather extensive subpleural ground-glass atelectasis. No discrete focal airspace opacities. No pleural effusion or pneumothorax. The central pulmonary airways are widely patent. No discrete pulmonary nodules given limitation of the examination. No bulky mediastinal or hilar adenopathy. Bilateral axillary lymph nodes are prominent though symmetrically  sized with index left axillary lymph node measuring 0.7 cm in greatest short axis diameter (image 42, series 4 and index right axillary lymph node measuring 0.8 cm (image 48, series 4). No acute or aggressive osseous abnormalities within the chest. Regional soft tissues appear normal. Normal appearance of the thyroid gland. --------------------------------------------------------------- Vascular Findings of the abdomen and pelvis: Abdominal aorta: There is no significant atherosclerotic plaque within the normal caliber abdominal aorta. No abdominal aortic dissection or periaortic stranding. Celiac artery: There is a beaded irregularity involving the origin of the celiac artery resulting in short segment severe (greater 70%) luminal narrowing (image sagittal image 95, series 8). Conventional branching pattern. Additionally, there is beaded irregularity involving the common hepatic artery (axial image 116, series 4). The splenic artery appears patent and without a hemodynamically significant stenosis. SMA: Similar to the celiac, there is beaded irregularity involving the origin the SMA resulting short-segment severe (current 70%) luminal narrowing (sagittal image 96, series 8). Additionally, there is beaded irregularity and narrowing throughout the main trunk of the SMA (representative sagittal image 91, series 8). Conventional branching pattern. The distal tributaries of the SMA appear patent without discrete intraluminal filling defect  to suggest distal embolism. Right Renal artery: Solitary; widely patent without hemodynamically significant narrowing. No vessel irregularity to suggest FMD. Left Renal artery: Solitary; widely patent without hemodynamically significant narrowing. No vessel irregularity to suggest FMD. IMA: Widely patent throughout its imaged course. Appears hypertrophy with suspected retrograde supply to the SMA vascular distribution. Pelvic vasculature: The bilateral common, external and internal  iliac arteries are all of normal caliber and widely patent without hemodynamically significant narrowing. No vessel irregularity. Review of the MIP images confirms the above findings. -------------------------------------------------------------------------------- Nonvascular Findings of the abdomen and pelvis: Review of the abdominal organs is limited to the arterial phase of enhancement. There is mild nodularity of the hepatic representative image 115, series 4). There is mild diffuse decreased attenuation of the hepatic parenchyma on this postcontrast examination suggestive of hepatic steatosis. No discrete hyper enhancing hepatic lesions. Normal appearance of the gallbladder given degree distention. No radiopaque gallstones. No intra or extrahepatic biliary duct dilatation. No ascites. There is symmetric enhancement of the bilateral kidneys. No definite renal stones on this postcontrast examination. No discrete renal lesions. No urine obstruction or perinephric stranding. Normal appearance of the bilateral adrenal glands. Normal appearance of the urinary bladder given degree distention. Normal appearance of the pancreas and spleen. Scattered colonic diverticulosis without evidence of diverticulitis. Moderate colonic stool burden without evidence of enteric obstruction. The bowel is otherwise normal in course and caliber without discrete area of wall thickening or evidence of enteric obstruction. Normal appearance of the terminal ileum and appendix. No pneumoperitoneum, pneumatosis or portal venous gas. Scattered retroperitoneal mesenteric lymph nodes are numerous though individually not enlarged by size criteria presumably reactive due to patient body habitus. No bulky retroperitoneal, mesenteric, pelvic or inguinal lymphadenopathy. Post hysterectomy. No discrete adnexal lesion. No free fluid in the pelvic cul-de-sac. No acute or aggressive osseous abnormalities within the abdomen or pelvis. Mild (approximate 3  mm) of anterolisthesis of L4 upon L5 without associated pars defects. Degenerative change of the pubic symphysis. Small mesenteric fat containing periumbilical hernia. Minimal amount of subcutaneous edema about the midline of the low back. IMPRESSION: Chest CTA Impression: 1. No acute cardiopulmonary disease. Specifically, no evidence of thoracic aortic aneurysm or dissection. No central pulmonary embolism. No focal airspace opacities to suggest pneumonia. Abdomen and pelvis CTA Impression: 1. No evidence of abdominal aortic aneurysm or dissection. 2. 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 These findings are chronic in etiology as there is hypertrophy of the IMA which supplies retrograde collateral flow to the SMA vascular distribution. No contrast extravasation or perivascular stranding to suggest an acute inflammatory process. No discrete intraluminal filling defect suggest distal embolism. Correlation with inflammatory markers is recommended. 3. Suspected hepatic steatosis with early cirrhotic change. Correlation with LFTs is recommended. Electronically Signed   By: Sandi Mariscal M.D.   On: 04/03/2016 12:50   I have personally reviewed and evaluated these images and lab results as part of my medical decision-making.   EKG Interpretation   Date/Time:  Friday Apr 03 2016 09:41:52 EDT Ventricular Rate:  91 PR Interval:  138 QRS Duration: 88 QT Interval:  372 QTC Calculation: 457 R Axis:   11 Text Interpretation:  Sinus rhythm with Premature supraventricular  complexes Otherwise normal ECG No significant change was found Confirmed  by Wyvonnia Dusky  MD, Tailynn Armetta [phone]) on 04/03/2016 10:25:02  AM      MDM   Final diagnoses:  Vasculitis ([place])  Mesenteric ischemia ([place])  Patient with central chest pain rating  to the back ongoing since 12 AM. EKG shows no ST elevation MI. She is diaphoretic and uncomfortable appearing. She is hypertensive.  We'll workup for cardiac pathology as well as consider aortic pathology and GI pathology. Aspirin and GI cocktail given.  Troponin negative. LFTs and lipase normal.  CT scan obtained to rule out aortic dissection. No dissection but does show apparent vasculitis of celiac and SMA axis with narrowing and beading.   discussed with Dr. Trula Slade of vascular surgery. He reviewed the images. He states that vasculitis should be treated medically initially and there is no role for surgical intervention at this time. Steroids will be started for possible vasculitis.   Pain has improved with GI cocktail and ASA.  Patient is much more comfortable.  Second troponin sent. Abdomen soft.   Admission d/w Dr. Waldron Labs.   Ezequiel Essex, MD 04/03/16 [phone]

## 2016-04-03 NOTE — H&P (Addendum)
TRH H&P   Patient Demographics:    Lauren Lopez, is a 65 y.o. female  MRN: [id number]   DOB - [birth year]  Admit Date - 04/03/2016  Outpatient Primary MD for the patient is Alesia Richards, MD  Referring MD/NP/PA: Dr Wyvonnia Dusky  Outpatient Specialists: none  Patient coming from: home  Chief Complaint  Patient presents with  . Chest Pain      HPI:    Lauren Lopez  is a 65 y.o. 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 Franki Monte who recommended 1 dose of Solu-Medrol, patient first troponin is negative, EKG is nonacute, currently she is chest pain-free, received aspirin, I was called to admit.    Review of systems:    In addition to the HPI above, No Fever-chills, No Headache, No changes with Vision or hearing, No problems swallowing food or Liquids, Complaints of chest pain dating to the back, Cough or Shortness of Breath, No Abdominal pain, No Nausea or Vommitting, Bowel movements are regular, No Blood in stool or Urine, No dysuria, No new skin rashes or bruises, No new joints pains-aches,  No new weakness, tingling, numbness in any extremity, No recent weight gain or loss, No polyuria,  polydypsia or polyphagia, No significant Mental Stressors.  A full 10 point Review of Systems was done, except as stated above, all other Review of Systems were negative.   With Past History of the following :    Past Medical History  Diagnosis Date  . Pneumonia 5/13  . Multiple allergies   . GERD (gastroesophageal reflux disease)   . Arthritis   . Bilateral carpal tunnel syndrome   . Wears glasses   . Wears dentures     upper  . Positive PPD   . Right rotator cuff tear 01/20/2013  . Hypertension   . Hyperlipidemia   . Unspecified vitamin D deficiency   . Asthma   . Anemia       Past Surgical History  Procedure Laterality Date  . Bilateral salpingoophorectomy  11/28/2010    open laparoscopy with adhesiolysis also  . Knee arthroscopy  2005    rt  . Colonoscopy    . Carpal tunnel release  07/15/2012    Procedure: CARPAL TUNNEL RELEASE;  Surgeon: Cammie Sickle., MD;  Location: [facility];  Service: Orthopedics;  Laterality: Left;  . Shoulder arthroscopy with rotator cuff repair and subacromial decompression Right 01/20/2013    Procedure: RIGHT ARTHROSCOPY SHOULDER DEBRIDEMENT LIMITED, ARTHROSCOPY SHOULDER DECOMPRESSION SUBACROMIAL PARTIAL ACROMIOPLASTY WITH CORACOACROMIAL RELEASE, ROTATOR CUFF REPAIR ;  Surgeon: Johnny Bridge, MD;  Location: [facility];  Service: Orthopedics;  Laterality: Right;  RIGHT SHOULDER SCOPE DEBRIDEMENT, ACRIOMIOPLASTY, ROTATOR CUFF REPAIR  . Abdominal hysterectomy    . Carpal tunnel release Right 05/31/2014    Procedure: RIGHT CARPAL TUNNEL RELEASE;  Surgeon: Cammie Sickle, MD;  Location: [facility];  Service: Orthopedics;  Laterality: Right;      Social History:     Social History  Substance Use Topics  . Smoking status: Never Smoker   . Smokeless tobacco: Not on file  . Alcohol Use: No     Lives - Home  Mobility - independent     Family History :     Family History  Problem  Relation Age of Onset  . Hypertension Mother   . Diabetes Mother   . Stroke Mother   . Hypertension Father   . Heart disease Brother   . Hyperlipidemia Brother   . Diabetes Brother       Home Medications:   Prior to Admission medications   Medication Sig Start Date End Date Taking? Authorizing Provider  calcium carbonate (CALCIUM 600) 600 MG TABS tablet Take 600 mg by mouth daily.   Yes Historical Provider, MD  Cholecalciferol (VITAMIN D3) 5000 UNITS TABS Take 5,000 Units by mouth daily.   Yes Historical Provider, MD  cyanocobalamin 100 MCG tablet Take 100 mcg by mouth daily.   Yes Historical Provider, MD  fexofenadine (ALLEGRA) 180 MG tablet Take 1 tablet (180 mg total) by mouth daily. 10/17/15  Yes Courtney Forcucci, PA-C  fluticasone-salmeterol (ADVAIR HFA) 115-21 MCG/ACT inhaler Inhale 2 puffs into the lungs 2 (two) times daily. 10/17/15  Yes Courtney Forcucci, PA-C  hydrochlorothiazide (HYDRODIURIL) 25 MG tablet TAKE 1 TABLET BY MOUTH EVERY DAY 12/28/15  Yes Unk Pinto, MD  MAGNESIUM PO Take 500 mg by mouth daily.    Yes Historical Provider, MD  RABEprazole (ACIPHEX) 20 MG tablet Take 1 tablet (20 mg total) by mouth daily. 10/17/15  Yes Courtney Forcucci, PA-C  Red Yeast Rice Extract (RED YEAST RICE PO) Take 2,000 mg by mouth daily.   Yes Historical Provider, MD     Allergies:     Allergies  Allergen Reactions  . Metronidazole Other (See Comments)    unknown  . Ppd [Tuberculin Purified Protein Derivative] Other (See Comments)    + PPD 2013 with NEG CXR 05/2012     Physical Exam:   Vitals  Blood pressure 134/90, pulse 73, temperature 97.8 F (36.6 C), resp. rate 13, SpO2 100 %.   1. General  Well-developed female lying in bed in NAD,   2. Normal affect and insight, Not Suicidal or Homicidal, Awake Alert, Oriented X 3.  3. No F.N deficits, ALL C.Nerves Intact, Strength 5/5 all 4 extremities, Sensation intact all 4 extremities, Plantars down going.  4. Ears and  Eyes appear Normal, Conjunctivae clear, PERRLA. Moist Oral Mucosa.  5. Supple Neck, No JVD, No cervical lymphadenopathy appriciated, No Carotid Bruits.  6. Symmetrical Chest wall movement, Good air movement bilaterally, CTAB.  7. RRR, No Gallops, Rubs or Murmurs, No Parasternal Heave.  8. Positive Bowel Sounds, Abdomen Soft, No tenderness, No organomegaly appriciated,No rebound -guarding or rigidity.  9.  No Cyanosis, Normal Skin Turgor, No Skin Rash or Bruise.  10. Good muscle tone,  joints appear normal , no effusions, Normal ROM.     Data Review:    CBC  Recent Labs Lab 04/03/16 1024  WBC 8.3  HGB 13.5  HCT 43.1  PLT 284  MCV 84.0  MCH 26.3  MCHC 31.3  RDW 14.3   ------------------------------------------------------------------------------------------------------------------  Chemistries   Recent Labs Lab 04/03/16 1024 04/03/16 1025  NA 140  --   K 3.3*  --   CL 104  --   CO2 22  --   GLUCOSE 117*  --   BUN 12  --   CREATININE 0.84  --   CALCIUM 9.8  --   AST  --  29  ALT  --  21  ALKPHOS  --  94  BILITOT  --  0.9   ------------------------------------------------------------------------------------------------------------------ CrCl cannot be calculated (Unknown ideal weight.). ------------------------------------------------------------------------------------------------------------------ No results for input(s): TSH, T4TOTAL, T3FREE, THYROIDAB in the last 72 hours.  Invalid input(s): FREET3  Coagulation profile No results for input(s): INR, PROTIME in the last 168 hours. ------------------------------------------------------------------------------------------------------------------- No results for input(s): DDIMER in the last 72 hours. -------------------------------------------------------------------------------------------------------------------  Cardiac Enzymes No results for input(s): CKMB, TROPONINI, MYOGLOBIN in the last 168  hours.  Invalid input(s): CK ------------------------------------------------------------------------------------------------------------------ No results found for: BNP   ---------------------------------------------------------------------------------------------------------------  Urinalysis    Component Value Date/Time   COLORURINE YELLOW 02/04/2016 [place] 02/04/2016 1049   LABSPEC 1.013 02/04/2016 1049   PHURINE 5.5 02/04/2016 1049   GLUCOSEU NEGATIVE 02/04/2016 1049   HGBUR NEGATIVE 02/04/2016 1049   BILIRUBINUR NEGATIVE 02/04/2016 1049   KETONESUR NEGATIVE 02/04/2016 1049   PROTEINUR NEGATIVE 02/04/2016 1049   UROBILINOGEN 0.2 07/02/2014 1146   NITRITE NEGATIVE 02/04/2016 1049   LEUKOCYTESUR NEGATIVE 02/04/2016 1049    ----------------------------------------------------------------------------------------------------------------   Imaging Results:    Dg Chest 2 View  04/03/2016  CLINICAL DATA:  Chest pain, shortness of breath. EXAM: CHEST  2 VIEW COMPARISON:  March 05, 2015. FINDINGS: The heart size and mediastinal contours are within normal limits. Both lungs are clear. No pneumothorax or pleural effusion is noted. The visualized skeletal structures are unremarkable. IMPRESSION: No active cardiopulmonary disease. Electronically Signed   By: Marijo Conception, M.D.   On: 04/03/2016 10:24   Ct Angio Chest/abd/pel For Dissection W And/or W/wo  04/03/2016  CLINICAL DATA:  Chest pain beginning around midnight radiating to the back. Evaluate for dissection. EXAM: CT ANGIOGRAPHY CHEST, ABDOMEN AND PELVIS TECHNIQUE: Multidetector CT imaging through the chest, abdomen and pelvis was performed using the standard protocol during bolus administration of intravenous contrast. Multiplanar reconstructed images and MIPs were obtained and reviewed to evaluate the vascular anatomy. CONTRAST:  100 cc Isovue 370 COMPARISON:  Chest radiograph - 04/03/2016 FINDINGS: Vascular  Findings of the chest: No evidence of thoracic aortic aneurysm or dissection with measurements as follows. Review of the precontrast images are negative for the presence of an intramural hematoma. Normal heart size.  No pericardial effusion. Although this examination was not tailored for the evaluation the pulmonary arteries, there are no discrete filling defects within the central pulmonary arterial tree to suggest central pulmonary embolism. Normal caliber the main pulmonary artery. ------------------------------------------------------------- Thoracic aortic measurements: Sinotubular junction 27 mm as measured in greatest oblique coronal dimension. Proximal ascending aorta 28 mm as measured in greatest oblique axial dimension at the level of the main pulmonary artery (axial mage 45, series 4) and approximately 29 mm in  greatest oblique coronal diameter (coronal image 82, series 7). Aortic arch aorta 25 mm as measured in greatest oblique sagittal dimension. Proximal descending thoracic aorta 21 mm as measured in greatest oblique axial dimension at the level of the main pulmonary artery. Distal descending thoracic aorta 21 mm as measured in greatest oblique axial dimension at the level of the diaphragmatic hiatus. Review of the MIP images confirms the above findings. ------------------------------------------------------------- Non-Vascular Findings of the chest: Evaluation the pulmonary parenchyma is degraded secondary to patient respiratory artifact. There is rather extensive subpleural ground-glass atelectasis. No discrete focal airspace opacities. No pleural effusion or pneumothorax. The central pulmonary airways are widely patent. No discrete pulmonary nodules given limitation of the examination. No bulky mediastinal or hilar adenopathy. Bilateral axillary lymph nodes are prominent though symmetrically sized with index left axillary lymph node measuring 0.7 cm in greatest short axis diameter (image 42, series  4 and index right axillary lymph node measuring 0.8 cm (image 48, series 4). No acute or aggressive osseous abnormalities within the chest. Regional soft tissues appear normal. Normal appearance of the thyroid gland. --------------------------------------------------------------- Vascular Findings of the abdomen and pelvis: Abdominal aorta: There is no significant atherosclerotic plaque within the normal caliber abdominal aorta. No abdominal aortic dissection or periaortic stranding. Celiac artery: There is a beaded irregularity involving the origin of the celiac artery resulting in short segment severe (greater 70%) luminal narrowing (image sagittal image 95, series 8). Conventional branching pattern. Additionally, there is beaded irregularity involving the common hepatic artery (axial image 116, series 4). The splenic artery appears patent and without a hemodynamically significant stenosis. SMA: Similar to the celiac, there is beaded irregularity involving the origin the SMA resulting short-segment severe (current 70%) luminal narrowing (sagittal image 96, series 8). Additionally, there is beaded irregularity and narrowing throughout the main trunk of the SMA (representative sagittal image 91, series 8). Conventional branching pattern. The distal tributaries of the SMA appear patent without discrete intraluminal filling defect to suggest distal embolism. Right Renal artery: Solitary; widely patent without hemodynamically significant narrowing. No vessel irregularity to suggest FMD. Left Renal artery: Solitary; widely patent without hemodynamically significant narrowing. No vessel irregularity to suggest FMD. IMA: Widely patent throughout its imaged course. Appears hypertrophy with suspected retrograde supply to the SMA vascular distribution. Pelvic vasculature: The bilateral common, external and internal iliac arteries are all of normal caliber and widely patent without hemodynamically significant narrowing. No  vessel irregularity. Review of the MIP images confirms the above findings. -------------------------------------------------------------------------------- Nonvascular Findings of the abdomen and pelvis: Review of the abdominal organs is limited to the arterial phase of enhancement. There is mild nodularity of the hepatic representative image 115, series 4). There is mild diffuse decreased attenuation of the hepatic parenchyma on this postcontrast examination suggestive of hepatic steatosis. No discrete hyper enhancing hepatic lesions. Normal appearance of the gallbladder given degree distention. No radiopaque gallstones. No intra or extrahepatic biliary duct dilatation. No ascites. There is symmetric enhancement of the bilateral kidneys. No definite renal stones on this postcontrast examination. No discrete renal lesions. No urine obstruction or perinephric stranding. Normal appearance of the bilateral adrenal glands. Normal appearance of the urinary bladder given degree distention. Normal appearance of the pancreas and spleen. Scattered colonic diverticulosis without evidence of diverticulitis. Moderate colonic stool burden without evidence of enteric obstruction. The bowel is otherwise normal in course and caliber without discrete area of wall thickening or evidence of enteric obstruction. Normal appearance of the terminal ileum and appendix. No pneumoperitoneum, pneumatosis or  portal venous gas. Scattered retroperitoneal mesenteric lymph nodes are numerous though individually not enlarged by size criteria presumably reactive due to patient body habitus. No bulky retroperitoneal, mesenteric, pelvic or inguinal lymphadenopathy. Post hysterectomy. No discrete adnexal lesion. No free fluid in the pelvic cul-de-sac. No acute or aggressive osseous abnormalities within the abdomen or pelvis. Mild (approximate 3 mm) of anterolisthesis of L4 upon L5 without associated pars defects. Degenerative change of the pubic  symphysis. Small mesenteric fat containing periumbilical hernia. Minimal amount of subcutaneous edema about the midline of the low back. IMPRESSION: Chest CTA Impression: 1. No acute cardiopulmonary disease. Specifically, no evidence of thoracic aortic aneurysm or dissection. No central pulmonary embolism. No focal airspace opacities to suggest pneumonia. Abdomen and pelvis CTA Impression: 1. No evidence of abdominal aortic aneurysm or dissection. 2. 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 These findings are chronic in etiology as there is hypertrophy of the IMA which supplies retrograde collateral flow to the SMA vascular distribution. No contrast extravasation or perivascular stranding to suggest an acute inflammatory process. No discrete intraluminal filling defect suggest distal embolism. Correlation with inflammatory markers is recommended. 3. Suspected hepatic steatosis with early cirrhotic change. Correlation with LFTs is recommended. Electronically Signed   By: Sandi Mariscal M.D.   On: 04/03/2016 12:50    My personal review of EKG: Rhythm NSR, Rate  91/min, QTc 457o Acute ST changes   Assessment & Plan:    Active Problems:   Essential hypertension   GERD (gastroesophageal reflux disease)   Asthma   Vasculitis ([name])   Chest pain  Chest pain - Risk factor includes history of hypertension, and diabetes mellitus, currently patient is chest pain-free, will be admitted to telemetry, will cycle cardiac enzymes, will obtain 2-D echo in a.m. - I think the abnormal findings suspicious for vasculitis and CT scan, most likely contributing to her symptoms.  Abnormal CT chest/abdomen/pelvis - Patient findings on CT chest/abdomen/pelvis, suspicious for vasculitis and celiac/and SMA - Vascular surgery consulted  by ED, I discussed with Dr. Trula Slade, he thinks these findings are not related to atherosclerosis, no indication for anticoagulation or surgical intervention, will obtain out immune workup. - D/W rheumatology at [facility] Dr. Tarry Kos , has low suspicion for vasculitis giving no significant symptomatology , and they do recommend steroids .  Hypertension - Continue with home medication  Diabetes mellitus - Diet controlled, will check CBGs regularly  GERD - Continue with PPI   DVT Prophylaxis Heparin   AM Labs Ordered, also please review Full Orders  Family Communication: Admission, patients condition and plan of care including tests being ordered have been discussed with the patient and family at bedside who indicate understanding and agree with the plan and Code Status.  Code Statu Full  Likely DC to  Home  Condition GUARDED    Consults called: Vascular Surgery  Admission status: Observation  Time spent in minutes : 60 minutes   Lindora Alviar M.D on 04/03/2016 at 4:19 PM  Between 7am to 7pm - Pager - [phone]. After 7pm go to [email or web address] - password [facility]  [facility] - Office  [phone]

## 2016-04-04 ENCOUNTER — Observation Stay (HOSPITAL_BASED_OUTPATIENT_CLINIC_OR_DEPARTMENT_OTHER): Payer: PRIVATE HEALTH INSURANCE

## 2016-04-04 DIAGNOSIS — R079 Chest pain, unspecified: Secondary | ICD-10-CM

## 2016-04-04 DIAGNOSIS — K219 Gastro-esophageal reflux disease without esophagitis: Secondary | ICD-10-CM

## 2016-04-04 DIAGNOSIS — M5489 Other dorsalgia: Secondary | ICD-10-CM

## 2016-04-04 DIAGNOSIS — I776 Arteritis, unspecified: Secondary | ICD-10-CM

## 2016-04-04 DIAGNOSIS — M549 Dorsalgia, unspecified: Secondary | ICD-10-CM | POA: Insufficient documentation

## 2016-04-04 DIAGNOSIS — I10 Essential (primary) hypertension: Secondary | ICD-10-CM

## 2016-04-04 LAB — CBC
HEMATOCRIT: 38.7 % (ref 36.0–46.0)
Hemoglobin: 11.7 g/dL — ABNORMAL LOW (ref 12.0–15.0)
MCH: 25.5 pg — ABNORMAL LOW (ref 26.0–34.0)
MCHC: 30.2 g/dL (ref 30.0–36.0)
MCV: 84.3 fL (ref 78.0–100.0)
PLATELETS: 249 10*3/uL (ref 150–400)
RBC: 4.59 MIL/uL (ref 3.87–5.11)
RDW: 14.6 % (ref 11.5–15.5)
WBC: 10.4 10*3/uL (ref 4.0–10.5)

## 2016-04-04 LAB — ECHOCARDIOGRAM COMPLETE
HEIGHTINCHES: 63 in
WEIGHTICAEL: 3121.6 [oz_av]

## 2016-04-04 LAB — COMPREHENSIVE METABOLIC PANEL
ALT: 17 U/L (ref 14–54)
AST: 21 U/L (ref 15–41)
Albumin: 3.3 g/dL — ABNORMAL LOW (ref 3.5–5.0)
Alkaline Phosphatase: 84 U/L (ref 38–126)
Anion gap: 12 (ref 5–15)
BILIRUBIN TOTAL: 0.8 mg/dL (ref 0.3–1.2)
BUN: 7 mg/dL (ref 6–20)
CHLORIDE: 105 mmol/L (ref 101–111)
CO2: 23 mmol/L (ref 22–32)
CREATININE: 0.82 mg/dL (ref 0.44–1.00)
Calcium: 9.4 mg/dL (ref 8.9–10.3)
Glucose, Bld: 156 mg/dL — ABNORMAL HIGH (ref 65–99)
POTASSIUM: 3.3 mmol/L — AB (ref 3.5–5.1)
Sodium: 140 mmol/L (ref 135–145)
TOTAL PROTEIN: 6.6 g/dL (ref 6.5–8.1)

## 2016-04-04 LAB — GLUCOSE, CAPILLARY
GLUCOSE-CAPILLARY: 125 mg/dL — AB (ref 65–99)
GLUCOSE-CAPILLARY: 90 mg/dL (ref 65–99)
Glucose-Capillary: 103 mg/dL — ABNORMAL HIGH (ref 65–99)
Glucose-Capillary: 131 mg/dL — ABNORMAL HIGH (ref 65–99)

## 2016-04-04 LAB — C4 COMPLEMENT: Complement C4, Body Fluid: 28 mg/dL (ref 14–44)

## 2016-04-04 LAB — COMPLEMENT, TOTAL: Compl, Total (CH50): >62 U/mL — ABNORMAL HIGH (ref 42–60)

## 2016-04-04 LAB — RHEUMATOID FACTOR: Rhuematoid fact SerPl-aCnc: <10 IU/mL (ref 0.0–13.9)

## 2016-04-04 LAB — C3 COMPLEMENT: C3 Complement: 165 mg/dL (ref 82–167)

## 2016-04-04 LAB — MAGNESIUM: Magnesium: 1.7 mg/dL (ref 1.7–2.4)

## 2016-04-04 LAB — TROPONIN I

## 2016-04-04 MED ORDER — TRAMADOL HCL 50 MG PO TABS
100.0000 mg | ORAL_TABLET | Freq: Three times a day (TID) | ORAL | Status: DC
  Administered 2016-04-04: 100 mg via ORAL
  Administered 2016-04-04: 50 mg via ORAL
  Administered 2016-04-05: 100 mg via ORAL
  Filled 2016-04-04 (×4): qty 2

## 2016-04-04 MED ORDER — POTASSIUM CHLORIDE CRYS ER 20 MEQ PO TBCR
40.0000 meq | EXTENDED_RELEASE_TABLET | Freq: Once | ORAL | Status: AC
  Administered 2016-04-04: 40 meq via ORAL
  Filled 2016-04-04: qty 2

## 2016-04-04 MED ORDER — MAGNESIUM SULFATE 50 % IJ SOLN
3.0000 g | Freq: Once | INTRAVENOUS | Status: AC
  Administered 2016-04-04: 3 g via INTRAVENOUS
  Filled 2016-04-04: qty 6

## 2016-04-04 NOTE — Progress Notes (Signed)
  Echocardiogram 2D Echocardiogram has been performed.  Lauren Lopez 04/04/2016, 3:50 PM

## 2016-04-04 NOTE — Progress Notes (Signed)
Full note  To follow.  CT indicates possible vasculits of mesenteric vessels.  Patient currently without symptoms of mesenteric ischemia.  Her only complaint is that of back pain.  Vasculitis work-up underway.  No indication for surgical intervention   WElls Kort Stettler

## 2016-04-04 NOTE — Progress Notes (Signed)
PROGRESS NOTE    Lauren Lopez  [id number] DOB: 1951/02/17 DOA: 04/03/2016 PCP: Alesia Richards, MD  Outpatient Specialists:    Assessment & Plan:   Active Problems:   Essential hypertension   GERD (gastroesophageal reflux disease)   Asthma   Vasculitis ([name])   Chest pain  #1 chest pain Patient presented with chest pain as well as back pain. Patient with multiple risk factors of hypertension, diabetes. CT angiogram of chest abdomen and pelvis suspicious for vasculitis with labs pending which may be the etiology of patient's chest pain. Patient currently chest pain-free. Cardiac enzymes negative 3. 2-D echo pending. Follow.  #2 back pain Seems musculoskeletal in nature as worsens with movement with tenderness to palpation. Check a UA with cultures and sensitivities. Will place patient on scheduled or tram due to prior history of peptic ulcer disease. Warm compresses. Follow.  #3 vasculitis Autoimmune workup underway. Patient has been seen in consultation by vascular surgery who feel no indication for surgery at this time. Patient will likely need to be referred to a rheumatologist in the outpatient setting for further evaluation and management.  #4 gastroesophageal reflux disease PPI.  #5 hypertension Stable. Continue HCTZ.   DVT prophylaxis: Heparin Code Status: Full Family Communication: Updated patient and family at bedside. Disposition Plan: Home when medically stable and cardiac workup is negative with control of back pain.   Consultants:   Vascular surgery: Dr. Trula Slade 04/04/2016  Procedures:   Chest x-ray 04/03/2016  CT angiogram chest, abdomen, pelvis 04/03/2016  2-D echo pending 04/04/2016  Antimicrobials:   None   Subjective: Patient complaining of back pain with coughing and no movement. Patient denies any chest pain. No shortness of breath.  Objective: Filed Vitals:   04/03/16 1723 04/03/16 2048 04/04/16 0446 04/04/16 0808  BP:  159/77 135/66 107/54   Pulse: 76 70 66   Temp: 97.9 F (36.6 C) 98 F (36.7 C) 98.2 F (36.8 C)   TempSrc: Oral Oral Oral   Resp:  18 18   Height: 5\' 3"  (1.6 m)     Weight: 88.497 kg (195 lb 1.6 oz)     SpO2: 100% 99% 97% 99%    Intake/Output Summary (Last 24 hours) at 04/04/16 1233 Last data filed at 04/03/16 2346  Gross per 24 hour  Intake    240 ml  Output   1200 ml  Net   -960 ml   Filed Weights   04/03/16 1723  Weight: 88.497 kg (195 lb 1.6 oz)    Examination:  General exam: Appears calm and comfortable.Mid to lower back vertebral region tender to palpation on the right greater than left.  Respiratory system: Clear to auscultation. Respiratory effort normal. Cardiovascular system: S1 & S2 heard, RRR. No JVD, murmurs, rubs, gallops or clicks. No pedal edema. Gastrointestinal system: Abdomen is nondistended, soft and nontender. No organomegaly or masses felt. Normal bowel sounds heard. Central nervous system: Alert and oriented. No focal neurological deficits. Extremities: Symmetric 5 x 5 power. Skin: No rashes, lesions or ulcers Psychiatry: Judgement and insight appear normal. Mood & affect appropriate.     Data Reviewed: I have personally reviewed following labs and imaging studies  CBC:  Recent Labs Lab 04/03/16 1024 04/04/16 0846  WBC 8.3 10.4  HGB 13.5 11.7*  HCT 43.1 38.7  MCV 84.0 84.3  PLT 284 0000000   Basic Metabolic Panel:  Recent Labs Lab 04/03/16 1024 04/04/16 0846  NA 140 140  K 3.3* 3.3*  CL 104 105  CO2 22 23  GLUCOSE 117* 156*  BUN 12 7  CREATININE 0.84 0.82  CALCIUM 9.8 9.4  MG  --  1.7   GFR: Estimated Creatinine Clearance: 73.1 mL/min (by C-G formula based on Cr of 0.82). Liver Function Tests:  Recent Labs Lab 04/03/16 1025 04/04/16 0846  AST 29 21  ALT 21 17  ALKPHOS 94 84  BILITOT 0.9 0.8  PROT 7.6 6.6  ALBUMIN 4.0 3.3*    Recent Labs Lab 04/03/16 1025  LIPASE 27   No results for input(s): AMMONIA in the  last 168 hours. Coagulation Profile: No results for input(s): INR, PROTIME in the last 168 hours. Cardiac Enzymes:  Recent Labs Lab 04/03/16 1630 04/03/16 1829 04/03/16 2056 04/04/16 0011  TROPONINI <0.03 <0.03 <0.03 <0.03   BNP (last 3 results) No results for input(s): PROBNP in the last 8760 hours. HbA1C: No results for input(s): HGBA1C in the last 72 hours. CBG:  Recent Labs Lab 04/04/16 0631 04/04/16 1111  GLUCAP 125* 90   Lipid Profile: No results for input(s): CHOL, HDL, LDLCALC, TRIG, CHOLHDL, LDLDIRECT in the last 72 hours. Thyroid Function Tests: No results for input(s): TSH, T4TOTAL, FREET4, T3FREE, THYROIDAB in the last 72 hours. Anemia Panel: No results for input(s): VITAMINB12, FOLATE, FERRITIN, TIBC, IRON, RETICCTPCT in the last 72 hours. Urine analysis:    Component Value Date/Time   COLORURINE YELLOW 02/04/2016 [place] 02/04/2016 1049   LABSPEC 1.013 02/04/2016 1049   PHURINE 5.5 02/04/2016 1049   GLUCOSEU NEGATIVE 02/04/2016 1049   HGBUR NEGATIVE 02/04/2016 1049   BILIRUBINUR NEGATIVE 02/04/2016 1049   KETONESUR NEGATIVE 02/04/2016 1049   PROTEINUR NEGATIVE 02/04/2016 1049   UROBILINOGEN 0.2 07/02/2014 1146   NITRITE NEGATIVE 02/04/2016 1049   LEUKOCYTESUR NEGATIVE 02/04/2016 1049   Sepsis Labs: @LABRCNTIP (procalcitonin:4,lacticidven:4)  )No results found for this or any previous visit (from the past 240 hour(s)).       Radiology Studies: Dg Chest 2 View  04/03/2016  CLINICAL DATA:  Chest pain, shortness of breath. EXAM: CHEST  2 VIEW COMPARISON:  March 05, 2015. FINDINGS: The heart size and mediastinal contours are within normal limits. Both lungs are clear. No pneumothorax or pleural effusion is noted. The visualized skeletal structures are unremarkable. IMPRESSION: No active cardiopulmonary disease. Electronically Signed   By: Marijo Conception, M.D.   On: 04/03/2016 10:24   Ct Angio Chest/abd/pel For Dissection W And/or  W/wo  04/03/2016  CLINICAL DATA:  Chest pain beginning around midnight radiating to the back. Evaluate for dissection. EXAM: CT ANGIOGRAPHY CHEST, ABDOMEN AND PELVIS TECHNIQUE: Multidetector CT imaging through the chest, abdomen and pelvis was performed using the standard protocol during bolus administration of intravenous contrast. Multiplanar reconstructed images and MIPs were obtained and reviewed to evaluate the vascular anatomy. CONTRAST:  100 cc Isovue 370 COMPARISON:  Chest radiograph - 04/03/2016 FINDINGS: Vascular Findings of the chest: No evidence of thoracic aortic aneurysm or dissection with measurements as follows. Review of the precontrast images are negative for the presence of an intramural hematoma. Normal heart size.  No pericardial effusion. Although this examination was not tailored for the evaluation the pulmonary arteries, there are no discrete filling defects within the central pulmonary arterial tree to suggest central pulmonary embolism. Normal caliber the main pulmonary artery. ------------------------------------------------------------- Thoracic aortic measurements: Sinotubular junction 27 mm as measured in greatest oblique coronal dimension. Proximal ascending aorta 28 mm as measured in greatest oblique axial dimension at the level of the main pulmonary artery (  axial mage 45, series 4) and approximately 29 mm in greatest oblique coronal diameter (coronal image 82, series 7). Aortic arch aorta 25 mm as measured in greatest oblique sagittal dimension. Proximal descending thoracic aorta 21 mm as measured in greatest oblique axial dimension at the level of the main pulmonary artery. Distal descending thoracic aorta 21 mm as measured in greatest oblique axial dimension at the level of the diaphragmatic hiatus. Review of the MIP images confirms the above findings. ------------------------------------------------------------- Non-Vascular Findings of the chest: Evaluation the pulmonary  parenchyma is degraded secondary to patient respiratory artifact. There is rather extensive subpleural ground-glass atelectasis. No discrete focal airspace opacities. No pleural effusion or pneumothorax. The central pulmonary airways are widely patent. No discrete pulmonary nodules given limitation of the examination. No bulky mediastinal or hilar adenopathy. Bilateral axillary lymph nodes are prominent though symmetrically sized with index left axillary lymph node measuring 0.7 cm in greatest short axis diameter (image 42, series 4 and index right axillary lymph node measuring 0.8 cm (image 48, series 4). No acute or aggressive osseous abnormalities within the chest. Regional soft tissues appear normal. Normal appearance of the thyroid gland. --------------------------------------------------------------- Vascular Findings of the abdomen and pelvis: Abdominal aorta: There is no significant atherosclerotic plaque within the normal caliber abdominal aorta. No abdominal aortic dissection or periaortic stranding. Celiac artery: There is a beaded irregularity involving the origin of the celiac artery resulting in short segment severe (greater 70%) luminal narrowing (image sagittal image 95, series 8). Conventional branching pattern. Additionally, there is beaded irregularity involving the common hepatic artery (axial image 116, series 4). The splenic artery appears patent and without a hemodynamically significant stenosis. SMA: Similar to the celiac, there is beaded irregularity involving the origin the SMA resulting short-segment severe (current 70%) luminal narrowing (sagittal image 96, series 8). Additionally, there is beaded irregularity and narrowing throughout the main trunk of the SMA (representative sagittal image 91, series 8). Conventional branching pattern. The distal tributaries of the SMA appear patent without discrete intraluminal filling defect to suggest distal embolism. Right Renal artery: Solitary;  widely patent without hemodynamically significant narrowing. No vessel irregularity to suggest FMD. Left Renal artery: Solitary; widely patent without hemodynamically significant narrowing. No vessel irregularity to suggest FMD. IMA: Widely patent throughout its imaged course. Appears hypertrophy with suspected retrograde supply to the SMA vascular distribution. Pelvic vasculature: The bilateral common, external and internal iliac arteries are all of normal caliber and widely patent without hemodynamically significant narrowing. No vessel irregularity. Review of the MIP images confirms the above findings. -------------------------------------------------------------------------------- Nonvascular Findings of the abdomen and pelvis: Review of the abdominal organs is limited to the arterial phase of enhancement. There is mild nodularity of the hepatic representative image 115, series 4). There is mild diffuse decreased attenuation of the hepatic parenchyma on this postcontrast examination suggestive of hepatic steatosis. No discrete hyper enhancing hepatic lesions. Normal appearance of the gallbladder given degree distention. No radiopaque gallstones. No intra or extrahepatic biliary duct dilatation. No ascites. There is symmetric enhancement of the bilateral kidneys. No definite renal stones on this postcontrast examination. No discrete renal lesions. No urine obstruction or perinephric stranding. Normal appearance of the bilateral adrenal glands. Normal appearance of the urinary bladder given degree distention. Normal appearance of the pancreas and spleen. Scattered colonic diverticulosis without evidence of diverticulitis. Moderate colonic stool burden without evidence of enteric obstruction. The bowel is otherwise normal in course and caliber without discrete area of wall thickening or evidence of enteric obstruction. Normal appearance  of the terminal ileum and appendix. No pneumoperitoneum, pneumatosis or portal  venous gas. Scattered retroperitoneal mesenteric lymph nodes are numerous though individually not enlarged by size criteria presumably reactive due to patient body habitus. No bulky retroperitoneal, mesenteric, pelvic or inguinal lymphadenopathy. Post hysterectomy. No discrete adnexal lesion. No free fluid in the pelvic cul-de-sac. No acute or aggressive osseous abnormalities within the abdomen or pelvis. Mild (approximate 3 mm) of anterolisthesis of L4 upon L5 without associated pars defects. Degenerative change of the pubic symphysis. Small mesenteric fat containing periumbilical hernia. Minimal amount of subcutaneous edema about the midline of the low back. IMPRESSION: Chest CTA Impression: 1. No acute cardiopulmonary disease. Specifically, no evidence of thoracic aortic aneurysm or dissection. No central pulmonary embolism. No focal airspace opacities to suggest pneumonia. Abdomen and pelvis CTA Impression: 1. No evidence of abdominal aortic aneurysm or dissection. 2. 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 These findings are chronic in etiology as there is hypertrophy of the IMA which supplies retrograde collateral flow to the SMA vascular distribution. No contrast extravasation or perivascular stranding to suggest an acute inflammatory process. No discrete intraluminal filling defect suggest distal embolism. Correlation with inflammatory markers is recommended. 3. Suspected hepatic steatosis with early cirrhotic change. Correlation with LFTs is recommended. Electronically Signed   By: Sandi Mariscal M.D.   On: 04/03/2016 12:50        Scheduled Meds: . aspirin EC  81 mg Oral Daily  . calcium carbonate  1 tablet Oral Daily  . cholecalciferol  5,000 Units Oral Daily  . heparin  5,000 Units Subcutaneous Q8H  .  hydrochlorothiazide  25 mg Oral Daily  . loratadine  10 mg Oral Daily  . magnesium oxide  400 mg Oral Daily  . magnesium sulfate 1 - 4 g bolus IVPB  3 g Intravenous Once  . mometasone-formoterol  2 puff Inhalation BID  . pantoprazole  40 mg Oral Daily  . potassium chloride  40 mEq Oral Once  . traMADol  100 mg Oral TID  . cyanocobalamin  100 mcg Oral Daily   Continuous Infusions: . sodium chloride 75 mL/hr at 04/04/16 0510        Time spent: 73 minutes    Shizue Kaseman, MD [facility] Pager [phone]  If 7PM-7AM, please contact night-coverage [email or web address] Password [facility] 04/04/2016, 12:33 PM

## 2016-04-05 DIAGNOSIS — M549 Dorsalgia, unspecified: Secondary | ICD-10-CM | POA: Diagnosis present

## 2016-04-05 DIAGNOSIS — M546 Pain in thoracic spine: Secondary | ICD-10-CM

## 2016-04-05 DIAGNOSIS — E119 Type 2 diabetes mellitus without complications: Secondary | ICD-10-CM

## 2016-04-05 LAB — GLUCOSE, CAPILLARY
GLUCOSE-CAPILLARY: 113 mg/dL — AB (ref 65–99)
Glucose-Capillary: 92 mg/dL (ref 65–99)

## 2016-04-05 LAB — BASIC METABOLIC PANEL
Anion gap: 9 (ref 5–15)
BUN: 12 mg/dL (ref 6–20)
CHLORIDE: 106 mmol/L (ref 101–111)
CO2: 24 mmol/L (ref 22–32)
CREATININE: 0.95 mg/dL (ref 0.44–1.00)
Calcium: 9.1 mg/dL (ref 8.9–10.3)
GFR calc Af Amer: >60 mL/min (ref >60)
GFR calc non Af Amer: >60 mL/min (ref >60)
GLUCOSE: 123 mg/dL — AB (ref 65–99)
Potassium: 3.8 mmol/L (ref 3.5–5.1)
Sodium: 139 mmol/L (ref 135–145)

## 2016-04-05 LAB — MAGNESIUM: MAGNESIUM: 2 mg/dL (ref 1.7–2.4)

## 2016-04-05 MED ORDER — TRAMADOL HCL 50 MG PO TABS: 50.0000 mg | ORAL_TABLET | Freq: Four times a day (QID) | ORAL | Status: DC | PRN

## 2016-04-05 MED ORDER — METHOCARBAMOL 500 MG PO TABS: 500.0000 mg | ORAL_TABLET | Freq: Three times a day (TID) | ORAL | Status: DC | PRN

## 2016-04-05 NOTE — Progress Notes (Signed)
PT Cancellation and Discharge Note  Patient Details Name: Lauren Lopez MRN: [id number] DOB: 1951/06/28   Cancelled Treatment:    Reason Eval/Treat Not Completed: PT screened, no needs identified, will sign off.  Pt up ambulating independently in room and performing own ADLs this am.  No acute PT needs at this time, will sign off.     Tyshauna Finkbiner, Thornton Papas 04/05/2016, 8:24 AM

## 2016-04-05 NOTE — Progress Notes (Signed)
Discharged to home with family office visits in place teaching done  

## 2016-04-05 NOTE — Discharge Summary (Signed)
Physician Discharge Summary  Lauren Lopez [id number] DOB: 1951/03/01 DOA: 04/03/2016  PCP: Alesia Richards, MD  Admit date: 04/03/2016 Discharge date: 04/05/2016  Time spent: 65 minutes  Recommendations for Outpatient Follow-up:  1. Follow-up with Alesia Richards, MD in 1-2 weeks. On follow-up patient's back pain will need to be reassessed as it was felt to be musculoskeletal in nature. Autoimmune workup labs for vasculitis will need to be followed up upon as these were pending on day of discharge. Patient may benefit from referral to rheumatology for further evaluation and management. Patient need a basic metabolic profile done to follow-up on his lites and renal function on follow-up.   Discharge Diagnoses:  Principal Problem:   Chest pain Active Problems:   Vasculitis ([name])   Back pain   Essential hypertension   GERD (gastroesophageal reflux disease)   Asthma   Notalgia   Discharge Condition: Stable and improved  Diet recommendation: Carb modified diet.  Filed Weights   04/03/16 1723  Weight: 88.497 kg (195 lb 1.6 oz)    History of present illness:  Per Dr Landis Gandy Lauren Lopez is a 65 y.o. 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 Franki Monte who recommended 1 dose of Solu-Medrol,  patient first troponin is negative, EKG is nonacute, currently she is chest pain-free, received aspirin, I was called to admit.  Hospital Course:  #1 chest pain Patient presented with chest pain as well as back pain. Patient with multiple risk factors of hypertension, diabetes. CT angiogram of chest abdomen and pelvis suspicious for vasculitis with labs pending which may be the etiology of patient's chest pain. Patient remained chest pain-free throughout the hospitalization. Cardiac enzymes negative 3. 2-D echo with a EF of 55-60% with no wall motion abnormalities. Probable mild diastolic dysfunction, trace MR and TR. Patient will be discharged home in stable and improved condition. Outpatient follow-up.   #2 back pain Patient with complaints of significant back pain. Seems musculoskeletal in nature as worsens with movement with tenderness to palpation. Patient was placed on scheduled ultram as well as warm compresses with significant clinical improvement. Patient did have complaints that back pain also came in the form of spasms. Patient will be discharged home on ultram prn and robaxin prn with close outpatient follow up.  #3 vasculitis Concern for vasculitis noted on CT abdomen concerning for vasculitis of mesenteric vessels. Autoimmune workup underway. Patient has been seen in consultation by vascular surgery who feel no indication for surgery at this time as patient with no symptoms of mesenteric ischemia. Patient will likely need to be referred to a rheumatologist in the outpatient setting for further evaluation and management.  #4 gastroesophageal reflux disease Maintained on a PPI.  #5 hypertension Stable. Continued on home regimen of HCTZ.  Procedures:  Chest x-ray 04/03/2016  CT angiogram chest, abdomen, pelvis 04/03/2016  2-D echo 04/04/2016    Consultations:  Vascular surgery: Dr. Trula Slade 04/04/2016  Discharge Exam: Filed Vitals:   04/04/16 2126 04/05/16 0521  BP:  110/66 122/70  Pulse: 69 65  Temp: 97.9 F (36.6 C) 97.7 F (36.5 C)  Resp: 18 18    General: NAD Cardiovascular: RRR Respiratory: CTAB  Discharge Instructions   Discharge Instructions    Diet Carb Modified    Complete by:  As directed      Discharge instructions    Complete by:  As directed   Follow up with Lopez,Lauren DAVID, MD in 1-2 weeks.     Increase activity slowly    Complete by:  As directed           Current Discharge Medication List    START taking these medications   Details  methocarbamol (ROBAXIN) 500 MG tablet Take 1 tablet (500 mg total) by mouth every 8 (eight) hours as needed for muscle spasms. Qty: 20 tablet, Refills: 0    traMADol (ULTRAM) 50 MG tablet Take 1 tablet (50 mg total) by mouth every 6 (six) hours as needed for moderate pain. Qty: 30 tablet      CONTINUE these medications which have NOT CHANGED   Details  calcium carbonate (CALCIUM 600) 600 MG TABS tablet Take 600 mg by mouth daily.    Cholecalciferol (VITAMIN D3) 5000 UNITS TABS Take 5,000 Units by mouth daily.    cyanocobalamin 100 MCG tablet Take 100 mcg by mouth daily.    fexofenadine (ALLEGRA) 180 MG tablet Take 1 tablet (180 mg total) by mouth daily. Qty: 90 tablet, Refills: 6    fluticasone-salmeterol (ADVAIR HFA) 115-21 MCG/ACT inhaler Inhale 2 puffs into the lungs 2 (two) times daily. Qty: 1 Inhaler, Refills: 6    hydrochlorothiazide (HYDRODIURIL) 25 MG tablet TAKE 1 TABLET BY MOUTH EVERY DAY Qty: 90 tablet, Refills: 0   Associated Diagnoses: Essential hypertension    MAGNESIUM PO Take 500 mg by mouth daily.     RABEprazole (ACIPHEX) 20 MG tablet Take 1 tablet (20 mg total) by mouth daily. Qty: 90 tablet, Refills: 3    Red Yeast Rice Extract (RED YEAST RICE PO) Take 2,000 mg by mouth daily.       Allergies  Allergen Reactions  . Metronidazole Other (See Comments)    unknown  . Ppd [Tuberculin Purified Protein Derivative] Other (See Comments)    + PPD 2013  with NEG CXR 05/2012   Follow-up Information    Follow up with Lopez,Lauren DAVID, MD. Schedule an appointment as soon as possible for a visit in 1 week.   Specialty:  Internal Medicine   Why:  f/u in 1-2 weeks.   Contact information:   [address] [place] [address] [phone]        The results of significant diagnostics from this hospitalization (including imaging, microbiology, ancillary and laboratory) are listed below for reference.    Significant Diagnostic Studies: Dg Chest 2 View  04/03/2016  CLINICAL DATA:  Chest pain, shortness of breath. EXAM: CHEST  2 VIEW COMPARISON:  March 05, 2015. FINDINGS: The heart size and mediastinal contours are within normal limits. Both lungs are clear. No pneumothorax or pleural effusion is noted. The visualized skeletal structures are unremarkable. IMPRESSION: No active cardiopulmonary disease. Electronically Signed   By: Marijo Conception, M.D.   On: 04/03/2016 10:24   Ct Angio Chest/abd/pel For Dissection W And/or W/wo  04/03/2016  CLINICAL DATA:  Chest pain beginning around midnight radiating to the back. Evaluate for dissection. EXAM: CT ANGIOGRAPHY CHEST, ABDOMEN AND  PELVIS TECHNIQUE: Multidetector CT imaging through the chest, abdomen and pelvis was performed using the standard protocol during bolus administration of intravenous contrast. Multiplanar reconstructed images and MIPs were obtained and reviewed to evaluate the vascular anatomy. CONTRAST:  100 cc Isovue 370 COMPARISON:  Chest radiograph - 04/03/2016 FINDINGS: Vascular Findings of the chest: No evidence of thoracic aortic aneurysm or dissection with measurements as follows. Review of the precontrast images are negative for the presence of an intramural hematoma. Normal heart size.  No pericardial effusion. Although this examination was not tailored for the evaluation the pulmonary arteries, there are no discrete filling defects within the central pulmonary arterial  tree to suggest central pulmonary embolism. Normal caliber the main pulmonary artery. ------------------------------------------------------------- Thoracic aortic measurements: Sinotubular junction 27 mm as measured in greatest oblique coronal dimension. Proximal ascending aorta 28 mm as measured in greatest oblique axial dimension at the level of the main pulmonary artery (axial mage 45, series 4) and approximately 29 mm in greatest oblique coronal diameter (coronal image 82, series 7). Aortic arch aorta 25 mm as measured in greatest oblique sagittal dimension. Proximal descending thoracic aorta 21 mm as measured in greatest oblique axial dimension at the level of the main pulmonary artery. Distal descending thoracic aorta 21 mm as measured in greatest oblique axial dimension at the level of the diaphragmatic hiatus. Review of the MIP images confirms the above findings. ------------------------------------------------------------- Non-Vascular Findings of the chest: Evaluation the pulmonary parenchyma is degraded secondary to patient respiratory artifact. There is rather extensive subpleural ground-glass atelectasis. No discrete focal airspace opacities. No pleural effusion or pneumothorax. The central pulmonary airways are widely patent. No discrete pulmonary nodules given limitation of the examination. No bulky mediastinal or hilar adenopathy. Bilateral axillary lymph nodes are prominent though symmetrically sized with index left axillary lymph node measuring 0.7 cm in greatest short axis diameter (image 42, series 4 and index right axillary lymph node measuring 0.8 cm (image 48, series 4). No acute or aggressive osseous abnormalities within the chest. Regional soft tissues appear normal. Normal appearance of the thyroid gland. --------------------------------------------------------------- Vascular Findings of the abdomen and pelvis: Abdominal aorta: There is no significant atherosclerotic plaque within the  normal caliber abdominal aorta. No abdominal aortic dissection or periaortic stranding. Celiac artery: There is a beaded irregularity involving the origin of the celiac artery resulting in short segment severe (greater 70%) luminal narrowing (image sagittal image 95, series 8). Conventional branching pattern. Additionally, there is beaded irregularity involving the common hepatic artery (axial image 116, series 4). The splenic artery appears patent and without a hemodynamically significant stenosis. SMA: Similar to the celiac, there is beaded irregularity involving the origin the SMA resulting short-segment severe (current 70%) luminal narrowing (sagittal image 96, series 8). Additionally, there is beaded irregularity and narrowing throughout the main trunk of the SMA (representative sagittal image 91, series 8). Conventional branching pattern. The distal tributaries of the SMA appear patent without discrete intraluminal filling defect to suggest distal embolism. Right Renal artery: Solitary; widely patent without hemodynamically significant narrowing. No vessel irregularity to suggest FMD. Left Renal artery: Solitary; widely patent without hemodynamically significant narrowing. No vessel irregularity to suggest FMD. IMA: Widely patent throughout its imaged course. Appears hypertrophy with suspected retrograde supply to the SMA vascular distribution. Pelvic vasculature: The bilateral common, external and internal iliac arteries are all of normal caliber and widely patent without hemodynamically significant narrowing. No vessel irregularity. Review of the MIP images confirms the above findings. -------------------------------------------------------------------------------- Nonvascular Findings of the abdomen  and pelvis: Review of the abdominal organs is limited to the arterial phase of enhancement. There is mild nodularity of the hepatic representative image 115, series 4). There is mild diffuse decreased  attenuation of the hepatic parenchyma on this postcontrast examination suggestive of hepatic steatosis. No discrete hyper enhancing hepatic lesions. Normal appearance of the gallbladder given degree distention. No radiopaque gallstones. No intra or extrahepatic biliary duct dilatation. No ascites. There is symmetric enhancement of the bilateral kidneys. No definite renal stones on this postcontrast examination. No discrete renal lesions. No urine obstruction or perinephric stranding. Normal appearance of the bilateral adrenal glands. Normal appearance of the urinary bladder given degree distention. Normal appearance of the pancreas and spleen. Scattered colonic diverticulosis without evidence of diverticulitis. Moderate colonic stool burden without evidence of enteric obstruction. The bowel is otherwise normal in course and caliber without discrete area of wall thickening or evidence of enteric obstruction. Normal appearance of the terminal ileum and appendix. No pneumoperitoneum, pneumatosis or portal venous gas. Scattered retroperitoneal mesenteric lymph nodes are numerous though individually not enlarged by size criteria presumably reactive due to patient body habitus. No bulky retroperitoneal, mesenteric, pelvic or inguinal lymphadenopathy. Post hysterectomy. No discrete adnexal lesion. No free fluid in the pelvic cul-de-sac. No acute or aggressive osseous abnormalities within the abdomen or pelvis. Mild (approximate 3 mm) of anterolisthesis of L4 upon L5 without associated pars defects. Degenerative change of the pubic symphysis. Small mesenteric fat containing periumbilical hernia. Minimal amount of subcutaneous edema about the midline of the low back. IMPRESSION: Chest CTA Impression: 1. No acute cardiopulmonary disease. Specifically, no evidence of thoracic aortic aneurysm or dissection. No central pulmonary embolism. No focal airspace opacities to suggest pneumonia. Abdomen and pelvis CTA Impression: 1. No  evidence of abdominal aortic aneurysm or dissection. 2. 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 These findings are chronic in etiology as there is hypertrophy of the IMA which supplies retrograde collateral flow to the SMA vascular distribution. No contrast extravasation or perivascular stranding to suggest an acute inflammatory process. No discrete intraluminal filling defect suggest distal embolism. Correlation with inflammatory markers is recommended. 3. Suspected hepatic steatosis with early cirrhotic change. Correlation with LFTs is recommended. Electronically Signed   By: Sandi Mariscal M.D.   On: 04/03/2016 12:50    Microbiology: No results found for this or any previous visit (from the past 240 hour(s)).   Labs: Basic Metabolic Panel:  Recent Labs Lab 04/03/16 1024 04/04/16 0846 04/05/16 0346  NA 140 140 139  K 3.3* 3.3* 3.8  CL 104 105 106  CO2 22 23 24   GLUCOSE 117* 156* 123*  BUN 12 7 12   CREATININE 0.84 0.82 0.95  CALCIUM 9.8 9.4 9.1  MG  --  1.7 2.0   Liver Function Tests:  Recent Labs Lab 04/03/16 1025 04/04/16 0846  AST 29 21  ALT 21 17  ALKPHOS 94 84  BILITOT 0.9 0.8  PROT 7.6 6.6  ALBUMIN 4.0 3.3*    Recent Labs Lab 04/03/16 1025  LIPASE 27   No results for input(s): AMMONIA in the last 168 hours. CBC:  Recent Labs Lab 04/03/16 1024 04/04/16 0846  WBC 8.3 10.4  HGB 13.5 11.7*  HCT 43.1 38.7  MCV 84.0 84.3  PLT 284 249   Cardiac Enzymes:  Recent Labs Lab 04/03/16  1630 04/03/16 1829 04/03/16 2056 04/04/16 0011  TROPONINI <0.03 <0.03 <0.03 <0.03   BNP: BNP (last 3 results) No results for input(s): BNP in the last 8760 hours.  ProBNP (last 3 results) No results for input(s): PROBNP in the last 8760 hours.  CBG:  Recent Labs Lab  04/04/16 0631 04/04/16 1111 04/04/16 1632 04/04/16 2125 04/05/16 0609  GLUCAP 125* 90 103* 131* 113*       Signed:  THOMPSON,DANIEL MD.  [facility] 04/05/2016, 11:22 AM

## 2016-04-06 LAB — MPO/PR-3 (ANCA) ANTIBODIES: Myeloperoxidase Abs: <9 U/mL (ref 0.0–9.0)

## 2016-04-06 LAB — ANTINUCLEAR ANTIBODIES, IFA: ANTINUCLEAR ANTIBODIES, IFA: NEGATIVE

## 2016-04-07 LAB — ANCA TITERS
Atypical P-ANCA titer: <1:20 {titer}
C-ANCA: <1:20 {titer}
P-ANCA: <1:20 {titer}

## 2016-04-08 ENCOUNTER — Encounter: Payer: Self-pay | Admitting: Internal Medicine

## 2016-04-08 ENCOUNTER — Ambulatory Visit (INDEPENDENT_AMBULATORY_CARE_PROVIDER_SITE_OTHER): Payer: Self-pay | Admitting: Internal Medicine

## 2016-04-08 VITALS — BP 110/70 | HR 102 | Temp 97.2°F | Resp 16 | Ht 62.5 in | Wt 195.2 lb

## 2016-04-08 DIAGNOSIS — I776 Arteritis, unspecified: Secondary | ICD-10-CM

## 2016-04-08 DIAGNOSIS — R079 Chest pain, unspecified: Secondary | ICD-10-CM

## 2016-04-08 NOTE — Progress Notes (Signed)
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 Vasculitis ([place]) -rheumatological testing is negative -no blockages noted on CTA -currently symptom free -will not pursue further action at this time   -unclear whether this is actually the cause of chest pain -patient self pay until November -will try to avoid rheumatology visit until patient able to pay for visit.      Over 40 minutes of exam, counseling, chart review, and complex, high/moderate level critical decision making was performed this visit.   HPI 65 y.o.female presents for follow up from the hospital. Admit date to the hospital was 04/03/16, patient was discharged from the hospital on 04/05/16 and our office contacted the office the day after discharge to set up a follow up appointment, patient was admitted for: Chest pain which radiated to her back.   She reports that the pain started at night time and radiated to her back.  She reported that she took several tums and that didn't improve.  She was admitted to the hospital and had negative troponins.  She did have a CT angiogram which was for a possible dissection and at that time she was told that she had a lot of swelling in the vessels of her stomach.  She was given a steroid and after the steroid she felt she was doing a lot better.  She was seen by vascular surgery and they noted that this has been worsening on imaging since early in 2011.  She reports that her back is not bothering her anymore as well.  She reports that taking tramadol does help to improve her back pain.  She reports that she has been taking it easy.  She is avoiding walking right now.  No CP, SOB, wheezing, abdominal pain, GERD, diarrhea, constipation, diarrhea, melena, hematochezia.     Images while in the hospital Dg Chest 2 View  04/03/2016  CLINICAL DATA:  Chest pain, shortness of breath. EXAM:  CHEST  2 VIEW COMPARISON:  March 05, 2015. FINDINGS: The heart size and mediastinal contours are within normal limits. Both lungs are clear. No pneumothorax or pleural effusion is noted. The visualized skeletal structures are unremarkable. IMPRESSION: No active cardiopulmonary disease. Electronically Signed   By: Marijo Conception, M.D.   On: 04/03/2016 10:24   Ct Angio Chest/abd/pel For Dissection W And/or W/wo  04/03/2016  CLINICAL DATA:  Chest pain beginning around midnight radiating to the back. Evaluate for dissection. EXAM: CT ANGIOGRAPHY CHEST, ABDOMEN AND PELVIS TECHNIQUE: Multidetector CT imaging through the chest, abdomen and pelvis was performed using the standard protocol during bolus administration of intravenous contrast. Multiplanar reconstructed images and MIPs were obtained and reviewed to evaluate the vascular anatomy. CONTRAST:  100 cc Isovue 370 COMPARISON:  Chest radiograph - 04/03/2016 FINDINGS: Vascular Findings of the chest: No evidence of thoracic aortic aneurysm or dissection with measurements as follows. Review of the precontrast images are negative for the presence of an intramural hematoma. Normal heart size.  No pericardial effusion. Although this examination was not tailored for the evaluation the pulmonary arteries, there are no discrete filling defects within the central pulmonary arterial tree to suggest central pulmonary embolism. Normal caliber the main pulmonary artery. ------------------------------------------------------------- Thoracic aortic measurements: Sinotubular junction 27 mm as measured in greatest oblique coronal dimension. Proximal ascending aorta 28 mm as measured in greatest oblique axial dimension at the level of the  main pulmonary artery (axial mage 45, series 4) and approximately 29 mm in greatest oblique coronal diameter (coronal image 82, series 7). Aortic arch aorta 25 mm as measured in greatest oblique sagittal dimension. Proximal descending thoracic aorta  21 mm as measured in greatest oblique axial dimension at the level of the main pulmonary artery. Distal descending thoracic aorta 21 mm as measured in greatest oblique axial dimension at the level of the diaphragmatic hiatus. Review of the MIP images confirms the above findings. ------------------------------------------------------------- Non-Vascular Findings of the chest: Evaluation the pulmonary parenchyma is degraded secondary to patient respiratory artifact. There is rather extensive subpleural ground-glass atelectasis. No discrete focal airspace opacities. No pleural effusion or pneumothorax. The central pulmonary airways are widely patent. No discrete pulmonary nodules given limitation of the examination. No bulky mediastinal or hilar adenopathy. Bilateral axillary lymph nodes are prominent though symmetrically sized with index left axillary lymph node measuring 0.7 cm in greatest short axis diameter (image 42, series 4 and index right axillary lymph node measuring 0.8 cm (image 48, series 4). No acute or aggressive osseous abnormalities within the chest. Regional soft tissues appear normal. Normal appearance of the thyroid gland. --------------------------------------------------------------- Vascular Findings of the abdomen and pelvis: Abdominal aorta: There is no significant atherosclerotic plaque within the normal caliber abdominal aorta. No abdominal aortic dissection or periaortic stranding. Celiac artery: There is a beaded irregularity involving the origin of the celiac artery resulting in short segment severe (greater 70%) luminal narrowing (image sagittal image 95, series 8). Conventional branching pattern. Additionally, there is beaded irregularity involving the common hepatic artery (axial image 116, series 4). The splenic artery appears patent and without a hemodynamically significant stenosis. SMA: Similar to the celiac, there is beaded irregularity involving the origin the SMA resulting  short-segment severe (current 70%) luminal narrowing (sagittal image 96, series 8). Additionally, there is beaded irregularity and narrowing throughout the main trunk of the SMA (representative sagittal image 91, series 8). Conventional branching pattern. The distal tributaries of the SMA appear patent without discrete intraluminal filling defect to suggest distal embolism. Right Renal artery: Solitary; widely patent without hemodynamically significant narrowing. No vessel irregularity to suggest FMD. Left Renal artery: Solitary; widely patent without hemodynamically significant narrowing. No vessel irregularity to suggest FMD. IMA: Widely patent throughout its imaged course. Appears hypertrophy with suspected retrograde supply to the SMA vascular distribution. Pelvic vasculature: The bilateral common, external and internal iliac arteries are all of normal caliber and widely patent without hemodynamically significant narrowing. No vessel irregularity. Review of the MIP images confirms the above findings. -------------------------------------------------------------------------------- Nonvascular Findings of the abdomen and pelvis: Review of the abdominal organs is limited to the arterial phase of enhancement. There is mild nodularity of the hepatic representative image 115, series 4). There is mild diffuse decreased attenuation of the hepatic parenchyma on this postcontrast examination suggestive of hepatic steatosis. No discrete hyper enhancing hepatic lesions. Normal appearance of the gallbladder given degree distention. No radiopaque gallstones. No intra or extrahepatic biliary duct dilatation. No ascites. There is symmetric enhancement of the bilateral kidneys. No definite renal stones on this postcontrast examination. No discrete renal lesions. No urine obstruction or perinephric stranding. Normal appearance of the bilateral adrenal glands. Normal appearance of the urinary bladder given degree distention.  Normal appearance of the pancreas and spleen. Scattered colonic diverticulosis without evidence of diverticulitis. Moderate colonic stool burden without evidence of enteric obstruction. The bowel is otherwise normal in course and caliber without discrete area of wall thickening or evidence of  enteric obstruction. Normal appearance of the terminal ileum and appendix. No pneumoperitoneum, pneumatosis or portal venous gas. Scattered retroperitoneal mesenteric lymph nodes are numerous though individually not enlarged by size criteria presumably reactive due to patient body habitus. No bulky retroperitoneal, mesenteric, pelvic or inguinal lymphadenopathy. Post hysterectomy. No discrete adnexal lesion. No free fluid in the pelvic cul-de-sac. No acute or aggressive osseous abnormalities within the abdomen or pelvis. Mild (approximate 3 mm) of anterolisthesis of L4 upon L5 without associated pars defects. Degenerative change of the pubic symphysis. Small mesenteric fat containing periumbilical hernia. Minimal amount of subcutaneous edema about the midline of the low back. IMPRESSION: Chest CTA Impression: 1. No acute cardiopulmonary disease. Specifically, no evidence of thoracic aortic aneurysm or dissection. No central pulmonary embolism. No focal airspace opacities to suggest pneumonia. Abdomen and pelvis CTA Impression: 1. No evidence of abdominal aortic aneurysm or dissection. 2. 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 These findings are chronic in etiology as there is hypertrophy of the IMA which supplies retrograde collateral flow to the SMA vascular distribution. No contrast extravasation or perivascular stranding to suggest an acute inflammatory process. No discrete intraluminal filling defect suggest distal embolism.  Correlation with inflammatory markers is recommended. 3. Suspected hepatic steatosis with early cirrhotic change. Correlation with LFTs is recommended. Electronically Signed   By: Sandi Mariscal M.D.   On: 04/03/2016 12:50    Past Medical History  Diagnosis Date  . Multiple allergies   . GERD (gastroesophageal reflux disease)   . Bilateral carpal tunnel syndrome   . Wears glasses   . Wears dentures     upper  . Positive PPD     "they told me it wasn't positive; I was allergic to the test itself"  . Hypertension   . Hyperlipidemia   . Vitamin deficiency   . Asthma   . Pneumonia 03/2012  . Type II diabetes mellitus ([place])   . Anemia   . Arthritis     "right hand, back" (04/03/2016)  . Chronic lower back pain      Allergies  Allergen Reactions  . Metronidazole Other (See Comments)    unknown  . 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 calcium carbonate (CALCIUM 600) 600 MG TABS tablet Take 600 mg by mouth daily.    . Cholecalciferol (VITAMIN D3) 5000 UNITS TABS Take 5,000 Units by mouth daily.    . cyanocobalamin 100 MCG tablet Take 100 mcg by mouth daily.    . fexofenadine (ALLEGRA) 180 MG tablet Take 1 tablet (180 mg total) by mouth daily. 90 tablet 6  . fluticasone-salmeterol (ADVAIR HFA) 115-21 MCG/ACT inhaler Inhale 2 puffs into the lungs 2 (two) times daily. 1 Inhaler 6  . hydrochlorothiazide (HYDRODIURIL) 25 MG tablet TAKE 1 TABLET BY MOUTH EVERY DAY 90 tablet 0  . MAGNESIUM PO Take 500 mg by mouth daily.     . methocarbamol (ROBAXIN) 500 MG tablet Take 1 tablet (500 mg total) by mouth every 8 (eight) hours as needed for muscle spasms. 20 tablet 0  . RABEprazole (ACIPHEX) 20 MG tablet Take 1 tablet (20 mg total) by mouth daily. 90 tablet 3  .  Red Yeast Rice Extract (RED YEAST RICE PO) Take 2,000 mg by mouth daily.    .  traMADol (ULTRAM) 50 MG tablet Take 1 tablet (50 mg total) by mouth every 6 (six) hours as needed for moderate pain. 30 tablet    No current facility-administered medications on file prior to visit.    ROS: all negative except above.   Physical Exam: Filed Weights   04/08/16 1546  Weight: 195 lb 3.2 oz (88.542 kg)   BP 110/70 mmHg  Pulse 102  Temp(Src) 97.2 F (36.2 C) (Temporal)  Resp 16  Ht 5' 2.5" (1.588 m)  Wt 195 lb 3.2 oz (88.542 kg)  BMI 35.11 kg/m2  SpO2 96% General Appearance: Well nourished, in no apparent distress. Eyes: PERRLA, EOMs, conjunctiva no swelling or erythema Sinuses: No Frontal/maxillary tenderness ENT/Mouth: Ext aud canals clear, TMs without erythema, bulging. No erythema, swelling, or exudate on post pharynx.  Tonsils not swollen or erythematous. Hearing normal.  Neck: Supple, thyroid normal.  Respiratory: Respiratory effort normal, BS equal bilaterally without rales, rhonchi, wheezing or stridor.  Cardio: RRR with no MRGs. Brisk peripheral pulses without edema.  Abdomen: Soft, + BS.  Non tender, no guarding, rebound, hernias, masses. Lymphatics: Non tender without lymphadenopathy.  Musculoskeletal: Full ROM, 5/5 strength, normal gait.  Skin: Warm, dry without rashes, lesions, ecchymosis.  Neuro: Cranial nerves intact. Normal muscle tone, no cerebellar symptoms. Sensation intact.  Psych: Awake and oriented X 3, normal affect, Insight and Judgment appropriate.     Starlyn Skeans, PA-C 3:55 PM [facility]

## 2016-07-09 ENCOUNTER — Encounter: Payer: Self-pay | Admitting: Internal Medicine

## 2016-07-10 ENCOUNTER — Ambulatory Visit: Payer: Self-pay | Admitting: Internal Medicine

## 2016-07-20 ENCOUNTER — Encounter: Payer: Self-pay | Admitting: Internal Medicine

## 2016-07-20 ENCOUNTER — Ambulatory Visit (INDEPENDENT_AMBULATORY_CARE_PROVIDER_SITE_OTHER): Payer: Self-pay | Admitting: Internal Medicine

## 2016-07-20 VITALS — BP 112/64 | HR 82 | Temp 98.0°F | Resp 16 | Ht 62.5 in | Wt 200.0 lb

## 2016-07-20 DIAGNOSIS — N951 Menopausal and female climacteric states: Secondary | ICD-10-CM

## 2016-07-20 DIAGNOSIS — Z79899 Other long term (current) drug therapy: Secondary | ICD-10-CM

## 2016-07-20 DIAGNOSIS — F4321 Adjustment disorder with depressed mood: Secondary | ICD-10-CM

## 2016-07-20 DIAGNOSIS — R232 Flushing: Secondary | ICD-10-CM

## 2016-07-20 DIAGNOSIS — E782 Mixed hyperlipidemia: Secondary | ICD-10-CM

## 2016-07-20 DIAGNOSIS — I10 Essential (primary) hypertension: Secondary | ICD-10-CM

## 2016-07-20 DIAGNOSIS — E559 Vitamin D deficiency, unspecified: Secondary | ICD-10-CM

## 2016-07-20 DIAGNOSIS — E119 Type 2 diabetes mellitus without complications: Secondary | ICD-10-CM

## 2016-07-20 NOTE — Progress Notes (Signed)
Assessment and Plan:  Hypertension:  -Continue medication -monitor blood pressure at home. -Continue DASH diet -Reminder to go to the ER if any CP, SOB, nausea, dizziness, severe HA, changes vision/speech, left arm numbness and tingling and jaw pain.  Cholesterol - Continue diet and exercise  PreDiabetes without complications -Continue diet and exercise.   Vitamin D Def -continue medications.   Hot Flashes -brisdelle/paroxitine offered -patient contemplating this  Situational Depression -currently unhappy with marriage.  She and her husband have no intimate relationship because of trust issues.   -counseling information given.    Labs deferred as patient is still dealing with hospital bills from recent vasculitis hospitalization.  Will be on medicare in November.    Continue diet and meds as discussed. Further disposition pending results of labs. Discussed med's effects and SE's.    HPI 65 y.o. female  presents for 3 month follow up with hypertension, hyperlipidemia, diabetes and vitamin D deficiency.   Her blood pressure has been controlled at home, today their BP is BP: 112/64.She does not workout. She denies chest pain, shortness of breath, dizziness.   She is on cholesterol medication and denies myalgias. Her cholesterol is at goal. The cholesterol was:  No results found for requested labs within last 8760 hours.   She has been working on diet and exercise for diabetes without complications, she is on bASA, she is not on ACE/ARB, and denies  foot ulcerations, hyperglycemia, hypoglycemia , increased appetite, nausea, paresthesia of the feet, polydipsia, polyuria, visual disturbances and vomiting. Last A1C was: 02/04/2016: Hgb A1c MFr Bld 6.3   Patient is on Vitamin D supplement. No results found for requested labs within last 8760 hours.  Patient reports that she is having debilitating hot flashes.  They do seem to be improving however, she still is bothered a lot by them.     She is also having an unhappy marriage.  She reports that in 2013 she found viagra and she had STD testing done.  She thought that she had active syphillis but was not found to have an active infection.  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  She reports that she and her husband have not been intimate since then.    Current Medications:  Current Outpatient Prescriptions on File Prior to Visit  Medication Sig Dispense Refill  . calcium carbonate (CALCIUM 600) 600 MG TABS tablet Take 600 mg by mouth daily.    . Cholecalciferol (VITAMIN D3) 5000 UNITS TABS Take 5,000 Units by mouth daily.    . cyanocobalamin 100 MCG tablet Take 100 mcg by mouth daily.    . fexofenadine (ALLEGRA) 180 MG tablet Take 1 tablet (180 mg total) by mouth daily. 90 tablet 6  . fluticasone-salmeterol (ADVAIR HFA) 115-21 MCG/ACT inhaler Inhale 2 puffs into the lungs 2 (two) times daily. 1 Inhaler 6  . hydrochlorothiazide (HYDRODIURIL) 25 MG tablet TAKE 1 TABLET BY MOUTH EVERY DAY 90 tablet 0  . MAGNESIUM PO Take 500 mg by mouth daily.     . RABEprazole (ACIPHEX) 20 MG tablet Take 1 tablet (20 mg total) by mouth daily. 90 tablet 3  . Red Yeast Rice Extract (RED YEAST RICE PO) Take 2,000 mg by mouth daily.     No current facility-administered medications on file prior to visit.    Medical History:  Past Medical History:  Diagnosis Date  . Anemia   . Arthritis    "right hand, back" (04/03/2016)  .  Asthma   . Bilateral carpal tunnel syndrome   . Chronic lower back pain   . GERD (gastroesophageal reflux disease)   . Hyperlipidemia   . Hypertension   . Multiple allergies   . Pneumonia 03/2012  . Positive PPD    "they told me it wasn't positive; I was allergic to the test itself"  . Type II diabetes mellitus ([place])   . Vitamin deficiency   . Wears dentures    upper  . Wears glasses    Allergies:  Allergies  Allergen Reactions  . Metronidazole Other (See Comments)    unknown  . Ppd  [Tuberculin Purified Protein Derivative] Other (See Comments)    + PPD 2013 with NEG CXR 05/2012     Review of Systems:  Review of Systems  Constitutional: Negative for chills, fever and malaise/fatigue.  HENT: Negative for congestion, ear pain and sore throat.   Eyes: Negative.   Respiratory: Negative for cough, shortness of breath and wheezing.   Cardiovascular: Negative for chest pain, palpitations and leg swelling.  Gastrointestinal: Negative for abdominal pain, blood in stool, constipation, diarrhea, heartburn and melena.  Genitourinary: Negative.   Skin: Negative.   Neurological: Negative for dizziness, sensory change, loss of consciousness and headaches.  Psychiatric/Behavioral: Negative for depression. The patient is not nervous/anxious and does not have insomnia.     Family history- Review and unchanged  Social history- Review and unchanged  Physical Exam: BP 112/64   Pulse 82   Temp 98 F (36.7 C) (Temporal)   Resp 16   Ht 5' 2.5" (1.588 m)   Wt 200 lb (90.7 kg)   BMI 36.00 kg/m  Wt Readings from Last 3 Encounters:  07/20/16 200 lb (90.7 kg)  04/08/16 195 lb 3.2 oz (88.5 kg)  04/03/16 195 lb 1.6 oz (88.5 kg)   General Appearance: Well nourished well developed, non-toxic appearing, in no apparent distress. Eyes: PERRLA, EOMs, conjunctiva no swelling or erythema ENT/Mouth: Ear canals clear with no erythema, swelling, or discharge.  TMs normal bilaterally, oropharynx clear, moist, with no exudate.   Neck: Supple, thyroid normal, no JVD, no cervical adenopathy.  Respiratory: Respiratory effort normal, breath sounds clear A&P, no wheeze, rhonchi or rales noted.  No retractions, no accessory muscle usage Cardio: RRR with no MRGs. No noted edema.  Abdomen: Soft, + BS.  Non tender, no guarding, rebound, hernias, masses. Musculoskeletal: Full ROM, 5/5 strength, Normal gait Skin: Warm, dry without rashes, lesions, ecchymosis.  Neuro: Awake and oriented X 3, Cranial nerves  intact. No cerebellar symptoms.  Psych: normal affect, Insight and Judgment appropriate.    Starlyn Skeans, PA-C 11:14 AM [facility]

## 2016-07-20 NOTE — Patient Instructions (Signed)
Paroxetine capsules What is this medicine? PAROXETINE (pa ROX e teen) is used to treat hot flashes due to menopause. This medicine may be used for other purposes; ask your health care provider or pharmacist if you have questions. What should I tell my health care provider before I take this medicine? They need to know if you have any of these conditions: -bleeding disorders -glaucoma -heart disease -kidney disease -liver disease -low levels of sodium in the blood -mania or bipolar disorder -seizures -suicidal thoughts, plans, or attempt; a previous suicide attempt by you or a family member -take MAOIs like Carbex, Eldepryl, Marplan, Nardil, and Parnate -take medicines that treat or prevent blood clots -an unusual or allergic reaction to paroxetine, other medicines, foods, dyes, or preservatives -pregnant or trying to get pregnant -breast-feeding How should I use this medicine? Take this medicine by mouth once daily at bedtime. Follow the directions on the prescription label. This medicine can be taken with or without food. Take your medicine at regular intervals. Do not take your medicine more often than directed. A special MedGuide will be given to you by the pharmacist with each prescription and refill. Be sure to read this information carefully each time. Overdosage: If you think you have taken too much of this medicine contact a poison control center or emergency room at once. NOTE: This medicine is only for you. Do not share this medicine with others. What if I miss a dose? If you miss a dose, take it as soon as you can. If it is almost time for your next dose, take only that dose. Do not take double or extra doses. What may interact with this medicine? Do not take this medicine with any of the following medications: -linezolid -MAOIs like Carbex, Eldepryl, Marplan, Nardil, and Parnate -methylene blue (injected into a vein) -pimozide -thioridazine This medicine may also  interact with the following medications: -alcohol -aspirin and aspirin-like medicines -atomoxetine -certain medicines for depression, anxiety, or psychotic disturbances -certain medicines for irregular heart beat like propafenone, flecainide, encainide, and quinidine -certain medicines for migraine headache like almotriptan, eletriptan, frovatriptan, naratriptan, rizatriptan, sumatriptan, zolmitriptan -cimetidine -digoxin -diuretics -fentanyl -fosamprenavir -furazolidone -isoniazid -lithium -medicines that treat or prevent blood clots like warfarin, enoxaparin, and dalteparin -medicines for sleep -NSAIDs, medicines for pain and inflammation, like ibuprofen or naproxen -phenobarbital -phenytoin -procarbazine -rasagiline -ritonavir -supplements like St. John's wort, kava kava, valerian -tamoxifen -tramadol -tryptophan This list may not describe all possible interactions. Give your health care provider a list of all the medicines, herbs, non-prescription drugs, or dietary supplements you use. Also tell them if you smoke, drink alcohol, or use illegal drugs. Some items may interact with your medicine. What should I watch for while using this medicine? Tell your doctor or healthcare professional if your symptoms do not start to get better or if they get worse. Visit your doctor or health care professional for regular checks on your progress. Patients and their families should watch out for new or worsening thoughts of suicide or depression. Also watch out for sudden changes in feelings such as feeling anxious, agitated, panicky, irritable, hostile, aggressive, impulsive, severely restless, overly excited and hyperactive, or not being able to sleep. If this happens, especially at the beginning of treatment or after a change in dose, call your health care professional. Dennis Bast may get drowsy or dizzy. Do not drive, use machinery, or do anything that needs mental alertness until you know how this  medicine affects you. Do not stand or sit up  quickly, especially if you are an older patient. This reduces the risk of dizzy or fainting spells. Alcohol may interfere with the effect of this medicine. Avoid alcoholic drinks. Your mouth may get dry. Chewing sugarless gum, sucking hard candy and drinking plenty of water will help. Contact your doctor if the problem does not go away or is severe. Women should inform their doctor if they wish to become pregnant or think they might be pregnant. There is a potential for serious side effects to an unborn child. Talk to your health care professional or pharmacist for more information. Do not become pregnant while taking this medicine. What side effects may I notice from receiving this medicine? Side effects that you should report to your doctor or health care professional as soon as possible: -allergic reactions like skin rash, itching or hives, swelling of the face, lips, or tongue -changes in emotions or moods -confusion -depression -feeling faint or lightheaded, falls -seizures -suicidal thoughts or actions -unusual bleeding or bruising -unusually weak or tired -weakness Side effects that usually do not require medical attention (Report these to your doctor or health care professional if they continue or are bothersome.): -change in sex drive or performance -fatigue -drowsiness -headache -insomnia -nausea/vomiting -upset stomach This list may not describe all possible side effects. Call your doctor for medical advice about side effects. You may report side effects to FDA at 1-800-FDA-1088. Where should I keep my medicine? Keep out of the reach of children. Store at room temperature between 20 and 25 degrees C (68 and 77 degrees F). Throw away any unused medicine after the expiration date. NOTE: This sheet is a summary. It may not cover all possible information. If you have questions about this medicine, talk to your doctor, pharmacist, or  health care provider.    2016, Elsevier/Gold Standard. (2012-07-18 12:26:17)

## 2016-10-07 DIAGNOSIS — J3089 Other allergic rhinitis: Secondary | ICD-10-CM | POA: Diagnosis not present

## 2016-10-07 DIAGNOSIS — J301 Allergic rhinitis due to pollen: Secondary | ICD-10-CM | POA: Diagnosis not present

## 2016-10-13 DIAGNOSIS — J3089 Other allergic rhinitis: Secondary | ICD-10-CM | POA: Diagnosis not present

## 2016-10-13 DIAGNOSIS — J301 Allergic rhinitis due to pollen: Secondary | ICD-10-CM | POA: Diagnosis not present

## 2016-10-15 ENCOUNTER — Ambulatory Visit (INDEPENDENT_AMBULATORY_CARE_PROVIDER_SITE_OTHER): Payer: Medicare Other | Admitting: Internal Medicine

## 2016-10-15 ENCOUNTER — Encounter: Payer: Self-pay | Admitting: Internal Medicine

## 2016-10-15 VITALS — BP 122/76 | HR 78 | Temp 98.0°F | Resp 18 | Ht 62.5 in | Wt 200.0 lb

## 2016-10-15 DIAGNOSIS — Z23 Encounter for immunization: Secondary | ICD-10-CM | POA: Diagnosis not present

## 2016-10-15 DIAGNOSIS — E559 Vitamin D deficiency, unspecified: Secondary | ICD-10-CM | POA: Diagnosis not present

## 2016-10-15 DIAGNOSIS — E119 Type 2 diabetes mellitus without complications: Secondary | ICD-10-CM | POA: Diagnosis not present

## 2016-10-15 DIAGNOSIS — I10 Essential (primary) hypertension: Secondary | ICD-10-CM | POA: Diagnosis not present

## 2016-10-15 DIAGNOSIS — E782 Mixed hyperlipidemia: Secondary | ICD-10-CM

## 2016-10-15 DIAGNOSIS — Z79899 Other long term (current) drug therapy: Secondary | ICD-10-CM | POA: Diagnosis not present

## 2016-10-15 DIAGNOSIS — Z1159 Encounter for screening for other viral diseases: Secondary | ICD-10-CM

## 2016-10-15 LAB — HEPATIC FUNCTION PANEL
ALBUMIN: 4.3 g/dL (ref 3.6–5.1)
ALT: 17 U/L (ref 6–29)
AST: 24 U/L (ref 10–35)
Alkaline Phosphatase: 105 U/L (ref 33–130)
BILIRUBIN DIRECT: 0.1 mg/dL (ref <=0.2)
BILIRUBIN TOTAL: 0.6 mg/dL (ref 0.2–1.2)
Indirect Bilirubin: 0.5 mg/dL (ref 0.2–1.2)
Total Protein: 7.4 g/dL (ref 6.1–8.1)

## 2016-10-15 LAB — CBC WITH DIFFERENTIAL/PLATELET
BASOS ABS: 0 {cells}/uL (ref 0–200)
Basophils Relative: 0 %
EOS ABS: 58 {cells}/uL (ref 15–500)
EOS PCT: 1 %
HCT: 41.4 % (ref 35.0–45.0)
Hemoglobin: 12.6 g/dL (ref 11.7–15.5)
LYMPHS PCT: 55 %
Lymphs Abs: 3190 cells/uL (ref 850–3900)
MCH: 26.4 pg — AB (ref 27.0–33.0)
MCHC: 30.4 g/dL — AB (ref 32.0–36.0)
MCV: 86.6 fL (ref 80.0–100.0)
MONOS PCT: 5 %
MPV: 10.8 fL (ref 7.5–12.5)
Monocytes Absolute: 290 cells/uL (ref 200–950)
NEUTROS ABS: 2262 {cells}/uL (ref 1500–7800)
Neutrophils Relative %: 39 %
PLATELETS: 329 10*3/uL (ref 140–400)
RBC: 4.78 MIL/uL (ref 3.80–5.10)
RDW: 14.9 % (ref 11.0–15.0)
WBC: 5.8 10*3/uL (ref 3.8–10.8)

## 2016-10-15 LAB — LIPID PANEL
CHOL/HDL RATIO: 2.4 ratio (ref <5.0)
CHOLESTEROL: 215 mg/dL — AB (ref <200)
HDL: 91 mg/dL (ref >50)
LDL Cholesterol: 111 mg/dL — ABNORMAL HIGH (ref <100)
TRIGLYCERIDES: 63 mg/dL (ref <150)
VLDL: 13 mg/dL (ref <30)

## 2016-10-15 LAB — BASIC METABOLIC PANEL WITH GFR
BUN: 14 mg/dL (ref 7–25)
CALCIUM: 9.7 mg/dL (ref 8.6–10.4)
CO2: 25 mmol/L (ref 20–31)
CREATININE: 0.84 mg/dL (ref 0.50–0.99)
Chloride: 107 mmol/L (ref 98–110)
GFR, EST AFRICAN AMERICAN: 85 mL/min (ref >=60)
GFR, Est Non African American: 74 mL/min (ref >=60)
Glucose, Bld: 92 mg/dL (ref 65–99)
Potassium: 4 mmol/L (ref 3.5–5.3)
SODIUM: 141 mmol/L (ref 135–146)

## 2016-10-15 LAB — TSH: TSH: 1.58 mIU/L

## 2016-10-15 NOTE — Progress Notes (Signed)
Assessment and Plan:  Hypertension:  -Continue medication,  -monitor blood pressure at home.  -Continue DASH diet.   -Reminder to go to the ER if any CP, SOB, nausea, dizziness, severe HA, changes vision/speech, left arm numbness and tingling, and jaw pain.  Cholesterol: -Continue diet and exercise.  -Check cholesterol.   Type 2 diabetes without complications: -Continue diet and exercise.  -Check A1C  Vitamin D Def: -check level -continue medications.   Vasculitis Resolved  Increased stress levels -no current signs of deression -patient warned of depression signs   Continue diet and meds as discussed. Further disposition pending results of labs.  HPI 65 y.o. female  presents for 3 month follow up with hypertension, hyperlipidemia, prediabetes and vitamin D.   Her blood pressure has been controlled at home, today their BP is BP: 122/76.   She does not workout. She denies chest pain, shortness of breath, dizziness.   She is on cholesterol medication and denies myalgias. Her cholesterol is at goal. The cholesterol last visit was:   Lab Results  Component Value Date   CHOL 207 (H) 07/10/2015   HDL 84 07/10/2015   LDLCALC 109 07/10/2015   TRIG 69 07/10/2015   CHOLHDL 2.5 07/10/2015     She has been working on diet and exercise for prediabetes, and denies foot ulcerations, hyperglycemia, hypoglycemia , increased appetite, nausea, paresthesia of the feet, polydipsia, polyuria, visual disturbances, vomiting and weight loss. Last A1C in the office was:  Lab Results  Component Value Date   HGBA1C 6.3 (H) 02/04/2016    Patient is on Vitamin D supplement.  Lab Results  Component Value Date   VD25OH 40 07/10/2015     Patient reports that her niece has been in the ICU for the last 8 weeks.  She was diagnosed with histoplasmosis and had a very severe asthma attack.    She reports that her husband was recently diagnosed with CLL.  He has been having a lot of night sweats and  also has been very fatigued.  She reports that this was probably going on for a long time.    She reports that she has been having bilateral foot pain recently.  She wears her support stockings every day.  She reports that she is has been walking a lot and wearing good shoes.    Current Medications:  Current Outpatient Prescriptions on File Prior to Visit  Medication Sig Dispense Refill  . calcium carbonate (CALCIUM 600) 600 MG TABS tablet Take 600 mg by mouth daily.    . Cholecalciferol (VITAMIN D3) 5000 UNITS TABS Take 5,000 Units by mouth daily.    . cyanocobalamin 100 MCG tablet Take 100 mcg by mouth daily.    . fexofenadine (ALLEGRA) 180 MG tablet Take 1 tablet (180 mg total) by mouth daily. 90 tablet 6  . fluticasone-salmeterol (ADVAIR HFA) 115-21 MCG/ACT inhaler Inhale 2 puffs into the lungs 2 (two) times daily. 1 Inhaler 6  . hydrochlorothiazide (HYDRODIURIL) 25 MG tablet TAKE 1 TABLET BY MOUTH EVERY DAY 90 tablet 0  . MAGNESIUM PO Take 500 mg by mouth daily.     . Omega 3-6-9 Fatty Acids (OMEGA 3-6-9 COMPLEX) CAPS Take by mouth daily.    . RABEprazole (ACIPHEX) 20 MG tablet Take 1 tablet (20 mg total) by mouth daily. 90 tablet 3  . Red Yeast Rice Extract (RED YEAST RICE PO) Take 2,000 mg by mouth daily.     No current facility-administered medications on file prior to visit.  Medical History:  Past Medical History:  Diagnosis Date  . Anemia   . Arthritis    "right hand, back" (04/03/2016)  . Asthma   . Bilateral carpal tunnel syndrome   . Chronic lower back pain   . GERD (gastroesophageal reflux disease)   . Hyperlipidemia   . Hypertension   . Multiple allergies   . Pneumonia 03/2012  . Positive PPD    "they told me it wasn't positive; I was allergic to the test itself"  . Type II diabetes mellitus ([place])   . Vitamin deficiency   . Wears dentures    upper  . Wears glasses     Allergies:  Allergies  Allergen Reactions  . Metronidazole Other (See Comments)     unknown  . 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  HENT: Negative for congestion, ear pain and sore throat.   Eyes: Negative.   Respiratory: Negative for cough, shortness of breath and wheezing.   Cardiovascular: Negative for chest pain, palpitations and leg swelling.  Gastrointestinal: Negative for abdominal pain, blood in stool, constipation, diarrhea, heartburn and melena.  Genitourinary: Negative.   Skin: Negative.   Neurological: Negative for dizziness, sensory change, loss of consciousness and headaches.  Psychiatric/Behavioral: Negative for depression. The patient is not nervous/anxious and does not have insomnia.     Family history- Review and unchanged  Social history- Review and unchanged  Physical Exam: BP 122/76   Pulse 78   Temp 98 F (36.7 C) (Temporal)   Resp 18   Ht 5' 2.5" (1.588 m)   Wt 200 lb (90.7 kg)   BMI 36.00 kg/m  Wt Readings from Last 3 Encounters:  10/15/16 200 lb (90.7 kg)  07/20/16 200 lb (90.7 kg)  04/08/16 195 lb 3.2 oz (88.5 kg)    General Appearance: Well nourished well developed, in no apparent distress. Eyes: PERRLA, EOMs, conjunctiva no swelling or erythema ENT/Mouth: Ear canals normal without obstruction, swelling, erythma, discharge.  TMs normal bilaterally.  Oropharynx moist, clear, without exudate, or postoropharyngeal swelling. Neck: Supple, thyroid normal,no cervical adenopathy  Respiratory: Respiratory effort normal, Breath sounds clear A&P without rhonchi, wheeze, or rale.  No retractions, no accessory usage. Cardio: RRR with no MRGs. Brisk peripheral pulses without edema.  Abdomen: Soft, + BS,  Non tender, no guarding, rebound, hernias, masses. Musculoskeletal: Full ROM, 5/5 strength, Normal gait Skin: Warm, dry without rashes, lesions, ecchymosis.  Neuro:  Awake and oriented X 3, Cranial nerves intact. Normal muscle tone, no cerebellar symptoms. Psych: Normal affect, Insight and Judgment appropriate.    Starlyn Skeans, PA-C 11:16 AM [facility]

## 2016-10-16 LAB — MICROALBUMIN / CREATININE URINE RATIO
Creatinine, Urine: 148 mg/dL (ref 20–320)
MICROALB UR: 0.4 mg/dL
MICROALB/CREAT RATIO: 3 ug/mg{creat} (ref <30)

## 2016-10-16 LAB — HEMOGLOBIN A1C
Hgb A1c MFr Bld: 6.2 % — ABNORMAL HIGH (ref <5.7)
MEAN PLASMA GLUCOSE: 131 mg/dL

## 2016-10-16 LAB — HEPATITIS C ANTIBODY: HCV Ab: NEGATIVE

## 2016-10-19 DIAGNOSIS — J301 Allergic rhinitis due to pollen: Secondary | ICD-10-CM | POA: Diagnosis not present

## 2016-10-19 DIAGNOSIS — J3089 Other allergic rhinitis: Secondary | ICD-10-CM | POA: Diagnosis not present

## 2016-10-27 ENCOUNTER — Other Ambulatory Visit: Payer: Self-pay | Admitting: Internal Medicine

## 2016-10-28 DIAGNOSIS — J3089 Other allergic rhinitis: Secondary | ICD-10-CM | POA: Diagnosis not present

## 2016-10-28 DIAGNOSIS — J301 Allergic rhinitis due to pollen: Secondary | ICD-10-CM | POA: Diagnosis not present

## 2016-11-04 DIAGNOSIS — J301 Allergic rhinitis due to pollen: Secondary | ICD-10-CM | POA: Diagnosis not present

## 2016-11-04 DIAGNOSIS — J3089 Other allergic rhinitis: Secondary | ICD-10-CM | POA: Diagnosis not present

## 2016-11-10 DIAGNOSIS — J3089 Other allergic rhinitis: Secondary | ICD-10-CM | POA: Diagnosis not present

## 2016-11-10 DIAGNOSIS — J301 Allergic rhinitis due to pollen: Secondary | ICD-10-CM | POA: Diagnosis not present

## 2016-11-17 DIAGNOSIS — J3089 Other allergic rhinitis: Secondary | ICD-10-CM | POA: Diagnosis not present

## 2016-11-17 DIAGNOSIS — J301 Allergic rhinitis due to pollen: Secondary | ICD-10-CM | POA: Diagnosis not present

## 2016-11-26 ENCOUNTER — Encounter: Payer: Self-pay | Admitting: Internal Medicine

## 2016-11-26 ENCOUNTER — Ambulatory Visit (INDEPENDENT_AMBULATORY_CARE_PROVIDER_SITE_OTHER): Payer: Medicare Other | Admitting: Internal Medicine

## 2016-11-26 VITALS — BP 136/70 | HR 68 | Temp 97.6°F | Resp 14 | Ht 62.5 in | Wt 196.0 lb

## 2016-11-26 DIAGNOSIS — S0003XA Contusion of scalp, initial encounter: Secondary | ICD-10-CM | POA: Diagnosis not present

## 2016-11-26 DIAGNOSIS — M26622 Arthralgia of left temporomandibular joint: Secondary | ICD-10-CM | POA: Diagnosis not present

## 2016-11-26 MED ORDER — PREDNISONE 20 MG PO TABS: ORAL_TABLET | ORAL | 0 refills | Status: DC

## 2016-11-27 NOTE — Progress Notes (Signed)
Subjective:    Patient ID: Lauren Lopez, female    DOB: 01-Nov-1951, 65 y.o.   MRN: [id number]  HPI  Patient is a very nice 65 yo DBF who present with c/o of sitting oin a chair and a clock was knocked from an overhead shelf striking her head w/o LOC, but she has experienced a HA since. She denies any focal lateralizing neuro sign's or sx's. In add'n, she's c/o pain/tenderness over the left TM jt.   Medication Sig  . CALCIUM 600 MG Take 600 mg by mouth daily.  Marland Kitchen VITAMIN D 5000 UNITS Take 5,000 Units by mouth daily.  . cyanocobalamin 100 MCG tablet Take 100 mcg by mouth daily.  . fexofenadine  180 MG tablet Take 1 tablet (180 mg total) by mouth daily.  Marland Kitchen ADVAIR HFA 115-21  inhaler Inhale 2 puffs into the lungs 2 (two) times daily.  . hydrochlorothiazide  25 MG tablet TAKE 1 TABLET BY MOUTH EVERY DAY  . MAGNESIUM  Take 500 mg by mouth daily.   Ernestine Conrad 3-6-9 FA Take by mouth daily.  . RABEprazole 20 MG tablet TAKE 1 TABLET (20 MG TOTAL) BY MOUTH DAILY.  . Red Yeast Rice Extract  Take 2,000 mg by mouth daily.   Allergies  Allergen Reactions  . Metronidazole Other (See Comments)    unknown  . 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 Anemia   . Arthritis    "right hand, back" (04/03/2016)  . Asthma   . Bilateral carpal tunnel syndrome   . Chronic lower back pain   . GERD (gastroesophageal reflux disease)   . Hyperlipidemia   . Hypertension   . Multiple allergies   . Pneumonia 03/2012  . Positive PPD    "they told me it wasn't positive; I was allergic to the test itself"  . Type II diabetes mellitus ([place])   . Vitamin deficiency   . Wears dentures    upper  . Wears glasses    Past Surgical History:  Procedure Laterality Date  . ABDOMINAL HYSTERECTOMY    . BILATERAL SALPINGOOPHORECTOMY  11/28/2010   open laparoscopy with adhesiolysis also  . CARPAL TUNNEL RELEASE   07/15/2012   Procedure: CARPAL TUNNEL RELEASE;  Surgeon: Cammie Sickle., MD;  Location: [place];  Service: Orthopedics;  Laterality: Left;  . CARPAL TUNNEL RELEASE Right 05/31/2014   Procedure: RIGHT CARPAL TUNNEL RELEASE;  Surgeon: Cammie Sickle, MD;  Location: [place];  Service: Orthopedics;  Laterality: Right;  . COLONOSCOPY    . KNEE ARTHROSCOPY Bilateral 2005-2016   "right-left"  . SHOULDER ARTHROSCOPY WITH ROTATOR CUFF REPAIR AND SUBACROMIAL DECOMPRESSION Right 01/20/2013   Procedure: RIGHT ARTHROSCOPY SHOULDER DEBRIDEMENT LIMITED, ARTHROSCOPY SHOULDER DECOMPRESSION SUBACROMIAL PARTIAL ACROMIOPLASTY WITH CORACOACROMIAL RELEASE, ROTATOR CUFF REPAIR ;  Surgeon: Johnny Bridge, MD;  Location: [place];  Service: Orthopedics;  Laterality: Right;  RIGHT SHOULDER SCOPE DEBRIDEMENT, ACRIOMIOPLASTY, ROTATOR CUFF REPAIR  . TUBAL LIGATION     Review of Systems  10 point systems review negative except as above.    Objective:   Physical Exam  BP 136/70   Pulse 68   Temp 97.6 F (36.4 C)   Resp 14   Ht 5' 2.5" (1.588 m)   Wt 196 lb (88.9 kg)   SpO2 99%   BMI 35.28 kg/m  Tender over LParietal scalp w/o signs of STS, ecchymoses or erythema.   HEENT - Eac's patent. TM's Nl.  (+) tender Left TM Jt. EOM's full. PERRLA. NasoOroPharynx clear. Neck - supple.  Chest - Clear equal BS. Cor - Nl HS. RRR w/o sig M.  MS- FROM w/o deformities. Muscle power, tone and bulk Nl. Gait Nl. Neuro - No obvious Cr N abnormalities. Sensory, motor and Cerebellar functions appear Nl w/o focal abnormalities.    Assessment & Plan:   1. Contusion of scalp, initial encounter  - predniSONE  20 MG tablet; 1 tab 3 x day for 2 days, then 1 tab 2 x day for 2 days, then 1 tab 1 x day for 3 days  Dispense: 13 tablet; Refill: 0  2. Arthralgia of left temporomandibular joint  - predniSONE  20 MG tablet; 1 tab 3 x day for 2 days, then 1 tab 2 x day for 2 days,  then 1 tab 1 x day for 3 days  Dispense: 13 tablet; Refill: 0  - advised try ice packs to scalp and heating pad to Left TM jt.  - discussed meds/SE's and call or return if sx's change.

## 2016-12-01 DIAGNOSIS — J3089 Other allergic rhinitis: Secondary | ICD-10-CM | POA: Diagnosis not present

## 2016-12-01 DIAGNOSIS — J301 Allergic rhinitis due to pollen: Secondary | ICD-10-CM | POA: Diagnosis not present

## 2016-12-08 DIAGNOSIS — J3089 Other allergic rhinitis: Secondary | ICD-10-CM | POA: Diagnosis not present

## 2016-12-08 DIAGNOSIS — J301 Allergic rhinitis due to pollen: Secondary | ICD-10-CM | POA: Diagnosis not present

## 2016-12-12 ENCOUNTER — Other Ambulatory Visit: Payer: Self-pay | Admitting: Internal Medicine

## 2016-12-15 ENCOUNTER — Other Ambulatory Visit: Payer: Self-pay | Admitting: Internal Medicine

## 2016-12-15 DIAGNOSIS — J3089 Other allergic rhinitis: Secondary | ICD-10-CM | POA: Diagnosis not present

## 2016-12-15 DIAGNOSIS — J301 Allergic rhinitis due to pollen: Secondary | ICD-10-CM | POA: Diagnosis not present

## 2016-12-22 DIAGNOSIS — J301 Allergic rhinitis due to pollen: Secondary | ICD-10-CM | POA: Diagnosis not present

## 2016-12-22 DIAGNOSIS — J3089 Other allergic rhinitis: Secondary | ICD-10-CM | POA: Diagnosis not present

## 2016-12-29 DIAGNOSIS — J301 Allergic rhinitis due to pollen: Secondary | ICD-10-CM | POA: Diagnosis not present

## 2016-12-29 DIAGNOSIS — J3089 Other allergic rhinitis: Secondary | ICD-10-CM | POA: Diagnosis not present

## 2017-01-05 DIAGNOSIS — J3089 Other allergic rhinitis: Secondary | ICD-10-CM | POA: Diagnosis not present

## 2017-01-05 DIAGNOSIS — J301 Allergic rhinitis due to pollen: Secondary | ICD-10-CM | POA: Diagnosis not present

## 2017-01-13 DIAGNOSIS — J301 Allergic rhinitis due to pollen: Secondary | ICD-10-CM | POA: Diagnosis not present

## 2017-01-13 DIAGNOSIS — J3089 Other allergic rhinitis: Secondary | ICD-10-CM | POA: Diagnosis not present

## 2017-01-20 DIAGNOSIS — J301 Allergic rhinitis due to pollen: Secondary | ICD-10-CM | POA: Diagnosis not present

## 2017-01-20 DIAGNOSIS — J3089 Other allergic rhinitis: Secondary | ICD-10-CM | POA: Diagnosis not present

## 2017-01-25 DIAGNOSIS — J3089 Other allergic rhinitis: Secondary | ICD-10-CM | POA: Diagnosis not present

## 2017-01-25 DIAGNOSIS — J301 Allergic rhinitis due to pollen: Secondary | ICD-10-CM | POA: Diagnosis not present

## 2017-01-26 DIAGNOSIS — J3089 Other allergic rhinitis: Secondary | ICD-10-CM | POA: Diagnosis not present

## 2017-01-26 DIAGNOSIS — J301 Allergic rhinitis due to pollen: Secondary | ICD-10-CM | POA: Diagnosis not present

## 2017-01-27 DIAGNOSIS — H401131 Primary open-angle glaucoma, bilateral, mild stage: Secondary | ICD-10-CM | POA: Diagnosis not present

## 2017-01-27 DIAGNOSIS — H2513 Age-related nuclear cataract, bilateral: Secondary | ICD-10-CM | POA: Diagnosis not present

## 2017-02-01 DIAGNOSIS — J3089 Other allergic rhinitis: Secondary | ICD-10-CM | POA: Diagnosis not present

## 2017-02-01 DIAGNOSIS — J301 Allergic rhinitis due to pollen: Secondary | ICD-10-CM | POA: Diagnosis not present

## 2017-02-04 ENCOUNTER — Ambulatory Visit (INDEPENDENT_AMBULATORY_CARE_PROVIDER_SITE_OTHER): Payer: Medicare Other | Admitting: Internal Medicine

## 2017-02-04 ENCOUNTER — Encounter: Payer: Self-pay | Admitting: Internal Medicine

## 2017-02-04 VITALS — BP 124/70 | HR 72 | Temp 98.2°F | Resp 16 | Ht 62.5 in | Wt 200.0 lb

## 2017-02-04 DIAGNOSIS — E782 Mixed hyperlipidemia: Secondary | ICD-10-CM

## 2017-02-04 DIAGNOSIS — E119 Type 2 diabetes mellitus without complications: Secondary | ICD-10-CM | POA: Diagnosis not present

## 2017-02-04 DIAGNOSIS — M546 Pain in thoracic spine: Secondary | ICD-10-CM

## 2017-02-04 DIAGNOSIS — Z23 Encounter for immunization: Secondary | ICD-10-CM

## 2017-02-04 DIAGNOSIS — M75101 Unspecified rotator cuff tear or rupture of right shoulder, not specified as traumatic: Secondary | ICD-10-CM

## 2017-02-04 DIAGNOSIS — R310 Gross hematuria: Secondary | ICD-10-CM | POA: Diagnosis not present

## 2017-02-04 DIAGNOSIS — R7303 Prediabetes: Secondary | ICD-10-CM

## 2017-02-04 DIAGNOSIS — K219 Gastro-esophageal reflux disease without esophagitis: Secondary | ICD-10-CM

## 2017-02-04 DIAGNOSIS — J45909 Unspecified asthma, uncomplicated: Secondary | ICD-10-CM

## 2017-02-04 DIAGNOSIS — Z79899 Other long term (current) drug therapy: Secondary | ICD-10-CM | POA: Diagnosis not present

## 2017-02-04 DIAGNOSIS — R6889 Other general symptoms and signs: Secondary | ICD-10-CM

## 2017-02-04 DIAGNOSIS — E2839 Other primary ovarian failure: Secondary | ICD-10-CM

## 2017-02-04 DIAGNOSIS — Z0001 Encounter for general adult medical examination with abnormal findings: Secondary | ICD-10-CM

## 2017-02-04 DIAGNOSIS — E559 Vitamin D deficiency, unspecified: Secondary | ICD-10-CM | POA: Diagnosis not present

## 2017-02-04 DIAGNOSIS — I10 Essential (primary) hypertension: Secondary | ICD-10-CM | POA: Diagnosis not present

## 2017-02-04 DIAGNOSIS — Z0000 Encounter for general adult medical examination without abnormal findings: Secondary | ICD-10-CM

## 2017-02-04 LAB — BASIC METABOLIC PANEL WITH GFR
BUN: 13 mg/dL (ref 7–25)
CHLORIDE: 103 mmol/L (ref 98–110)
CO2: 27 mmol/L (ref 20–31)
Calcium: 9.8 mg/dL (ref 8.6–10.4)
Creat: 0.83 mg/dL (ref 0.50–0.99)
GFR, Est African American: 86 mL/min (ref >=60)
GFR, Est Non African American: 74 mL/min (ref >=60)
GLUCOSE: 99 mg/dL (ref 65–99)
POTASSIUM: 4.6 mmol/L (ref 3.5–5.3)
Sodium: 141 mmol/L (ref 135–146)

## 2017-02-04 LAB — CBC WITH DIFFERENTIAL/PLATELET
BASOS ABS: 0 {cells}/uL (ref 0–200)
BASOS PCT: 0 %
EOS ABS: 128 {cells}/uL (ref 15–500)
Eosinophils Relative: 2 %
HEMATOCRIT: 40.4 % (ref 35.0–45.0)
HEMOGLOBIN: 12.5 g/dL (ref 11.7–15.5)
LYMPHS ABS: 3456 {cells}/uL (ref 850–3900)
Lymphocytes Relative: 54 %
MCH: 26.6 pg — AB (ref 27.0–33.0)
MCHC: 30.9 g/dL — ABNORMAL LOW (ref 32.0–36.0)
MCV: 86 fL (ref 80.0–100.0)
MONO ABS: 384 {cells}/uL (ref 200–950)
MPV: 10.9 fL (ref 7.5–12.5)
Monocytes Relative: 6 %
NEUTROS ABS: 2432 {cells}/uL (ref 1500–7800)
Neutrophils Relative %: 38 %
PLATELETS: 320 10*3/uL (ref 140–400)
RBC: 4.7 MIL/uL (ref 3.80–5.10)
RDW: 14.8 % (ref 11.0–15.0)
WBC: 6.4 10*3/uL (ref 3.8–10.8)

## 2017-02-04 LAB — HEPATIC FUNCTION PANEL
ALBUMIN: 4.2 g/dL (ref 3.6–5.1)
ALK PHOS: 111 U/L (ref 33–130)
ALT: 15 U/L (ref 6–29)
AST: 19 U/L (ref 10–35)
BILIRUBIN TOTAL: 0.6 mg/dL (ref 0.2–1.2)
Bilirubin, Direct: 0.1 mg/dL (ref <=0.2)
Indirect Bilirubin: 0.5 mg/dL (ref 0.2–1.2)
Total Protein: 7.1 g/dL (ref 6.1–8.1)

## 2017-02-04 LAB — LIPID PANEL
Cholesterol: 200 mg/dL — ABNORMAL HIGH (ref <200)
HDL: 75 mg/dL (ref >50)
LDL Cholesterol: 110 mg/dL — ABNORMAL HIGH (ref <100)
Total CHOL/HDL Ratio: 2.7 Ratio (ref <5.0)
Triglycerides: 73 mg/dL (ref <150)
VLDL: 15 mg/dL (ref <30)

## 2017-02-04 LAB — HEMOGLOBIN A1C
HEMOGLOBIN A1C: 6.4 % — AB (ref <5.7)
MEAN PLASMA GLUCOSE: 137 mg/dL

## 2017-02-04 LAB — TSH: TSH: 1.2 m[IU]/L

## 2017-02-04 MED ORDER — OMEPRAZOLE 40 MG PO CPDR: 40.0000 mg | DELAYED_RELEASE_CAPSULE | Freq: Every day | ORAL | 1 refills | Status: DC

## 2017-02-04 MED ORDER — RANITIDINE HCL 300 MG PO TABS: 300.0000 mg | ORAL_TABLET | Freq: Every day | ORAL | 1 refills | Status: DC

## 2017-02-04 MED ORDER — HYDROCHLOROTHIAZIDE 25 MG PO TABS: 25.0000 mg | ORAL_TABLET | Freq: Every day | ORAL | 2 refills | Status: DC

## 2017-02-04 NOTE — Patient Instructions (Signed)
Food Choices for Gastroesophageal Reflux Disease, Adult When you have gastroesophageal reflux disease (GERD), the foods you eat and your eating habits are very important. Choosing the right foods can help ease your discomfort. What guidelines do I need to follow?  Choose fruits, vegetables, whole grains, and low-fat dairy products.  Choose low-fat meat, fish, and poultry.  Limit fats such as oils, salad dressings, butter, nuts, and avocado.  Keep a food diary. This helps you identify foods that cause symptoms.  Avoid foods that cause symptoms. These may be different for everyone.  Eat small meals often instead of 3 large meals a day.  Eat your meals slowly, in a place where you are relaxed.  Limit fried foods.  Cook foods using methods other than frying.  Avoid drinking alcohol.  Avoid drinking large amounts of liquids with your meals.  Avoid bending over or lying down until 2-3 hours after eating. What foods are not recommended? These are some foods and drinks that may make your symptoms worse: Vegetables  Tomatoes. Tomato juice. Tomato and spaghetti sauce. Chili peppers. Onion and garlic. Horseradish. Fruits  Oranges, grapefruit, and lemon (fruit and juice). Meats  High-fat meats, fish, and poultry. This includes hot dogs, ribs, ham, sausage, salami, and bacon. Dairy  Whole milk and chocolate milk. Sour cream. Cream. Butter. Ice cream. Cream cheese. Drinks  Coffee and tea. Bubbly (carbonated) drinks or energy drinks. Condiments  Hot sauce. Barbecue sauce. Sweets/Desserts  Chocolate and cocoa. Donuts. Peppermint and spearmint. Fats and Oils  High-fat foods. This includes French fries and potato chips. Other  Vinegar. Strong spices. This includes black pepper, white pepper, red pepper, cayenne, curry powder, cloves, ginger, and chili powder. The items listed above may not be a complete list of foods and drinks to avoid. Contact your dietitian for more information.    This information is not intended to replace advice given to you by your health care provider. Make sure you discuss any questions you have with your health care provider. Document Released: 05/17/2012 Document Revised: 04/23/2016 Document Reviewed: 09/20/2013 Elsevier Interactive Patient Education  2017 [employer]  

## 2017-02-04 NOTE — Progress Notes (Signed)
WELCOME TO MEDICARE ANNUAL WELLNESS VISIT AND FOLLOW UP Assessment:   1. Estrogen deficiency -due in 3 years after this - DG Bone Density; Future  2. Essential hypertension -cont medications -well controlled -dash diet -monitor at home - TSH  3. Type 2 diabetes mellitus without complication, without long-term current use of insulin ([name]) -no longer diabetic -recommended continued diet and exercise - Hemoglobin A1c  4. Mixed hyperlipidemia -goal of less than 100 for LDL - Lipid panel  5. Vitamin D deficiency -cont Vit D supplement and calcium  6. Medication management  - CBC with Differential/Platelet - BASIC METABOLIC PANEL WITH GFR - Hepatic function panel  7. Gross hematuria -recent infection vs. Possible stone  -if continues may need urological evaluation - Urinalysis, Routine w reflex microscopic - Urine culture  8. Need for prophylactic vaccination against Streptococcus pneumoniae (pneumococcus)  - Pneumococcal polysaccharide vaccine 23-valent greater than or equal to 2yo subcutaneous/IM  9. Uncomplicated asthma, unspecified asthma severity, unspecified whether persistent -not currently symptomatic -uses advair  10. Gastroesophageal reflux disease, esophagitis presence not specified -ranitidine 300 mg BID -omeprazole daily x 1 month  11. Prediabetes -cont diet and exercise  12. Tear of right rotator cuff, unspecified tear extent -follows with ortho  13. Right-sided thoracic back pain, unspecified chronicity -follows with ortho    Over 30 minutes of exam, counseling, chart review, and critical decision making was performed  No future appointments.   Plan:   During the course of the visit the patient was educated and counseled about appropriate screening and preventive services including:    Pneumococcal vaccine   Influenza vaccine  Prevnar 13  Td vaccine  Screening electrocardiogram  Colorectal cancer screening  Diabetes  screening  Glaucoma screening  Nutrition counseling    Subjective:  BRYNNLEY DAYRIT is a 66 y.o. female who presents for Medicare Annual Wellness Visit and 3 month follow up for HTN, hyperlipidemia, prediabetes, and vitamin D Def.   Her blood pressure has been controlled at home, today their BP is BP: 124/70 She does not workout. She denies chest pain, shortness of breath, dizziness.    She is on cholesterol medication and denies myalgias. Her cholesterol is at goal. The cholesterol last visit was:   Lab Results  Component Value Date   CHOL 215 (H) 10/15/2016   HDL 91 10/15/2016   LDLCALC 111 (H) 10/15/2016   TRIG 63 10/15/2016   CHOLHDL 2.4 10/15/2016   She has been working on diet and exercise for prediabetes, and denies foot ulcerations, hyperglycemia, hypoglycemia , increased appetite, nausea, paresthesia of the feet, polydipsia, polyuria, vomiting and weight loss. Last A1C in the office was:  Lab Results  Component Value Date   HGBA1C 6.2 (H) 10/15/2016   Last GFR Lab Results  Component Value Date   GFRNONAA 74 10/15/2016     Lab Results  Component Value Date   GFRAA 85 10/15/2016   Patient is on Vitamin D supplement.   Lab Results  Component Value Date   VD25OH 40 07/10/2015     She reports that she is getting ready to have her cataracts taken care of next month.  She is seeing Dr. Katy Fitch.  She reports that she is excited about getting it done.  She reports that she has been struggling color and clarity as well.    She reports that she has been having some stomach issues.  She reports that she is getting a lot of of heart burn, epigastric pain.  No trouble  swallowing.  She does stay up for a couple hours after eating.  She does get water brash.  She has been taking her husbands stomach medication.    She reports that she has been having lower back pain.  She reports that she had some blood on the tissues.  She reports that drank a lot of water and also the bleeding  went away.  She reports that she is still having some lower back pain.  This has improved.  She has never had kidney stones.  She reports that she had normal CT abdomen and imaging and no evidence of kidney stones.    Medication Review: Current Outpatient Prescriptions on File Prior to Visit  Medication Sig Dispense Refill  . calcium carbonate (CALCIUM 600) 600 MG TABS tablet Take 600 mg by mouth daily.    . Cholecalciferol (VITAMIN D3) 5000 UNITS TABS Take 5,000 Units by mouth daily.    . cyanocobalamin 100 MCG tablet Take 100 mcg by mouth daily.    . fexofenadine (ALLEGRA) 180 MG tablet Take 1 tablet (180 mg total) by mouth daily. 90 tablet 6  . fluticasone-salmeterol (ADVAIR HFA) 115-21 MCG/ACT inhaler Inhale 2 puffs into the lungs 2 (two) times daily. 1 Inhaler 6  . MAGNESIUM PO Take 500 mg by mouth daily.     . Omega 3-6-9 Fatty Acids (OMEGA 3-6-9 COMPLEX) CAPS Take by mouth daily.    . Red Yeast Rice Extract (RED YEAST RICE PO) Take 2,000 mg by mouth daily.     No current facility-administered medications on file prior to visit.     Allergies: Allergies  Allergen Reactions  . Metronidazole Other (See Comments)    unknown  . Ppd [Tuberculin Purified Protein Derivative] Other (See Comments)    + PPD 2013 with NEG CXR 05/2012    Current Problems (verified) has Essential hypertension; Right rotator cuff tear; GERD (gastroesophageal reflux disease); Mixed hyperlipidemia; Vitamin D deficiency; Asthma; T2_NIDDM; Medication management; Vasculitis ([place]); Chest pain; Notalgia; Back pain; and Type 2 diabetes mellitus without complication, without long-term current use of insulin ([place]) on her problem list.  Screening Tests Immunization History  Administered Date(s) Administered  . Influenza, Seasonal, Injecte, Preservative Fre 10/17/2015  . Influenza,inj,quad, With Preservative 10/15/2016  . Pneumococcal Polysaccharide-23 02/04/2017  . Pneumococcal-Unspecified 06/27/2013  . Tdap  04/23/2012  . Zoster 07/31/2015    Preventative care: Last colonoscopy: 2009 Pap: Hysterectomy Mammogram:  2016 DEXA: due for one   Names of Other Physician/Practitioners you currently use: 1. [facility] here for primary care 2. Dr. Katy Fitch, eye doctor, last visit 2018 3. Dr. Mariea Clonts  , dentist, last visit 2018 Patient Care Team: Unk Pinto, MD as PCP - General (Internal Medicine) Theodis Sato, MD (Inactive) as Consulting Physician (Orthopedic Surgery) Monna Fam, MD as Consulting Physician (Ophthalmology) Richmond Campbell, MD as Consulting Physician (Gastroenterology) Cheri Fowler, MD as Consulting Physician (Obstetrics and Gynecology) Gaynelle Arabian, MD as Consulting Physician (Orthopedic Surgery) Marchia Bond, MD as Consulting Physician (Orthopedic Surgery) Mosetta Anis, MD as Referring Physician (Allergy) Larey Dresser, MD as Consulting Physician (Cardiology)  Surgical: She  has a past surgical history that includes Bilateral salpingoophorectomy (11/28/2010); Knee arthroscopy (Bilateral, 2005-2016); Colonoscopy; Carpal tunnel release (07/15/2012); Shoulder arthroscopy with rotator cuff repair and subacromial decompression (Right, 01/20/2013); Abdominal hysterectomy; Carpal tunnel release (Right, 05/31/2014); and Tubal ligation. Family Her family history includes Diabetes in her brother and mother; Heart disease in her brother; Hyperlipidemia in her brother; Hypertension in her father and  mother; Stroke in her mother. Social history  She reports that she has never smoked. She has never used smokeless tobacco. She reports that she does not drink alcohol or use drugs.  MEDICARE WELLNESS OBJECTIVES: Physical activity: Current Exercise Habits: Home exercise routine, Type of exercise: walking, Time (Minutes): 30, Frequency (Times/Week): 4, Weekly Exercise (Minutes/Week): 120, Intensity: Mild Cardiac risk factors: Cardiac Risk Factors  include: advanced age (>70men, >91 women);dyslipidemia;hypertension;sedentary lifestyle;obesity (BMI >30kg/m2) Depression/mood screen:   Depression screen [facility] 2/9 02/04/2017  Decreased Interest 0  Down, Depressed, Hopeless 0  PHQ - 2 Score 0    ADLs:  In your present state of health, do you have any difficulty performing the following activities: 02/04/2017 04/03/2016  Hearing? N N  Vision? Y Y  Difficulty concentrating or making decisions? N N  Walking or climbing stairs? N N  Dressing or bathing? N N  Doing errands, shopping? N N  Preparing Food and eating ? N -  Using the Toilet? N -  In the past six months, have you accidently leaked urine? N -  Do you have problems with loss of bowel control? N -  Managing your Medications? N -  Managing your Finances? N -  Housekeeping or managing your Housekeeping? N -  Some recent data might be hidden     Cognitive Testing  Alert? Yes  Normal Appearance?Yes  Oriented to person? Yes  Place? Yes   Time? Yes  Recall of three objects?  Yes  Can perform simple calculations? Yes  Displays appropriate judgment?Yes  Can read the correct time from a watch face?Yes  EOL planning: Does Patient Have a Medical Advance Directive?: No Would patient like information on creating a medical advance directive?: Yes (MAU/Ambulatory/Procedural Areas - Information given)   Objective:   Today's Vitals   02/04/17 1102  BP: 124/70  Pulse: 72  Resp: 16  Temp: 98.2 F (36.8 C)  TempSrc: Temporal  Weight: 200 lb (90.7 kg)  Height: 5' 2.5" (1.588 m)   Body mass index is 36 kg/m.  General appearance: alert, no distress, WD/WN, female HEENT: normocephalic, sclerae anicteric, TMs pearly, nares patent, no discharge or erythema, pharynx normal Oral cavity: MMM, no lesions Neck: supple, no lymphadenopathy, no thyromegaly, no masses Heart: RRR, normal S1, S2, no murmurs Lungs: CTA bilaterally, no wheezes, rhonchi, or rales Abdomen: +bs, soft, non tender, non  distended, no masses, no hepatomegaly, no splenomegaly Musculoskeletal: nontender, no swelling, no obvious deformity Extremities: no edema, no cyanosis, no clubbing Pulses: 2+ symmetric, upper and lower extremities, normal cap refill Neurological: alert, oriented x 3, CN2-12 intact, strength normal upper extremities and lower extremities, sensation normal throughout, DTRs 2+ throughout, no cerebellar signs, gait normal Psychiatric: normal affect, behavior normal, pleasant   Medicare Attestation I have personally reviewed: The patient's medical and social history Their use of alcohol, tobacco or illicit drugs Their current medications and supplements The patient's functional ability including ADLs,fall risks, home safety risks, cognitive, and hearing and visual impairment Diet and physical activities Evidence for depression or mood disorders  The patient's weight, height, BMI, and visual acuity have been recorded in the chart.  I have made referrals, counseling, and provided education to the patient based on review of the above and I have provided the patient with a written personalized care plan for preventive services.     Starlyn Skeans, PA-C   02/04/2017

## 2017-02-05 ENCOUNTER — Other Ambulatory Visit: Payer: Self-pay | Admitting: Internal Medicine

## 2017-02-05 ENCOUNTER — Telehealth: Payer: Self-pay | Admitting: *Deleted

## 2017-02-05 LAB — URINALYSIS, ROUTINE W REFLEX MICROSCOPIC
Bilirubin Urine: NEGATIVE
GLUCOSE, UA: NEGATIVE
HGB URINE DIPSTICK: NEGATIVE
KETONES UR: NEGATIVE
Leukocytes, UA: NEGATIVE
NITRITE: NEGATIVE
Protein, ur: NEGATIVE
Specific Gravity, Urine: 1.022 (ref 1.001–1.035)
pH: 6.5 (ref 5.0–8.0)

## 2017-02-05 MED ORDER — PREDNISONE 20 MG PO TABS: ORAL_TABLET | ORAL | 0 refills | Status: DC

## 2017-02-05 NOTE — Progress Notes (Signed)
Patient having arm swelling from recent pneumovax 23 injection.  Will send in a prescription to have on file.  She will take 60 mg today.  40 mg for the  Following 4 days.

## 2017-02-05 NOTE — Telephone Encounter (Signed)
Patient called with c/o reaction to Pneumovax given yesterday, 02/04/2017.  Per patient her left arm is red and very swollen at injection site.  Per Starlyn Skeans, PA-C orders, patient was told to take Prednisone 60 mg today and 40 mg times 4 days.  (Patient has Prednisone 20 mg at home already) .  Also advised patient to take otc Benadryl 50 mg q 6 hours per Courtney's orders.  Advised her to call if not change in symptoms or any increase in symptoms.  Patient expressed understanding.

## 2017-02-06 DIAGNOSIS — H2511 Age-related nuclear cataract, right eye: Secondary | ICD-10-CM | POA: Diagnosis not present

## 2017-02-06 LAB — URINE CULTURE: ORGANISM ID, BACTERIA: NO GROWTH

## 2017-02-09 DIAGNOSIS — J3089 Other allergic rhinitis: Secondary | ICD-10-CM | POA: Diagnosis not present

## 2017-02-09 DIAGNOSIS — J301 Allergic rhinitis due to pollen: Secondary | ICD-10-CM | POA: Diagnosis not present

## 2017-02-15 DIAGNOSIS — H2511 Age-related nuclear cataract, right eye: Secondary | ICD-10-CM | POA: Diagnosis not present

## 2017-02-16 DIAGNOSIS — J3089 Other allergic rhinitis: Secondary | ICD-10-CM | POA: Diagnosis not present

## 2017-02-16 DIAGNOSIS — J301 Allergic rhinitis due to pollen: Secondary | ICD-10-CM | POA: Diagnosis not present

## 2017-02-23 DIAGNOSIS — J301 Allergic rhinitis due to pollen: Secondary | ICD-10-CM | POA: Diagnosis not present

## 2017-02-23 DIAGNOSIS — J3089 Other allergic rhinitis: Secondary | ICD-10-CM | POA: Diagnosis not present

## 2017-03-02 DIAGNOSIS — J301 Allergic rhinitis due to pollen: Secondary | ICD-10-CM | POA: Diagnosis not present

## 2017-03-02 DIAGNOSIS — J3089 Other allergic rhinitis: Secondary | ICD-10-CM | POA: Diagnosis not present

## 2017-03-10 DIAGNOSIS — J301 Allergic rhinitis due to pollen: Secondary | ICD-10-CM | POA: Diagnosis not present

## 2017-03-10 DIAGNOSIS — J3089 Other allergic rhinitis: Secondary | ICD-10-CM | POA: Diagnosis not present

## 2017-03-17 DIAGNOSIS — J301 Allergic rhinitis due to pollen: Secondary | ICD-10-CM | POA: Diagnosis not present

## 2017-03-17 DIAGNOSIS — J3089 Other allergic rhinitis: Secondary | ICD-10-CM | POA: Diagnosis not present

## 2017-03-24 DIAGNOSIS — J301 Allergic rhinitis due to pollen: Secondary | ICD-10-CM | POA: Diagnosis not present

## 2017-03-24 DIAGNOSIS — J3089 Other allergic rhinitis: Secondary | ICD-10-CM | POA: Diagnosis not present

## 2017-03-31 DIAGNOSIS — J301 Allergic rhinitis due to pollen: Secondary | ICD-10-CM | POA: Diagnosis not present

## 2017-03-31 DIAGNOSIS — J3089 Other allergic rhinitis: Secondary | ICD-10-CM | POA: Diagnosis not present

## 2017-04-04 DIAGNOSIS — H2512 Age-related nuclear cataract, left eye: Secondary | ICD-10-CM | POA: Diagnosis not present

## 2017-04-05 ENCOUNTER — Other Ambulatory Visit: Payer: Self-pay

## 2017-04-05 MED ORDER — OMEPRAZOLE 40 MG PO CPDR: 40.0000 mg | DELAYED_RELEASE_CAPSULE | Freq: Every day | ORAL | 1 refills | Status: DC

## 2017-04-06 DIAGNOSIS — J3089 Other allergic rhinitis: Secondary | ICD-10-CM | POA: Diagnosis not present

## 2017-04-06 DIAGNOSIS — J301 Allergic rhinitis due to pollen: Secondary | ICD-10-CM | POA: Diagnosis not present

## 2017-04-12 DIAGNOSIS — H2512 Age-related nuclear cataract, left eye: Secondary | ICD-10-CM | POA: Diagnosis not present

## 2017-04-13 DIAGNOSIS — J3089 Other allergic rhinitis: Secondary | ICD-10-CM | POA: Diagnosis not present

## 2017-04-13 DIAGNOSIS — J301 Allergic rhinitis due to pollen: Secondary | ICD-10-CM | POA: Diagnosis not present

## 2017-04-21 DIAGNOSIS — J3089 Other allergic rhinitis: Secondary | ICD-10-CM | POA: Diagnosis not present

## 2017-04-21 DIAGNOSIS — H1045 Other chronic allergic conjunctivitis: Secondary | ICD-10-CM | POA: Diagnosis not present

## 2017-04-21 DIAGNOSIS — J4530 Mild persistent asthma, uncomplicated: Secondary | ICD-10-CM | POA: Diagnosis not present

## 2017-04-21 DIAGNOSIS — J301 Allergic rhinitis due to pollen: Secondary | ICD-10-CM | POA: Diagnosis not present

## 2017-04-29 DIAGNOSIS — J301 Allergic rhinitis due to pollen: Secondary | ICD-10-CM | POA: Diagnosis not present

## 2017-04-29 DIAGNOSIS — J3089 Other allergic rhinitis: Secondary | ICD-10-CM | POA: Diagnosis not present

## 2017-05-04 DIAGNOSIS — J301 Allergic rhinitis due to pollen: Secondary | ICD-10-CM | POA: Diagnosis not present

## 2017-05-04 DIAGNOSIS — J3089 Other allergic rhinitis: Secondary | ICD-10-CM | POA: Diagnosis not present

## 2017-05-12 DIAGNOSIS — J301 Allergic rhinitis due to pollen: Secondary | ICD-10-CM | POA: Diagnosis not present

## 2017-05-12 DIAGNOSIS — J3089 Other allergic rhinitis: Secondary | ICD-10-CM | POA: Diagnosis not present

## 2017-05-19 DIAGNOSIS — J3089 Other allergic rhinitis: Secondary | ICD-10-CM | POA: Diagnosis not present

## 2017-05-19 DIAGNOSIS — J301 Allergic rhinitis due to pollen: Secondary | ICD-10-CM | POA: Diagnosis not present

## 2017-05-20 DIAGNOSIS — H401131 Primary open-angle glaucoma, bilateral, mild stage: Secondary | ICD-10-CM | POA: Diagnosis not present

## 2017-05-25 DIAGNOSIS — M810 Age-related osteoporosis without current pathological fracture: Secondary | ICD-10-CM | POA: Diagnosis not present

## 2017-05-25 DIAGNOSIS — D249 Benign neoplasm of unspecified breast: Secondary | ICD-10-CM | POA: Diagnosis not present

## 2017-05-26 DIAGNOSIS — J3089 Other allergic rhinitis: Secondary | ICD-10-CM | POA: Diagnosis not present

## 2017-05-26 DIAGNOSIS — J301 Allergic rhinitis due to pollen: Secondary | ICD-10-CM | POA: Diagnosis not present

## 2017-05-30 ENCOUNTER — Other Ambulatory Visit: Payer: Self-pay | Admitting: Physician Assistant

## 2017-05-31 DIAGNOSIS — J301 Allergic rhinitis due to pollen: Secondary | ICD-10-CM | POA: Diagnosis not present

## 2017-05-31 DIAGNOSIS — J3089 Other allergic rhinitis: Secondary | ICD-10-CM | POA: Diagnosis not present

## 2017-06-01 ENCOUNTER — Other Ambulatory Visit: Payer: Self-pay | Admitting: Internal Medicine

## 2017-06-01 DIAGNOSIS — M810 Age-related osteoporosis without current pathological fracture: Secondary | ICD-10-CM

## 2017-06-07 DIAGNOSIS — J301 Allergic rhinitis due to pollen: Secondary | ICD-10-CM | POA: Diagnosis not present

## 2017-06-07 DIAGNOSIS — J3089 Other allergic rhinitis: Secondary | ICD-10-CM | POA: Diagnosis not present

## 2017-06-15 DIAGNOSIS — J3089 Other allergic rhinitis: Secondary | ICD-10-CM | POA: Diagnosis not present

## 2017-06-15 DIAGNOSIS — J301 Allergic rhinitis due to pollen: Secondary | ICD-10-CM | POA: Diagnosis not present

## 2017-06-21 ENCOUNTER — Encounter: Payer: Self-pay | Admitting: Internal Medicine

## 2017-06-24 DIAGNOSIS — J3089 Other allergic rhinitis: Secondary | ICD-10-CM | POA: Diagnosis not present

## 2017-06-24 DIAGNOSIS — J301 Allergic rhinitis due to pollen: Secondary | ICD-10-CM | POA: Diagnosis not present

## 2017-06-29 DIAGNOSIS — J3089 Other allergic rhinitis: Secondary | ICD-10-CM | POA: Diagnosis not present

## 2017-06-29 DIAGNOSIS — J301 Allergic rhinitis due to pollen: Secondary | ICD-10-CM | POA: Diagnosis not present

## 2017-07-01 DIAGNOSIS — J301 Allergic rhinitis due to pollen: Secondary | ICD-10-CM | POA: Diagnosis not present

## 2017-07-01 DIAGNOSIS — J3089 Other allergic rhinitis: Secondary | ICD-10-CM | POA: Diagnosis not present

## 2017-07-01 DIAGNOSIS — H401132 Primary open-angle glaucoma, bilateral, moderate stage: Secondary | ICD-10-CM | POA: Diagnosis not present

## 2017-07-07 DIAGNOSIS — J301 Allergic rhinitis due to pollen: Secondary | ICD-10-CM | POA: Diagnosis not present

## 2017-07-07 DIAGNOSIS — J3089 Other allergic rhinitis: Secondary | ICD-10-CM | POA: Diagnosis not present

## 2017-07-08 ENCOUNTER — Ambulatory Visit (INDEPENDENT_AMBULATORY_CARE_PROVIDER_SITE_OTHER): Payer: Medicare Other | Admitting: Physician Assistant

## 2017-07-08 ENCOUNTER — Encounter: Payer: Self-pay | Admitting: Physician Assistant

## 2017-07-08 VITALS — BP 122/88 | HR 77 | Resp 14 | Ht 62.5 in | Wt 199.6 lb

## 2017-07-08 DIAGNOSIS — I10 Essential (primary) hypertension: Secondary | ICD-10-CM | POA: Diagnosis not present

## 2017-07-08 DIAGNOSIS — M25571 Pain in right ankle and joints of right foot: Secondary | ICD-10-CM

## 2017-07-08 DIAGNOSIS — M810 Age-related osteoporosis without current pathological fracture: Secondary | ICD-10-CM

## 2017-07-08 DIAGNOSIS — E782 Mixed hyperlipidemia: Secondary | ICD-10-CM

## 2017-07-08 DIAGNOSIS — Z79899 Other long term (current) drug therapy: Secondary | ICD-10-CM | POA: Diagnosis not present

## 2017-07-08 DIAGNOSIS — M25572 Pain in left ankle and joints of left foot: Secondary | ICD-10-CM

## 2017-07-08 DIAGNOSIS — G8929 Other chronic pain: Secondary | ICD-10-CM

## 2017-07-08 DIAGNOSIS — E119 Type 2 diabetes mellitus without complications: Secondary | ICD-10-CM | POA: Diagnosis not present

## 2017-07-08 DIAGNOSIS — E559 Vitamin D deficiency, unspecified: Secondary | ICD-10-CM | POA: Diagnosis not present

## 2017-07-08 MED ORDER — ALENDRONATE SODIUM 70 MG PO TABS: 70.0000 mg | ORAL_TABLET | ORAL | 11 refills | Status: DC

## 2017-07-08 NOTE — Patient Instructions (Addendum)
GETTING OFF OF PPI's    Nexium/protonix/prilosec/Omeprazole/Dexilant/Aciphex are called PPI's, they are great at healing your stomach but should only be taken for a short period of time.     Recent studies have shown that taken for a long time they  can increase the risk of osteoporosis (weakening of your bones), pneumonia, low magnesium, restless legs, Cdiff (infection that causes diarrhea), DEMENTIA and most recently kidney damage / disease / insufficiency.     Due to this information we want to try to stop the PPI but if you try to stop it abruptly this can cause rebound acid and worsening symptoms.   So this is how we want you to get off the PPI:  - Start taking the nexium/protonix/prilosec/PPI  every other day with  zantac (ranitidine) 2 x a day for 2-4 weeks - some people stay on this dosage and can not taper off further. Our main goal is to limit the dosage and amount you are taking so if you need to stay on this dose.   - then decrease the PPI to every 3 days while taking the zantac (ranitidine) 300mg  twice a day the other  days for 2-4  Weeks  - then you can try the zantac (ranitidine) 300mg  once at night or up to 2 x day as needed.  - you can continue on this once at night or stop all together  - Avoid alcohol, spicy foods, NSAIDS (aleve, ibuprofen) at this time. See foods below.   +++++++++++++++++++++++++++++++++++++++++++  Food Choices for Gastroesophageal Reflux Disease  When you have gastroesophageal reflux disease (GERD), the foods you eat and your eating habits are very important. Choosing the right foods can help ease the discomfort of GERD. WHAT GENERAL GUIDELINES DO I NEED TO FOLLOW?  Choose fruits, vegetables, whole grains, low-fat dairy products, and low-fat meat, fish, and poultry.  Limit fats such as oils, salad dressings, butter, nuts, and avocado.  Keep a food diary to identify foods that cause symptoms.  Avoid foods that cause reflux. These may be  different for different people.  Eat frequent small meals instead of three large meals each day.  Eat your meals slowly, in a relaxed setting.  Limit fried foods.  Cook foods using methods other than frying.  Avoid drinking alcohol.  Avoid drinking large amounts of liquids with your meals.  Avoid bending over or lying down until 2-3 hours after eating.   WHAT FOODS ARE NOT RECOMMENDED? The following are some foods and drinks that may worsen your symptoms:  Vegetables Tomatoes. Tomato juice. Tomato and spaghetti sauce. Chili peppers. Onion and garlic. Horseradish. Fruits Oranges, grapefruit, and lemon (fruit and juice). Meats High-fat meats, fish, and poultry. This includes hot dogs, ribs, ham, sausage, salami, and bacon. Dairy Whole milk and chocolate milk. Sour cream. Cream. Butter. Ice cream. Cream cheese.  Beverages Coffee and tea, with or without caffeine. Carbonated beverages or energy drinks. Condiments Hot sauce. Barbecue sauce.  Sweets/Desserts Chocolate and cocoa. Donuts. Peppermint and spearmint. Fats and Oils High-fat foods, including [place] fries and potato chips. Other Vinegar. Strong spices, such as black pepper, white pepper, red pepper, cayenne, curry powder, cloves, ginger, and chili powder. Nexium/protonix/prilosec are called PPI's, they are great at healing your stomach but should only be taken for a short period of time.   Increase vitamin D by 2000 IU daily   Osteoporosis Osteoporosis is the thinning and loss of density in the bones. Osteoporosis makes the bones more brittle, fragile, and likely to  break (fracture). Over time, osteoporosis can cause the bones to become so weak that they fracture after a simple fall. The bones most likely to fracture are the bones in the hip, wrist, and spine. What are the causes? The exact cause is not known. What increases the risk? Anyone can develop osteoporosis. You may be at greater risk if you have a family  history of the condition or have poor nutrition. You may also have a higher risk if you are:  Female.  32 years old or older.  A smoker.  Not physically active.  White or Asian.  Slender.  What are the signs or symptoms? A fracture might be the first sign of the disease, especially if it results from a fall or injury that would not usually cause a bone to break. Other signs and symptoms include:  Low back and neck pain.  Stooped posture.  Height loss.  How is this diagnosed? To make a diagnosis, your health care provider may:  Take a medical history.  Perform a physical exam.  Order tests, such as: ? A bone mineral density test. ? A dual-energy X-ray absorptiometry test.  How is this treated? The goal of osteoporosis treatment is to strengthen your bones to reduce your risk of a fracture. Treatment may involve:  Making lifestyle changes, such as: ? Eating a diet rich in calcium. ? Doing weight-bearing and muscle-strengthening exercises. ? Stopping tobacco use. ? Limiting alcohol intake.  Taking medicine to slow the process of bone loss or to increase bone density.  Monitoring your levels of calcium and vitamin D.  Follow these instructions at home:  Include calcium and vitamin D in your diet. Calcium is important for bone health, and vitamin D helps the body absorb calcium.  Perform weight-bearing and muscle-strengthening exercises as directed by your health care provider.  Do not use any tobacco products, including cigarettes, chewing tobacco, and electronic cigarettes. If you need help quitting, ask your health care provider.  Limit your alcohol intake.  Take medicines only as directed by your health care provider.  Keep all follow-up visits as directed by your health care provider. This is important.  Take precautions at home to lower your risk of falling, such as: ? Keeping rooms well lit and clutter free. ? Installing safety rails on  stairs. ? Using rubber mats in the bathroom and other areas that are often wet or slippery. Get help right away if: You fall or injure yourself. This information is not intended to replace advice given to you by your health care provider. Make sure you discuss any questions you have with your health care provider. Document Released: 08/26/2005 Document Revised: 04/20/2016 Document Reviewed: 04/26/2014 Elsevier Interactive Patient Education  2017 [employer].  Alendronate tablets What is this medicine? ALENDRONATE (a LEN droe nate) slows calcium loss from bones. It helps to make normal healthy bone and to slow bone loss in people with Paget's disease and osteoporosis. It may be used in others at risk for bone loss. This medicine may be used for other purposes; ask your health care provider or pharmacist if you have questions. COMMON BRAND NAME(S): Fosamax What should I tell my health care provider before I take this medicine? They need to know if you have any of these conditions: -dental disease -esophagus, stomach, or intestine problems, like acid reflux or GERD -kidney disease -low blood calcium -low vitamin D -problems sitting or standing 30 minutes -trouble swallowing -an unusual or allergic reaction to alendronate, other  medicines, foods, dyes, or preservatives -pregnant or trying to get pregnant -breast-feeding How should I use this medicine? You must take this medicine exactly as directed or you will lower the amount of the medicine you absorb into your body or you may cause yourself harm. Take this medicine by mouth first thing in the morning, after you are up for the day. Do not eat or drink anything before you take your medicine. Swallow the tablet with a full glass (6 to 8 fluid ounces) of plain water. Do not take this medicine with any other drink. Do not chew or crush the tablet. After taking this medicine, do not eat breakfast, drink, or take any medicines or vitamins for at  least 30 minutes. Sit or stand up for at least 30 minutes after you take this medicine; do not lie down. Do not take your medicine more often than directed. Talk to your pediatrician regarding the use of this medicine in children. Special care may be needed. Overdosage: If you think you have taken too much of this medicine contact a poison control center or emergency room at once. NOTE: This medicine is only for you. Do not share this medicine with others. What if I miss a dose? If you miss a dose, do not take it later in the day. Continue your normal schedule starting the next morning. Do not take double or extra doses. What may interact with this medicine? -aluminum hydroxide -antacids -aspirin -calcium supplements -drugs for inflammation like ibuprofen, naproxen, and others -iron supplements -magnesium supplements -vitamins with minerals This list may not describe all possible interactions. Give your health care provider a list of all the medicines, herbs, non-prescription drugs, or dietary supplements you use. Also tell them if you smoke, drink alcohol, or use illegal drugs. Some items may interact with your medicine. What should I watch for while using this medicine? Visit your doctor or health care professional for regular checks ups. It may be some time before you see benefit from this medicine. Do not stop taking your medicine except on your doctor's advice. Your doctor or health care professional may order blood tests and other tests to see how you are doing. You should make sure you get enough calcium and vitamin D while you are taking this medicine, unless your doctor tells you not to. Discuss the foods you eat and the vitamins you take with your health care professional. Some people who take this medicine have severe bone, joint, and/or muscle pain. This medicine may also increase your risk for a broken thigh bone. Tell your doctor right away if you have pain in your upper leg or  groin. Tell your doctor if you have any pain that does not go away or that gets worse. This medicine can make you more sensitive to the sun. If you get a rash while taking this medicine, sunlight may cause the rash to get worse. Keep out of the sun. If you cannot avoid being in the sun, wear protective clothing and use sunscreen. Do not use sun lamps or tanning beds/booths. What side effects may I notice from receiving this medicine? Side effects that you should report to your doctor or health care professional as soon as possible: -allergic reactions like skin rash, itching or hives, swelling of the face, lips, or tongue -black or tarry stools -bone, muscle or joint pain -changes in vision -chest pain -heartburn or stomach pain -jaw pain, especially after dental work -pain or trouble when swallowing -redness, blistering, peeling  or loosening of the skin, including inside the mouth Side effects that usually do not require medical attention (report to your doctor or health care professional if they continue or are bothersome): -changes in taste -diarrhea or constipation -eye pain or itching -headache -nausea or vomiting -stomach gas or fullness This list may not describe all possible side effects. Call your doctor for medical advice about side effects. You may report side effects to FDA at 1-800-FDA-1088. Where should I keep my medicine? Keep out of the reach of children. Store at room temperature of 15 and 30 degrees C (59 and 86 degrees F). Throw away any unused medicine after the expiration date. NOTE: This sheet is a summary. It may not cover all possible information. If you have questions about this medicine, talk to your doctor, pharmacist, or health care provider.  2018 Elsevier/Gold Standard (2011-05-15 08:56:09)

## 2017-07-08 NOTE — Progress Notes (Signed)
Subjective:    Patient ID: Lauren Lopez, female    DOB: [birth year], 66 y.o.   MRN: [id number]  HPI 66 y.o. WF with recent DEXA showing new osteoporosis with T score of -2.8 from -1.6 in 2014, here to discuss treatment.   Lab Results  Component Value Date   VD25OH 40 07/10/2015   Her blood pressure has been controlled at home, today their BP is BP: 122/88  She does not workout. She denies chest pain, shortness of breath, dizziness.  She is not on cholesterol medication and denies myalgias. Her cholesterol is not at goal. The cholesterol last visit was:   Lab Results  Component Value Date   CHOL 200 (H) 02/04/2017   HDL 75 02/04/2017   LDLCALC 110 (H) 02/04/2017   TRIG 73 02/04/2017   CHOLHDL 2.7 02/04/2017    She has been working on diet and exercise for diabetes x2016, brought down with diet, and denies paresthesia of the feet, polydipsia, polyuria and visual disturbances. Last A1C in the office was:  Lab Results  Component Value Date   HGBA1C 6.4 (H) 02/04/2017   Lab Results  Component Value Date   GFRAA 86 02/04/2017   Patient is on Vitamin D supplement.   Lab Results  Component Value Date   VD25OH 40 07/10/2015     BMI is Body mass index is 35.93 kg/m., she is working on diet and exercise. Wt Readings from Last 3 Encounters:  07/08/17 199 lb 9.6 oz (90.5 kg)  02/04/17 200 lb (90.7 kg)  11/26/16 196 lb (88.9 kg)    Blood pressure 122/88, pulse 77, resp. rate 14, height 5' 2.5" (1.588 m), weight 199 lb 9.6 oz (90.5 kg), SpO2 99 %.  Medications Current Outpatient Prescriptions on File Prior to Visit  Medication Sig  . calcium carbonate (CALCIUM 600) 600 MG TABS tablet Take 600 mg by mouth daily.  . Cholecalciferol (VITAMIN D3) 5000 UNITS TABS Take 5,000 Units by mouth daily.  . cyanocobalamin 100 MCG tablet Take 100 mcg by mouth daily.  . fexofenadine (ALLEGRA) 180 MG tablet Take 1 tablet (180 mg total) by mouth daily.  . fluticasone-salmeterol (ADVAIR  HFA) 115-21 MCG/ACT inhaler Inhale 2 puffs into the lungs 2 (two) times daily.  . hydrochlorothiazide (HYDRODIURIL) 25 MG tablet Take 1 tablet (25 mg total) by mouth daily.  Marland Kitchen MAGNESIUM PO Take 500 mg by mouth daily.   . Omega 3-6-9 Fatty Acids (OMEGA 3-6-9 COMPLEX) CAPS Take by mouth daily.  Marland Kitchen omeprazole (PRILOSEC) 40 MG capsule TAKE 1 CAPSULE BY MOUTH EVERY DAY  . ranitidine (ZANTAC) 300 MG tablet Take 1 tablet (300 mg total) by mouth at bedtime.  . Red Yeast Rice Extract (RED YEAST RICE PO) Take 2,000 mg by mouth daily.   No current facility-administered medications on file prior to visit.     Problem list She has Essential hypertension; Right rotator cuff tear; GERD (gastroesophageal reflux disease); Mixed hyperlipidemia; Vitamin D deficiency; Asthma; Medication management; Back pain; Prediabetes; and Osteoporosis on her problem list.   Review of Systems  Constitutional: Negative.   HENT: Negative.   Respiratory: Negative.   Cardiovascular: Negative.   Gastrointestinal: Negative.   Genitourinary: Negative.   Musculoskeletal: Positive for arthralgias, back pain and gait problem.  Skin: Negative.   Hematological: Negative.   Psychiatric/Behavioral: Negative.        Objective:   Physical Exam  Constitutional: She is oriented to person, place, and time. She appears well-developed and well-nourished.  HENT:  Head: Normocephalic and atraumatic.  Right Ear: External ear normal.  Left Ear: External ear normal.  Mouth/Throat: Oropharynx is clear and moist.  Eyes: Pupils are equal, round, and reactive to light. Conjunctivae and EOM are normal.  Neck: Normal range of motion. Neck supple. No thyromegaly present.  Cardiovascular: Normal rate, regular rhythm and normal heart sounds.  Exam reveals no gallop and no friction rub.   No murmur heard. Pulmonary/Chest: Effort normal and breath sounds normal. No respiratory distress. She has no wheezes.  Abdominal: Soft. Bowel sounds are  normal. She exhibits no distension and no mass. There is no tenderness. There is no rebound and no guarding.  Obese  Musculoskeletal: Normal range of motion.  Pes planus  Lymphadenopathy:    She has no cervical adenopathy.  Neurological: She is alert and oriented to person, place, and time. She displays normal reflexes. No cranial nerve deficit. Coordination normal.  Skin: Skin is warm and dry.  Psychiatric: She has a normal mood and affect.      Assessment & Plan:    Age related osteoporosis, unspecified pathological fracture presence -     alendronate (FOSAMAX) 70 MG tablet; Take 1 tablet (70 mg total) by mouth every 7 (seven) days. Take with a full glass of water on an empty stomach.  Essential hypertension - continue medications, DASH diet, exercise and monitor at home. Call if greater than 130/80.  -     CBC with Differential/Platelet -     BASIC METABOLIC PANEL WITH GFR -     Hepatic function panel -     TSH  Type 2 diabetes mellitus without complication, without long-term current use of insulin ([name]) Discussed general issues about diabetes pathophysiology and management., Educational material distributed., Suggested low cholesterol diet., Encouraged aerobic exercise., Discussed foot care., Reminded to get yearly retinal exam. -     Lipid panel  Mixed hyperlipidemia -continue medications, check lipids, decrease fatty foods, increase activity.  -     Hemoglobin A1c  Medication management -     Magnesium  Vitamin D deficiency  Chronic pain of both ankles -     Ambulatory referral to Orthopedic Surgery - weight loss advised

## 2017-07-09 LAB — CBC WITH DIFFERENTIAL/PLATELET
Basophils Absolute: 0 cells/uL (ref 0–200)
Basophils Relative: 0 %
Eosinophils Absolute: 72 cells/uL (ref 15–500)
Eosinophils Relative: 1 %
HEMATOCRIT: 41.1 % (ref 35.0–45.0)
Hemoglobin: 12.8 g/dL (ref 11.7–15.5)
LYMPHS PCT: 56 %
Lymphs Abs: 4032 cells/uL — ABNORMAL HIGH (ref 850–3900)
MCH: 26.9 pg — ABNORMAL LOW (ref 27.0–33.0)
MCHC: 31.1 g/dL — AB (ref 32.0–36.0)
MCV: 86.3 fL (ref 80.0–100.0)
MONO ABS: 432 {cells}/uL (ref 200–950)
MONOS PCT: 6 %
MPV: 10.8 fL (ref 7.5–12.5)
NEUTROS PCT: 37 %
Neutro Abs: 2664 cells/uL (ref 1500–7800)
PLATELETS: 332 10*3/uL (ref 140–400)
RBC: 4.76 MIL/uL (ref 3.80–5.10)
RDW: 15.2 % — AB (ref 11.0–15.0)
WBC: 7.2 10*3/uL (ref 3.8–10.8)

## 2017-07-09 LAB — HEPATIC FUNCTION PANEL
ALBUMIN: 4.3 g/dL (ref 3.6–5.1)
ALK PHOS: 126 U/L (ref 33–130)
ALT: 15 U/L (ref 6–29)
AST: 19 U/L (ref 10–35)
BILIRUBIN TOTAL: 0.5 mg/dL (ref 0.2–1.2)
Bilirubin, Direct: 0.1 mg/dL (ref <=0.2)
Indirect Bilirubin: 0.4 mg/dL (ref 0.2–1.2)
Total Protein: 7.1 g/dL (ref 6.1–8.1)

## 2017-07-09 LAB — HEMOGLOBIN A1C
HEMOGLOBIN A1C: 6.5 % — AB (ref <5.7)
MEAN PLASMA GLUCOSE: 140 mg/dL

## 2017-07-09 LAB — BASIC METABOLIC PANEL WITH GFR
BUN: 19 mg/dL (ref 7–25)
CO2: 25 mmol/L (ref 20–32)
CREATININE: 1.03 mg/dL — AB (ref 0.50–0.99)
Calcium: 9.8 mg/dL (ref 8.6–10.4)
Chloride: 100 mmol/L (ref 98–110)
GFR, EST AFRICAN AMERICAN: 66 mL/min (ref >=60)
GFR, Est Non African American: 57 mL/min — ABNORMAL LOW (ref >=60)
Glucose, Bld: 95 mg/dL (ref 65–99)
Potassium: 4 mmol/L (ref 3.5–5.3)
Sodium: 138 mmol/L (ref 135–146)

## 2017-07-09 LAB — LIPID PANEL
CHOL/HDL RATIO: 2.7 ratio (ref <5.0)
Cholesterol: 218 mg/dL — ABNORMAL HIGH (ref <200)
HDL: 81 mg/dL (ref >50)
LDL CALC: 109 mg/dL — AB (ref <100)
Triglycerides: 139 mg/dL (ref <150)
VLDL: 28 mg/dL (ref <30)

## 2017-07-09 LAB — TSH: TSH: 2.65 m[IU]/L

## 2017-07-09 LAB — MAGNESIUM: Magnesium: 1.7 mg/dL (ref 1.5–2.5)

## 2017-07-09 NOTE — Progress Notes (Signed)
Pt aware of lab results & voiced understanding of those results.

## 2017-07-13 ENCOUNTER — Encounter: Payer: Self-pay | Admitting: Internal Medicine

## 2017-07-15 ENCOUNTER — Ambulatory Visit (INDEPENDENT_AMBULATORY_CARE_PROVIDER_SITE_OTHER): Payer: Medicare Other | Admitting: Orthopedic Surgery

## 2017-07-15 ENCOUNTER — Encounter (INDEPENDENT_AMBULATORY_CARE_PROVIDER_SITE_OTHER): Payer: Self-pay | Admitting: Orthopedic Surgery

## 2017-07-15 ENCOUNTER — Ambulatory Visit (INDEPENDENT_AMBULATORY_CARE_PROVIDER_SITE_OTHER): Payer: Medicare Other

## 2017-07-15 DIAGNOSIS — M6702 Short Achilles tendon (acquired), left ankle: Secondary | ICD-10-CM | POA: Diagnosis not present

## 2017-07-15 DIAGNOSIS — M79672 Pain in left foot: Secondary | ICD-10-CM

## 2017-07-15 DIAGNOSIS — M25571 Pain in right ankle and joints of right foot: Secondary | ICD-10-CM

## 2017-07-15 DIAGNOSIS — M79671 Pain in right foot: Secondary | ICD-10-CM

## 2017-07-15 DIAGNOSIS — M25572 Pain in left ankle and joints of left foot: Secondary | ICD-10-CM | POA: Diagnosis not present

## 2017-07-15 DIAGNOSIS — G8929 Other chronic pain: Secondary | ICD-10-CM

## 2017-07-15 DIAGNOSIS — M6701 Short Achilles tendon (acquired), right ankle: Secondary | ICD-10-CM

## 2017-07-15 NOTE — Progress Notes (Signed)
Office Visit Note   Patient: Lauren Lopez           Date of Birth: 04-07-1951           MRN: [id number] Visit Date: 07/15/2017              Requested by: Vicie Mutters, PA-C [address] [place], [place] [address] PCP: Unk Pinto, MD  Chief Complaint  Patient presents with  . Right Ankle - Pain, Follow-up  . Left Ankle - Pain, Follow-up      HPI: Patient is a 66 year old woman who is been having forefoot pain for years as well as cracking and popping in her ankles. She states she feels like the bone is shifting. Past medical history positive for type 2 diabetes diet controlled she states her recent hemoglobin A1c was 6.6. She has a history of hypertension pneumonia ulcers arthritis and acid reflux. She does not smoke she's had surgery on both knees both hands right shoulder and cataract surgery. Patient states she is on hydrochlorothiazide for blood pressure.  Assessment & Plan: Visit Diagnoses:  1. Chronic pain of both ankles   2. Bilateral foot pain   3. Achilles tendon contracture, bilateral     Plan: Patient was given instructions and demonstrate Achilles stretching to do 5 times a day minute each time. Patient was given instructions to have a stiff soled walking shoe or trail running shoe with sole orthotics to unload the metatarsal heads. Discussed that if patient cannot relieve her symptoms with stretching we could consider a gastrocnemius recession for Achilles lengthening and Weil osteotomy to unload the metatarsal heads.  Follow-Up Instructions: No Follow-up on file.   Ortho Exam  Patient is alert, oriented, no adenopathy, well-dressed, normal affect, normal respiratory effort. Examination patient does have an antalgic gait she has good pulses she has good ankle good subtalar motion with her knee extended she has significant Achilles contracture with dorsiflexion 10 short of neutral with her knee extended. Patient has callus beneath the  forefoot from the Achilles contracture and she has tenderness to palpation beneath the metatarsal heads. Patient does have slight pace planus but does have intact posterior tibial tendon function with standing on her toes.  Imaging: Xr Ankle Complete Left  Result Date: 07/15/2017 Two-view radiographs of the left ankle shows a congruent joint space no osteochondral defect.  Xr Foot Complete Left  Result Date: 07/15/2017 Two-view radiographs of the left heart shows a long second and third metatarsal congruent joint spaces no Charcot collapse  Xr Ankle Complete Right  Result Date: 07/15/2017 Two-view radiographs of the right ankle shows a congruent joint space no osteochondral defect.  Xr Foot Complete Right  Result Date: 07/15/2017 Two-view radiographs of the right foot shows a long second and third metatarsal with congruent joint spaces Charcot collapse  No images are attached to the encounter.  Labs: Lab Results  Component Value Date   HGBA1C 6.5 (H) 07/08/2017   HGBA1C 6.4 (H) 02/04/2017   HGBA1C 6.2 (H) 10/15/2016   ESRSEDRATE 18 04/03/2016   CRP 1.2 (H) 04/03/2016   LABORGA NO GROWTH 02/04/2017    Orders:  Orders Placed This Encounter  Procedures  . XR Foot Complete Right  . XR Foot Complete Left  . XR Ankle Complete Left  . XR Ankle Complete Right   No orders of the defined types were placed in this encounter.    Procedures: No procedures performed  Clinical Data: No additional findings.  ROS:  All other systems negative, except as noted in the HPI. Review of Systems  Objective: Vital Signs: There were no vitals taken for this visit.  Specialty Comments:  No specialty comments available.  PMFS History: Patient Active Problem List   Diagnosis Date Noted  . Osteoporosis 06/01/2017  . Back pain 04/05/2016  . Prediabetes   . Medication management 01/29/2015  . GERD (gastroesophageal reflux disease)   . Mixed hyperlipidemia   . Vitamin D deficiency    . Asthma   . Right rotator cuff tear 01/20/2013  . Essential hypertension 08/09/2009   Past Medical History:  Diagnosis Date  . Anemia   . Arthritis    "right hand, back" (04/03/2016)  . Asthma   . Bilateral carpal tunnel syndrome   . Chronic lower back pain   . GERD (gastroesophageal reflux disease)   . Hyperlipidemia   . Hypertension   . Multiple allergies   . Pneumonia 03/2012  . Positive PPD    "they told me it wasn't positive; I was allergic to the test itself"  . Type II diabetes mellitus ([place])   . Vitamin deficiency   . Wears dentures    upper  . Wears glasses     Family History  Problem Relation Age of Onset  . Hypertension Mother   . Diabetes Mother   . Stroke Mother   . Hypertension Father   . Heart disease Brother   . Hyperlipidemia Brother   . Diabetes Brother     Past Surgical History:  Procedure Laterality Date  . ABDOMINAL HYSTERECTOMY    . BILATERAL SALPINGOOPHORECTOMY  11/28/2010   open laparoscopy with adhesiolysis also  . CARPAL TUNNEL RELEASE  07/15/2012   Procedure: CARPAL TUNNEL RELEASE;  Surgeon: Cammie Sickle., MD;  Location: [facility];  Service: Orthopedics;  Laterality: Left;  . CARPAL TUNNEL RELEASE Right 05/31/2014   Procedure: RIGHT CARPAL TUNNEL RELEASE;  Surgeon: Cammie Sickle, MD;  Location: [facility];  Service: Orthopedics;  Laterality: Right;  . COLONOSCOPY    . KNEE ARTHROSCOPY Bilateral 2005-2016   "right-left"  . SHOULDER ARTHROSCOPY WITH ROTATOR CUFF REPAIR AND SUBACROMIAL DECOMPRESSION Right 01/20/2013   Procedure: RIGHT ARTHROSCOPY SHOULDER DEBRIDEMENT LIMITED, ARTHROSCOPY SHOULDER DECOMPRESSION SUBACROMIAL PARTIAL ACROMIOPLASTY WITH CORACOACROMIAL RELEASE, ROTATOR CUFF REPAIR ;  Surgeon: Johnny Bridge, MD;  Location: [facility];  Service: Orthopedics;  Laterality: Right;  RIGHT SHOULDER SCOPE DEBRIDEMENT, ACRIOMIOPLASTY, ROTATOR CUFF REPAIR  . TUBAL LIGATION     Social  History   Occupational History  . Not on file.   Social History Main Topics  . Smoking status: Never Smoker  . Smokeless tobacco: Never Used  . Alcohol use No  . Drug use: No  . Sexual activity: Not Currently

## 2017-07-16 DIAGNOSIS — J301 Allergic rhinitis due to pollen: Secondary | ICD-10-CM | POA: Diagnosis not present

## 2017-07-16 DIAGNOSIS — J3089 Other allergic rhinitis: Secondary | ICD-10-CM | POA: Diagnosis not present

## 2017-07-20 DIAGNOSIS — J301 Allergic rhinitis due to pollen: Secondary | ICD-10-CM | POA: Diagnosis not present

## 2017-07-20 DIAGNOSIS — J3089 Other allergic rhinitis: Secondary | ICD-10-CM | POA: Diagnosis not present

## 2017-07-28 DIAGNOSIS — J301 Allergic rhinitis due to pollen: Secondary | ICD-10-CM | POA: Diagnosis not present

## 2017-07-28 DIAGNOSIS — J3089 Other allergic rhinitis: Secondary | ICD-10-CM | POA: Diagnosis not present

## 2017-08-05 DIAGNOSIS — J301 Allergic rhinitis due to pollen: Secondary | ICD-10-CM | POA: Diagnosis not present

## 2017-08-05 DIAGNOSIS — J3089 Other allergic rhinitis: Secondary | ICD-10-CM | POA: Diagnosis not present

## 2017-08-10 DIAGNOSIS — J3089 Other allergic rhinitis: Secondary | ICD-10-CM | POA: Diagnosis not present

## 2017-08-10 DIAGNOSIS — J301 Allergic rhinitis due to pollen: Secondary | ICD-10-CM | POA: Diagnosis not present

## 2017-08-16 DIAGNOSIS — J3089 Other allergic rhinitis: Secondary | ICD-10-CM | POA: Diagnosis not present

## 2017-08-16 DIAGNOSIS — J301 Allergic rhinitis due to pollen: Secondary | ICD-10-CM | POA: Diagnosis not present

## 2017-08-23 DIAGNOSIS — J3089 Other allergic rhinitis: Secondary | ICD-10-CM | POA: Diagnosis not present

## 2017-08-23 DIAGNOSIS — J301 Allergic rhinitis due to pollen: Secondary | ICD-10-CM | POA: Diagnosis not present

## 2017-08-29 ENCOUNTER — Other Ambulatory Visit: Payer: Self-pay | Admitting: Internal Medicine

## 2017-09-01 DIAGNOSIS — J301 Allergic rhinitis due to pollen: Secondary | ICD-10-CM | POA: Diagnosis not present

## 2017-09-01 DIAGNOSIS — J3089 Other allergic rhinitis: Secondary | ICD-10-CM | POA: Diagnosis not present

## 2017-09-07 DIAGNOSIS — J3089 Other allergic rhinitis: Secondary | ICD-10-CM | POA: Diagnosis not present

## 2017-09-07 DIAGNOSIS — J301 Allergic rhinitis due to pollen: Secondary | ICD-10-CM | POA: Diagnosis not present

## 2017-09-14 DIAGNOSIS — J3089 Other allergic rhinitis: Secondary | ICD-10-CM | POA: Diagnosis not present

## 2017-09-14 DIAGNOSIS — J301 Allergic rhinitis due to pollen: Secondary | ICD-10-CM | POA: Diagnosis not present

## 2017-09-22 DIAGNOSIS — J3089 Other allergic rhinitis: Secondary | ICD-10-CM | POA: Diagnosis not present

## 2017-09-22 DIAGNOSIS — J301 Allergic rhinitis due to pollen: Secondary | ICD-10-CM | POA: Diagnosis not present

## 2017-09-23 ENCOUNTER — Encounter: Payer: Self-pay | Admitting: *Deleted

## 2017-09-28 DIAGNOSIS — J301 Allergic rhinitis due to pollen: Secondary | ICD-10-CM | POA: Diagnosis not present

## 2017-09-28 DIAGNOSIS — J3089 Other allergic rhinitis: Secondary | ICD-10-CM | POA: Diagnosis not present

## 2017-09-30 DIAGNOSIS — H401131 Primary open-angle glaucoma, bilateral, mild stage: Secondary | ICD-10-CM | POA: Diagnosis not present

## 2017-09-30 DIAGNOSIS — H35353 Cystoid macular degeneration, bilateral: Secondary | ICD-10-CM | POA: Diagnosis not present

## 2017-09-30 DIAGNOSIS — H20013 Primary iridocyclitis, bilateral: Secondary | ICD-10-CM | POA: Diagnosis not present

## 2017-09-30 DIAGNOSIS — Z961 Presence of intraocular lens: Secondary | ICD-10-CM | POA: Diagnosis not present

## 2017-09-30 LAB — HM DIABETES EYE EXAM

## 2017-10-05 DIAGNOSIS — J3089 Other allergic rhinitis: Secondary | ICD-10-CM | POA: Diagnosis not present

## 2017-10-05 DIAGNOSIS — J301 Allergic rhinitis due to pollen: Secondary | ICD-10-CM | POA: Diagnosis not present

## 2017-10-12 DIAGNOSIS — J301 Allergic rhinitis due to pollen: Secondary | ICD-10-CM | POA: Diagnosis not present

## 2017-10-12 DIAGNOSIS — J3089 Other allergic rhinitis: Secondary | ICD-10-CM | POA: Diagnosis not present

## 2017-10-19 ENCOUNTER — Encounter: Payer: Self-pay | Admitting: *Deleted

## 2017-10-19 DIAGNOSIS — J3089 Other allergic rhinitis: Secondary | ICD-10-CM | POA: Diagnosis not present

## 2017-10-20 DIAGNOSIS — H20013 Primary iridocyclitis, bilateral: Secondary | ICD-10-CM | POA: Diagnosis not present

## 2017-10-20 DIAGNOSIS — H35353 Cystoid macular degeneration, bilateral: Secondary | ICD-10-CM | POA: Diagnosis not present

## 2017-10-27 DIAGNOSIS — J301 Allergic rhinitis due to pollen: Secondary | ICD-10-CM | POA: Diagnosis not present

## 2017-10-27 DIAGNOSIS — J3089 Other allergic rhinitis: Secondary | ICD-10-CM | POA: Diagnosis not present

## 2017-11-03 DIAGNOSIS — J301 Allergic rhinitis due to pollen: Secondary | ICD-10-CM | POA: Diagnosis not present

## 2017-11-03 DIAGNOSIS — J3089 Other allergic rhinitis: Secondary | ICD-10-CM | POA: Diagnosis not present

## 2017-11-10 ENCOUNTER — Ambulatory Visit (INDEPENDENT_AMBULATORY_CARE_PROVIDER_SITE_OTHER): Payer: Medicare Other | Admitting: Physician Assistant

## 2017-11-10 ENCOUNTER — Encounter: Payer: Self-pay | Admitting: Physician Assistant

## 2017-11-10 VITALS — BP 120/76 | HR 72 | Temp 97.7°F | Resp 14 | Ht 63.5 in | Wt 198.0 lb

## 2017-11-10 DIAGNOSIS — Z6834 Body mass index (BMI) 34.0-34.9, adult: Secondary | ICD-10-CM

## 2017-11-10 DIAGNOSIS — Z136 Encounter for screening for cardiovascular disorders: Secondary | ICD-10-CM

## 2017-11-10 DIAGNOSIS — Z0000 Encounter for general adult medical examination without abnormal findings: Secondary | ICD-10-CM

## 2017-11-10 DIAGNOSIS — E559 Vitamin D deficiency, unspecified: Secondary | ICD-10-CM

## 2017-11-10 DIAGNOSIS — E119 Type 2 diabetes mellitus without complications: Secondary | ICD-10-CM

## 2017-11-10 DIAGNOSIS — E782 Mixed hyperlipidemia: Secondary | ICD-10-CM

## 2017-11-10 DIAGNOSIS — Z23 Encounter for immunization: Secondary | ICD-10-CM | POA: Diagnosis not present

## 2017-11-10 DIAGNOSIS — J45909 Unspecified asthma, uncomplicated: Secondary | ICD-10-CM

## 2017-11-10 DIAGNOSIS — M810 Age-related osteoporosis without current pathological fracture: Secondary | ICD-10-CM

## 2017-11-10 DIAGNOSIS — Z0001 Encounter for general adult medical examination with abnormal findings: Secondary | ICD-10-CM

## 2017-11-10 DIAGNOSIS — I10 Essential (primary) hypertension: Secondary | ICD-10-CM

## 2017-11-10 DIAGNOSIS — Z79899 Other long term (current) drug therapy: Secondary | ICD-10-CM

## 2017-11-10 DIAGNOSIS — E2839 Other primary ovarian failure: Secondary | ICD-10-CM

## 2017-11-10 DIAGNOSIS — K219 Gastro-esophageal reflux disease without esophagitis: Secondary | ICD-10-CM

## 2017-11-10 NOTE — Patient Instructions (Signed)
We want weight loss that will last so you should lose 1-2 pounds a week.  THAT IS IT! Please pick THREE things a month to change. Once it is a habit check off the item. Then pick another three items off the list to become habits.  If you are already doing a habit on the list GREAT!  Cross that item off! o Don't drink your calories. Ie, alcohol, soda, fruit juice, and sweet tea.  o Drink more water. Drink a glass when you feel hungry or before each meal.  o Eat breakfast - Complex carb and protein (likeDannon light and fit yogurt, oatmeal, fruit, eggs, turkey bacon). o Measure your cereal.  Eat no more than one cup a day. (ie Kashi) o Eat an apple a day. o Add a vegetable a day. o Try a new vegetable a month. o Use Pam! Stop using oil or butter to cook. o Don't finish your plate or use smaller plates. o Share your dessert. o Eat sugar free Jello for dessert or frozen grapes. o Don't eat 2-3 hours before bed. o Switch to whole wheat bread, pasta, and brown rice. o Make healthier choices when you eat out. No fries! o Pick baked chicken, NOT fried. o Don't forget to SLOW DOWN when you eat. It is not going anywhere.  o Take the stairs. o Park far away in the parking lot o Lift soup cans (or weights) for 10 minutes while watching TV. o Walk at work for 10 minutes during break. o Walk outside 1 time a week with your friend, kids, dog, or significant other. o Start a walking group at church. o Walk the mall as much as you can tolerate.  o Keep a food diary. o Weigh yourself daily. o Walk for 15 minutes 3 days per week. o Cook at home more often and eat out less.  If life happens and you go back to old habits, it is okay.  Just start over. You can do it!   If you experience chest pain, get short of breath, or tired during the exercise, please stop immediately and inform your doctor.      Bad carbs also include fruit juice, alcohol, and sweet tea. These are empty calories that do not signal  to your brain that you are full.   Please remember the good carbs are still carbs which convert into sugar. So please measure them out no more than 1/2-1 cup of rice, oatmeal, pasta, and beans  Veggies are however free foods! Pile them on.   Not all fruit is created equal. Please see the list below, the fruit at the bottom is higher in sugars than the fruit at the top. Please avoid all dried fruits.     

## 2017-11-10 NOTE — Progress Notes (Signed)
CPE AND FOLLOW UP Assessment:    Essential hypertension - continue medications, DASH diet, exercise and monitor at home. Call if greater than 130/80.  -     CBC with Differential/Platelet -     BASIC METABOLIC PANEL WITH GFR -     Hepatic function panel -     TSH -     Urinalysis, Routine w reflex microscopic -     Microalbumin / creatinine urine ratio -     EKG 12-Lead  Mixed hyperlipidemia -continue medications, check lipids, decrease fatty foods, increase activity.  -     Lipid panel  Medication management -     Magnesium  Uncomplicated asthma, unspecified asthma severity, unspecified whether persistent Monitor symptoms  Gastroesophageal reflux disease, esophagitis presence not specified Continue PPI/H2 blocker, diet discussed  Age related osteoporosis, unspecified pathological fracture presence Continue fosamax, need DEXA 2 years  Vitamin D deficiency  Type 2 diabetes mellitus without complication, without long-term current use of insulin ([place]) Discussed general issues about diabetes pathophysiology and management., Educational material distributed., Suggested low cholesterol diet., Encouraged aerobic exercise., Discussed foot care., Reminded to get yearly retinal exam. -     Hemoglobin A1c  Estrogen deficiency Continue to monitor DEXA  Encounter for general adult medical examination with abnormal findings 1 year  Needs flu shot -     Flu vaccine HIGH DOSE PF  BMI 34.0-34.9,adult - long discussion about weight loss, diet, and exercise -recommended diet heavy in fruits and veggies and low in animal meats, cheeses, and dairy products   Over 30 minutes of exam, counseling, chart review, and critical decision making was performed  Future Appointments  Date Time Provider [place]  11/17/2018  3:00 PM Vicie Mutters, PA-C [id number] None     Subjective:  Lauren Lopez is a 66 y.o. AA female who presents for CPE and 3 month follow up for HTN,  hyperlipidemia, prediabetes, and vitamin D Def.   Her blood pressure has been controlled at home, today their BP is BP: 120/76 She does workout, she is going to [facility], she is lifting weights and walking.  She denies chest pain, shortness of breath, dizziness.   Husband has leukemia, they are monitoring it.   She is on cholesterol medication and denies myalgias. Her cholesterol is at goal. 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 Last A1C in the office was:  Lab Results  Component Value Date   HGBA1C 6.5 (H) 07/08/2017   Last GFR Lab Results  Component Value Date   GFRNONAA 57 (L) 07/08/2017   She is on fosamax for osteporosis x 06/2017.  Patient is on Vitamin D supplement.   Lab Results  Component Value Date   VD25OH 40 07/10/2015   BMI is Body mass index is 34.52 kg/m., she is working on diet and exercise. Wt Readings from Last 3 Encounters:  11/10/17 198 lb (89.8 kg)  07/08/17 199 lb 9.6 oz (90.5 kg)  02/04/17 200 lb (90.7 kg)    Medication Review:  Current Outpatient Medications on File Prior to Visit  Medication Sig Dispense Refill  . alendronate (FOSAMAX) 70 MG tablet Take 1 tablet (70 mg total) by mouth every 7 (seven) days. Take with a full glass of water on an empty stomach. 4 tablet 11  . calcium carbonate (CALCIUM 600) 600 MG TABS tablet Take 600 mg by mouth daily.    . Cholecalciferol (VITAMIN D3) 5000 UNITS TABS Take 5,000 Units by mouth daily.    . cyanocobalamin 100 MCG tablet Take 100 mcg by mouth daily.     . fexofenadine (ALLEGRA) 180 MG tablet Take 1 tablet (180 mg total) by mouth daily. 90 tablet 6  . fluticasone-salmeterol (ADVAIR HFA) 115-21 MCG/ACT inhaler Inhale 2 puffs into the lungs 2 (two) times daily. 1 Inhaler 6  . hydrochlorothiazide (HYDRODIURIL) 25 MG tablet Take 1 tablet (25 mg total) by mouth daily. 90 tablet 2  . MAGNESIUM PO Take 500 mg by mouth daily.     . Omega 3-6-9 Fatty Acids (OMEGA 3-6-9 COMPLEX) CAPS Take by mouth daily.    Marland Kitchen omeprazole (PRILOSEC) 40 MG capsule TAKE 1 CAPSULE BY MOUTH EVERY DAY 90 capsule 1  . prednisoLONE acetate (PRED FORTE) 1 % ophthalmic suspension 1 drop 4 (four) times daily.    . ranitidine (ZANTAC) 300 MG tablet Take 1 tablet (300 mg total) by mouth at bedtime. 60 tablet 1  . Red Yeast Rice Extract (RED YEAST RICE PO) Take 2,000 mg by mouth daily.     No current facility-administered medications on file prior to visit.     Allergies: Allergies  Allergen Reactions  . Metronidazole Other (See Comments)    unknown  . Pneumovax 23 [Pneumococcal Vac Polyvalent]     Redness and severe swelling at injection site  . Ppd [Tuberculin Purified Protein Derivative] Other (See Comments)    + PPD 2013 with NEG CXR 05/2012    Current Problems (verified) has Essential hypertension; Right rotator cuff tear; GERD (gastroesophageal reflux disease); Mixed hyperlipidemia; Vitamin D deficiency; Asthma; Medication management; Back pain; and Osteoporosis on their problem list.  Screening Tests Immunization History  Administered Date(s) Administered  . Influenza, High Dose Seasonal PF 11/10/2017  . Influenza, Seasonal, Injecte, Preservative Fre 10/17/2015  . Influenza,inj,quad, With Preservative 10/15/2016  . Pneumococcal Polysaccharide-23 02/04/2017  . Pneumococcal-Unspecified 06/27/2013  . Tdap 04/23/2012  . Zoster 07/31/2015    Preventative care: Last colonoscopy: 2009 [facility] Pap: Hysterectomy Mammogram:  05/27/2017 DEXA: 05/25/2017 due 2 years  started fosamax 06/2017 CXR 03/2016  Names of Other Physician/Practitioners you currently use: 1. [facility] here for primary care 2. Dr. Katy Fitch, eye doctor, last visit 09/30/2017 3. Dr. Mariea Clonts  , dentist, last visit 2018 Patient Care Team: Unk Pinto, MD as PCP - General (Internal Medicine) Sypher, Herbie Baltimore, MD (Inactive) as Consulting Physician (Orthopedic Surgery) Monna Fam, MD as Consulting Physician (Ophthalmology) Richmond Campbell, MD as Consulting Physician (Gastroenterology) Cheri Fowler, MD as Consulting Physician (Obstetrics and Gynecology) Gaynelle Arabian, MD as Consulting Physician (Orthopedic Surgery) Marchia Bond, MD as Consulting Physician (Orthopedic Surgery) Mosetta Anis, MD as Referring Physician (Allergy) Larey Dresser, MD as Consulting Physician (Cardiology)  Surgical: She  has a past surgical history that includes Bilateral salpingoophorectomy (11/28/2010); Knee arthroscopy (Bilateral, 2005-2016); Colonoscopy; Carpal tunnel release (07/15/2012); Shoulder arthroscopy with rotator cuff repair and subacromial decompression (Right, 01/20/2013); Abdominal hysterectomy; Carpal tunnel release (Right, 05/31/2014); and Tubal ligation. Family Her family history includes Diabetes in her brother and mother; Heart disease in her  brother; Hyperlipidemia in her brother; Hypertension in her father and mother; Stroke in her mother. Social history  She reports that  has never smoked. she has never used smokeless tobacco. She reports that she does not drink alcohol or use drugs.   Objective:   Today's Vitals   11/10/17 1603  BP: 120/76  Pulse: 72  Resp: 14  Temp: 97.7 F (36.5 C)  SpO2: 98%  Weight: 198 lb (89.8 kg)  Height: 5' 3.5" (1.613 m)   Body mass index is 34.52 kg/m.  General appearance: alert, no distress, WD/WN, female HEENT: normocephalic, sclerae anicteric, TMs pearly, nares patent, no discharge or erythema,  pharynx normal Oral cavity: MMM, no lesions Neck: supple, no lymphadenopathy, no thyromegaly, no masses Heart: RRR, normal S1, S2, no murmurs Lungs: CTA bilaterally, no wheezes, rhonchi, or rales Abdomen: +bs, soft, non tender, non distended, no masses, no hepatomegaly, no splenomegaly Musculoskeletal: nontender, no swelling, no obvious deformity Extremities: no edema, no cyanosis, no clubbing Pulses: 2+ symmetric, upper and lower extremities, normal cap refill Neurological: alert, oriented x 3, CN2-12 intact, strength normal upper extremities and lower extremities, sensation normal throughout, DTRs 2+ throughout, no cerebellar signs, gait normal Psychiatric: normal affect, behavior normal, pleasant     Vicie Mutters, PA-C   11/10/2017

## 2017-11-11 LAB — HEPATIC FUNCTION PANEL
AG RATIO: 1.4 (calc) (ref 1.0–2.5)
ALBUMIN MSPROF: 4.2 g/dL (ref 3.6–5.1)
ALKALINE PHOSPHATASE (APISO): 110 U/L (ref 33–130)
ALT: 19 U/L (ref 6–29)
AST: 23 U/L (ref 10–35)
BILIRUBIN TOTAL: 0.5 mg/dL (ref 0.2–1.2)
Bilirubin, Direct: 0.1 mg/dL (ref 0.0–0.2)
Globulin: 3 g/dL (calc) (ref 1.9–3.7)
Indirect Bilirubin: 0.4 mg/dL (calc) (ref 0.2–1.2)
TOTAL PROTEIN: 7.2 g/dL (ref 6.1–8.1)

## 2017-11-11 LAB — CBC WITH DIFFERENTIAL/PLATELET
Basophils Absolute: 17 cells/uL (ref 0–200)
Basophils Relative: 0.3 %
EOS PCT: 1.9 %
Eosinophils Absolute: 110 cells/uL (ref 15–500)
HEMATOCRIT: 37.8 % (ref 35.0–45.0)
Hemoglobin: 12 g/dL (ref 11.7–15.5)
LYMPHS ABS: 3231 {cells}/uL (ref 850–3900)
MCH: 26.6 pg — ABNORMAL LOW (ref 27.0–33.0)
MCHC: 31.7 g/dL — ABNORMAL LOW (ref 32.0–36.0)
MCV: 83.8 fL (ref 80.0–100.0)
MONOS PCT: 5.2 %
MPV: 11.8 fL (ref 7.5–12.5)
NEUTROS PCT: 36.9 %
Neutro Abs: 2140 cells/uL (ref 1500–7800)
PLATELETS: 272 10*3/uL (ref 140–400)
RBC: 4.51 10*6/uL (ref 3.80–5.10)
RDW: 13.4 % (ref 11.0–15.0)
Total Lymphocyte: 55.7 %
WBC mixed population: 302 cells/uL (ref 200–950)
WBC: 5.8 10*3/uL (ref 3.8–10.8)

## 2017-11-11 LAB — BASIC METABOLIC PANEL WITH GFR
BUN: 14 mg/dL (ref 7–25)
CO2: 28 mmol/L (ref 20–32)
Calcium: 9.9 mg/dL (ref 8.6–10.4)
Chloride: 103 mmol/L (ref 98–110)
Creat: 0.83 mg/dL (ref 0.50–0.99)
GFR, EST AFRICAN AMERICAN: 85 mL/min/{1.73_m2} (ref >=60)
GFR, EST NON AFRICAN AMERICAN: 73 mL/min/{1.73_m2} (ref >=60)
Glucose, Bld: 98 mg/dL (ref 65–99)
POTASSIUM: 4 mmol/L (ref 3.5–5.3)
SODIUM: 140 mmol/L (ref 135–146)

## 2017-11-11 LAB — URINALYSIS, ROUTINE W REFLEX MICROSCOPIC
Bilirubin Urine: NEGATIVE
Glucose, UA: NEGATIVE
HGB URINE DIPSTICK: NEGATIVE
Ketones, ur: NEGATIVE
LEUKOCYTES UA: NEGATIVE
NITRITE: NEGATIVE
PROTEIN: NEGATIVE
Specific Gravity, Urine: 1.014 (ref 1.001–1.03)
pH: <=5 (ref 5.0–8.0)

## 2017-11-11 LAB — LIPID PANEL
CHOLESTEROL: 199 mg/dL (ref <200)
HDL: 86 mg/dL (ref >50)
LDL Cholesterol (Calc): 98 mg/dL (calc)
Non-HDL Cholesterol (Calc): 113 mg/dL (calc) (ref <130)
TRIGLYCERIDES: 67 mg/dL (ref <150)
Total CHOL/HDL Ratio: 2.3 (calc) (ref <5.0)

## 2017-11-11 LAB — TSH: TSH: 0.93 mIU/L (ref 0.40–4.50)

## 2017-11-11 LAB — HEMOGLOBIN A1C
EAG (MMOL/L): 7.4 (calc)
Hgb A1c MFr Bld: 6.3 % of total Hgb — ABNORMAL HIGH (ref <5.7)
Mean Plasma Glucose: 134 (calc)

## 2017-11-11 LAB — MICROALBUMIN / CREATININE URINE RATIO
CREATININE, URINE: 62 mg/dL (ref 20–275)
MICROALB UR: 0.2 mg/dL
MICROALB/CREAT RATIO: 3 ug/mg{creat} (ref <30)

## 2017-11-11 LAB — MAGNESIUM: MAGNESIUM: 2 mg/dL (ref 1.5–2.5)

## 2017-11-12 DIAGNOSIS — J3089 Other allergic rhinitis: Secondary | ICD-10-CM | POA: Diagnosis not present

## 2017-11-17 DIAGNOSIS — J3089 Other allergic rhinitis: Secondary | ICD-10-CM | POA: Diagnosis not present

## 2017-11-17 DIAGNOSIS — J301 Allergic rhinitis due to pollen: Secondary | ICD-10-CM | POA: Diagnosis not present

## 2017-11-25 DIAGNOSIS — J301 Allergic rhinitis due to pollen: Secondary | ICD-10-CM | POA: Diagnosis not present

## 2017-11-25 DIAGNOSIS — J3089 Other allergic rhinitis: Secondary | ICD-10-CM | POA: Diagnosis not present

## 2017-12-01 DIAGNOSIS — Z961 Presence of intraocular lens: Secondary | ICD-10-CM | POA: Diagnosis not present

## 2017-12-01 DIAGNOSIS — H35353 Cystoid macular degeneration, bilateral: Secondary | ICD-10-CM | POA: Diagnosis not present

## 2017-12-01 DIAGNOSIS — H209 Unspecified iridocyclitis: Secondary | ICD-10-CM | POA: Diagnosis not present

## 2017-12-07 DIAGNOSIS — J301 Allergic rhinitis due to pollen: Secondary | ICD-10-CM | POA: Diagnosis not present

## 2017-12-07 DIAGNOSIS — J3089 Other allergic rhinitis: Secondary | ICD-10-CM | POA: Diagnosis not present

## 2017-12-15 DIAGNOSIS — J301 Allergic rhinitis due to pollen: Secondary | ICD-10-CM | POA: Diagnosis not present

## 2017-12-15 DIAGNOSIS — J3089 Other allergic rhinitis: Secondary | ICD-10-CM | POA: Diagnosis not present

## 2017-12-22 DIAGNOSIS — J301 Allergic rhinitis due to pollen: Secondary | ICD-10-CM | POA: Diagnosis not present

## 2017-12-22 DIAGNOSIS — J3089 Other allergic rhinitis: Secondary | ICD-10-CM | POA: Diagnosis not present

## 2017-12-29 DIAGNOSIS — J3089 Other allergic rhinitis: Secondary | ICD-10-CM | POA: Diagnosis not present

## 2017-12-29 DIAGNOSIS — J301 Allergic rhinitis due to pollen: Secondary | ICD-10-CM | POA: Diagnosis not present

## 2017-12-31 DIAGNOSIS — J301 Allergic rhinitis due to pollen: Secondary | ICD-10-CM | POA: Diagnosis not present

## 2017-12-31 DIAGNOSIS — J3089 Other allergic rhinitis: Secondary | ICD-10-CM | POA: Diagnosis not present

## 2018-01-06 DIAGNOSIS — J301 Allergic rhinitis due to pollen: Secondary | ICD-10-CM | POA: Diagnosis not present

## 2018-01-06 DIAGNOSIS — J3089 Other allergic rhinitis: Secondary | ICD-10-CM | POA: Diagnosis not present

## 2018-01-12 DIAGNOSIS — J301 Allergic rhinitis due to pollen: Secondary | ICD-10-CM | POA: Diagnosis not present

## 2018-01-12 DIAGNOSIS — J3089 Other allergic rhinitis: Secondary | ICD-10-CM | POA: Diagnosis not present

## 2018-01-13 ENCOUNTER — Other Ambulatory Visit: Payer: Self-pay | Admitting: *Deleted

## 2018-01-13 MED ORDER — OMEPRAZOLE 40 MG PO CPDR: DELAYED_RELEASE_CAPSULE | ORAL | 0 refills | Status: DC

## 2018-01-19 DIAGNOSIS — J301 Allergic rhinitis due to pollen: Secondary | ICD-10-CM | POA: Diagnosis not present

## 2018-01-19 DIAGNOSIS — J3089 Other allergic rhinitis: Secondary | ICD-10-CM | POA: Diagnosis not present

## 2018-01-25 DIAGNOSIS — J301 Allergic rhinitis due to pollen: Secondary | ICD-10-CM | POA: Diagnosis not present

## 2018-01-25 DIAGNOSIS — J3089 Other allergic rhinitis: Secondary | ICD-10-CM | POA: Diagnosis not present

## 2018-02-02 DIAGNOSIS — J3089 Other allergic rhinitis: Secondary | ICD-10-CM | POA: Diagnosis not present

## 2018-02-02 DIAGNOSIS — J301 Allergic rhinitis due to pollen: Secondary | ICD-10-CM | POA: Diagnosis not present

## 2018-02-04 ENCOUNTER — Encounter: Payer: Self-pay | Admitting: Physician Assistant

## 2018-02-04 ENCOUNTER — Ambulatory Visit (INDEPENDENT_AMBULATORY_CARE_PROVIDER_SITE_OTHER): Payer: Medicare Other | Admitting: Physician Assistant

## 2018-02-04 VITALS — BP 118/66 | HR 80 | Temp 98.1°F | Ht 63.5 in | Wt 200.0 lb

## 2018-02-04 DIAGNOSIS — M545 Low back pain, unspecified: Secondary | ICD-10-CM

## 2018-02-04 DIAGNOSIS — M810 Age-related osteoporosis without current pathological fracture: Secondary | ICD-10-CM

## 2018-02-04 DIAGNOSIS — E785 Hyperlipidemia, unspecified: Secondary | ICD-10-CM | POA: Diagnosis not present

## 2018-02-04 DIAGNOSIS — Z79899 Other long term (current) drug therapy: Secondary | ICD-10-CM

## 2018-02-04 DIAGNOSIS — E559 Vitamin D deficiency, unspecified: Secondary | ICD-10-CM

## 2018-02-04 DIAGNOSIS — I10 Essential (primary) hypertension: Secondary | ICD-10-CM

## 2018-02-04 DIAGNOSIS — R6889 Other general symptoms and signs: Secondary | ICD-10-CM

## 2018-02-04 DIAGNOSIS — K219 Gastro-esophageal reflux disease without esophagitis: Secondary | ICD-10-CM | POA: Diagnosis not present

## 2018-02-04 DIAGNOSIS — Z0001 Encounter for general adult medical examination with abnormal findings: Secondary | ICD-10-CM | POA: Diagnosis not present

## 2018-02-04 DIAGNOSIS — J45909 Unspecified asthma, uncomplicated: Secondary | ICD-10-CM | POA: Diagnosis not present

## 2018-02-04 DIAGNOSIS — Z6834 Body mass index (BMI) 34.0-34.9, adult: Secondary | ICD-10-CM

## 2018-02-04 DIAGNOSIS — E782 Mixed hyperlipidemia: Secondary | ICD-10-CM

## 2018-02-04 DIAGNOSIS — Z0000 Encounter for general adult medical examination without abnormal findings: Secondary | ICD-10-CM

## 2018-02-04 DIAGNOSIS — E1169 Type 2 diabetes mellitus with other specified complication: Secondary | ICD-10-CM

## 2018-02-04 DIAGNOSIS — N390 Urinary tract infection, site not specified: Secondary | ICD-10-CM | POA: Diagnosis not present

## 2018-02-04 MED ORDER — CYCLOBENZAPRINE HCL 10 MG PO TABS: 10.0000 mg | ORAL_TABLET | Freq: Every evening | ORAL | 0 refills | Status: DC | PRN

## 2018-02-04 MED ORDER — MELOXICAM 15 MG PO TABS: ORAL_TABLET | ORAL | 1 refills | Status: DC

## 2018-02-04 NOTE — Progress Notes (Signed)
Medicare wellness AND FOLLOW UP Assessment:    Essential hypertension - continue medications, DASH diet, exercise and monitor at home. Call if greater than 130/80.  -     CBC with Differential/Platelet -     BASIC METABOLIC PANEL WITH GFR -     Hepatic function panel -     TSH  Mixed hyperlipidemia -continue medications, check lipids, decrease fatty foods, increase activity.  -     Lipid panel  Medication management -     Magnesium  Uncomplicated asthma, unspecified asthma severity, unspecified whether persistent Monitor symptoms  Gastroesophageal reflux disease, esophagitis presence not specified Continue PPI/H2 blocker, diet discussed  Age related osteoporosis, unspecified pathological fracture presence Continue fosamax, need DEXA 2 years  Vitamin D deficiency  Type 2 diabetes mellitus without complication, without long-term current use of insulin ([place]) Discussed general issues about diabetes pathophysiology and management., Educational material distributed., Suggested low cholesterol diet., Encouraged aerobic exercise., Discussed foot care., Reminded to get yearly retinal exam. -     Hemoglobin A1c  Medicare wellness Call about colonoscopy 1 year  BMI 34.0-34.9,adult - long discussion about weight loss, diet, and exercise -recommended diet heavy in fruits and veggies and low in animal meats, cheeses, and dairy products  Right-sided low back pain without sciatica, unspecified chronicity -     Ambulatory referral to Orthopedics -     meloxicam (MOBIC) 15 MG tablet; Take one daily with food for 2 weeks, can take with tylenol, can not take with aleve, iburpofen, then as needed daily for pain   Over 30 minutes of exam, counseling, chart review, and critical decision making was performed  Future Appointments  Date Time Provider [place]  05/31/2018 10:30 AM Liane Comber, NP [facility] None  11/17/2018  3:00 PM Vicie Mutters, PA-C [facility] None  02/09/2019  10:30 AM Liane Comber, NP [facility] None     Subjective:  Lauren Lopez is a 67 y.o. AA female who presents for CPE and 3 month follow up for HTN, hyperlipidemia, prediabetes, and vitamin D Def.   She has lower back pain, intermittent for 1-2 months, but X 3 days has been worse. Right sided lower back pain, wraps around to her groin. No pain down her leg, has had some leg weakness occ. Worse with movement, worse with lying flat on her back, getting in and out of the car. The heat helps, has been taking BC goody/advil and has been having some stomach pain. She has tried curamin natural. She has been working out.  Patient denies fever, hematuria, incontinence, numbness, tingling and saddle anesthesia.  Lumbar Xray 2015 and right hip negative IMPRESSION: No fracture or acute finding. Disc and facet degenerative changes at L4-L5 with a grade 1 anterolisthesis.  Her blood pressure has been controlled at home, today their BP is BP: 118/66 She does workout, she is going to the [facility], she is lifting weights and walking.  She denies chest pain, shortness of breath, dizziness.   Husband has leukemia, they are monitoring it.   She is on cholesterol medication and denies myalgias. Her cholesterol is at goal. 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 Last A1C in the office was:  Lab Results  Component Value Date   HGBA1C 6.3 (H) 11/10/2017   Last GFR Lab Results  Component Value Date   GFRNONAA 73 11/10/2017   She is on fosamax for osteporosis x  06/2017.  Patient is on Vitamin D supplement.   Lab Results  Component Value Date   VD25OH 40 07/10/2015   BMI is Body mass index is 34.87 kg/m., she is working on diet and exercise. Wt Readings from Last 3 Encounters:  02/04/18 200 lb (90.7 kg)  11/10/17 198 lb (89.8 kg)  07/08/17 199 lb 9.6 oz (90.5 kg)    Medication Review: Current Outpatient Medications on File Prior to Visit  Medication Sig Dispense Refill  . alendronate (FOSAMAX) 70 MG tablet Take 1 tablet (70 mg total) by mouth every 7 (seven) days. Take with a full glass of water on an empty stomach. 4 tablet 11  . calcium carbonate (CALCIUM 600) 600 MG TABS tablet Take 600 mg by mouth daily.    . Cholecalciferol (VITAMIN D3) 5000 UNITS TABS Take 5,000 Units by mouth daily.    . cyanocobalamin 100 MCG tablet Take 100 mcg by mouth daily.    . fexofenadine (ALLEGRA) 180 MG tablet Take 1 tablet (180 mg total) by mouth daily. 90 tablet 6  . fluticasone-salmeterol (ADVAIR HFA) 115-21 MCG/ACT inhaler Inhale 2 puffs into the lungs 2 (two) times daily. 1 Inhaler 6  . hydrochlorothiazide (HYDRODIURIL) 25 MG tablet Take 1 tablet (25 mg total) by mouth daily. 90 tablet 2  . MAGNESIUM PO Take 500 mg by mouth daily.     Marland Kitchen omeprazole (PRILOSEC) 40 MG capsule TAKE 1 CAPSULE BY MOUTH EVERY DAY 90 capsule 0  . Red Yeast Rice Extract (RED YEAST RICE PO) Take 2,000 mg by mouth daily.    . Omega 3-6-9 Fatty Acids (OMEGA 3-6-9 COMPLEX) CAPS Take by mouth daily.    . prednisoLONE acetate (PRED FORTE) 1 % ophthalmic suspension 1 drop 4 (four) times daily.    . ranitidine (ZANTAC) 300 MG tablet Take 1 tablet (300 mg total) by mouth at bedtime. (Patient not taking: Reported on 02/04/2018) 60 tablet 1   No current facility-administered medications on file prior to visit.     Allergies: Allergies  Allergen Reactions  . Metronidazole Other (See Comments)    unknown  . Pneumovax 23 [Pneumococcal Vac Polyvalent]     Redness and severe swelling at  injection site  . Ppd [Tuberculin Purified Protein Derivative] Other (See Comments)    + PPD 2013 with NEG CXR 05/2012    Current Problems (verified) has Essential hypertension; GERD (gastroesophageal reflux disease); Mixed hyperlipidemia; Vitamin D deficiency; Asthma; Medication management; Back pain; and Osteoporosis on their problem list.  Screening Tests Immunization History  Administered Date(s) Administered  . Influenza, High Dose Seasonal PF 11/10/2017  . Influenza, Seasonal, Injecte, Preservative Fre 10/17/2015  . Influenza,inj,quad, With Preservative 10/15/2016  . Pneumococcal Polysaccharide-23 02/04/2017  . Pneumococcal-Unspecified 06/27/2013  . Tdap 04/23/2012  . Zoster 07/31/2015    Preventative care: Last colonoscopy: 2009 [facility] DUE this year Pap: Hysterectomy Mammogram:  05/27/2017 DEXA: 05/25/2017 due 2 years started fosamax 06/2017 CXR 03/2016  Names of Other Physician/Practitioners you currently use: 1. [facility] here for primary care 2. Dr. Katy Fitch, eye doctor, last visit 09/30/2017 3. Dr. Mariea Clonts  ,  dentist, last visit 2018 Patient Care Team: Unk Pinto, MD as PCP - General (Internal Medicine) Sypher, Herbie Baltimore, MD (Inactive) as Consulting Physician (Orthopedic Surgery) Monna Fam, MD as Consulting Physician (Ophthalmology) Richmond Campbell, MD as Consulting Physician (Gastroenterology) Cheri Fowler, MD as Consulting Physician (Obstetrics and Gynecology) Gaynelle Arabian, MD as Consulting Physician (Orthopedic Surgery) Marchia Bond, MD as Consulting Physician (Orthopedic Surgery) Mosetta Anis, MD as Referring Physician (Allergy) Larey Dresser, MD as Consulting Physician (Cardiology)  Surgical: She  has a past surgical history that includes Bilateral salpingoophorectomy (11/28/2010); Knee arthroscopy (Bilateral, 2005-2016); Colonoscopy; Carpal tunnel release (07/15/2012); Shoulder arthroscopy with rotator  cuff repair and subacromial decompression (Right, 01/20/2013); Abdominal hysterectomy; Carpal tunnel release (Right, 05/31/2014); and Tubal ligation. Family Her family history includes Diabetes in her brother and mother; Heart disease in her brother; Hyperlipidemia in her brother; Hypertension in her father and mother; Stroke in her mother. Social history  She reports that  has never smoked. she has never used smokeless tobacco. She reports that she does not drink alcohol or use drugs.  MEDICARE WELLNESS OBJECTIVES: Physical activity: Current Exercise Habits: Home exercise routine, Type of exercise: walking, Time (Minutes): 20, Frequency (Times/Week): 3, Weekly Exercise (Minutes/Week): 60, Intensity: Mild Cardiac risk factors: Cardiac Risk Factors include: advanced age (>69men, >67 women);diabetes mellitus;dyslipidemia;hypertension;obesity (BMI >30kg/m2);sedentary lifestyle Depression/mood screen:   Depression screen [facility] 2/9 02/04/2018  Decreased Interest 0  Down, Depressed, Hopeless 0  PHQ - 2 Score 0    ADLs:  In your present state of health, do you have any difficulty performing the following activities: 02/04/2018 02/04/2017  Hearing? N N  Vision? N Y  Comment - due cataract surgery  Difficulty concentrating or making decisions? N N  Walking or climbing stairs? N N  Dressing or bathing? N N  Doing errands, shopping? N N  Preparing Food and eating ? - N  Using the Toilet? - N  In the past six months, have you accidently leaked urine? - N  Do you have problems with loss of bowel control? - N  Managing your Medications? - N  Managing your Finances? - N  Housekeeping or managing your Housekeeping? - N  Some recent data might be hidden     Cognitive Testing  Alert? Yes  Normal Appearance?Yes  Oriented to person? Yes  Place? Yes   Time? Yes  Recall of three objects?  Yes  Can perform simple calculations? Yes  Displays appropriate judgment?Yes  Can read the correct time from a watch  face?Yes  EOL planning: Does Patient Have a Medical Advance Directive?: No Would patient like information on creating a medical advance directive?: Yes (MAU/Ambulatory/Procedural Areas - Information given)     Objective:   Today's Vitals   02/04/18 1034  BP: 118/66  Pulse: 80  Temp: 98.1 F (36.7 C)  SpO2: 98%  Weight: 200 lb (90.7 kg)  Height: 5' 3.5" (1.613 m)  PainSc: 9   PainLoc: Back   Body mass index is 34.87 kg/m.  General appearance: alert, no distress, WD/WN, female HEENT: normocephalic, sclerae anicteric, TMs pearly, nares patent, no discharge or erythema, pharynx normal Oral cavity: MMM, no lesions Neck: supple, no lymphadenopathy, no thyromegaly, no masses Heart: RRR, normal S1, S2, no murmurs Lungs: CTA bilaterally, no wheezes, rhonchi, or rales Abdomen: +bs, soft, non tender, non distended, no masses, no hepatomegaly, no splenomegaly Musculoskeletal: nontender, no swelling, no obvious deformity, Patient is able to ambulate well. Gait is  Antalgic. Straight leg raising with dorsiflexion negative bilaterally for  radicular symptoms. Sensory exam in the legs are normal. Knee reflexes are normal Ankle reflexes are normal Strength is normal and symmetric in arms and legs. There is not SI tenderness to palpation.  There is paraspinal muscle spasm.  There is not midline tenderness.  ROM of spine with  limited in all spheres due to pain.  Extremities: no edema, no cyanosis, no clubbing Pulses: 2+ symmetric, upper and lower extremities, normal cap refill Neurological: alert, oriented x 3, CN2-12 intact, strength normal upper extremities and lower extremities, sensation normal throughout, DTRs 2+ throughout, no cerebellar signs, gait normal Psychiatric: normal affect, behavior normal, pleasant    Medicare Attestation I have personally reviewed: The patient's medical and social history Their use of alcohol, tobacco or illicit drugs Their current medications and  supplements The patient's functional ability including ADLs,fall risks, home safety risks, cognitive, and hearing and visual impairment Diet and physical activities Evidence for depression or mood disorders  The patient's weight, height, BMI, and visual acuity have been recorded in the chart.  I have made referrals, counseling, and provided education to the patient based on review of the above and I have provided the patient with a written personalized care plan for preventive services.     Vicie Mutters, PA-C   02/04/2018

## 2018-02-04 NOTE — Patient Instructions (Signed)
Call Dr. Earlean Shawl to set up colonoscopy Phone: [phone];   Try the exercises and other information in the back care manual, meloxicam once during the day as needed (avoid taking other NSAIDS like Alleve or Ibuprofen while taking this) and then flexeril if needed at bedtime for muscle spasm. This can be taken up to every 8 hours, but causes sedation, so should not drive or operate heavy machinery while taking this medicine.   Go to the ER if you have any new weakness in your legs, have trouble controlling your urine or bowels, or have worsening pain.   We will refer you to ortho   Back pain Rehab Ask your health care provider which exercises are safe for you. Do exercises exactly as told by your health care provider and adjust them as directed. It is normal to feel mild stretching, pulling, tightness, or discomfort as you do these exercises, but you should stop right away if you feel sudden pain or your pain gets worse.Do not begin these exercises until told by your health care provider. Stretching and range of motion exercises These exercises warm up your muscles and joints and improve the movement and flexibility of your hips and your back. These exercises also help to relieve pain, numbness, and tingling. Exercise A: Sciatic nerve glide 1. Sit in a chair with your head facing down toward your chest. Place your hands behind your back. Let your shoulders slump forward. 2. Slowly straighten one of your knees while you tilt your head back as if you are looking toward the ceiling. Only straighten your leg as far as you can without making your symptoms worse. 3. Hold for __________ seconds. 4. Slowly return to the starting position. 5. Repeat with your other leg. Repeat __________ times. Complete this exercise __________ times a day. Exercise B: Knee to chest with hip adduction and internal rotation  1. Lie on your back on a firm surface with both legs straight. 2. Bend one of your knees and  move it up toward your chest until you feel a gentle stretch in your lower back and buttock. Then, move your knee toward the shoulder that is on the opposite side from your leg. ? Hold your leg in this position by holding onto the front of your knee. 3. Hold for __________ seconds. 4. Slowly return to the starting position. 5. Repeat with your other leg. Repeat __________ times. Complete this exercise __________ times a day. Exercise C: Prone extension on elbows  1. Lie on your abdomen on a firm surface. A bed may be too soft for this exercise. 2. Prop yourself up on your elbows. 3. Use your arms to help lift your chest up until you feel a gentle stretch in your abdomen and your lower back. ? This will place some of your body weight on your elbows. If this is uncomfortable, try stacking pillows under your chest. ? Your hips should stay down, against the surface that you are lying on. Keep your hip and back muscles relaxed. 4. Hold for __________ seconds. 5. Slowly relax your upper body and return to the starting position. Repeat __________ times. Complete this exercise __________ times a day. Strengthening exercises These exercises build strength and endurance in your back. Endurance is the ability to use your muscles for a long time, even after they get tired. Exercise D: Pelvic tilt 1. Lie on your back on a firm surface. Bend your knees and keep your feet flat. 2. Tense your abdominal muscles. Tip  your pelvis up toward the ceiling and flatten your lower back into the floor. ? To help with this exercise, you may place a small towel under your lower back and try to push your back into the towel. 3. Hold for __________ seconds. 4. Let your muscles relax completely before you repeat this exercise. Repeat __________ times. Complete this exercise __________ times a day. Exercise E: Alternating arm and leg raises  1. Get on your hands and knees on a firm surface. If you are on a hard floor, you  may want to use padding to cushion your knees, such as an exercise mat. 2. Line up your arms and legs. Your hands should be below your shoulders, and your knees should be below your hips. 3. Lift your left leg behind you. At the same time, raise your right arm and straighten it in front of you. ? Do not lift your leg higher than your hip. ? Do not lift your arm higher than your shoulder. ? Keep your abdominal and back muscles tight. ? Keep your hips facing the ground. ? Do not arch your back. ? Keep your balance carefully, and do not hold your breath. 4. Hold for __________ seconds. 5. Slowly return to the starting position and repeat with your right leg and your left arm. Repeat __________ times. Complete this exercise __________ times a day. Posture and body mechanics  Body mechanics refers to the movements and positions of your body while you do your daily activities. Posture is part of body mechanics. Good posture and healthy body mechanics can help to relieve stress in your body's tissues and joints. Good posture means that your spine is in its natural S-curve position (your spine is neutral), your shoulders are pulled back slightly, and your head is not tipped forward. The following are general guidelines for applying improved posture and body mechanics to your everyday activities. Standing   When standing, keep your spine neutral and your feet about hip-width apart. Keep a slight bend in your knees. Your ears, shoulders, and hips should line up.  When you do a task in which you stand in one place for a long time, place one foot up on a stable object that is 2-4 inches (5-10 cm) high, such as a footstool. This helps keep your spine neutral. Sitting   When sitting, keep your spine neutral and keep your feet flat on the floor. Use a footrest, if necessary, and keep your thighs parallel to the floor. Avoid rounding your shoulders, and avoid tilting your head forward.  When working at a  desk or a computer, keep your desk at a height where your hands are slightly lower than your elbows. Slide your chair under your desk so you are close enough to maintain good posture.  When working at a computer, place your monitor at a height where you are looking straight ahead and you do not have to tilt your head forward or downward to look at the screen. Resting   When lying down and resting, avoid positions that are most painful for you.  If you have pain with activities such as sitting, bending, stooping, or squatting (flexion-based activities), lie in a position in which your body does not bend very much. For example, avoid curling up on your side with your arms and knees near your chest (fetal position).  If you have pain with activities such as standing for a long time or reaching with your arms (extension-based activities), lie with your spine  in a neutral position and bend your knees slightly. Try the following positions: ? Lying on your side with a pillow between your knees. ? Lying on your back with a pillow under your knees. Lifting   When lifting objects, keep your feet at least shoulder-width apart and tighten your abdominal muscles.  Bend your knees and hips and keep your spine neutral. It is important to lift using the strength of your legs, not your back. Do not lock your knees straight out.  Always ask for help to lift heavy or awkward objects. This information is not intended to replace advice given to you by your health care provider. Make sure you discuss any questions you have with your health care provider. Document Released: 11/16/2005 Document Revised: 07/23/2016 Document Reviewed: 08/02/2015 Elsevier Interactive Patient Education  [employer].

## 2018-02-05 LAB — CBC WITH DIFFERENTIAL/PLATELET
BASOS PCT: 0.3 %
Basophils Absolute: 18 cells/uL (ref 0–200)
EOS ABS: 120 {cells}/uL (ref 15–500)
Eosinophils Relative: 2 %
HEMATOCRIT: 39 % (ref 35.0–45.0)
Hemoglobin: 12.3 g/dL (ref 11.7–15.5)
LYMPHS ABS: 2874 {cells}/uL (ref 850–3900)
MCH: 26.6 pg — AB (ref 27.0–33.0)
MCHC: 31.5 g/dL — ABNORMAL LOW (ref 32.0–36.0)
MCV: 84.4 fL (ref 80.0–100.0)
MPV: 11.6 fL (ref 7.5–12.5)
Monocytes Relative: 5.5 %
NEUTROS PCT: 44.3 %
Neutro Abs: 2658 cells/uL (ref 1500–7800)
PLATELETS: 305 10*3/uL (ref 140–400)
RBC: 4.62 10*6/uL (ref 3.80–5.10)
RDW: 13.4 % (ref 11.0–15.0)
TOTAL LYMPHOCYTE: 47.9 %
WBC: 6 10*3/uL (ref 3.8–10.8)
WBCMIX: 330 {cells}/uL (ref 200–950)

## 2018-02-05 LAB — URINALYSIS, ROUTINE W REFLEX MICROSCOPIC
BILIRUBIN URINE: NEGATIVE
GLUCOSE, UA: NEGATIVE
Hgb urine dipstick: NEGATIVE
KETONES UR: NEGATIVE
Leukocytes, UA: NEGATIVE
Nitrite: NEGATIVE
PROTEIN: NEGATIVE
Specific Gravity, Urine: 1.004 (ref 1.001–1.03)
pH: 6.5 (ref 5.0–8.0)

## 2018-02-05 LAB — BASIC METABOLIC PANEL WITH GFR
BUN: 15 mg/dL (ref 7–25)
CO2: 30 mmol/L (ref 20–32)
CREATININE: 0.94 mg/dL (ref 0.50–0.99)
Calcium: 9.5 mg/dL (ref 8.6–10.4)
Chloride: 100 mmol/L (ref 98–110)
GFR, EST AFRICAN AMERICAN: 73 mL/min/{1.73_m2} (ref >=60)
GFR, EST NON AFRICAN AMERICAN: 63 mL/min/{1.73_m2} (ref >=60)
Glucose, Bld: 104 mg/dL — ABNORMAL HIGH (ref 65–99)
Potassium: 4.1 mmol/L (ref 3.5–5.3)
Sodium: 139 mmol/L (ref 135–146)

## 2018-02-05 LAB — HEPATIC FUNCTION PANEL
AG RATIO: 1.4 (calc) (ref 1.0–2.5)
ALBUMIN MSPROF: 4.1 g/dL (ref 3.6–5.1)
ALT: 14 U/L (ref 6–29)
AST: 17 U/L (ref 10–35)
Alkaline phosphatase (APISO): 117 U/L (ref 33–130)
BILIRUBIN DIRECT: 0.1 mg/dL (ref 0.0–0.2)
BILIRUBIN INDIRECT: 0.4 mg/dL (ref 0.2–1.2)
BILIRUBIN TOTAL: 0.5 mg/dL (ref 0.2–1.2)
Globulin: 2.9 g/dL (calc) (ref 1.9–3.7)
Total Protein: 7 g/dL (ref 6.1–8.1)

## 2018-02-05 LAB — URINE CULTURE
MICRO NUMBER:: 90300869
Result:: NO GROWTH
SPECIMEN QUALITY: ADEQUATE

## 2018-02-05 LAB — LIPID PANEL
CHOL/HDL RATIO: 3 (calc) (ref <5.0)
Cholesterol: 196 mg/dL (ref <200)
HDL: 66 mg/dL (ref >50)
LDL CHOLESTEROL (CALC): 100 mg/dL — AB
NON-HDL CHOLESTEROL (CALC): 130 mg/dL — AB (ref <130)
Triglycerides: 183 mg/dL — ABNORMAL HIGH (ref <150)

## 2018-02-05 LAB — MAGNESIUM: Magnesium: 1.8 mg/dL (ref 1.5–2.5)

## 2018-02-05 LAB — HEMOGLOBIN A1C
HEMOGLOBIN A1C: 6.5 %{Hb} — AB (ref <5.7)
Mean Plasma Glucose: 140 (calc)
eAG (mmol/L): 7.7 (calc)

## 2018-02-05 LAB — TSH: TSH: 2.05 m[IU]/L (ref 0.40–4.50)

## 2018-02-09 DIAGNOSIS — J3089 Other allergic rhinitis: Secondary | ICD-10-CM | POA: Diagnosis not present

## 2018-02-09 DIAGNOSIS — J301 Allergic rhinitis due to pollen: Secondary | ICD-10-CM | POA: Diagnosis not present

## 2018-02-15 DIAGNOSIS — J301 Allergic rhinitis due to pollen: Secondary | ICD-10-CM | POA: Diagnosis not present

## 2018-02-15 DIAGNOSIS — J3089 Other allergic rhinitis: Secondary | ICD-10-CM | POA: Diagnosis not present

## 2018-02-16 ENCOUNTER — Other Ambulatory Visit: Payer: Self-pay

## 2018-02-16 MED ORDER — HYDROCHLOROTHIAZIDE 25 MG PO TABS: 25.0000 mg | ORAL_TABLET | Freq: Every day | ORAL | 2 refills | Status: DC

## 2018-02-23 DIAGNOSIS — J301 Allergic rhinitis due to pollen: Secondary | ICD-10-CM | POA: Diagnosis not present

## 2018-02-23 DIAGNOSIS — J3089 Other allergic rhinitis: Secondary | ICD-10-CM | POA: Diagnosis not present

## 2018-02-24 ENCOUNTER — Ambulatory Visit (INDEPENDENT_AMBULATORY_CARE_PROVIDER_SITE_OTHER): Payer: Medicare Other

## 2018-02-24 ENCOUNTER — Encounter (INDEPENDENT_AMBULATORY_CARE_PROVIDER_SITE_OTHER): Payer: Self-pay | Admitting: Orthopedic Surgery

## 2018-02-24 ENCOUNTER — Ambulatory Visit (INDEPENDENT_AMBULATORY_CARE_PROVIDER_SITE_OTHER): Payer: Medicare Other | Admitting: Orthopedic Surgery

## 2018-02-24 VITALS — Ht 63.5 in | Wt 200.0 lb

## 2018-02-24 DIAGNOSIS — M5416 Radiculopathy, lumbar region: Secondary | ICD-10-CM

## 2018-02-24 DIAGNOSIS — IMO0001 Reserved for inherently not codable concepts without codable children: Secondary | ICD-10-CM

## 2018-02-24 MED ORDER — PREDNISONE 10 MG PO TABS: 20.0000 mg | ORAL_TABLET | Freq: Every day | ORAL | 0 refills | Status: DC

## 2018-02-24 NOTE — Progress Notes (Signed)
Office Visit Note   Patient: Lauren Lopez           Date of Birth: 1951-02-27           MRN: [id number] Visit Date: 02/24/2018              Requested by: Vicie Mutters, PA-C [address] [place], [place] [address] PCP: Unk Pinto, MD  Chief Complaint  Patient presents with  . Lower Back - Pain      HPI: Patient is a 67 year old woman who presents with over a month history of lower back pain which radiates down the right lower extremity and into her groin.  Pain worse with lying down trying to sleep at night.  Denies mechanical symptoms in her hip.  Patient has taken muscle relaxant medication over-the-counter anti-inflammatories as well as meloxicam.  Patient states that she has started working out to strengthen her core at the gym.  Patient states she has had symptoms for prolonged period of time but it has been worse over the past month.  Patient states she has been doing her heel cord stretching and this has relieved her foot symptoms.  Assessment & Plan: Visit Diagnoses:  1. Radicular pain of right lower back     Plan: We will start her on prednisone 20 mg with breakfast follow-up in 3 weeks.  She will wean off the prednisone as her symptoms resolved.  Discussed that if we are not showing improvement we could consider an MRI scan of lumbar spine.  Follow-Up Instructions: No follow-ups on file.   Ortho Exam  Patient is alert, oriented, no adenopathy, well-dressed, normal affect, normal respiratory effort. Examination patient has a normal gait.  She has no pain with range of motion of the hip knee or ankle.  She has a negative straight leg raise there is no focal motor weakness in the right lower extremity.  Imaging: No results found. No images are attached to the encounter.  Labs: Lab Results  Component Value Date   HGBA1C 6.5 (H) 02/04/2018   HGBA1C 6.3 (H) 11/10/2017   HGBA1C 6.5 (H) 07/08/2017   ESRSEDRATE 18 04/03/2016   CRP 1.2  (H) 04/03/2016   LABORGA NO GROWTH 02/04/2017    @LABSALLVALUES (HGBA1)@  Body mass index is 34.87 kg/m.  Orders:  Orders Placed This Encounter  Procedures  . XR Lumbar Spine 2-3 Views   No orders of the defined types were placed in this encounter.    Procedures: No procedures performed  Clinical Data: No additional findings.  ROS:  All other systems negative, except as noted in the HPI. Review of Systems  Objective: Vital Signs: Ht 5' 3.5" (1.613 m)   Wt 200 lb (90.7 kg)   BMI 34.87 kg/m   Specialty Comments:  No specialty comments available.  PMFS History: Patient Active Problem List   Diagnosis Date Noted  . Osteoporosis 06/01/2017  . Back pain 04/05/2016  . Medication management 01/29/2015  . GERD (gastroesophageal reflux disease)   . Mixed hyperlipidemia   . Vitamin D deficiency   . Asthma   . Essential hypertension 08/09/2009   Past Medical History:  Diagnosis Date  . Anemia   . Arthritis    "right hand, back" (04/03/2016)  . Asthma   . Bilateral carpal tunnel syndrome   . Chronic lower back pain   . GERD (gastroesophageal reflux disease)   . Hyperlipidemia   . Hypertension   . Multiple allergies   . Pneumonia 03/2012  .  Positive PPD    "they told me it wasn't positive; I was allergic to the test itself"  . Type II diabetes mellitus ([place])   . Vitamin deficiency   . Wears dentures    upper  . Wears glasses     Family History  Problem Relation Age of Onset  . Hypertension Mother   . Diabetes Mother   . Stroke Mother   . Hypertension Father   . Heart disease Brother   . Hyperlipidemia Brother   . Diabetes Brother     Past Surgical History:  Procedure Laterality Date  . ABDOMINAL HYSTERECTOMY    . BILATERAL SALPINGOOPHORECTOMY  11/28/2010   open laparoscopy with adhesiolysis also  . CARPAL TUNNEL RELEASE  07/15/2012   Procedure: CARPAL TUNNEL RELEASE;  Surgeon: Cammie Sickle., MD;  Location: [facility];  Service:  Orthopedics;  Laterality: Left;  . CARPAL TUNNEL RELEASE Right 05/31/2014   Procedure: RIGHT CARPAL TUNNEL RELEASE;  Surgeon: Cammie Sickle, MD;  Location: [facility];  Service: Orthopedics;  Laterality: Right;  . COLONOSCOPY    . KNEE ARTHROSCOPY Bilateral 2005-2016   "right-left"  . SHOULDER ARTHROSCOPY WITH ROTATOR CUFF REPAIR AND SUBACROMIAL DECOMPRESSION Right 01/20/2013   Procedure: RIGHT ARTHROSCOPY SHOULDER DEBRIDEMENT LIMITED, ARTHROSCOPY SHOULDER DECOMPRESSION SUBACROMIAL PARTIAL ACROMIOPLASTY WITH CORACOACROMIAL RELEASE, ROTATOR CUFF REPAIR ;  Surgeon: Johnny Bridge, MD;  Location: [facility];  Service: Orthopedics;  Laterality: Right;  RIGHT SHOULDER SCOPE DEBRIDEMENT, ACRIOMIOPLASTY, ROTATOR CUFF REPAIR  . TUBAL LIGATION     Social History   Occupational History  . Not on file  Tobacco Use  . Smoking status: Never Smoker  . Smokeless tobacco: Never Used  Substance and Sexual Activity  . Alcohol use: No  . Drug use: No  . Sexual activity: Not Currently

## 2018-02-25 ENCOUNTER — Telehealth (INDEPENDENT_AMBULATORY_CARE_PROVIDER_SITE_OTHER): Payer: Self-pay | Admitting: Orthopedic Surgery

## 2018-02-25 NOTE — Telephone Encounter (Signed)
Patient has some questions about the prednisone she is supposed to be taking, she said the directions are to take 2 with breakfast each morning but she is asking how many days she should do this. Dr. Sharol Given told her to come down to 1 a day she just wants to know at what point. She is proceeding with taking 2 this morning, please advise # [phone]

## 2018-02-25 NOTE — Telephone Encounter (Signed)
I called and advised pt that per the office visit note Dr. Sharol Given had advised to take until the symptoms resolve and then can wean off ( she will take one for a few days and then stop) and she will call with questions.

## 2018-03-02 DIAGNOSIS — E119 Type 2 diabetes mellitus without complications: Secondary | ICD-10-CM | POA: Diagnosis not present

## 2018-03-02 DIAGNOSIS — H35353 Cystoid macular degeneration, bilateral: Secondary | ICD-10-CM | POA: Diagnosis not present

## 2018-03-02 DIAGNOSIS — H401131 Primary open-angle glaucoma, bilateral, mild stage: Secondary | ICD-10-CM | POA: Diagnosis not present

## 2018-03-02 LAB — HM DIABETES EYE EXAM

## 2018-03-03 DIAGNOSIS — J3089 Other allergic rhinitis: Secondary | ICD-10-CM | POA: Diagnosis not present

## 2018-03-03 DIAGNOSIS — J301 Allergic rhinitis due to pollen: Secondary | ICD-10-CM | POA: Diagnosis not present

## 2018-03-08 DIAGNOSIS — J301 Allergic rhinitis due to pollen: Secondary | ICD-10-CM | POA: Diagnosis not present

## 2018-03-08 DIAGNOSIS — J3089 Other allergic rhinitis: Secondary | ICD-10-CM | POA: Diagnosis not present

## 2018-03-14 DIAGNOSIS — J301 Allergic rhinitis due to pollen: Secondary | ICD-10-CM | POA: Diagnosis not present

## 2018-03-14 DIAGNOSIS — J3089 Other allergic rhinitis: Secondary | ICD-10-CM | POA: Diagnosis not present

## 2018-03-17 ENCOUNTER — Ambulatory Visit (INDEPENDENT_AMBULATORY_CARE_PROVIDER_SITE_OTHER): Payer: Medicare HMO | Admitting: Orthopedic Surgery

## 2018-03-17 ENCOUNTER — Encounter (INDEPENDENT_AMBULATORY_CARE_PROVIDER_SITE_OTHER): Payer: Self-pay | Admitting: Orthopedic Surgery

## 2018-03-17 VITALS — Ht 63.0 in | Wt 200.0 lb

## 2018-03-17 DIAGNOSIS — IMO0001 Reserved for inherently not codable concepts without codable children: Secondary | ICD-10-CM

## 2018-03-17 DIAGNOSIS — M5416 Radiculopathy, lumbar region: Secondary | ICD-10-CM | POA: Diagnosis not present

## 2018-03-17 NOTE — Progress Notes (Signed)
Office Visit Note   Patient: Lauren Lopez           Date of Birth: 05-Feb-1951           MRN: [id number] Visit Date: 03/17/2018              Requested by: Unk Pinto, [place], [place] [address] PCP: Unk Pinto, MD  Chief Complaint  Patient presents with  . Lower Back - Pain      HPI: Patient is a 67 year old woman who presents in follow-up for lumbar spine radicular pain down the lateral aspect of the right hip and also radiating into her groin.  Patient states the pain is worse when she lays down she start with 20 mg of prednisone and decreased to 10 mg and she states that pain has reoccurred.  Pain primarily radiating from her back to her groin not related to weightbearing activities.  Assessment & Plan: Visit Diagnoses:  1. Radicular pain of right lower back     Plan: We will request an MRI scan of the lumbar spine continue with 10 mg of prednisone.  Follow-Up Instructions: Return if symptoms worsen or fail to improve.   Ortho Exam  Patient is alert, oriented, no adenopathy, well-dressed, normal affect, normal respiratory effort. Examination patient has negative straight leg raise she has no pain with range of motion of the hip knee or ankle.  She has no focal motor weakness in the right lower extremity.  Imaging: No results found. No images are attached to the encounter.  Labs: Lab Results  Component Value Date   HGBA1C 6.5 (H) 02/04/2018   HGBA1C 6.3 (H) 11/10/2017   HGBA1C 6.5 (H) 07/08/2017   ESRSEDRATE 18 04/03/2016   CRP 1.2 (H) 04/03/2016   LABORGA NO GROWTH 02/04/2017    @LABSALLVALUES (HGBA1)@  Body mass index is 35.43 kg/m.  Orders:  Orders Placed This Encounter  Procedures  . MR Lumbar Spine w/o contrast   No orders of the defined types were placed in this encounter.    Procedures: No procedures performed  Clinical Data: No additional findings.  ROS:  All other systems negative,  except as noted in the HPI. Review of Systems  Objective: Vital Signs: Ht 5\' 3"  (1.6 m)   Wt 200 lb (90.7 kg)   BMI 35.43 kg/m   Specialty Comments:  No specialty comments available.  PMFS History: Patient Active Problem List   Diagnosis Date Noted  . Osteoporosis 06/01/2017  . Back pain 04/05/2016  . Medication management 01/29/2015  . GERD (gastroesophageal reflux disease)   . Mixed hyperlipidemia   . Vitamin D deficiency   . Asthma   . Essential hypertension 08/09/2009   Past Medical History:  Diagnosis Date  . Anemia   . Arthritis    "right hand, back" (04/03/2016)  . Asthma   . Bilateral carpal tunnel syndrome   . Chronic lower back pain   . GERD (gastroesophageal reflux disease)   . Hyperlipidemia   . Hypertension   . Multiple allergies   . Pneumonia 03/2012  . Positive PPD    "they told me it wasn't positive; I was allergic to the test itself"  . Type II diabetes mellitus ([place])   . Vitamin deficiency   . Wears dentures    upper  . Wears glasses     Family History  Problem Relation Age of Onset  . Hypertension Mother   . Diabetes Mother   . Stroke Mother   .  Hypertension Father   . Heart disease Brother   . Hyperlipidemia Brother   . Diabetes Brother     Past Surgical History:  Procedure Laterality Date  . ABDOMINAL HYSTERECTOMY    . BILATERAL SALPINGOOPHORECTOMY  11/28/2010   open laparoscopy with adhesiolysis also  . CARPAL TUNNEL RELEASE  07/15/2012   Procedure: CARPAL TUNNEL RELEASE;  Surgeon: Cammie Sickle., MD;  Location: [facility];  Service: Orthopedics;  Laterality: Left;  . CARPAL TUNNEL RELEASE Right 05/31/2014   Procedure: RIGHT CARPAL TUNNEL RELEASE;  Surgeon: Cammie Sickle, MD;  Location: [facility];  Service: Orthopedics;  Laterality: Right;  . COLONOSCOPY    . KNEE ARTHROSCOPY Bilateral 2005-2016   "right-left"  . SHOULDER ARTHROSCOPY WITH ROTATOR CUFF REPAIR AND SUBACROMIAL DECOMPRESSION Right  01/20/2013   Procedure: RIGHT ARTHROSCOPY SHOULDER DEBRIDEMENT LIMITED, ARTHROSCOPY SHOULDER DECOMPRESSION SUBACROMIAL PARTIAL ACROMIOPLASTY WITH CORACOACROMIAL RELEASE, ROTATOR CUFF REPAIR ;  Surgeon: Johnny Bridge, MD;  Location: [facility];  Service: Orthopedics;  Laterality: Right;  RIGHT SHOULDER SCOPE DEBRIDEMENT, ACRIOMIOPLASTY, ROTATOR CUFF REPAIR  . TUBAL LIGATION     Social History   Occupational History  . Not on file  Tobacco Use  . Smoking status: Never Smoker  . Smokeless tobacco: Never Used  Substance and Sexual Activity  . Alcohol use: No  . Drug use: No  . Sexual activity: Not Currently

## 2018-03-22 DIAGNOSIS — J3089 Other allergic rhinitis: Secondary | ICD-10-CM | POA: Diagnosis not present

## 2018-03-22 DIAGNOSIS — J301 Allergic rhinitis due to pollen: Secondary | ICD-10-CM | POA: Diagnosis not present

## 2018-03-23 ENCOUNTER — Other Ambulatory Visit (INDEPENDENT_AMBULATORY_CARE_PROVIDER_SITE_OTHER): Payer: Self-pay | Admitting: Orthopedic Surgery

## 2018-03-29 ENCOUNTER — Other Ambulatory Visit (INDEPENDENT_AMBULATORY_CARE_PROVIDER_SITE_OTHER): Payer: Self-pay

## 2018-03-29 DIAGNOSIS — IMO0001 Reserved for inherently not codable concepts without codable children: Secondary | ICD-10-CM

## 2018-03-30 ENCOUNTER — Telehealth (INDEPENDENT_AMBULATORY_CARE_PROVIDER_SITE_OTHER): Payer: Self-pay | Admitting: Orthopedic Surgery

## 2018-03-30 DIAGNOSIS — J3089 Other allergic rhinitis: Secondary | ICD-10-CM | POA: Diagnosis not present

## 2018-03-30 NOTE — Telephone Encounter (Signed)
Faxed referral

## 2018-03-30 NOTE — Telephone Encounter (Signed)
Lauren Lopez with [facility] called and asked if we could fax over orders for PT for this patient, she has not received it. They already have her on the schedule for next Tuesday. Their fax # [phone]

## 2018-04-05 DIAGNOSIS — M5416 Radiculopathy, lumbar region: Secondary | ICD-10-CM | POA: Diagnosis not present

## 2018-04-05 DIAGNOSIS — M25551 Pain in right hip: Secondary | ICD-10-CM | POA: Diagnosis not present

## 2018-04-05 DIAGNOSIS — M545 Low back pain: Secondary | ICD-10-CM | POA: Diagnosis not present

## 2018-04-06 DIAGNOSIS — J3089 Other allergic rhinitis: Secondary | ICD-10-CM | POA: Diagnosis not present

## 2018-04-06 DIAGNOSIS — J301 Allergic rhinitis due to pollen: Secondary | ICD-10-CM | POA: Diagnosis not present

## 2018-04-08 DIAGNOSIS — M545 Low back pain: Secondary | ICD-10-CM | POA: Diagnosis not present

## 2018-04-08 DIAGNOSIS — M5416 Radiculopathy, lumbar region: Secondary | ICD-10-CM | POA: Diagnosis not present

## 2018-04-08 DIAGNOSIS — M25551 Pain in right hip: Secondary | ICD-10-CM | POA: Diagnosis not present

## 2018-04-12 ENCOUNTER — Encounter: Payer: Self-pay | Admitting: Internal Medicine

## 2018-04-12 DIAGNOSIS — M545 Low back pain: Secondary | ICD-10-CM | POA: Diagnosis not present

## 2018-04-12 DIAGNOSIS — M5416 Radiculopathy, lumbar region: Secondary | ICD-10-CM | POA: Diagnosis not present

## 2018-04-12 DIAGNOSIS — M25551 Pain in right hip: Secondary | ICD-10-CM | POA: Diagnosis not present

## 2018-04-14 DIAGNOSIS — J3089 Other allergic rhinitis: Secondary | ICD-10-CM | POA: Diagnosis not present

## 2018-04-14 DIAGNOSIS — J301 Allergic rhinitis due to pollen: Secondary | ICD-10-CM | POA: Diagnosis not present

## 2018-04-15 DIAGNOSIS — M5416 Radiculopathy, lumbar region: Secondary | ICD-10-CM | POA: Diagnosis not present

## 2018-04-15 DIAGNOSIS — M25551 Pain in right hip: Secondary | ICD-10-CM | POA: Diagnosis not present

## 2018-04-15 DIAGNOSIS — M545 Low back pain: Secondary | ICD-10-CM | POA: Diagnosis not present

## 2018-04-18 ENCOUNTER — Ambulatory Visit (INDEPENDENT_AMBULATORY_CARE_PROVIDER_SITE_OTHER): Payer: Medicare HMO

## 2018-04-18 ENCOUNTER — Encounter (INDEPENDENT_AMBULATORY_CARE_PROVIDER_SITE_OTHER): Payer: Self-pay | Admitting: Orthopedic Surgery

## 2018-04-18 ENCOUNTER — Ambulatory Visit (INDEPENDENT_AMBULATORY_CARE_PROVIDER_SITE_OTHER): Payer: Medicare HMO | Admitting: Orthopedic Surgery

## 2018-04-18 VITALS — Ht 63.0 in | Wt 200.0 lb

## 2018-04-18 DIAGNOSIS — M25551 Pain in right hip: Secondary | ICD-10-CM

## 2018-04-18 NOTE — Addendum Note (Signed)
Addended by: Pamella Pert on: 04/18/2018 05:30 PM   Modules accepted: Orders

## 2018-04-18 NOTE — Progress Notes (Signed)
Office Visit Note   Patient: EVVIE BEHRMANN           Date of Birth: 1951/08/21           MRN: [id number] Visit Date: 04/18/2018              Requested by: Unk Pinto, [place],  [address] PCP: Unk Pinto, MD  Chief Complaint  Patient presents with  . Right Hip - Pain      HPI: Patient is a 67 year old woman with lower back pain radiating to the buttock as well as groin pain on the right.  Patient was sent to physical therapy to work on the back radicular symptoms and specific maneuvers for the hip reproduced her groin pain it was felt that most of her pain may be coming from the hip and not the back.  Patient is seen for reevaluation.  Assessment & Plan: Visit Diagnoses:  1. Pain in right hip     Plan: We will set patient up for a steroid injection for the right hip and then reevaluate in 4 weeks.  Discussed that if we still have back and buttocks pain we would need to proceed with an MRI scan we will see the symptomatically what her improvement is after the injection.  Follow-Up Instructions: Return if symptoms worsen or fail to improve.   Ortho Exam  Patient is alert, oriented, no adenopathy, well-dressed, normal affect, normal respiratory effort. Examination patient does have a slight abductor lurch on the right with swinging her body over the right hip.  Patient complains of pain with start up pain with prolonged inactivity.  Examination manually she has 45 degrees of internal and external rotation and this is not painful however with maximal stressing this does reproduce some groin pain she has a negative straight leg raise she has no focal motor weakness.  Imaging: Xr Hip Unilat W Or W/o Pelvis 2-3 Views Right  Result Date: 04/18/2018 2 view radiographs of the right hip shows decreased joint space which is symmetric bilaterally.  There is no subcondylar cysts or sclerosis.  No images are attached to the  encounter.  Labs: Lab Results  Component Value Date   HGBA1C 6.5 (H) 02/04/2018   HGBA1C 6.3 (H) 11/10/2017   HGBA1C 6.5 (H) 07/08/2017   ESRSEDRATE 18 04/03/2016   CRP 1.2 (H) 04/03/2016   LABORGA NO GROWTH 02/04/2017     Lab Results  Component Value Date   ALBUMIN 4.3 07/08/2017   ALBUMIN 4.2 02/04/2017   ALBUMIN 4.3 10/15/2016    Body mass index is 35.43 kg/m.  Orders:  Orders Placed This Encounter  Procedures  . XR HIP UNILAT W OR W/O PELVIS 2-3 VIEWS RIGHT   No orders of the defined types were placed in this encounter.    Procedures: No procedures performed  Clinical Data: No additional findings.  ROS:  All other systems negative, except as noted in the HPI. Review of Systems  Objective: Vital Signs: Ht 5\' 3"  (1.6 m)   Wt 200 lb (90.7 kg)   BMI 35.43 kg/m   Specialty Comments:  No specialty comments available.  PMFS History: Patient Active Problem List   Diagnosis Date Noted  . Osteoporosis 06/01/2017  . Back pain 04/05/2016  . Medication management 01/29/2015  . GERD (gastroesophageal reflux disease)   . Mixed hyperlipidemia   . Vitamin D deficiency   . Asthma   . Essential hypertension 08/09/2009   Past Medical History:  Diagnosis Date  . Anemia   . Arthritis    "right hand, back" (04/03/2016)  . Asthma   . Bilateral carpal tunnel syndrome   . Chronic lower back pain   . GERD (gastroesophageal reflux disease)   . Hyperlipidemia   . Hypertension   . Multiple allergies   . Pneumonia 03/2012  . Positive PPD    "they told me it wasn't positive; I was allergic to the test itself"  . Type II diabetes mellitus ([place])   . Vitamin deficiency   . Wears dentures    upper  . Wears glasses     Family History  Problem Relation Age of Onset  . Hypertension Mother   . Diabetes Mother   . Stroke Mother   . Hypertension Father   . Heart disease Brother   . Hyperlipidemia Brother   . Diabetes Brother     Past Surgical History:  Procedure  Laterality Date  . ABDOMINAL HYSTERECTOMY    . BILATERAL SALPINGOOPHORECTOMY  11/28/2010   open laparoscopy with adhesiolysis also  . CARPAL TUNNEL RELEASE  07/15/2012   Procedure: CARPAL TUNNEL RELEASE;  Surgeon: Cammie Sickle., MD;  Location: [facility];  Service: Orthopedics;  Laterality: Left;  . CARPAL TUNNEL RELEASE Right 05/31/2014   Procedure: RIGHT CARPAL TUNNEL RELEASE;  Surgeon: Cammie Sickle, MD;  Location: [facility];  Service: Orthopedics;  Laterality: Right;  . COLONOSCOPY    . KNEE ARTHROSCOPY Bilateral 2005-2016   "right-left"  . SHOULDER ARTHROSCOPY WITH ROTATOR CUFF REPAIR AND SUBACROMIAL DECOMPRESSION Right 01/20/2013   Procedure: RIGHT ARTHROSCOPY SHOULDER DEBRIDEMENT LIMITED, ARTHROSCOPY SHOULDER DECOMPRESSION SUBACROMIAL PARTIAL ACROMIOPLASTY WITH CORACOACROMIAL RELEASE, ROTATOR CUFF REPAIR ;  Surgeon: Johnny Bridge, MD;  Location: [facility];  Service: Orthopedics;  Laterality: Right;  RIGHT SHOULDER SCOPE DEBRIDEMENT, ACRIOMIOPLASTY, ROTATOR CUFF REPAIR  . TUBAL LIGATION     Social History   Occupational History  . Not on file  Tobacco Use  . Smoking status: Never Smoker  . Smokeless tobacco: Never Used  Substance and Sexual Activity  . Alcohol use: No  . Drug use: No  . Sexual activity: Not Currently

## 2018-04-20 DIAGNOSIS — H1045 Other chronic allergic conjunctivitis: Secondary | ICD-10-CM | POA: Diagnosis not present

## 2018-04-20 DIAGNOSIS — J301 Allergic rhinitis due to pollen: Secondary | ICD-10-CM | POA: Diagnosis not present

## 2018-04-20 DIAGNOSIS — J3089 Other allergic rhinitis: Secondary | ICD-10-CM | POA: Diagnosis not present

## 2018-04-20 DIAGNOSIS — J4530 Mild persistent asthma, uncomplicated: Secondary | ICD-10-CM | POA: Diagnosis not present

## 2018-05-04 DIAGNOSIS — J3089 Other allergic rhinitis: Secondary | ICD-10-CM | POA: Diagnosis not present

## 2018-05-04 DIAGNOSIS — J301 Allergic rhinitis due to pollen: Secondary | ICD-10-CM | POA: Diagnosis not present

## 2018-05-07 ENCOUNTER — Other Ambulatory Visit: Payer: Self-pay | Admitting: Physician Assistant

## 2018-05-07 DIAGNOSIS — M810 Age-related osteoporosis without current pathological fracture: Secondary | ICD-10-CM

## 2018-05-09 ENCOUNTER — Ambulatory Visit (INDEPENDENT_AMBULATORY_CARE_PROVIDER_SITE_OTHER): Payer: Medicare HMO

## 2018-05-09 ENCOUNTER — Ambulatory Visit (INDEPENDENT_AMBULATORY_CARE_PROVIDER_SITE_OTHER): Payer: Medicare HMO | Admitting: Physical Medicine and Rehabilitation

## 2018-05-09 ENCOUNTER — Encounter (INDEPENDENT_AMBULATORY_CARE_PROVIDER_SITE_OTHER): Payer: Self-pay | Admitting: Physical Medicine and Rehabilitation

## 2018-05-09 DIAGNOSIS — M25551 Pain in right hip: Secondary | ICD-10-CM | POA: Diagnosis not present

## 2018-05-09 MED ORDER — TRIAMCINOLONE ACETONIDE 40 MG/ML IJ SUSP
80.0000 mg | INTRAMUSCULAR | Status: AC | PRN
  Administered 2018-05-09: 80 mg via INTRA_ARTICULAR

## 2018-05-09 MED ORDER — BUPIVACAINE HCL 0.5 % IJ SOLN
3.0000 mL | INTRAMUSCULAR | Status: AC | PRN
  Administered 2018-05-09: 3 mL via INTRA_ARTICULAR

## 2018-05-09 NOTE — Progress Notes (Signed)
 .  Numeric Pain Rating Scale and Functional Assessment Average Pain 8   In the last MONTH (on 0-10 scale) has pain interfered with the following?  1. General activity like being  able to carry out your everyday physical activities such as walking, climbing stairs, carrying groceries, or moving a chair?  Rating(6)   +Driver, -BT, -Dye Allergies.  

## 2018-05-09 NOTE — Patient Instructions (Signed)

## 2018-05-09 NOTE — Progress Notes (Signed)
Lauren Lopez - 67 y.o. female MRN [id number]  Date of birth: August 15, 1951  Office Visit Note: Visit Date: 05/09/2018 PCP: Lauren Pinto, MD Referred by: Lauren Pinto, MD  Subjective: Chief Complaint  Patient presents with  . Lower Back - Pain  . Right Hip - Pain  . Right Leg - Pain   HPI: Lauren Lopez is a 67 year old female with right low back and hip pain with some referral into the groin and leg.  She represents an interesting case where her x-rays have revealed very minimal to mild degenerative joint disease on the right compared to left with good joint spacing.  She has lumbar spine films showing small listhesis minimal at L4 on L5 with some degenerative disc height loss at L5-S1.  She has not had an MRI of the lumbar spine or hip.  Initially Lauren Lopez felt like this was a lumbar spine problem and did send her to physical therapy for radiculopathy radiculitis.  Evidently the physical therapy folks thought this is more related to her hip.  On his exam and my exam her pain in the hip is not really reproducible with hip motion.  She does get pain with different movements and standing and walking for a long time.  We are going to complete a diagnostic and hopefully therapeutic anesthetic hip arthrogram.  She does not have hip pain all the time so it may be hard from a diagnostic standpoint today we have gone over at length about the issue with the anesthetic for the next 4 to 5 hours that she should pay attention to.   ROS Otherwise per HPI.  Assessment & Plan: Visit Diagnoses:  1. Pain in right hip     Plan: Findings:  Diagnostic note for therapeutic anesthetic hip arthrogram on the right.  Patient initially had difficulty getting up on the table but seemed to have a decreased amount of pain trying to get off the table.  When she was standing and walking after this it was hard for her to tell any difference in her normal arm.  Her average pain is 8 out of 10 but this is  dependent on activity level and movement.  She is going to keep note today while the hip joint is numb to see if she can reproduce any pain.    Meds & Orders: No orders of the defined types were placed in this encounter.   Orders Placed This Encounter  Procedures  . Large Joint Inj: R hip joint  . XR C-ARM NO REPORT    Follow-up: Return if symptoms worsen or fail to improve.   Procedures: Large Joint Inj: R hip joint on 05/09/2018 10:22 AM Indications: pain and diagnostic evaluation Details: 22 G needle, anterior approach  Arthrogram: Yes  Medications: 80 mg triamcinolone acetonide 40 MG/ML; 3 mL bupivacaine 0.5 % Outcome: tolerated well, no immediate complications  Arthrogram demonstrated excellent flow of contrast throughout the joint surface without extravasation or obvious defect.  The patient had relief of symptoms during the anesthetic phase of the injection.  Procedure, treatment alternatives, risks and benefits explained, specific risks discussed. Consent was Lopez by the patient. Immediately prior to procedure a time out was called to verify the correct patient, procedure, equipment, support staff and site/side marked as required. Patient was prepped and draped in the usual sterile fashion.      No notes on file   Clinical History: No specialty comments available.   She reports that she has never smoked.  She has never used smokeless tobacco.  Recent Labs    07/08/17 1515 11/10/17 1706 02/04/18 1124  HGBA1C 6.5* 6.3* 6.5*    Objective:  VS:  HT:    WT:   BMI:     BP:   HR: bpm  TEMP: ( )  RESP:  Physical Exam  Musculoskeletal:  Normal painless range of motion of the right hip.    Ortho Exam Imaging: Xr C-arm No Report  Result Date: 05/09/2018 Please see Notes or Procedures tab for imaging impression.   Past Medical/Family/Surgical/Social History: Medications & Allergies reviewed per EMR, new medications updated. Patient Active Problem List    Diagnosis Date Noted  . Osteoporosis 06/01/2017  . Back pain 04/05/2016  . Medication management 01/29/2015  . GERD (gastroesophageal reflux disease)   . Mixed hyperlipidemia   . Vitamin D deficiency   . Asthma   . Essential hypertension 08/09/2009   Past Medical History:  Diagnosis Date  . Anemia   . Arthritis    "right hand, back" (04/03/2016)  . Asthma   . Bilateral carpal tunnel syndrome   . Chronic lower back pain   . GERD (gastroesophageal reflux disease)   . Hyperlipidemia   . Hypertension   . Multiple allergies   . Pneumonia 03/2012  . Positive PPD    "they told me it wasn't positive; I was allergic to the test itself"  . Type II diabetes mellitus ([facility])   . Vitamin deficiency   . Wears dentures    upper  . Wears glasses    Family History  Problem Relation Age of Onset  . Hypertension Mother   . Diabetes Mother   . Stroke Mother   . Hypertension Father   . Heart disease Brother   . Hyperlipidemia Brother   . Diabetes Brother    Past Surgical History:  Procedure Laterality Date  . ABDOMINAL HYSTERECTOMY    . BILATERAL SALPINGOOPHORECTOMY  11/28/2010   open laparoscopy with adhesiolysis also  . CARPAL TUNNEL RELEASE  07/15/2012   Procedure: CARPAL TUNNEL RELEASE;  Surgeon: Lauren Lopez., MD;  Location: [facility];  Service: Orthopedics;  Laterality: Left;  . CARPAL TUNNEL RELEASE Right 05/31/2014   Procedure: RIGHT CARPAL TUNNEL RELEASE;  Surgeon: Lauren Sickle, MD;  Location: [facility];  Service: Orthopedics;  Laterality: Right;  . COLONOSCOPY    . KNEE ARTHROSCOPY Bilateral 2005-2016   "right-left"  . SHOULDER ARTHROSCOPY WITH ROTATOR CUFF REPAIR AND SUBACROMIAL DECOMPRESSION Right 01/20/2013   Procedure: RIGHT ARTHROSCOPY SHOULDER DEBRIDEMENT LIMITED, ARTHROSCOPY SHOULDER DECOMPRESSION SUBACROMIAL PARTIAL ACROMIOPLASTY WITH CORACOACROMIAL RELEASE, ROTATOR CUFF REPAIR ;  Surgeon: Lauren Bridge, MD;  Location: [facility];  Service: Orthopedics;  Laterality: Right;  RIGHT SHOULDER SCOPE DEBRIDEMENT, ACRIOMIOPLASTY, ROTATOR CUFF REPAIR  . TUBAL LIGATION     Social History   Occupational History  . Not on file  Tobacco Use  . Smoking status: Never Smoker  . Smokeless tobacco: Never Used  Substance and Sexual Activity  . Alcohol use: No  . Drug use: No  . Sexual activity: Not Currently

## 2018-05-10 DIAGNOSIS — J301 Allergic rhinitis due to pollen: Secondary | ICD-10-CM | POA: Diagnosis not present

## 2018-05-10 DIAGNOSIS — J3089 Other allergic rhinitis: Secondary | ICD-10-CM | POA: Diagnosis not present

## 2018-05-16 DIAGNOSIS — J3089 Other allergic rhinitis: Secondary | ICD-10-CM | POA: Diagnosis not present

## 2018-05-16 DIAGNOSIS — J301 Allergic rhinitis due to pollen: Secondary | ICD-10-CM | POA: Diagnosis not present

## 2018-05-26 ENCOUNTER — Ambulatory Visit: Payer: Self-pay | Admitting: Adult Health

## 2018-05-26 DIAGNOSIS — J301 Allergic rhinitis due to pollen: Secondary | ICD-10-CM | POA: Diagnosis not present

## 2018-05-26 DIAGNOSIS — J3089 Other allergic rhinitis: Secondary | ICD-10-CM | POA: Diagnosis not present

## 2018-05-30 DIAGNOSIS — E669 Obesity, unspecified: Secondary | ICD-10-CM | POA: Insufficient documentation

## 2018-05-30 DIAGNOSIS — E66811 Obesity, class 1: Secondary | ICD-10-CM | POA: Insufficient documentation

## 2018-05-30 NOTE — Progress Notes (Signed)
FOLLOW UP  Assessment and Plan:   Hypertension Well controlled with current medications  Monitor blood pressure at home; patient to call if consistently greater than 130/80 Continue DASH diet.   Reminder to go to the ER if any CP, SOB, nausea, dizziness, severe HA, changes vision/speech, left arm numbness and tingling and jaw pain.  Cholesterol Currently near goal; continue RYRS, discussed initiate statin if progresses Continue low cholesterol diet and exercise.  Check lipid panel.   Prediabetes Borderline diabetic; would like to postpone metformin and work on lifestyle Continue diet and exercise.  Perform daily foot/skin check, notify office of any concerning changes.  Check A1C  Obesity with co morbidities Long discussion about weight loss, diet, and exercise Recommended diet heavy in fruits and veggies and low in animal meats, cheeses, and dairy products, appropriate calorie intake Discussed ideal weight for height and initial weight goal (190 lb) Will follow up in 3 months  Vitamin D Def Below goal at last check which was removed; continue supplementation to maintain goal of 70-100 Check Vit D level  Continue diet and meds as discussed. Further disposition pending results of labs. Discussed med's effects and SE's.   Over 30 minutes of exam, counseling, chart review, and critical decision making was performed.   Future Appointments  Date Time Provider [place]  06/08/2018  2:45 PM Newt Minion, MD [facility] None  11/17/2018  3:00 PM Vicie Mutters, PA-C [facility] None  02/09/2019 10:30 AM Liane Comber, NP [facility] None    ----------------------------------------------------------------------------------------------------------------------  HPI 67 y.o. female  presents for 3 month follow up on hypertension, cholesterol, diabetes, obesity and vitamin D deficiency. R hip pain ongoing, followed by Dr. Sharol Given, discussing MRI.   BMI is Body mass index is 35.43  kg/m., she has been working on diet.  Wt Readings from Last 3 Encounters:  05/31/18 200 lb (90.7 kg)  04/18/18 200 lb (90.7 kg)  03/17/18 200 lb (90.7 kg)   Her blood pressure has been controlled at home, today their BP is BP: 120/68  She does not workout. She denies chest pain, shortness of breath, dizziness.   She is not on cholesterol medication (takes RYRS) and denies myalgias. Her cholesterol is not at goal. The cholesterol last visit was:   Lab Results  Component Value Date   CHOL 196 02/04/2018   HDL 66 02/04/2018   LDLCALC 100 (H) 02/04/2018   TRIG 183 (H) 02/04/2018   CHOLHDL 3.0 02/04/2018    She has been working on diet for borderline T2 diabetes/ prediabetes, and denies foot ulcerations, increased appetite, nausea, paresthesia of the feet, polydipsia, polyuria, visual disturbances, vomiting and weight loss. Last A1C in the office was:  Lab Results  Component Value Date   HGBA1C 6.5 (H) 02/04/2018   Patient is on Vitamin D supplement.   Lab Results  Component Value Date   VD25OH 40 07/10/2015        Current Medications:  Current Outpatient Medications on File Prior to Visit  Medication Sig  . alendronate (FOSAMAX) 70 MG tablet TAKE 1 TABLET EVERY 7 DAYS WITH A FULL GLASS OF WATER ON AN EMPTY STOMACH  . calcium carbonate (CALCIUM 600) 600 MG TABS tablet Take 600 mg by mouth daily.  . Cholecalciferol (VITAMIN D3) 5000 UNITS TABS Take 5,000 Units by mouth daily.  . cyanocobalamin 100 MCG tablet Take 100 mcg by mouth daily.  . cyclobenzaprine (FLEXERIL) 10 MG tablet Take 1 tablet (10 mg total) by mouth at bedtime as needed  for muscle spasms.  . fexofenadine (ALLEGRA) 180 MG tablet Take 1 tablet (180 mg total) by mouth daily.  . fluticasone-salmeterol (ADVAIR HFA) 115-21 MCG/ACT inhaler Inhale 2 puffs into the lungs 2 (two) times daily.  . hydrochlorothiazide (HYDRODIURIL) 25 MG tablet Take 1 tablet (25 mg total) by mouth daily.  Marland Kitchen MAGNESIUM PO Take 500 mg by mouth  daily.   . meloxicam (MOBIC) 15 MG tablet Take one daily with food for 2 weeks, can take with tylenol, can not take with aleve, iburpofen, then as needed daily for pain  . Omega 3-6-9 Fatty Acids (OMEGA 3-6-9 COMPLEX) CAPS Take by mouth daily.  Marland Kitchen omeprazole (PRILOSEC) 40 MG capsule TAKE 1 CAPSULE BY MOUTH EVERY DAY  . prednisoLONE acetate (PRED FORTE) 1 % ophthalmic suspension 1 drop 4 (four) times daily.  . Red Yeast Rice Extract (RED YEAST RICE PO) Take 2,000 mg by mouth daily.  . ranitidine (ZANTAC) 300 MG tablet Take 1 tablet (300 mg total) by mouth at bedtime. (Patient not taking: Reported on 02/04/2018)   No current facility-administered medications on file prior to visit.      Allergies:  Allergies  Allergen Reactions  . Metronidazole Other (See Comments)    unknown  . Pneumovax 23 [Pneumococcal Vac Polyvalent]     Redness and severe swelling at injection site  . Ppd [Tuberculin Purified Protein Derivative] Other (See Comments)    + PPD 2013 with NEG CXR 05/2012     Medical History:  Past Medical History:  Diagnosis Date  . Anemia   . Arthritis    "right hand, back" (04/03/2016)  . Asthma   . Bilateral carpal tunnel syndrome   . Chronic lower back pain   . GERD (gastroesophageal reflux disease)   . Hyperlipidemia   . Hypertension   . Multiple allergies   . Pneumonia 03/2012  . Positive PPD    "they told me it wasn't positive; I was allergic to the test itself"  . Type II diabetes mellitus ([place])   . Vitamin deficiency   . Wears dentures    upper  . Wears glasses    Family history- Reviewed and unchanged Social history- Reviewed and unchanged   Review of Systems:  Review of Systems  Constitutional: Negative for malaise/fatigue and weight loss.  HENT: Negative for hearing loss and tinnitus.   Eyes: Negative for blurred vision and double vision.  Respiratory: Negative for cough, shortness of breath and wheezing.   Cardiovascular: Negative for chest pain,  palpitations, orthopnea, claudication and leg swelling.  Gastrointestinal: Negative for abdominal pain, blood in stool, constipation, diarrhea, heartburn, melena, nausea and vomiting.  Genitourinary: Negative.   Musculoskeletal: Positive for joint pain (R hip). Negative for myalgias.  Skin: Negative for rash.  Neurological: Negative for dizziness, tingling, sensory change, weakness and headaches.  Endo/Heme/Allergies: Negative for polydipsia.  Psychiatric/Behavioral: Negative.   All other systems reviewed and are negative.   Physical Exam: BP 120/68   Pulse 88   Temp (!) 97.3 F (36.3 C)   Resp 16   Ht 5\' 3"  (1.6 m)   Wt 200 lb (90.7 kg)   SpO2 99%   BMI 35.43 kg/m  Wt Readings from Last 3 Encounters:  05/31/18 200 lb (90.7 kg)  04/18/18 200 lb (90.7 kg)  03/17/18 200 lb (90.7 kg)   General Appearance: Well nourished, in no apparent distress. Eyes: PERRLA, EOMs, conjunctiva no swelling or erythema Sinuses: No Frontal/maxillary tenderness ENT/Mouth: Ext aud canals clear, TMs without erythema, bulging.  No erythema, swelling, or exudate on post pharynx.  Tonsils not swollen or erythematous. Hearing normal.  Neck: Supple, thyroid normal.  Respiratory: Respiratory effort normal, BS equal bilaterally without rales, rhonchi, wheezing or stridor.  Cardio: RRR with no MRGs. Brisk peripheral pulses without edema.  Abdomen: Soft, + BS.  Non tender, no guarding, rebound, hernias, masses. Lymphatics: Non tender without lymphadenopathy.  Musculoskeletal: Full ROM, 5/5 strength, Normal gait Skin: Warm, dry without rashes, lesions, ecchymosis.  Neuro: Cranial nerves intact. No cerebellar symptoms.  Psych: Awake and oriented X 3, normal affect, Insight and Judgment appropriate.    Izora Ribas, NP 10:55 AM [facility]

## 2018-05-31 ENCOUNTER — Encounter: Payer: Self-pay | Admitting: Adult Health

## 2018-05-31 ENCOUNTER — Ambulatory Visit (INDEPENDENT_AMBULATORY_CARE_PROVIDER_SITE_OTHER): Payer: Medicare HMO | Admitting: Adult Health

## 2018-05-31 VITALS — BP 120/68 | HR 88 | Temp 97.3°F | Resp 16 | Ht 63.0 in | Wt 200.0 lb

## 2018-05-31 DIAGNOSIS — E559 Vitamin D deficiency, unspecified: Secondary | ICD-10-CM | POA: Diagnosis not present

## 2018-05-31 DIAGNOSIS — M545 Low back pain, unspecified: Secondary | ICD-10-CM

## 2018-05-31 DIAGNOSIS — R7303 Prediabetes: Secondary | ICD-10-CM

## 2018-05-31 DIAGNOSIS — E669 Obesity, unspecified: Secondary | ICD-10-CM

## 2018-05-31 DIAGNOSIS — Z79899 Other long term (current) drug therapy: Secondary | ICD-10-CM | POA: Diagnosis not present

## 2018-05-31 DIAGNOSIS — E782 Mixed hyperlipidemia: Secondary | ICD-10-CM

## 2018-05-31 DIAGNOSIS — J301 Allergic rhinitis due to pollen: Secondary | ICD-10-CM | POA: Diagnosis not present

## 2018-05-31 DIAGNOSIS — J3089 Other allergic rhinitis: Secondary | ICD-10-CM | POA: Diagnosis not present

## 2018-05-31 DIAGNOSIS — I10 Essential (primary) hypertension: Secondary | ICD-10-CM | POA: Diagnosis not present

## 2018-05-31 MED ORDER — MELOXICAM 15 MG PO TABS: ORAL_TABLET | ORAL | 1 refills | Status: DC

## 2018-05-31 NOTE — Patient Instructions (Signed)
Aim for 7+ servings of fruits and vegetables daily  80+ fluid ounces of water or unsweet tea for healthy kidneys  Limit animal fats in diet for cholesterol and heart health - choose grass fed whenever available  Aim for low stress - take time to unwind and care for your mental health  Aim for 150 min of moderate intensity exercise weekly for heart health, and weights twice weekly for bone health  Aim for 7-9 hours of sleep daily   Drink 1/2 your body weight in fluid ounces of water daily; drink a tall glass of water 30 min before meals  Don't eat until you're stuffed- listen to your stomach and eat until you are 80% full   Try eating off of a salad plate; wait 10 min after finishing before going back for seconds  Start by eating the vegetables on your plate; aim for 50% of your meals to be fruits or vegetables  Then eat your protein - lean meats (grass fed if possible), fish, beans, nuts in moderation  Eat your carbs/starch last ONLY if you still are hungry. If you can, stop before finishing it all  Avoid sugar and flour - the closer it looks to it's original form in nature, typically the better it is for you  Splurge in moderation - "assign" days when you get to splurge and have the "bad stuff" - I like to follow a 80% - 20% plan- "good" choices 80 % of the time, "bad" choices in moderation 20% of the time  Simple equation is: Calories out > calories in = weight loss - even if you eat the bad stuff, if you limit portions, you will still lose weight      When it comes to diets, agreement about the perfect plan isn't easy to find, even among the experts. Experts at the [employer] developed an idea known as the Healthy Eating Plate. Just imagine a plate divided into logical, healthy portions.  The emphasis is on diet quality:  Load up on vegetables and fruits - one-half of your plate: Aim for color and variety, and remember that potatoes don't count.  Go  for whole grains - one-quarter of your plate: Whole wheat, barley, wheat berries, quinoa, oats, brown rice, and foods made with them. If you want pasta, go with whole wheat pasta.  Protein power - one-quarter of your plate: Fish, chicken, beans, and nuts are all healthy, versatile protein sources. Limit red meat.  The diet, however, does go beyond the plate, offering a few other suggestions.  Use healthy plant oils, such as olive, canola, soy, corn, sunflower and peanut. Check the labels, and avoid partially hydrogenated oil, which have unhealthy trans fats.  If you're thirsty, drink water. Coffee and tea are good in moderation, but skip sugary drinks and limit milk and dairy products to one or two daily servings.  The type of carbohydrate in the diet is more important than the amount. Some sources of carbohydrates, such as vegetables, fruits, whole grains, and beans-are healthier than others.  Finally, stay active.   

## 2018-06-01 ENCOUNTER — Other Ambulatory Visit: Payer: Self-pay | Admitting: Adult Health

## 2018-06-01 DIAGNOSIS — Z1231 Encounter for screening mammogram for malignant neoplasm of breast: Secondary | ICD-10-CM | POA: Diagnosis not present

## 2018-06-01 DIAGNOSIS — Z853 Personal history of malignant neoplasm of breast: Secondary | ICD-10-CM | POA: Diagnosis not present

## 2018-06-01 LAB — HEMOGLOBIN A1C
EAG (MMOL/L): 8.5 (calc)
HEMOGLOBIN A1C: 7 %{Hb} — AB (ref <5.7)
MEAN PLASMA GLUCOSE: 154 (calc)

## 2018-06-01 LAB — CBC WITH DIFFERENTIAL/PLATELET
BASOS ABS: 18 {cells}/uL (ref 0–200)
Basophils Relative: 0.3 %
EOS ABS: 104 {cells}/uL (ref 15–500)
Eosinophils Relative: 1.7 %
HCT: 38.1 % (ref 35.0–45.0)
HEMOGLOBIN: 12.3 g/dL (ref 11.7–15.5)
Lymphs Abs: 2861 cells/uL (ref 850–3900)
MCH: 27.3 pg (ref 27.0–33.0)
MCHC: 32.3 g/dL (ref 32.0–36.0)
MCV: 84.5 fL (ref 80.0–100.0)
MONOS PCT: 5.1 %
MPV: 11 fL (ref 7.5–12.5)
Neutro Abs: 2806 cells/uL (ref 1500–7800)
Neutrophils Relative %: 46 %
PLATELETS: 267 10*3/uL (ref 140–400)
RBC: 4.51 10*6/uL (ref 3.80–5.10)
RDW: 14 % (ref 11.0–15.0)
TOTAL LYMPHOCYTE: 46.9 %
WBC mixed population: 311 cells/uL (ref 200–950)
WBC: 6.1 10*3/uL (ref 3.8–10.8)

## 2018-06-01 LAB — LIPID PANEL
Cholesterol: 231 mg/dL — ABNORMAL HIGH (ref <200)
HDL: 92 mg/dL (ref >50)
LDL Cholesterol (Calc): 122 mg/dL (calc) — ABNORMAL HIGH
NON-HDL CHOLESTEROL (CALC): 139 mg/dL — AB (ref <130)
TRIGLYCERIDES: 75 mg/dL (ref <150)
Total CHOL/HDL Ratio: 2.5 (calc) (ref <5.0)

## 2018-06-01 LAB — HM MAMMOGRAPHY

## 2018-06-01 LAB — COMPLETE METABOLIC PANEL WITH GFR
AG RATIO: 1.5 (calc) (ref 1.0–2.5)
ALKALINE PHOSPHATASE (APISO): 102 U/L (ref 33–130)
ALT: 18 U/L (ref 6–29)
AST: 19 U/L (ref 10–35)
Albumin: 4.1 g/dL (ref 3.6–5.1)
BILIRUBIN TOTAL: 0.6 mg/dL (ref 0.2–1.2)
BUN: 13 mg/dL (ref 7–25)
CHLORIDE: 102 mmol/L (ref 98–110)
CO2: 31 mmol/L (ref 20–32)
Calcium: 9.4 mg/dL (ref 8.6–10.4)
Creat: 0.91 mg/dL (ref 0.50–0.99)
GFR, EST AFRICAN AMERICAN: 76 mL/min/{1.73_m2} (ref >=60)
GFR, Est Non African American: 66 mL/min/{1.73_m2} (ref >=60)
GLUCOSE: 113 mg/dL — AB (ref 65–99)
Globulin: 2.7 g/dL (calc) (ref 1.9–3.7)
POTASSIUM: 4.1 mmol/L (ref 3.5–5.3)
Sodium: 140 mmol/L (ref 135–146)
TOTAL PROTEIN: 6.8 g/dL (ref 6.1–8.1)

## 2018-06-01 LAB — TSH: TSH: 1.15 m[IU]/L (ref 0.40–4.50)

## 2018-06-01 LAB — MAGNESIUM: MAGNESIUM: 2.1 mg/dL (ref 1.5–2.5)

## 2018-06-01 LAB — VITAMIN D 25 HYDROXY (VIT D DEFICIENCY, FRACTURES): VIT D 25 HYDROXY: 27 ng/mL — AB (ref 30–100)

## 2018-06-01 MED ORDER — METFORMIN HCL ER 500 MG PO TB24: ORAL_TABLET | ORAL | 1 refills | Status: DC

## 2018-06-06 ENCOUNTER — Other Ambulatory Visit: Payer: Self-pay | Admitting: Physician Assistant

## 2018-06-08 ENCOUNTER — Ambulatory Visit (INDEPENDENT_AMBULATORY_CARE_PROVIDER_SITE_OTHER): Payer: Medicare HMO | Admitting: Orthopedic Surgery

## 2018-06-08 ENCOUNTER — Encounter (INDEPENDENT_AMBULATORY_CARE_PROVIDER_SITE_OTHER): Payer: Self-pay | Admitting: Orthopedic Surgery

## 2018-06-08 VITALS — Ht 63.0 in | Wt 200.0 lb

## 2018-06-08 DIAGNOSIS — M25551 Pain in right hip: Secondary | ICD-10-CM

## 2018-06-08 DIAGNOSIS — J301 Allergic rhinitis due to pollen: Secondary | ICD-10-CM | POA: Diagnosis not present

## 2018-06-08 DIAGNOSIS — J3089 Other allergic rhinitis: Secondary | ICD-10-CM | POA: Diagnosis not present

## 2018-06-08 DIAGNOSIS — IMO0001 Reserved for inherently not codable concepts without codable children: Secondary | ICD-10-CM

## 2018-06-08 DIAGNOSIS — M5416 Radiculopathy, lumbar region: Secondary | ICD-10-CM

## 2018-06-08 NOTE — Progress Notes (Signed)
Office Visit Note   Patient: Lauren Lopez           Date of Birth: 06/28/1951           MRN: [id number] Visit Date: 06/08/2018              Requested by: Unk Pinto, [place], [place] [id number] PCP: Unk Pinto, MD  Chief Complaint  Patient presents with  . Right Hip - Follow-up    S/p injection with FN      HPI: Patient is a 66 year old woman who initially had lower back pain radiating to the right hip.  Patient did go to physical therapy and therapy felt that most of her pain was coming from her hip.  Patient complains of pain with trying to cross her leg and pain with ambulation.  Patient has undergone a diagnostic and therapeutic injection in the right hip and this did not relieve any of her symptoms.  Patient also complains of muscle cramping which she feels may be related to her hip injection.  Patient is on fluid pills.  She states she does not take a potassium supplement.  Assessment & Plan: Visit Diagnoses:  1. Pain in right hip   2. Radicular pain of right lower back     Plan: Due to the fact that patient has failed therapy failed intra-articular injection of the right hip we will proceed with an MRI scan of the lumbar spine to identify the source of the radicular symptoms.  Recommended coconut water as a potassium supplement to see if this would help resolve her cramping issues in her legs.  Follow-Up Instructions: No follow-ups on file.   Ortho Exam  Patient is alert, oriented, no adenopathy, well-dressed, normal affect, normal respiratory effort. Examination patient has a slight abductor lurch.  With manipulation I cannot reproduce her hip pain with range of motion of the hip.  Patient has a negative straight leg raise she has no focal motor weakness in the right lower extremity.  Radiographs shows a grade 1 spondylolisthesis at L4-5 with joint space narrowing at L5-S1.  There is no pars defect.  Imaging: No  results found. No images are attached to the encounter.  Labs: Lab Results  Component Value Date   HGBA1C 7.0 (H) 05/31/2018   HGBA1C 6.5 (H) 02/04/2018   HGBA1C 6.3 (H) 11/10/2017   ESRSEDRATE 18 04/03/2016   CRP 1.2 (H) 04/03/2016   LABORGA NO GROWTH 02/04/2017     Lab Results  Component Value Date   ALBUMIN 4.3 07/08/2017   ALBUMIN 4.2 02/04/2017   ALBUMIN 4.3 10/15/2016    Body mass index is 35.43 kg/m.  Orders:  Orders Placed This Encounter  Procedures  . MR Lumbar Spine w/o contrast   No orders of the defined types were placed in this encounter.    Procedures: No procedures performed  Clinical Data: No additional findings.  ROS:  All other systems negative, except as noted in the HPI. Review of Systems  Objective: Vital Signs: Ht 5\' 3"  (1.6 m)   Wt 200 lb (90.7 kg)   BMI 35.43 kg/m   Specialty Comments:  No specialty comments available.  PMFS History: Patient Active Problem List   Diagnosis Date Noted  . Obesity (BMI 30.0-34.9) 05/30/2018  . Osteoporosis 06/01/2017  . Back pain 04/05/2016  . Medication management 01/29/2015  . T2_NIDDM 10/10/2014  . GERD (gastroesophageal reflux disease)   . Mixed hyperlipidemia   . Vitamin D  deficiency   . Asthma   . Essential hypertension 08/09/2009   Past Medical History:  Diagnosis Date  . Anemia   . Arthritis    "right hand, back" (04/03/2016)  . Asthma   . Bilateral carpal tunnel syndrome   . Chronic lower back pain   . GERD (gastroesophageal reflux disease)   . Hyperlipidemia   . Hypertension   . Multiple allergies   . Pneumonia 03/2012  . Positive PPD    "they told me it wasn't positive; I was allergic to the test itself"  . Type II diabetes mellitus ([place])   . Vitamin deficiency   . Wears dentures    upper  . Wears glasses     Family History  Problem Relation Age of Onset  . Hypertension Mother   . Diabetes Mother   . Stroke Mother   . Hypertension Father   . Heart disease  Brother   . Hyperlipidemia Brother   . Diabetes Brother     Past Surgical History:  Procedure Laterality Date  . ABDOMINAL HYSTERECTOMY    . BILATERAL SALPINGOOPHORECTOMY  11/28/2010   open laparoscopy with adhesiolysis also  . CARPAL TUNNEL RELEASE  07/15/2012   Procedure: CARPAL TUNNEL RELEASE;  Surgeon: Cammie Sickle., MD;  Location: [facility];  Service: Orthopedics;  Laterality: Left;  . CARPAL TUNNEL RELEASE Right 05/31/2014   Procedure: RIGHT CARPAL TUNNEL RELEASE;  Surgeon: Cammie Sickle, MD;  Location: [facility];  Service: Orthopedics;  Laterality: Right;  . COLONOSCOPY    . KNEE ARTHROSCOPY Bilateral 2005-2016   "right-left"  . SHOULDER ARTHROSCOPY WITH ROTATOR CUFF REPAIR AND SUBACROMIAL DECOMPRESSION Right 01/20/2013   Procedure: RIGHT ARTHROSCOPY SHOULDER DEBRIDEMENT LIMITED, ARTHROSCOPY SHOULDER DECOMPRESSION SUBACROMIAL PARTIAL ACROMIOPLASTY WITH CORACOACROMIAL RELEASE, ROTATOR CUFF REPAIR ;  Surgeon: Johnny Bridge, MD;  Location: [facility];  Service: Orthopedics;  Laterality: Right;  RIGHT SHOULDER SCOPE DEBRIDEMENT, ACRIOMIOPLASTY, ROTATOR CUFF REPAIR  . TUBAL LIGATION     Social History   Occupational History  . Not on file  Tobacco Use  . Smoking status: Never Smoker  . Smokeless tobacco: Never Used  Substance and Sexual Activity  . Alcohol use: No  . Drug use: No  . Sexual activity: Not Currently

## 2018-06-13 DIAGNOSIS — J301 Allergic rhinitis due to pollen: Secondary | ICD-10-CM | POA: Diagnosis not present

## 2018-06-13 DIAGNOSIS — J3089 Other allergic rhinitis: Secondary | ICD-10-CM | POA: Diagnosis not present

## 2018-06-16 DIAGNOSIS — J3089 Other allergic rhinitis: Secondary | ICD-10-CM | POA: Diagnosis not present

## 2018-06-16 DIAGNOSIS — J301 Allergic rhinitis due to pollen: Secondary | ICD-10-CM | POA: Diagnosis not present

## 2018-06-20 ENCOUNTER — Ambulatory Visit
Admission: RE | Admit: 2018-06-20 | Discharge: 2018-06-20 | Disposition: A | Discharge status: Home / Self Care | Payer: Medicare HMO | Source: Ambulatory Visit | Attending: Orthopedic Surgery | Admitting: Orthopedic Surgery

## 2018-06-20 DIAGNOSIS — IMO0001 Reserved for inherently not codable concepts without codable children: Secondary | ICD-10-CM

## 2018-06-20 DIAGNOSIS — M48061 Spinal stenosis, lumbar region without neurogenic claudication: Secondary | ICD-10-CM | POA: Diagnosis not present

## 2018-06-23 DIAGNOSIS — J301 Allergic rhinitis due to pollen: Secondary | ICD-10-CM | POA: Diagnosis not present

## 2018-06-23 DIAGNOSIS — J3089 Other allergic rhinitis: Secondary | ICD-10-CM | POA: Diagnosis not present

## 2018-06-29 ENCOUNTER — Telehealth (INDEPENDENT_AMBULATORY_CARE_PROVIDER_SITE_OTHER): Payer: Self-pay | Admitting: *Deleted

## 2018-06-29 DIAGNOSIS — J3089 Other allergic rhinitis: Secondary | ICD-10-CM | POA: Diagnosis not present

## 2018-06-29 DIAGNOSIS — J301 Allergic rhinitis due to pollen: Secondary | ICD-10-CM | POA: Diagnosis not present

## 2018-06-29 NOTE — Telephone Encounter (Signed)
Pt is scheduled for inj/mri review on 07/18/18 with driver & no BTs.  Authorization [id number] Auth Effective Date:06/29/2018 Auth End Date:09/27/2018

## 2018-06-30 ENCOUNTER — Encounter: Payer: Self-pay | Admitting: Internal Medicine

## 2018-07-05 ENCOUNTER — Ambulatory Visit (INDEPENDENT_AMBULATORY_CARE_PROVIDER_SITE_OTHER): Payer: Medicare HMO | Admitting: Orthopedic Surgery

## 2018-07-07 DIAGNOSIS — J301 Allergic rhinitis due to pollen: Secondary | ICD-10-CM | POA: Diagnosis not present

## 2018-07-07 DIAGNOSIS — J3089 Other allergic rhinitis: Secondary | ICD-10-CM | POA: Diagnosis not present

## 2018-07-13 DIAGNOSIS — J3089 Other allergic rhinitis: Secondary | ICD-10-CM | POA: Diagnosis not present

## 2018-07-13 DIAGNOSIS — J301 Allergic rhinitis due to pollen: Secondary | ICD-10-CM | POA: Diagnosis not present

## 2018-07-18 ENCOUNTER — Ambulatory Visit (INDEPENDENT_AMBULATORY_CARE_PROVIDER_SITE_OTHER): Payer: Self-pay

## 2018-07-18 ENCOUNTER — Encounter (INDEPENDENT_AMBULATORY_CARE_PROVIDER_SITE_OTHER): Payer: Self-pay | Admitting: Physical Medicine and Rehabilitation

## 2018-07-18 ENCOUNTER — Ambulatory Visit (INDEPENDENT_AMBULATORY_CARE_PROVIDER_SITE_OTHER): Payer: Medicare HMO | Admitting: Physical Medicine and Rehabilitation

## 2018-07-18 VITALS — BP 151/90 | HR 69 | Temp 97.6°F

## 2018-07-18 DIAGNOSIS — M5416 Radiculopathy, lumbar region: Secondary | ICD-10-CM | POA: Diagnosis not present

## 2018-07-18 MED ORDER — BETAMETHASONE SOD PHOS & ACET 6 (3-3) MG/ML IJ SUSP
12.0000 mg | Freq: Once | INTRAMUSCULAR | Status: AC
Start: 2018-07-18 — End: 2018-07-18
  Administered 2018-07-18: 12 mg

## 2018-07-18 NOTE — Progress Notes (Signed)
  Numeric Pain Rating Scale and Functional Assessment Average Pain 8   In the last MONTH (on 0-10 scale) has pain interfered with the following?  1. General activity like being  able to carry out your everyday physical activities such as walking, climbing stairs, carrying groceries, or moving a chair?  Rating(2)   +Driver, -BT, -Dye Allergies.

## 2018-07-18 NOTE — Patient Instructions (Signed)

## 2018-07-18 NOTE — Procedures (Signed)
Lumbosacral Transforaminal Epidural Steroid Injection - Sub-Pedicular Approach with Fluoroscopic Guidance  Patient: Lauren Lopez      Date of Birth: November 02, 1951 MRN: [id number] PCP: Unk Pinto, MD      Visit Date: 07/18/2018   Universal Protocol:    Date/Time: 07/18/2018  Consent Given By: the patient  Position: PRONE  Additional Comments: Vital signs were monitored before and after the procedure. Patient was prepped and draped in the usual sterile fashion. The correct patient, procedure, and site was verified.   Injection Procedure Details:  Procedure Site One Meds Administered:  Meds ordered this encounter  Medications  . betamethasone acetate-betamethasone sodium phosphate (CELESTONE) injection 12 mg    Laterality: Right  Location/Site:  L3-L4  Needle size: 22 G  Needle type: Spinal  Needle Placement: Transforaminal  Findings:    -Comments: Excellent flow of contrast along the nerve and into the epidural space.  Procedure Details: After squaring off the end-plates to get a true AP view, the C-arm was positioned so that an oblique view of the foramen as noted above was visualized. The target area is just inferior to the "nose of the scotty dog" or sub pedicular. The soft tissues overlying this structure were infiltrated with 2-3 ml. of 1% Lidocaine without Epinephrine.  The spinal needle was inserted toward the target using a "trajectory" view along the fluoroscope beam.  Under AP and lateral visualization, the needle was advanced so it did not puncture dura and was located close the 6 O'Clock position of the pedical in AP tracterory. Biplanar projections were used to confirm position. Aspiration was confirmed to be negative for CSF and/or blood. A 1-2 ml. volume of Isovue-250 was injected and flow of contrast was noted at each level. Radiographs were obtained for documentation purposes.   After attaining the desired flow of contrast documented above, a 0.5  to 1.0 ml test dose of 0.25% [place] was injected into each respective transforaminal space.  The patient was observed for 90 seconds post injection.  After no sensory deficits were reported, and normal lower extremity motor function was noted,   the above injectate was administered so that equal amounts of the injectate were placed at each foramen (level) into the transforaminal epidural space.   Additional Comments:  The patient tolerated the procedure well Dressing: Band-Aid    Post-procedure details: Patient was observed during the procedure. Post-procedure instructions were reviewed.  Patient left the clinic in stable condition.

## 2018-07-19 DIAGNOSIS — J3089 Other allergic rhinitis: Secondary | ICD-10-CM | POA: Diagnosis not present

## 2018-07-19 DIAGNOSIS — J301 Allergic rhinitis due to pollen: Secondary | ICD-10-CM | POA: Diagnosis not present

## 2018-07-26 DIAGNOSIS — J3089 Other allergic rhinitis: Secondary | ICD-10-CM | POA: Diagnosis not present

## 2018-07-26 DIAGNOSIS — J301 Allergic rhinitis due to pollen: Secondary | ICD-10-CM | POA: Diagnosis not present

## 2018-07-26 NOTE — Progress Notes (Signed)
Lauren Lopez - 67 y.o. female MRN [id number]  Date of birth: [birth year]  Office Visit Note: Visit Date: 07/18/2018 PCP: Unk Pinto, MD Referred by: Unk Pinto, MD  Subjective: Chief Complaint  Patient presents with  . Lower Back - Pain   HPI: Lauren Lopez is a 67 year old female followed by Dr. Meridee Score.  We completed diagnostic hip injection which did not help her much at all.  She went on to have physical therapy continued and they have felt like this was a hip issue but the diagnostic injection was negative.  Exam had not really pointed that much to the hip.  Dr. Sharol Given did obtain MRI of the lumbar spine and she is here for review of that today.  She has not seen the results.  She has interesting findings with left-sided disc herniation fragment at T12 that does impact the lateral recess.  This is clearly left-sided.  Her pain is right hip and thigh.  She has listhesis of L3 on L4 with facet arthropathy significant but no real central canal stenosis.  There is some foraminal narrowing.  Same at L4-5 but less so.  L5 is transitional with almost a fully sacralized L5 segment.  She essentially has 4 lumbar segments counting the ribs at T12.  I think the best approach is a right L3 transforaminal injection based purely on symptoms alone.  She did not get much relief with that would look at injection at T12.  She also continues see Dr. Sharol Given from orthopedic standpoint.   ROS Otherwise per HPI.  Assessment & Plan: Visit Diagnoses:  1. Lumbar radiculopathy     Plan: No additional findings.   Meds & Orders:  Meds ordered this encounter  Medications  . betamethasone acetate-betamethasone sodium phosphate (CELESTONE) injection 12 mg    Orders Placed This Encounter  Procedures  . XR C-ARM NO REPORT  . Epidural Steroid injection    Follow-up: Return if symptoms worsen or fail to improve.   Procedures: No procedures performed  Lumbosacral Transforaminal Epidural Steroid  Injection - Sub-Pedicular Approach with Fluoroscopic Guidance  Patient: Lauren Lopez      Date of Birth: 08-07-1951 MRN: [id number] PCP: Unk Pinto, MD      Visit Date: 07/18/2018   Universal Protocol:    Date/Time: 07/18/2018  Consent Given By: the patient  Position: PRONE  Additional Comments: Vital signs were monitored before and after the procedure. Patient was prepped and draped in the usual sterile fashion. The correct patient, procedure, and site was verified.   Injection Procedure Details:  Procedure Site One Meds Administered:  Meds ordered this encounter  Medications  . betamethasone acetate-betamethasone sodium phosphate (CELESTONE) injection 12 mg    Laterality: Right  Location/Site:  L3-L4  Needle size: 22 G  Needle type: Spinal  Needle Placement: Transforaminal  Findings:    -Comments: Excellent flow of contrast along the nerve and into the epidural space.  Procedure Details: After squaring off the end-plates to get a true AP view, the C-arm was positioned so that an oblique view of the foramen as noted above was visualized. The target area is just inferior to the "nose of the scotty dog" or sub pedicular. The soft tissues overlying this structure were infiltrated with 2-3 ml. of 1% Lidocaine without Epinephrine.  The spinal needle was inserted toward the target using a "trajectory" view along the fluoroscope beam.  Under AP and lateral visualization, the needle was advanced so it did not puncture dura and  was located close the 6 O'Clock position of the pedical in AP tracterory. Biplanar projections were used to confirm position. Aspiration was confirmed to be negative for CSF and/or blood. A 1-2 ml. volume of Isovue-250 was injected and flow of contrast was noted at each level. Radiographs were obtained for documentation purposes.   After attaining the desired flow of contrast documented above, a 0.5 to 1.0 ml test dose of 0.25% Marcaine was  injected into each respective transforaminal space.  The patient was observed for 90 seconds post injection.  After no sensory deficits were reported, and normal lower extremity motor function was noted,   the above injectate was administered so that equal amounts of the injectate were placed at each foramen (level) into the transforaminal epidural space.   Additional Comments:  The patient tolerated the procedure well Dressing: Band-Aid    Post-procedure details: Patient was observed during the procedure. Post-procedure instructions were reviewed.  Patient left the clinic in stable condition.     Clinical History: MRI LUMBAR SPINE WITHOUT CONTRAST  TECHNIQUE: Multiplanar, multisequence MR imaging of the lumbar spine was performed. No intravenous contrast was administered.  COMPARISON:  Lumbar radiographs 02/24/2018 and earlier. CT Abdomen and Pelvis 10/20/2010. chest CTA 04/03/2016.  FINDINGS: Segmentation: 12 pairs of ribs including the lowest which are hypoplastic demonstrated on the 2017 CTA. Subsequently there are 4 lumbar type vertebral bodies. Therefore, the L5 level is fully sacralized with a vestigial L5-S1 disc space. Correlation with radiographs is recommended prior to any operative intervention.  Alignment: Chronic grade 1 anterolisthesis of L3 on L4 measuring 3-4 millimeters appears increased since 2011. Stable vertebral height and alignment elsewhere with mild straightening of lumbar lordosis.  Vertebrae: No marrow edema or evidence of acute osseous abnormality. Visualized bone marrow signal is within normal limits. Intact visible sacrum and SI joints.  Conus medullaris and cauda equina: Conus extends to the L1 level. No lower spinal cord or conus signal abnormality.  Paraspinal and other soft tissues: Stable since 2011 and negative visible abdominal viscera. Negative visualized posterior paraspinal soft tissues.  Disc levels:  T10-T11:  Negative.  T11-T12: Negative.  T12-L1: Mild disc bulging with cephalad extruded disc fragment occupying the left ventral epidural space posterior to the T12 vertebral body (series 8, image 8, series 11, image 4. The extruded disc fragment encompasses 4 x 10 x 10 millimeters (AP by transverse by CC). This is in proximity to the descending left T12 nerve roots in the lateral recess. No significant spinal or foraminal stenosis.  L1-L2: Minimal far lateral disc bulging and facet hypertrophy. No stenosis.  L2-L3: Mild far lateral disc bulging and endplate spurring. Moderate facet hypertrophy. No significant stenosis.  L3-L4: Grade 1 anterolisthesis. Disc desiccation and mild disc space loss with circumferential but mostly foraminal and far lateral disc bulging. Moderate to severe facet and ligament flavum hypertrophy. Mild spinal stenosis with mild bilateral lateral recess stenosis (L4 nerve levels), and mild to moderate bilateral L3 neural foraminal stenosis.  L4-L5: Vacuum disc. Circumferential disc bulge with broad-based foraminal involvement. Moderate facet and ligament flavum hypertrophy. No spinal or lateral recess stenosis. Mild to moderate bilateral L4 foraminal stenosis appears greater on the right.  L5-S1:  Sacralized and negative.  IMPRESSION: 1. 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 Correlation with radiographs is recommended prior to any operative intervention. 2. Grade 1 spondylolisthesis at L3-L4 is chronic but progressed since 2011. There is multifactorial mild spinal stenosis with mild lateral recess and up to moderate neural  foraminal stenosis. Query right L3 and/or L4 radiculitis. 3. Up to moderate multifactorial neural foraminal stenosis also at L4-L5; the exiting L4 nerve levels. 4. There is a small left-side extruded disc fragment posterior to the T12 vertebral body. No significant spinal stenosis, this could be a source  for left T12 radiculitis. 5. Mild for age lumbar spine degeneration elsewhere.   Electronically Signed   By: Genevie Ann M.D.   On: 06/21/2018 09:11   She reports that she has never smoked. She has never used smokeless tobacco.  Recent Labs    11/10/17 1706 02/04/18 1124 05/31/18 1113  HGBA1C 6.3* 6.5* 7.0*    Objective:  VS:  HT:    WT:   BMI:     BP:(!) 151/90  HR:69bpm  TEMP:97.6 F (36.4 C)(Oral)  RESP:98 % Physical Exam  Ortho Exam Imaging: No results found.  Past Medical/Family/Surgical/Social History: Medications & Allergies reviewed per EMR, new medications updated. Patient Active Problem List   Diagnosis Date Noted  . Obesity (BMI 30.0-34.9) 05/30/2018  . Osteoporosis 06/01/2017  . Back pain 04/05/2016  . Medication management 01/29/2015  . T2_NIDDM 10/10/2014  . GERD (gastroesophageal reflux disease)   . Mixed hyperlipidemia   . Vitamin D deficiency   . Asthma   . Essential hypertension 08/09/2009   Past Medical History:  Diagnosis Date  . Anemia   . Arthritis    "right hand, back" (04/03/2016)  . Asthma   . Bilateral carpal tunnel syndrome   . Chronic lower back pain   . GERD (gastroesophageal reflux disease)   . Hyperlipidemia   . Hypertension   . Multiple allergies   . Pneumonia 03/2012  . Positive PPD    "they told me it wasn't positive; I was allergic to the test itself"  . Type II diabetes mellitus ([place])   . Vitamin deficiency   . Wears dentures    upper  . Wears glasses    Family History  Problem Relation Age of Onset  . Hypertension Mother   . Diabetes Mother   . Stroke Mother   . Hypertension Father   . Heart disease Brother   . Hyperlipidemia Brother   . Diabetes Brother    Past Surgical History:  Procedure Laterality Date  . ABDOMINAL HYSTERECTOMY    . BILATERAL SALPINGOOPHORECTOMY  11/28/2010   open laparoscopy with adhesiolysis also  . CARPAL TUNNEL RELEASE  07/15/2012   Procedure: CARPAL TUNNEL RELEASE;  Surgeon:  Cammie Sickle., MD;  Location: [facility];  Service: Orthopedics;  Laterality: Left;  . CARPAL TUNNEL RELEASE Right 05/31/2014   Procedure: RIGHT CARPAL TUNNEL RELEASE;  Surgeon: Cammie Sickle, MD;  Location: [facility];  Service: Orthopedics;  Laterality: Right;  . COLONOSCOPY    . KNEE ARTHROSCOPY Bilateral 2005-2016   "right-left"  . SHOULDER ARTHROSCOPY WITH ROTATOR CUFF REPAIR AND SUBACROMIAL DECOMPRESSION Right 01/20/2013   Procedure: RIGHT ARTHROSCOPY SHOULDER DEBRIDEMENT LIMITED, ARTHROSCOPY SHOULDER DECOMPRESSION SUBACROMIAL PARTIAL ACROMIOPLASTY WITH CORACOACROMIAL RELEASE, ROTATOR CUFF REPAIR ;  Surgeon: Johnny Bridge, MD;  Location: [facility];  Service: Orthopedics;  Laterality: Right;  RIGHT SHOULDER SCOPE DEBRIDEMENT, ACRIOMIOPLASTY, ROTATOR CUFF REPAIR  . TUBAL LIGATION     Social History   Occupational History  . Not on file  Tobacco Use  . Smoking status: Never Smoker  . Smokeless tobacco: Never Used  Substance and Sexual Activity  . Alcohol use: No  . Drug use: No  . Sexual activity:  Not Currently

## 2018-08-02 DIAGNOSIS — J3089 Other allergic rhinitis: Secondary | ICD-10-CM | POA: Diagnosis not present

## 2018-08-02 DIAGNOSIS — J301 Allergic rhinitis due to pollen: Secondary | ICD-10-CM | POA: Diagnosis not present

## 2018-08-09 DIAGNOSIS — J3089 Other allergic rhinitis: Secondary | ICD-10-CM | POA: Diagnosis not present

## 2018-08-09 DIAGNOSIS — J301 Allergic rhinitis due to pollen: Secondary | ICD-10-CM | POA: Diagnosis not present

## 2018-08-12 DIAGNOSIS — Z791 Long term (current) use of non-steroidal anti-inflammatories (NSAID): Secondary | ICD-10-CM | POA: Diagnosis not present

## 2018-08-12 DIAGNOSIS — M810 Age-related osteoporosis without current pathological fracture: Secondary | ICD-10-CM | POA: Diagnosis not present

## 2018-08-12 DIAGNOSIS — I10 Essential (primary) hypertension: Secondary | ICD-10-CM | POA: Diagnosis not present

## 2018-08-12 DIAGNOSIS — E119 Type 2 diabetes mellitus without complications: Secondary | ICD-10-CM | POA: Diagnosis not present

## 2018-08-12 DIAGNOSIS — J45909 Unspecified asthma, uncomplicated: Secondary | ICD-10-CM | POA: Diagnosis not present

## 2018-08-12 DIAGNOSIS — Z6836 Body mass index (BMI) 36.0-36.9, adult: Secondary | ICD-10-CM | POA: Diagnosis not present

## 2018-08-12 DIAGNOSIS — G8929 Other chronic pain: Secondary | ICD-10-CM | POA: Diagnosis not present

## 2018-08-12 DIAGNOSIS — Z7951 Long term (current) use of inhaled steroids: Secondary | ICD-10-CM | POA: Diagnosis not present

## 2018-08-12 DIAGNOSIS — K219 Gastro-esophageal reflux disease without esophagitis: Secondary | ICD-10-CM | POA: Diagnosis not present

## 2018-08-16 DIAGNOSIS — J301 Allergic rhinitis due to pollen: Secondary | ICD-10-CM | POA: Diagnosis not present

## 2018-08-16 DIAGNOSIS — J3089 Other allergic rhinitis: Secondary | ICD-10-CM | POA: Diagnosis not present

## 2018-08-20 ENCOUNTER — Other Ambulatory Visit: Payer: Self-pay | Admitting: Adult Health

## 2018-08-20 DIAGNOSIS — M545 Low back pain, unspecified: Secondary | ICD-10-CM

## 2018-08-23 DIAGNOSIS — J3089 Other allergic rhinitis: Secondary | ICD-10-CM | POA: Diagnosis not present

## 2018-08-23 DIAGNOSIS — J301 Allergic rhinitis due to pollen: Secondary | ICD-10-CM | POA: Diagnosis not present

## 2018-08-25 DIAGNOSIS — R69 Illness, unspecified: Secondary | ICD-10-CM | POA: Diagnosis not present

## 2018-08-29 DIAGNOSIS — J3089 Other allergic rhinitis: Secondary | ICD-10-CM | POA: Diagnosis not present

## 2018-08-29 DIAGNOSIS — J301 Allergic rhinitis due to pollen: Secondary | ICD-10-CM | POA: Diagnosis not present

## 2018-09-01 ENCOUNTER — Ambulatory Visit: Payer: Self-pay | Admitting: Internal Medicine

## 2018-09-01 DIAGNOSIS — H401131 Primary open-angle glaucoma, bilateral, mild stage: Secondary | ICD-10-CM | POA: Diagnosis not present

## 2018-09-01 DIAGNOSIS — E119 Type 2 diabetes mellitus without complications: Secondary | ICD-10-CM | POA: Diagnosis not present

## 2018-09-07 DIAGNOSIS — J301 Allergic rhinitis due to pollen: Secondary | ICD-10-CM | POA: Diagnosis not present

## 2018-09-07 DIAGNOSIS — J3089 Other allergic rhinitis: Secondary | ICD-10-CM | POA: Diagnosis not present

## 2018-09-08 DIAGNOSIS — D124 Benign neoplasm of descending colon: Secondary | ICD-10-CM | POA: Diagnosis not present

## 2018-09-08 DIAGNOSIS — D122 Benign neoplasm of ascending colon: Secondary | ICD-10-CM | POA: Diagnosis not present

## 2018-09-08 DIAGNOSIS — D123 Benign neoplasm of transverse colon: Secondary | ICD-10-CM | POA: Diagnosis not present

## 2018-09-08 DIAGNOSIS — Z86010 Personal history of colonic polyps: Secondary | ICD-10-CM | POA: Diagnosis not present

## 2018-09-08 DIAGNOSIS — K635 Polyp of colon: Secondary | ICD-10-CM | POA: Diagnosis not present

## 2018-09-08 DIAGNOSIS — Z1211 Encounter for screening for malignant neoplasm of colon: Secondary | ICD-10-CM | POA: Diagnosis not present

## 2018-09-08 DIAGNOSIS — D125 Benign neoplasm of sigmoid colon: Secondary | ICD-10-CM | POA: Diagnosis not present

## 2018-09-08 DIAGNOSIS — Z800 Family history of malignant neoplasm of digestive organs: Secondary | ICD-10-CM | POA: Diagnosis not present

## 2018-09-08 DIAGNOSIS — K5730 Diverticulosis of large intestine without perforation or abscess without bleeding: Secondary | ICD-10-CM | POA: Diagnosis not present

## 2018-09-08 LAB — HM COLONOSCOPY

## 2018-09-09 DIAGNOSIS — D124 Benign neoplasm of descending colon: Secondary | ICD-10-CM | POA: Diagnosis not present

## 2018-09-09 DIAGNOSIS — D122 Benign neoplasm of ascending colon: Secondary | ICD-10-CM | POA: Diagnosis not present

## 2018-09-11 ENCOUNTER — Other Ambulatory Visit: Payer: Self-pay | Admitting: Internal Medicine

## 2018-09-13 DIAGNOSIS — J301 Allergic rhinitis due to pollen: Secondary | ICD-10-CM | POA: Diagnosis not present

## 2018-09-13 DIAGNOSIS — J3089 Other allergic rhinitis: Secondary | ICD-10-CM | POA: Diagnosis not present

## 2018-09-14 NOTE — Progress Notes (Signed)
This very nice 67 y.o. DBF presents for 3 month follow up with HTN, HLD, Pre-Diabetes and Vitamin D Deficiency.  She also has GERD requiring dual therapy for control of her sx's.      Patient is treated for HTN & BP has been controlled at home. Today's BP is at goal - 126/72. Patient has had no complaints of any cardiac type chest pain, palpitations, dyspnea / orthopnea / PND, dizziness, claudication, or dependent edema.     Hyperlipidemia is controlled with diet & meds. Patient denies myalgias or other med SE's. Last Lipids were near goal: Lab Results  Component Value Date   CHOL 205 (H) 09/15/2018   HDL 71 09/15/2018   LDLCALC 102 (H) 09/15/2018   TRIG 197 (H) 09/15/2018   CHOLHDL 2.9 09/15/2018      Also, the patient has T2_NIDDM & was started on Metformin in July 2019  and has had no symptoms of reactive hypoglycemia, diabetic polys, paresthesias or visual blurring. 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  Last A1c was not at goal: Lab Results  Component Value Date   HGBA1C 6.5 (H) 09/15/2018      Further, the patient also has history of Vitamin D Deficiency and does not supplement  vitamin D. Last vitamin D was very low:  Lab Results  Component Value Date   VD25OH 24 (L) 09/15/2018   Current Outpatient Medications on File Prior to Visit  Medication Sig  . alendronate (FOSAMAX) 70 MG tablet TAKE 1 TABLET EVERY 7 DAYS WITH A FULL GLASS OF WATER ON AN EMPTY STOMACH  . calcium carbonate (CALCIUM 600) 600 MG TABS tablet Take 600 mg by mouth daily.  . Cholecalciferol (VITAMIN D3) 5000 UNITS TABS Take 5,000 Units by mouth daily.  . cyanocobalamin 100 MCG tablet Take 100 mcg by mouth daily.  . cyclobenzaprine (FLEXERIL) 10 MG tablet Take 1 tablet (10 mg total) by mouth at bedtime as needed for muscle spasms.  . fexofenadine (ALLEGRA) 180 MG tablet Take 1 tablet (180 mg total) by mouth daily.  . fluticasone-salmeterol (ADVAIR HFA) 115-21 MCG/ACT inhaler Inhale 2 puffs into the lungs 2 (two)  times daily.  . hydrochlorothiazide (HYDRODIURIL) 25 MG tablet Take 1 tablet (25 mg total) by mouth daily.  Marland Kitchen MAGNESIUM PO Take 500 mg by mouth daily.   . meloxicam (MOBIC) 15 MG tablet Take 1/2 to 1 tablet daily with Food for Pain & Inflammation and limit to 5 days/week to avoid Kidney Damage  . metFORMIN (GLUCOPHAGE-XR) 500 MG 24 hr tablet Take 1 tab daily with largest meal of the day.  Marland Kitchen omeprazole (PRILOSEC) 40 MG capsule TAKE 1 CAPSULE BY MOUTH EVERY DAY  . prednisoLONE acetate (PRED FORTE) 1 % ophthalmic suspension 1 drop 4 (four) times daily.  . Red Yeast Rice Extract (RED YEAST RICE PO) Take 2,000 mg by mouth daily.  . ranitidine (ZANTAC) 300 MG tablet Take 1 tablet (300 mg total) by mouth at bedtime. (Patient not taking: Reported on 02/04/2018)   No current facility-administered medications on file prior to visit.    Allergies  Allergen Reactions  . Metronidazole Other (See Comments)    unknown  . Pneumovax 23 [Pneumococcal Vac Polyvalent]     Redness and severe swelling at injection site  . Ppd [Tuberculin Purified Protein Derivative] Other (See Comments)    + PPD 2013 with NEG CXR 05/2012   PMHx:   Past Medical History:  Diagnosis Date  . Anemia   . Arthritis    "  right hand, back" (04/03/2016)  . Asthma   . Bilateral carpal tunnel syndrome   . Chronic lower back pain   . GERD (gastroesophageal reflux disease)   . Hyperlipidemia   . Hypertension   . Multiple allergies   . Pneumonia 03/2012  . Positive PPD    "they told me it wasn't positive; I was allergic to the test itself"  . Type II diabetes mellitus ([place])   . Vitamin deficiency   . Wears dentures    upper  . Wears glasses    Immunization History  Administered Date(s) Administered  . Influenza, High Dose Seasonal PF 11/10/2017, 09/15/2018  . Influenza, Seasonal, Injecte, Preservative Fre 10/17/2015  . Influenza,inj,quad, With Preservative 10/15/2016  . Pneumococcal Polysaccharide-23 02/04/2017  .  Pneumococcal-Unspecified 06/27/2013  . Tdap 04/23/2012  . Zoster 07/31/2015   Past Surgical History:  Procedure Laterality Date  . ABDOMINAL HYSTERECTOMY    . BILATERAL SALPINGOOPHORECTOMY  11/28/2010   open laparoscopy with adhesiolysis also  . CARPAL TUNNEL RELEASE  07/15/2012   Procedure: CARPAL TUNNEL RELEASE;  Surgeon: Cammie Sickle., MD;  Location: [facility];  Service: Orthopedics;  Laterality: Left;  . CARPAL TUNNEL RELEASE Right 05/31/2014   Procedure: RIGHT CARPAL TUNNEL RELEASE;  Surgeon: Cammie Sickle, MD;  Location: [facility];  Service: Orthopedics;  Laterality: Right;  . COLONOSCOPY    . KNEE ARTHROSCOPY Bilateral 2005-2016   "right-left"  . SHOULDER ARTHROSCOPY WITH ROTATOR CUFF REPAIR AND SUBACROMIAL DECOMPRESSION Right 01/20/2013   Procedure: RIGHT ARTHROSCOPY SHOULDER DEBRIDEMENT LIMITED, ARTHROSCOPY SHOULDER DECOMPRESSION SUBACROMIAL PARTIAL ACROMIOPLASTY WITH CORACOACROMIAL RELEASE, ROTATOR CUFF REPAIR ;  Surgeon: Johnny Bridge, MD;  Location: [facility];  Service: Orthopedics;  Laterality: Right;  RIGHT SHOULDER SCOPE DEBRIDEMENT, ACRIOMIOPLASTY, ROTATOR CUFF REPAIR  . TUBAL LIGATION     FHx:    Reviewed / unchanged  SHx:    Reviewed / unchanged   Systems Review:  Constitutional: Denies fever, chills, wt changes, headaches, insomnia, fatigue, night sweats, change in appetite. Eyes: Denies redness, blurred vision, diplopia, discharge, itchy, watery eyes.  ENT: Denies discharge, congestion, post nasal drip, epistaxis, sore throat, earache, hearing loss, dental pain, tinnitus, vertigo, sinus pain, snoring.  CV: Denies chest pain, palpitations, irregular heartbeat, syncope, dyspnea, diaphoresis, orthopnea, PND, claudication or edema. Respiratory: denies cough, dyspnea, DOE, pleurisy, hoarseness, laryngitis, wheezing.  Gastrointestinal: Denies dysphagia, odynophagia, heartburn, reflux, water brash, abdominal pain or  cramps, nausea, vomiting, bloating, diarrhea, constipation, hematemesis, melena, hematochezia  or hemorrhoids. Genitourinary: Denies dysuria, frequency, urgency, nocturia, hesitancy, discharge, hematuria or flank pain. Musculoskeletal: Denies arthralgias, myalgias, stiffness, jt. swelling, pain, limping or strain/sprain.  Skin: Denies pruritus, rash, hives, warts, acne, eczema or change in skin lesion(s). Neuro: No weakness, tremor, incoordination, spasms, paresthesia or pain. Psychiatric: Denies confusion, memory loss or sensory loss. Endo: Denies change in weight, skin or hair change.  Heme/Lymph: No excessive bleeding, bruising or enlarged lymph nodes.  Physical Exam  BP 126/72   Pulse 64   Temp 97.9 F (36.6 C)   Resp 16   Ht 5\' 3"  (1.6 m)   Wt 200 lb 12.8 oz (91.1 kg)   BMI 35.57 kg/m   Appears  well nourished, well groomed  and in no distress.  Eyes: PERRLA, EOMs, conjunctiva no swelling or erythema. Sinuses: No frontal/maxillary tenderness ENT/Mouth: EAC's clear, TM's nl w/o erythema, bulging. Nares clear w/o erythema, swelling, exudates. Oropharynx clear without erythema or exudates. Oral hygiene is good. Tongue normal, non  obstructing. Hearing intact.  Neck: Supple. Thyroid not palpable. Car 2+/2+ without bruits, nodes or JVD. Chest: Respirations nl with BS clear & equal w/o rales, rhonchi, wheezing or stridor.  Cor: Heart sounds normal w/ regular rate and rhythm without sig. murmurs, gallops, clicks or rubs. Peripheral pulses normal and equal  without edema.  Abdomen: Soft & bowel sounds normal. Non-tender w/o guarding, rebound, hernias, masses or organomegaly.  Lymphatics: Unremarkable.  Musculoskeletal: Full ROM all peripheral extremities, joint stability, 5/5 strength and normal gait.  Skin: Warm, dry without exposed rashes, lesions or ecchymosis apparent.  Neuro: Cranial nerves intact, reflexes equal bilaterally. Sensory-motor testing grossly intact. Tendon reflexes  grossly intact.  Pysch: Alert & oriented x 3.  Insight and judgement nl & appropriate. No ideations.  Assessment and Plan:  1. Essential hypertension  - Continue medication, monitor blood pressure at home.  - Continue DASH diet.  Reminder to go to the ER if any CP,  SOB, nausea, dizziness, severe HA, changes vision/speech.  - CBC with Differential/Platelet - COMPLETE METABOLIC PANEL WITH GFR - Magnesium - TSH  2. Hyperlipidemia, mixed  - Continue diet/meds, exercise,& lifestyle modifications.  - Continue monitor periodic cholesterol/liver & renal functions   - Lipid panel - TSH  3. Type 2 diabetes mellitus with stage 2 chronic kidney disease, without long-term current use of insulin ([name])  - Continue diet, exercise, lifestyle modifications.  - Monitor appropriate labs.  - Hemoglobin A1c - Insulin, random  4. Vitamin D deficiency  - Continue supplementation.   - VITAMIN D 25 Hydroxyl  5. Gastroesophageal reflux disease  - CBC with Differential/Platelet  6. Medication management  - CBC with Differential/Platelet - COMPLETE METABOLIC PANEL WITH GFR - Magnesium - Lipid panel - TSH - Hemoglobin A1c - Insulin, random - VITAMIN D 25 Hydroxyl        Discussed  regular exercise, BP monitoring, weight control to achieve/maintain BMI less than 25 and discussed med and SE's. Recommended labs to assess and monitor clinical status with further disposition pending results of labs. Over 30 minutes of exam, counseling, chart review was performed.

## 2018-09-15 ENCOUNTER — Encounter: Payer: Self-pay | Admitting: Internal Medicine

## 2018-09-15 ENCOUNTER — Encounter: Payer: Self-pay | Admitting: *Deleted

## 2018-09-15 ENCOUNTER — Ambulatory Visit (INDEPENDENT_AMBULATORY_CARE_PROVIDER_SITE_OTHER): Payer: Medicare HMO | Admitting: Internal Medicine

## 2018-09-15 VITALS — BP 126/72 | HR 64 | Temp 97.9°F | Resp 16 | Ht 63.0 in | Wt 200.8 lb

## 2018-09-15 DIAGNOSIS — K219 Gastro-esophageal reflux disease without esophagitis: Secondary | ICD-10-CM | POA: Diagnosis not present

## 2018-09-15 DIAGNOSIS — Z79899 Other long term (current) drug therapy: Secondary | ICD-10-CM

## 2018-09-15 DIAGNOSIS — I10 Essential (primary) hypertension: Secondary | ICD-10-CM | POA: Diagnosis not present

## 2018-09-15 DIAGNOSIS — N182 Chronic kidney disease, stage 2 (mild): Secondary | ICD-10-CM

## 2018-09-15 DIAGNOSIS — E1122 Type 2 diabetes mellitus with diabetic chronic kidney disease: Secondary | ICD-10-CM | POA: Diagnosis not present

## 2018-09-15 DIAGNOSIS — E782 Mixed hyperlipidemia: Secondary | ICD-10-CM | POA: Diagnosis not present

## 2018-09-15 DIAGNOSIS — E559 Vitamin D deficiency, unspecified: Secondary | ICD-10-CM | POA: Diagnosis not present

## 2018-09-15 DIAGNOSIS — Z23 Encounter for immunization: Secondary | ICD-10-CM | POA: Diagnosis not present

## 2018-09-15 NOTE — Patient Instructions (Signed)

## 2018-09-16 LAB — CBC WITH DIFFERENTIAL/PLATELET
BASOS ABS: 19 {cells}/uL (ref 0–200)
Basophils Relative: 0.3 %
EOS PCT: 2.1 %
Eosinophils Absolute: 132 cells/uL (ref 15–500)
HCT: 38 % (ref 35.0–45.0)
Hemoglobin: 12.2 g/dL (ref 11.7–15.5)
Lymphs Abs: 3074 cells/uL (ref 850–3900)
MCH: 27.4 pg (ref 27.0–33.0)
MCHC: 32.1 g/dL (ref 32.0–36.0)
MCV: 85.2 fL (ref 80.0–100.0)
MPV: 11.9 fL (ref 7.5–12.5)
Monocytes Relative: 5.1 %
NEUTROS PCT: 43.7 %
Neutro Abs: 2753 cells/uL (ref 1500–7800)
PLATELETS: 279 10*3/uL (ref 140–400)
RBC: 4.46 10*6/uL (ref 3.80–5.10)
RDW: 13.3 % (ref 11.0–15.0)
TOTAL LYMPHOCYTE: 48.8 %
WBC mixed population: 321 cells/uL (ref 200–950)
WBC: 6.3 10*3/uL (ref 3.8–10.8)

## 2018-09-16 LAB — LIPID PANEL
Cholesterol: 205 mg/dL — ABNORMAL HIGH (ref <200)
HDL: 71 mg/dL (ref >50)
LDL Cholesterol (Calc): 102 mg/dL (calc) — ABNORMAL HIGH
Non-HDL Cholesterol (Calc): 134 mg/dL (calc) — ABNORMAL HIGH (ref <130)
Total CHOL/HDL Ratio: 2.9 (calc) (ref <5.0)
Triglycerides: 197 mg/dL — ABNORMAL HIGH (ref <150)

## 2018-09-16 LAB — COMPLETE METABOLIC PANEL WITH GFR
AG Ratio: 1.6 (calc) (ref 1.0–2.5)
ALBUMIN MSPROF: 4.2 g/dL (ref 3.6–5.1)
ALKALINE PHOSPHATASE (APISO): 99 U/L (ref 33–130)
ALT: 17 U/L (ref 6–29)
AST: 18 U/L (ref 10–35)
BILIRUBIN TOTAL: 0.4 mg/dL (ref 0.2–1.2)
BUN: 12 mg/dL (ref 7–25)
CHLORIDE: 103 mmol/L (ref 98–110)
CO2: 31 mmol/L (ref 20–32)
Calcium: 9.8 mg/dL (ref 8.6–10.4)
Creat: 0.87 mg/dL (ref 0.50–0.99)
GFR, Est African American: 80 mL/min/{1.73_m2} (ref >=60)
GFR, Est Non African American: 69 mL/min/{1.73_m2} (ref >=60)
GLUCOSE: 90 mg/dL (ref 65–99)
Globulin: 2.7 g/dL (calc) (ref 1.9–3.7)
Potassium: 3.8 mmol/L (ref 3.5–5.3)
SODIUM: 141 mmol/L (ref 135–146)
Total Protein: 6.9 g/dL (ref 6.1–8.1)

## 2018-09-16 LAB — TSH: TSH: 1.13 mIU/L (ref 0.40–4.50)

## 2018-09-16 LAB — MAGNESIUM: Magnesium: 1.6 mg/dL (ref 1.5–2.5)

## 2018-09-16 LAB — INSULIN, RANDOM: INSULIN: 22.4 u[IU]/mL — AB (ref 2.0–19.6)

## 2018-09-16 LAB — HEMOGLOBIN A1C
HEMOGLOBIN A1C: 6.5 %{Hb} — AB (ref <5.7)
MEAN PLASMA GLUCOSE: 140 (calc)
eAG (mmol/L): 7.7 (calc)

## 2018-09-16 LAB — VITAMIN D 25 HYDROXY (VIT D DEFICIENCY, FRACTURES): Vit D, 25-Hydroxy: 24 ng/mL — ABNORMAL LOW (ref 30–100)

## 2018-09-19 ENCOUNTER — Encounter: Payer: Self-pay | Admitting: *Deleted

## 2018-09-20 DIAGNOSIS — J3089 Other allergic rhinitis: Secondary | ICD-10-CM | POA: Diagnosis not present

## 2018-09-20 DIAGNOSIS — J301 Allergic rhinitis due to pollen: Secondary | ICD-10-CM | POA: Diagnosis not present

## 2018-09-29 DIAGNOSIS — J301 Allergic rhinitis due to pollen: Secondary | ICD-10-CM | POA: Diagnosis not present

## 2018-09-29 DIAGNOSIS — J3089 Other allergic rhinitis: Secondary | ICD-10-CM | POA: Diagnosis not present

## 2018-10-05 DIAGNOSIS — J3089 Other allergic rhinitis: Secondary | ICD-10-CM | POA: Diagnosis not present

## 2018-10-05 DIAGNOSIS — J301 Allergic rhinitis due to pollen: Secondary | ICD-10-CM | POA: Diagnosis not present

## 2018-10-12 DIAGNOSIS — J3089 Other allergic rhinitis: Secondary | ICD-10-CM | POA: Diagnosis not present

## 2018-10-12 DIAGNOSIS — J301 Allergic rhinitis due to pollen: Secondary | ICD-10-CM | POA: Diagnosis not present

## 2018-10-18 DIAGNOSIS — J301 Allergic rhinitis due to pollen: Secondary | ICD-10-CM | POA: Diagnosis not present

## 2018-10-18 DIAGNOSIS — J3089 Other allergic rhinitis: Secondary | ICD-10-CM | POA: Diagnosis not present

## 2018-10-26 DIAGNOSIS — J301 Allergic rhinitis due to pollen: Secondary | ICD-10-CM | POA: Diagnosis not present

## 2018-11-02 DIAGNOSIS — J301 Allergic rhinitis due to pollen: Secondary | ICD-10-CM | POA: Diagnosis not present

## 2018-11-02 DIAGNOSIS — J3089 Other allergic rhinitis: Secondary | ICD-10-CM | POA: Diagnosis not present

## 2018-11-09 DIAGNOSIS — J301 Allergic rhinitis due to pollen: Secondary | ICD-10-CM | POA: Diagnosis not present

## 2018-11-09 DIAGNOSIS — J3089 Other allergic rhinitis: Secondary | ICD-10-CM | POA: Diagnosis not present

## 2018-11-10 ENCOUNTER — Other Ambulatory Visit: Payer: Self-pay | Admitting: Physician Assistant

## 2018-11-10 ENCOUNTER — Other Ambulatory Visit: Payer: Self-pay | Admitting: Adult Health

## 2018-11-10 DIAGNOSIS — J3089 Other allergic rhinitis: Secondary | ICD-10-CM | POA: Diagnosis not present

## 2018-11-10 DIAGNOSIS — J301 Allergic rhinitis due to pollen: Secondary | ICD-10-CM | POA: Diagnosis not present

## 2018-11-16 DIAGNOSIS — J301 Allergic rhinitis due to pollen: Secondary | ICD-10-CM | POA: Diagnosis not present

## 2018-11-16 DIAGNOSIS — J3089 Other allergic rhinitis: Secondary | ICD-10-CM | POA: Diagnosis not present

## 2018-11-17 ENCOUNTER — Encounter: Payer: Self-pay | Admitting: Physician Assistant

## 2018-11-25 DIAGNOSIS — J301 Allergic rhinitis due to pollen: Secondary | ICD-10-CM | POA: Diagnosis not present

## 2018-11-25 DIAGNOSIS — J3089 Other allergic rhinitis: Secondary | ICD-10-CM | POA: Diagnosis not present

## 2018-12-05 DIAGNOSIS — J3089 Other allergic rhinitis: Secondary | ICD-10-CM | POA: Diagnosis not present

## 2018-12-07 ENCOUNTER — Encounter: Payer: Self-pay | Admitting: Physician Assistant

## 2018-12-09 ENCOUNTER — Other Ambulatory Visit: Payer: Self-pay | Admitting: Internal Medicine

## 2018-12-13 DIAGNOSIS — J3089 Other allergic rhinitis: Secondary | ICD-10-CM | POA: Diagnosis not present

## 2018-12-13 DIAGNOSIS — J301 Allergic rhinitis due to pollen: Secondary | ICD-10-CM | POA: Diagnosis not present

## 2018-12-15 ENCOUNTER — Encounter (INDEPENDENT_AMBULATORY_CARE_PROVIDER_SITE_OTHER): Payer: Self-pay | Admitting: Physical Medicine and Rehabilitation

## 2018-12-15 ENCOUNTER — Ambulatory Visit (INDEPENDENT_AMBULATORY_CARE_PROVIDER_SITE_OTHER): Payer: Medicare HMO | Admitting: Physical Medicine and Rehabilitation

## 2018-12-15 ENCOUNTER — Telehealth (INDEPENDENT_AMBULATORY_CARE_PROVIDER_SITE_OTHER): Payer: Self-pay | Admitting: Physical Medicine and Rehabilitation

## 2018-12-15 VITALS — BP 131/74 | HR 65 | Wt 200.0 lb

## 2018-12-15 DIAGNOSIS — G8929 Other chronic pain: Secondary | ICD-10-CM

## 2018-12-15 DIAGNOSIS — M5116 Intervertebral disc disorders with radiculopathy, lumbar region: Secondary | ICD-10-CM | POA: Diagnosis not present

## 2018-12-15 DIAGNOSIS — M47816 Spondylosis without myelopathy or radiculopathy, lumbar region: Secondary | ICD-10-CM | POA: Diagnosis not present

## 2018-12-15 DIAGNOSIS — M5441 Lumbago with sciatica, right side: Secondary | ICD-10-CM | POA: Diagnosis not present

## 2018-12-15 DIAGNOSIS — M4316 Spondylolisthesis, lumbar region: Secondary | ICD-10-CM

## 2018-12-15 NOTE — Telephone Encounter (Signed)
Service [phone] Auth Effective Date:01/16/2020Auth End Date:07/14/2020Initiated Date:01/16/2020Decision Date:01/16/2020Decision Type :InitialCase Status:Approved

## 2018-12-15 NOTE — Progress Notes (Signed)
Numeric Pain Rating Scale and Functional Assessment Average Pain 8 Pain Right Now 8 My pain is sharp and burning Pain is worse with: walking and standing Pain improves with: rest, heat/ice and medication   In the last MONTH (on 0-10 scale) has pain interfered with the following?  1. General activity like being  able to carry out your everyday physical activities such as walking, climbing stairs, carrying groceries, or moving a chair?  Rating(7)  2. Relation with others like being able to carry out your usual social activities and roles such as  activities at home, at work and in your community. Rating(4)  3. Enjoyment of life such that you have  been bothered by emotional problems such as feeling anxious, depressed or irritable?  Rating(4)

## 2018-12-19 NOTE — Progress Notes (Signed)
COMPLETE PHYSICAL Assessment:   Essential hypertension - continue medications, DASH diet, exercise and monitor at home. Call if greater than 130/80.  -     CBC with Differential/Platelet -     COMPLETE METABOLIC PANEL WITH GFR -     TSH -     Urinalysis, Routine w reflex microscopic -     Microalbumin / creatinine urine ratio -     EKG 12-Lead  Type 2 diabetes mellitus without complication, without long-term current use of insulin ([place]) Discussed general issues about diabetes pathophysiology and management., Educational material distributed., Suggested low cholesterol diet., Encouraged aerobic exercise., Discussed foot care., Reminded to get yearly retinal exam. -     Hemoglobin A1c  Mixed hyperlipidemia check lipids decrease fatty foods increase activity. -     Lipid panel  Medication management -     Magnesium  Vitamin D deficiency -     VITAMIN D 25 Hydroxy (Vit-D Deficiency, Fractures)  Age related osteoporosis, unspecified pathological fracture presence -     Parathyroid Hormone, Intact w/Ca -     DG Bone Density; Future - on fosamax x 06/2017  Obesity (BMI 30.0-34.9) - follow up 3 months for progress monitoring - increase veggies, decrease carbs - long discussion about weight loss, diet, and exercise  Right-sided low back pain without sciatica, unspecified chronicity -     Parathyroid Hormone, Intact w/Ca - continue ortho follow up- with bone pain/osteoporosis will rule out PTH  Gastroesophageal reflux disease, esophagitis presence not specified Continue PPI/H2 blocker, diet discussed  Uncomplicated asthma, unspecified asthma severity, unspecified whether persistent Monitor  Need for prophylactic vaccination against Streptococcus pneumoniae (pneumococcus) -     Pneumococcal conjugate vaccine 13-valent IM  Screening, anemia, deficiency, iron -     Iron,Total/Total Iron Binding Cap -     Vitamin B12  Hot flashes -     escitalopram (LEXAPRO) 10 MG tablet;  Take 1 tablet (10 mg total) by mouth daily. - if this does not help will try paxil 10mg    Over 40 minutes of exam, counseling, chart review, and critical decision making was performed  Future Appointments  Date Time Provider [place]  01/02/2019  9:30 AM Magnus Sinning, MD [facility] None  02/09/2019 10:30 AM Liane Comber, NP [facility] None  12/28/2019 10:00 AM Vicie Mutters, PA-C [facility] None     Subjective:  Lauren Lopez is a 68 y.o. AA female who presents for CPE and 3 month follow up for HTN, hyperlipidemia, prediabetes, and vitamin D Def.   She has been following with ortho for her back pain, had an injection but states it did not help, she is following up with them. She has been taking the mobic 2 x a day and the flexeril. Worse right leg pain, worse at night trying to move around, has been taking move free supplements that are helping.    Her blood pressure has been controlled at home, today their BP is BP: 132/88 She does workout, she is going to the [facility] has not been going, had house painted, she is lifting weights and walking.  She denies chest pain, shortness of breath, dizziness.   Husband has leukemia, they are monitoring it, following up in March. He is having sweating and fatigue at night, he is having dementia as well.   She also has hot flashes.   She is on cholesterol medication and denies myalgias. Her cholesterol is at goal. The cholesterol last visit was:   Lab Results  Component  Value Date   CHOL 205 (H) 09/15/2018   HDL 71 09/15/2018   LDLCALC 102 (H) 09/15/2018   TRIG 197 (H) 09/15/2018   CHOLHDL 2.9 09/15/2018   She has been working on diet and exercise for diabetes with CKD, controlled with diet and metformin, she has worked hard to get it down, and denies foot ulcerations, hyperglycemia, hypoglycemia , increased appetite, nausea, paresthesia of the feet, polydipsia, polyuria, vomiting and weight loss. Last A1C in the office was:  Lab  Results  Component Value Date   HGBA1C 6.5 (H) 09/15/2018   Last GFR Lab Results  Component Value Date   GFRNONAA 69 09/15/2018   She is on fosamax for osteporosis x 06/2017. Started on calcium which helped the right leg pain.   Patient is on Vitamin D supplement, 5000 IU 2 a day but now at one a day.  Lab Results  Component Value Date   VD25OH 24 (L) 09/15/2018   BMI is Body mass index is 35.43 kg/m., she is working on diet and exercise. Wt Readings from Last 3 Encounters:  12/22/18 200 lb (90.7 kg)  12/15/18 200 lb (90.7 kg)  09/15/18 200 lb 12.8 oz (91.1 kg)    Medication Review: Current Outpatient Medications on File Prior to Visit  Medication Sig Dispense Refill  . alendronate (FOSAMAX) 70 MG tablet TAKE 1 TABLET EVERY 7 DAYS WITH A FULL GLASS OF WATER ON AN EMPTY STOMACH 12 tablet 3  . Apoaequorin (PREVAGEN PO) Take by mouth.    . calcium carbonate (CALCIUM 600) 600 MG TABS tablet Take 600 mg by mouth daily.    . Cholecalciferol (VITAMIN D3) 5000 UNITS TABS Take 5,000 Units by mouth daily.    . cyanocobalamin 100 MCG tablet Take 100 mcg by mouth daily.    . cyclobenzaprine (FLEXERIL) 10 MG tablet Take 1 tablet (10 mg total) by mouth at bedtime as needed for muscle spasms. 90 tablet 0  . fexofenadine (ALLEGRA) 180 MG tablet Take 1 tablet (180 mg total) by mouth daily. 90 tablet 6  . fluticasone-salmeterol (ADVAIR HFA) 115-21 MCG/ACT inhaler Inhale 2 puffs into the lungs 2 (two) times daily. 1 Inhaler 6  . Glucosamine-Chondroitin (MOVE FREE PO) Take by mouth.    . hydrochlorothiazide (HYDRODIURIL) 25 MG tablet TAKE 1 TABLET BY MOUTH EVERY DAY 90 tablet 2  . MAGNESIUM PO Take 500 mg by mouth daily.     . meloxicam (MOBIC) 15 MG tablet Take 1/2 to 1 tablet daily with Food for Pain & Inflammation and limit to 5 days/week to avoid Kidney Damage 90 tablet 1  . metFORMIN (GLUCOPHAGE-XR) 500 MG 24 hr tablet TAKE 1 TAB DAILY WITH LARGEST MEAL OF THE DAY. 90 tablet 1  . omeprazole  (PRILOSEC) 40 MG capsule TAKE 1 CAPSULE BY MOUTH EVERY DAY 90 capsule 0  . OVER THE COUNTER MEDICATION     . Red Yeast Rice Extract (RED YEAST RICE PO) Take 2,000 mg by mouth daily.     No current facility-administered medications on file prior to visit.     Allergies: Allergies  Allergen Reactions  . Metronidazole Other (See Comments)    unknown  . Pneumovax 23 [Pneumococcal Vac Polyvalent]     Redness and severe swelling at injection site  . Ppd [Tuberculin Purified Protein Derivative] Other (See Comments)    + PPD 2013 with NEG CXR 05/2012    Current Problems (verified) has Essential hypertension; GERD (gastroesophageal reflux disease); Mixed hyperlipidemia; Vitamin D deficiency; Asthma; T2_NIDDM;  Medication management; Back pain; Osteoporosis; and Obesity (BMI 30.0-34.9) on their problem list.  Screening Tests Immunization History  Administered Date(s) Administered  . Influenza, High Dose Seasonal PF 11/10/2017, 09/15/2018  . Influenza, Seasonal, Injecte, Preservative Fre 10/17/2015  . Influenza,inj,quad, With Preservative 10/15/2016  . Pneumococcal Conjugate-13 12/22/2018  . Pneumococcal Polysaccharide-23 02/04/2017  . Pneumococcal-Unspecified 06/27/2013  . Tdap 04/23/2012  . Zoster 07/31/2015    Preventative care: Last colonoscopy: 2019 had 6 polyps Pap: Hysterectomy Mammogram:  05/2018 DEXA: 05/25/2017 due 2 years started fosamax 06/2017 CXR 03/2016  Names of Other Physician/Practitioners you currently use: 1. [facility] here for primary care 2. Dr. Katy Fitch, eye doctor, last visit 09/30/2017 3. Dr. Mariea Clonts  , dentist, last visit 2018 Patient Care Team: Unk Pinto, MD as PCP - General (Internal Medicine) Sypher, Herbie Baltimore, MD (Inactive) as Consulting Physician (Orthopedic Surgery) Monna Fam, MD as Consulting Physician (Ophthalmology) Richmond Campbell, MD as Consulting Physician (Gastroenterology) Cheri Fowler, MD as  Consulting Physician (Obstetrics and Gynecology) Gaynelle Arabian, MD as Consulting Physician (Orthopedic Surgery) Marchia Bond, MD as Consulting Physician (Orthopedic Surgery) Mosetta Anis, MD as Referring Physician (Allergy) Larey Dresser, MD as Consulting Physician (Cardiology)  Surgical: She  has a past surgical history that includes Bilateral salpingoophorectomy (11/28/2010); Knee arthroscopy (Bilateral, 2005-2016); Colonoscopy; Carpal tunnel release (07/15/2012); Shoulder arthroscopy with rotator cuff repair and subacromial decompression (Right, 01/20/2013); Abdominal hysterectomy; Carpal tunnel release (Right, 05/31/2014); and Tubal ligation. Family Her family history includes Diabetes in her brother and mother; Heart disease in her brother; Hyperlipidemia in her brother; Hypertension in her father and mother; Stroke in her mother. Social history  She reports that she has never smoked. She has never used smokeless tobacco. She reports that she does not drink alcohol or use drugs.     Objective:   Today's Vitals   12/22/18 1117  BP: 132/88  Pulse: 76  Temp: (!) 97.1 F (36.2 C)  SpO2: 96%  Weight: 200 lb (90.7 kg)  Height: 5\' 3"  (1.6 m)   Body mass index is 35.43 kg/m.  General appearance: alert, no distress, WD/WN, female HEENT: normocephalic, sclerae anicteric, TMs pearly, nares patent, no discharge or erythema, pharynx normal Oral cavity: MMM, no lesions Neck: supple, no lymphadenopathy, no thyromegaly, no masses Heart: RRR, normal S1, S2, no murmurs Lungs: CTA bilaterally, no wheezes, rhonchi, or rales Abdomen: +bs, soft, non tender, non distended, no masses, no hepatomegaly, no splenomegaly Musculoskeletal: nontender, no swelling, no obvious deformity, Patient is able to ambulate well. Gait is  Antalgic. Straight leg raising with dorsiflexion negative bilaterally for radicular symptoms. Sensory exam in the legs are normal. Knee reflexes are normal Ankle reflexes are  normal Strength is normal and symmetric in arms and legs. There is not SI tenderness to palpation.  There is paraspinal muscle spasm.  There is not midline tenderness.  ROM of spine with  limited in all spheres due to pain.  Extremities: no edema, no cyanosis, no clubbing Pulses: 2+ symmetric, upper and lower extremities, normal cap refill Neurological: alert, oriented x 3, CN2-12 intact, strength normal upper extremities and lower extremities, sensation normal throughout, DTRs 2+ throughout, no cerebellar signs, gait normal Psychiatric: normal affect, behavior normal, pleasant      Vicie Mutters, PA-C   12/22/2018

## 2018-12-22 ENCOUNTER — Ambulatory Visit (INDEPENDENT_AMBULATORY_CARE_PROVIDER_SITE_OTHER): Payer: Medicare HMO | Admitting: Physician Assistant

## 2018-12-22 VITALS — BP 132/88 | HR 76 | Temp 97.1°F | Ht 63.0 in | Wt 200.0 lb

## 2018-12-22 DIAGNOSIS — E669 Obesity, unspecified: Secondary | ICD-10-CM

## 2018-12-22 DIAGNOSIS — I10 Essential (primary) hypertension: Secondary | ICD-10-CM

## 2018-12-22 DIAGNOSIS — Z136 Encounter for screening for cardiovascular disorders: Secondary | ICD-10-CM | POA: Diagnosis not present

## 2018-12-22 DIAGNOSIS — M545 Low back pain, unspecified: Secondary | ICD-10-CM

## 2018-12-22 DIAGNOSIS — E119 Type 2 diabetes mellitus without complications: Secondary | ICD-10-CM

## 2018-12-22 DIAGNOSIS — Z23 Encounter for immunization: Secondary | ICD-10-CM | POA: Diagnosis not present

## 2018-12-22 DIAGNOSIS — Z0000 Encounter for general adult medical examination without abnormal findings: Secondary | ICD-10-CM | POA: Diagnosis not present

## 2018-12-22 DIAGNOSIS — K219 Gastro-esophageal reflux disease without esophagitis: Secondary | ICD-10-CM

## 2018-12-22 DIAGNOSIS — E559 Vitamin D deficiency, unspecified: Secondary | ICD-10-CM

## 2018-12-22 DIAGNOSIS — R232 Flushing: Secondary | ICD-10-CM

## 2018-12-22 DIAGNOSIS — J45909 Unspecified asthma, uncomplicated: Secondary | ICD-10-CM

## 2018-12-22 DIAGNOSIS — Z79899 Other long term (current) drug therapy: Secondary | ICD-10-CM | POA: Diagnosis not present

## 2018-12-22 DIAGNOSIS — J301 Allergic rhinitis due to pollen: Secondary | ICD-10-CM | POA: Diagnosis not present

## 2018-12-22 DIAGNOSIS — Z130 Encounter for screening for diseases of the blood and blood-forming organs and certain disorders involving the immune mechanism: Secondary | ICD-10-CM | POA: Diagnosis not present

## 2018-12-22 DIAGNOSIS — J3089 Other allergic rhinitis: Secondary | ICD-10-CM | POA: Diagnosis not present

## 2018-12-22 DIAGNOSIS — E782 Mixed hyperlipidemia: Secondary | ICD-10-CM

## 2018-12-22 DIAGNOSIS — M810 Age-related osteoporosis without current pathological fracture: Secondary | ICD-10-CM | POA: Diagnosis not present

## 2018-12-22 DIAGNOSIS — J3081 Allergic rhinitis due to animal (cat) (dog) hair and dander: Secondary | ICD-10-CM | POA: Diagnosis not present

## 2018-12-22 MED ORDER — ESCITALOPRAM OXALATE 10 MG PO TABS: 10.0000 mg | ORAL_TABLET | Freq: Every day | ORAL | 1 refills | Status: DC

## 2018-12-22 NOTE — Patient Instructions (Addendum)
HOW TO SCHEDULE A MAMMOGRAM  [facility]  7 a.m.-6:30 p.m., Monday 7 a.m.-5 p.m., Tuesday-Friday Schedule an appointment by calling [phone].   Mobic is an antiinflammatory- can only take it once a day It helps pain, can not take with aleve, or ibuprofen You can take tylenol (500mg ) or tylenol arthritis (650mg ) with the meloxicam/antiinflammatories. The max you can take of tylenol a day is 3000mg  daily, this is a max of 6 pills a day of the regular tyelnol (500mg ) or a max of 4 a day of the tylenol arthritis (650mg ) as long as no other medications you are taking contain tylenol.   Mobic can cause inflammation in your stomach and can cause ulcers or bleeding, this will look like black tarry stools Make sure you take your mobic with food Try not to take it daily, take AS needed Can take with pepcid   Take the lexapro for hot flashes, give it at least a month to see if it helps Can take morning or night  Check out  Mini habits for weight loss book  2 apps for tracking food is myfitness pal  loseit OR can take picture of your food  WATER IS IMPORTANT  Being dehydrated can hurt your kidneys, cause fatigue, headaches, muscle aches, joint pain, and dry skin/nails so please increase your fluids.   Drink 80-100 oz a day of water, measure it out! Eat 3 meals a day, have to do breakfast, eat protein- hard boiled eggs, protein bar like [place] protein bar, greek yogurt like oikos triple zero, chobani 100, or light n fit greek  Can check out plantnanny app on your phone to help you keep track of your water    Google mindful eating and here are some tips and tricks below.   Rate your hunger before you eat on a scale of 1-10, try to eat closer to a 6 or higher. And if you are at below that, why are you eating? Slow down and listen to your body.          When it comes to diets, agreement about the perfect plan isn't easy to find, even among  the experts. Experts at the [employer] developed an idea known as the Healthy Eating Plate. Just imagine a plate divided into logical, healthy portions.  The emphasis is on diet quality:  Load up on vegetables and fruits - one-half of your plate: Aim for color and variety, and remember that potatoes don't count.  Go for whole grains - one-quarter of your plate: Whole wheat, barley, wheat berries, quinoa, oats, brown rice, and foods made with them. If you want pasta, go with whole wheat pasta.  Protein power - one-quarter of your plate: Fish, chicken, beans, and nuts are all healthy, versatile protein sources. Limit red meat.  The diet, however, does go beyond the plate, offering a few other suggestions.  Use healthy plant oils, such as olive, canola, soy, corn, sunflower and peanut. Check the labels, and avoid partially hydrogenated oil, which have unhealthy trans fats.  If you're thirsty, drink water. Coffee and tea are good in moderation, but skip sugary drinks and limit milk and dairy products to one or two daily servings.  The type of carbohydrate in the diet is more important than the amount. Some sources of carbohydrates, such as vegetables, fruits, whole grains, and beans-are healthier than others.  Finally, stay active.

## 2018-12-23 ENCOUNTER — Encounter (INDEPENDENT_AMBULATORY_CARE_PROVIDER_SITE_OTHER): Payer: Self-pay | Admitting: Physical Medicine and Rehabilitation

## 2018-12-23 LAB — CBC WITH DIFFERENTIAL/PLATELET
Absolute Monocytes: 290 cells/uL (ref 200–950)
BASOS ABS: 19 {cells}/uL (ref 0–200)
BASOS PCT: 0.3 %
EOS ABS: 113 {cells}/uL (ref 15–500)
EOS PCT: 1.8 %
HCT: 38.3 % (ref 35.0–45.0)
Hemoglobin: 12.2 g/dL (ref 11.7–15.5)
Lymphs Abs: 3490 cells/uL (ref 850–3900)
MCH: 27.3 pg (ref 27.0–33.0)
MCHC: 31.9 g/dL — AB (ref 32.0–36.0)
MCV: 85.7 fL (ref 80.0–100.0)
MONOS PCT: 4.6 %
MPV: 11.9 fL (ref 7.5–12.5)
Neutro Abs: 2388 cells/uL (ref 1500–7800)
Neutrophils Relative %: 37.9 %
PLATELETS: 277 10*3/uL (ref 140–400)
RBC: 4.47 10*6/uL (ref 3.80–5.10)
RDW: 13.3 % (ref 11.0–15.0)
TOTAL LYMPHOCYTE: 55.4 %
WBC: 6.3 10*3/uL (ref 3.8–10.8)

## 2018-12-23 LAB — COMPLETE METABOLIC PANEL WITH GFR
AG RATIO: 1.4 (calc) (ref 1.0–2.5)
ALBUMIN MSPROF: 4.3 g/dL (ref 3.6–5.1)
ALT: 15 U/L (ref 6–29)
AST: 18 U/L (ref 10–35)
Alkaline phosphatase (APISO): 93 U/L (ref 33–130)
BILIRUBIN TOTAL: 0.6 mg/dL (ref 0.2–1.2)
BUN: 13 mg/dL (ref 7–25)
CALCIUM: 10.2 mg/dL (ref 8.6–10.4)
CHLORIDE: 103 mmol/L (ref 98–110)
CO2: 28 mmol/L (ref 20–32)
Creat: 0.89 mg/dL (ref 0.50–0.99)
GFR, EST AFRICAN AMERICAN: 78 mL/min/{1.73_m2} (ref >=60)
GFR, EST NON AFRICAN AMERICAN: 67 mL/min/{1.73_m2} (ref >=60)
Globulin: 3 g/dL (calc) (ref 1.9–3.7)
Glucose, Bld: 109 mg/dL — ABNORMAL HIGH (ref 65–99)
POTASSIUM: 4 mmol/L (ref 3.5–5.3)
Sodium: 139 mmol/L (ref 135–146)
TOTAL PROTEIN: 7.3 g/dL (ref 6.1–8.1)

## 2018-12-23 LAB — LIPID PANEL
Cholesterol: 219 mg/dL — ABNORMAL HIGH (ref <200)
HDL: 83 mg/dL (ref >50)
LDL Cholesterol (Calc): 121 mg/dL (calc) — ABNORMAL HIGH
Non-HDL Cholesterol (Calc): 136 mg/dL (calc) — ABNORMAL HIGH (ref <130)
Total CHOL/HDL Ratio: 2.6 (calc) (ref <5.0)
Triglycerides: 56 mg/dL (ref <150)

## 2018-12-23 LAB — VITAMIN D 25 HYDROXY (VIT D DEFICIENCY, FRACTURES): VIT D 25 HYDROXY: 46 ng/mL (ref 30–100)

## 2018-12-23 LAB — HEMOGLOBIN A1C
Hgb A1c MFr Bld: 6.4 % of total Hgb — ABNORMAL HIGH (ref <5.7)
Mean Plasma Glucose: 137 (calc)
eAG (mmol/L): 7.6 (calc)

## 2018-12-23 LAB — URINALYSIS, ROUTINE W REFLEX MICROSCOPIC
Bilirubin Urine: NEGATIVE
Glucose, UA: NEGATIVE
HGB URINE DIPSTICK: NEGATIVE
Ketones, ur: NEGATIVE
Leukocytes, UA: NEGATIVE
Nitrite: NEGATIVE
Protein, ur: NEGATIVE
Specific Gravity, Urine: 1.014 (ref 1.001–1.03)
pH: 6.5 (ref 5.0–8.0)

## 2018-12-23 LAB — IRON, TOTAL/TOTAL IRON BINDING CAP
%SAT: 19 % (calc) (ref 16–45)
Iron: 66 ug/dL (ref 45–160)
TIBC: 346 ug/dL (ref 250–450)

## 2018-12-23 LAB — VITAMIN B12: Vitamin B-12: >2000 pg/mL — ABNORMAL HIGH (ref 200–1100)

## 2018-12-23 LAB — MICROALBUMIN / CREATININE URINE RATIO
Creatinine, Urine: 56 mg/dL (ref 20–275)
MICROALB/CREAT RATIO: 4 ug/mg{creat} (ref <30)
Microalb, Ur: 0.2 mg/dL

## 2018-12-23 LAB — TSH: TSH: 2.31 mIU/L (ref 0.40–4.50)

## 2018-12-23 LAB — PTH, INTACT AND CALCIUM
Calcium: 10.2 mg/dL (ref 8.6–10.4)
PTH: 21 pg/mL (ref 14–64)

## 2018-12-23 LAB — MAGNESIUM: Magnesium: 1.8 mg/dL (ref 1.5–2.5)

## 2018-12-23 NOTE — Progress Notes (Signed)
Lauren Lopez - 68 y.o. female MRN [id number]  Date of birth: 01/14/1951  Office Visit Note: Visit Date: 12/15/2018 PCP: Unk Pinto, MD Referred by: Unk Pinto, MD  Subjective: Chief Complaint  Patient presents with  . Lower Back - Pain   HPI: Lauren Lopez is a 68 y.o. female who comes in today For reevaluation of low back and right hip pain with some referral into the thigh that is been ongoing for at least 3 years.  She is followed by Dr. Meridee Score in our office for orthopedic care.  By way of review we completed diagnostic hip injection which was not beneficial.  She went on to have MRI performed and reevaluation by Dr. Sharol Given and then MRI is reviewed with her again today.  The last time we saw her was in August and we completed an L3 transforaminal injection based on the MRI findings of her lower spine.  She tells me today that this did not help much at all.  I am not sure I really get a good answer as to why she waited this long before coming back again.  We usually try to have the patient understand to call us back depending on results.  Nonetheless, she has had continued 8 out of 10 severe low back pain with right-sided really low back posterior hip pain no groin pain.  Some pain in the thigh but no paresthesia.  No focal weakness no new trauma.  No real changes.  She gets some relief with pain medication and heating pad and Flexeril.  Her worst pain is with standing for a long time going from sit to stand.  She also gets pain laying in the bed at night with rotation.  Interesting finding on MRI was a T12-L1 disc extrusion which is clearly left-sided not right-sided.  She has multilevel facet arthropathy at L3-4 and L4-5 with an L5 sacralized transitional segment.  Review of Systems  Constitutional: Negative for chills, fever, malaise/fatigue and weight loss.  HENT: Negative for hearing loss and sinus pain.   Eyes: Negative for blurred vision, double vision and  photophobia.  Respiratory: Negative for cough and shortness of breath.   Cardiovascular: Negative for chest pain, palpitations and leg swelling.  Gastrointestinal: Negative for abdominal pain, nausea and vomiting.  Genitourinary: Negative for flank pain.  Musculoskeletal: Positive for back pain and joint pain. Negative for myalgias.  Skin: Negative for itching and rash.  Neurological: Negative for tremors, focal weakness and weakness.  Endo/Heme/Allergies: Negative.   Psychiatric/Behavioral: Negative for depression.  All other systems reviewed and are negative.  Otherwise per HPI.  Assessment & Plan: Visit Diagnoses:  1. Spondylosis without myelopathy or radiculopathy, lumbar region   2. Spondylolisthesis of lumbar region   3. Radiculopathy due to lumbar intervertebral disc disorder   4. Chronic right-sided low back pain with right-sided sciatica     Plan: Findings:  Chronic low back and right lower back and what she refers to his hip pain.  Some referral into the thigh but the biggest pain is severe low back pain.  This is worse with standing and extension worse on exam with facet loading.  I think this really is facet mediated low back pain more than radicular pain.  She has a small listhesis at L3-4 on MRI without focal nerve compression or stenosis.  There is no real leg pain.  Epidural injection diagnostically did not help.  Injection of the hip intra-articularly did not help as well.  Discussed this with her at length and I think facet joint block would be worthwhile on the right at L3-4 and L4-5.  This numbering scheme assumes a transitional L5 segment which is sacralized.  If she gets good relief with that diagnostically we could pursue radiofrequency ablation.  She will continue with home exercises and activity modification and current medication regimen.    Meds & Orders: No orders of the defined types were placed in this encounter.  No orders of the defined types were placed in  this encounter.   Follow-up: Return for Right L3-4 and L4-5 facet joint block.   Procedures: No procedures performed  No notes on file   Clinical History: MRI LUMBAR SPINE WITHOUT CONTRAST  TECHNIQUE: Multiplanar, multisequence MR imaging of the lumbar spine was performed. No intravenous contrast was administered.  COMPARISON:  Lumbar radiographs 02/24/2018 and earlier. CT Abdomen and Pelvis 10/20/2010. chest CTA 04/03/2016.  FINDINGS: Segmentation: 12 pairs of ribs including the lowest which are hypoplastic demonstrated on the 2017 CTA. Subsequently there are 4 lumbar type vertebral bodies. Therefore, the L5 level is fully sacralized with a vestigial L5-S1 disc space. Correlation with radiographs is recommended prior to any operative intervention.  Alignment: Chronic grade 1 anterolisthesis of L3 on L4 measuring 3-4 millimeters appears increased since 2011. Stable vertebral height and alignment elsewhere with mild straightening of lumbar lordosis.  Vertebrae: No marrow edema or evidence of acute osseous abnormality. Visualized bone marrow signal is within normal limits. Intact visible sacrum and SI joints.  Conus medullaris and cauda equina: Conus extends to the L1 level. No lower spinal cord or conus signal abnormality.  Paraspinal and other soft tissues: Stable since 2011 and negative visible abdominal viscera. Negative visualized posterior paraspinal soft tissues.  Disc levels:  T10-T11: Negative.  T11-T12: Negative.  T12-L1: Mild disc bulging with cephalad extruded disc fragment occupying the left ventral epidural space posterior to the T12 vertebral body (series 8, image 8, series 11, image 4. The extruded disc fragment encompasses 4 x 10 x 10 millimeters (AP by transverse by CC). This is in proximity to the descending left T12 nerve roots in the lateral recess. No significant spinal or foraminal stenosis.  L1-L2: Minimal far lateral disc bulging  and facet hypertrophy. No stenosis.  L2-L3: Mild far lateral disc bulging and endplate spurring. Moderate facet hypertrophy. No significant stenosis.  L3-L4: Grade 1 anterolisthesis. Disc desiccation and mild disc space loss with circumferential but mostly foraminal and far lateral disc bulging. Moderate to severe facet and ligament flavum hypertrophy. Mild spinal stenosis with mild bilateral lateral recess stenosis (L4 nerve levels), and mild to moderate bilateral L3 neural foraminal stenosis.  L4-L5: Vacuum disc. Circumferential disc bulge with broad-based foraminal involvement. Moderate facet and ligament flavum hypertrophy. No spinal or lateral recess stenosis. Mild to moderate bilateral L4 foraminal stenosis appears greater on the right.  L5-S1:  Sacralized and negative.  IMPRESSION: 1. 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 Correlation with radiographs is recommended prior to any operative intervention. 2. Grade 1 spondylolisthesis at L3-L4 is chronic but progressed since 2011. There is multifactorial mild spinal stenosis with mild lateral recess and up to moderate neural foraminal stenosis. Query right L3 and/or L4 radiculitis. 3. Up to moderate multifactorial neural foraminal stenosis also at L4-L5; the exiting L4 nerve levels. 4. There is a small left-side extruded disc fragment posterior to the T12 vertebral body. No significant spinal stenosis, this could be a source for left T12 radiculitis. 5. Mild for  age lumbar spine degeneration elsewhere.   Electronically Signed   By: Genevie Ann M.D.   On: 06/21/2018 09:11   She reports that she has never smoked. She has never used smokeless tobacco.  Recent Labs    05/31/18 1113 09/15/18 1611 12/22/18 1210  HGBA1C 7.0* 6.5* 6.4*    Objective:  VS:  HT:    WT:200 lb (90.7 kg)  BMI:     BP:131/74  HR:65bpm  TEMP: ( )  RESP:  Physical Exam Vitals signs and nursing note reviewed.    Constitutional:      General: She is not in acute distress.    Appearance: Normal appearance. She is well-developed. She is obese.  HENT:     Head: Normocephalic and atraumatic.     Nose: Nose normal.     Mouth/Throat:     Mouth: Mucous membranes are moist.     Pharynx: Oropharynx is clear.  Eyes:     Conjunctiva/sclera: Conjunctivae normal.     Pupils: Pupils are equal, round, and reactive to light.  Neck:     Musculoskeletal: Normal range of motion and neck supple.  Cardiovascular:     Rate and Rhythm: Regular rhythm.  Pulmonary:     Effort: Pulmonary effort is normal. No respiratory distress.  Abdominal:     General: There is no distension.     Palpations: Abdomen is soft.     Tenderness: There is no guarding.  Musculoskeletal:     Right lower leg: Edema present.     Left lower leg: Edema present.     Comments: Patient has difficulty going from sit to full extension and has pain with facet joint loading.  This is concordant low back pain particularly to the right.  She has no pain over the PSIS and no pain over the greater trochanters.  She has some decreased movement of the hip on the right but no pain reproduction no groin pain.  She has good distal strength without clonus.  Skin:    General: Skin is warm and dry.     Findings: No erythema or rash.  Neurological:     General: No focal deficit present.     Mental Status: She is alert and oriented to person, place, and time.     Motor: No abnormal muscle tone.     Coordination: Coordination normal.     Gait: Gait normal.  Psychiatric:        Mood and Affect: Mood normal.        Behavior: Behavior normal.        Thought Content: Thought content normal.     Ortho Exam Imaging: No results found.  Past Medical/Family/Surgical/Social History: Medications & Allergies reviewed per EMR, new medications updated. Patient Active Problem List   Diagnosis Date Noted  . Obesity (BMI 30.0-34.9) 05/30/2018  . Osteoporosis  06/01/2017  . Back pain 04/05/2016  . Medication management 01/29/2015  . T2_NIDDM 10/10/2014  . GERD (gastroesophageal reflux disease)   . Mixed hyperlipidemia   . Vitamin D deficiency   . Asthma   . Essential hypertension 08/09/2009   Past Medical History:  Diagnosis Date  . Anemia   . Arthritis    "right hand, back" (04/03/2016)  . Asthma   . Bilateral carpal tunnel syndrome   . Chronic lower back pain   . GERD (gastroesophageal reflux disease)   . Hyperlipidemia   . Hypertension   . Multiple allergies   . Pneumonia 03/2012  . Positive PPD    "  they told me it wasn't positive; I was allergic to the test itself"  . Type II diabetes mellitus ([place])   . Vitamin deficiency   . Wears dentures    upper  . Wears glasses    Family History  Problem Relation Age of Onset  . Hypertension Mother   . Diabetes Mother   . Stroke Mother   . Hypertension Father   . Heart disease Brother   . Hyperlipidemia Brother   . Diabetes Brother    Past Surgical History:  Procedure Laterality Date  . ABDOMINAL HYSTERECTOMY    . BILATERAL SALPINGOOPHORECTOMY  11/28/2010   open laparoscopy with adhesiolysis also  . CARPAL TUNNEL RELEASE  07/15/2012   Procedure: CARPAL TUNNEL RELEASE;  Surgeon: Cammie Sickle., MD;  Location: [facility];  Service: Orthopedics;  Laterality: Left;  . CARPAL TUNNEL RELEASE Right 05/31/2014   Procedure: RIGHT CARPAL TUNNEL RELEASE;  Surgeon: Cammie Sickle, MD;  Location: [facility];  Service: Orthopedics;  Laterality: Right;  . COLONOSCOPY    . KNEE ARTHROSCOPY Bilateral 2005-2016   "right-left"  . SHOULDER ARTHROSCOPY WITH ROTATOR CUFF REPAIR AND SUBACROMIAL DECOMPRESSION Right 01/20/2013   Procedure: RIGHT ARTHROSCOPY SHOULDER DEBRIDEMENT LIMITED, ARTHROSCOPY SHOULDER DECOMPRESSION SUBACROMIAL PARTIAL ACROMIOPLASTY WITH CORACOACROMIAL RELEASE, ROTATOR CUFF REPAIR ;  Surgeon: Johnny Bridge, MD;  Location: [facility];  Service: Orthopedics;  Laterality: Right;  RIGHT SHOULDER SCOPE DEBRIDEMENT, ACRIOMIOPLASTY, ROTATOR CUFF REPAIR  . TUBAL LIGATION     Social History   Occupational History  . Not on file  Tobacco Use  . Smoking status: Never Smoker  . Smokeless tobacco: Never Used  Substance and Sexual Activity  . Alcohol use: No  . Drug use: No  . Sexual activity: Not Currently

## 2018-12-29 DIAGNOSIS — J301 Allergic rhinitis due to pollen: Secondary | ICD-10-CM | POA: Diagnosis not present

## 2018-12-29 DIAGNOSIS — J3089 Other allergic rhinitis: Secondary | ICD-10-CM | POA: Diagnosis not present

## 2019-01-02 ENCOUNTER — Encounter (INDEPENDENT_AMBULATORY_CARE_PROVIDER_SITE_OTHER): Payer: Self-pay | Admitting: Physical Medicine and Rehabilitation

## 2019-01-02 ENCOUNTER — Ambulatory Visit (INDEPENDENT_AMBULATORY_CARE_PROVIDER_SITE_OTHER): Payer: Medicare HMO | Admitting: Physical Medicine and Rehabilitation

## 2019-01-02 ENCOUNTER — Ambulatory Visit (INDEPENDENT_AMBULATORY_CARE_PROVIDER_SITE_OTHER): Payer: Self-pay

## 2019-01-02 VITALS — BP 130/64 | HR 66 | Temp 97.7°F

## 2019-01-02 DIAGNOSIS — M4316 Spondylolisthesis, lumbar region: Secondary | ICD-10-CM

## 2019-01-02 DIAGNOSIS — M47816 Spondylosis without myelopathy or radiculopathy, lumbar region: Secondary | ICD-10-CM | POA: Diagnosis not present

## 2019-01-02 MED ORDER — METHYLPREDNISOLONE ACETATE 80 MG/ML IJ SUSP
80.0000 mg | Freq: Once | INTRAMUSCULAR | Status: AC
  Administered 2019-01-02: 80 mg

## 2019-01-02 NOTE — Progress Notes (Signed)
Lauren Lopez - 68 y.o. female MRN [id number]  Date of birth: [birth year]  Office Visit Note: Visit Date: 01/02/2019 PCP: Unk Pinto, MD Referred by: Unk Pinto, MD  Subjective: Chief Complaint  Patient presents with  . Lower Back - Pain  . Right Thigh - Pain   HPI:  Lauren Lopez is a 68 y.o. female who comes in today For planned right L3-4 and L4-5 facet joint injection.  Please see our prior evaluation and management note for further details and justification.  Please note that the patient nomenclature for her lumbar spine assumes a completely sacralized L5 segment.  Based on this numbering scheme we did complete a right L3-4 and L4-5 facet joint block.  This will appear to be an L4-5 and L5-S1 with quick examination of the image if not using the appropriate numbering scheme.  ROS Otherwise per HPI.  Assessment & Plan: Visit Diagnoses:  1. Spondylosis without myelopathy or radiculopathy, lumbar region   2. Spondylolisthesis of lumbar region     Plan: No additional findings.   Meds & Orders:  Meds ordered this encounter  Medications  . methylPREDNISolone acetate (DEPO-MEDROL) injection 80 mg    Orders Placed This Encounter  Procedures  . Facet Injection  . XR C-ARM NO REPORT    Follow-up: Return if symptoms worsen or fail to improve.   Procedures: No procedures performed  Lumbar Facet Joint Intra-Articular Injection(s) with Fluoroscopic Guidance  Patient: Lauren Lopez      Date of Birth: 1951/07/04 MRN: [id number] PCP: Unk Pinto, MD      Visit Date: 01/02/2019   Universal Protocol:    Date/Time: 01/02/2019  Consent Given By: the patient  Position: PRONE   Additional Comments: Vital signs were monitored before and after the procedure. Patient was prepped and draped in the usual sterile fashion. The correct patient, procedure, and site was verified.   Injection Procedure Details:  Procedure Site One Meds Administered:    Meds ordered this encounter  Medications  . methylPREDNISolone acetate (DEPO-MEDROL) injection 80 mg     Laterality: Right  Location/Site: Patient is assumed to have a fully sacralized L5 segment with hypoplastic ribs on the T12 vertebral body.  When glancing at the fluoroscopic images this will appear to be an L4-5 and L5-S1 facet joint block but the numbering scheme is truly as indicated in the note which is L3-4 and L4-5. L3-L4 L4-L5  Needle size: 22 guage  Needle type: Spinal  Needle Placement: Articular  Findings:  -Comments: Excellent flow of contrast producing a partial arthrogram.  Procedure Details: The fluoroscope beam is vertically oriented in AP, and the inferior recess is visualized beneath the lower pole of the inferior apophyseal process, which represents the target point for needle insertion. When direct visualization is difficult the target point is located at the medial projection of the vertebral pedicle. The region overlying each aforementioned target is locally anesthetized with a 1 to 2 ml. volume of 1% Lidocaine without Epinephrine.   The spinal needle was inserted into each of the above mentioned facet joints using biplanar fluoroscopic guidance. A 0.25 to 0.5 ml. volume of Isovue-250 was injected and a partial facet joint arthrogram was obtained. A single spot film was obtained of the resulting arthrogram.    One to 1.25 ml of the steroid/anesthetic solution was then injected into each of the facet joints noted above.   Additional Comments:  The patient tolerated the procedure well Dressing: Band-Aid    Post-procedure  details: Patient was observed during the procedure. Post-procedure instructions were reviewed.  Patient left the clinic in stable condition.    Clinical History: MRI LUMBAR SPINE WITHOUT CONTRAST  TECHNIQUE: Multiplanar, multisequence MR imaging of the lumbar spine was performed. No intravenous contrast was  administered.  COMPARISON:  Lumbar radiographs 02/24/2018 and earlier. CT Abdomen and Pelvis 10/20/2010. chest CTA 04/03/2016.  FINDINGS: Segmentation: 12 pairs of ribs including the lowest which are hypoplastic demonstrated on the 2017 CTA. Subsequently there are 4 lumbar type vertebral bodies. Therefore, the L5 level is fully sacralized with a vestigial L5-S1 disc space. Correlation with radiographs is recommended prior to any operative intervention.  Alignment: Chronic grade 1 anterolisthesis of L3 on L4 measuring 3-4 millimeters appears increased since 2011. Stable vertebral height and alignment elsewhere with mild straightening of lumbar lordosis.  Vertebrae: No marrow edema or evidence of acute osseous abnormality. Visualized bone marrow signal is within normal limits. Intact visible sacrum and SI joints.  Conus medullaris and cauda equina: Conus extends to the L1 level. No lower spinal cord or conus signal abnormality.  Paraspinal and other soft tissues: Stable since 2011 and negative visible abdominal viscera. Negative visualized posterior paraspinal soft tissues.  Disc levels:  T10-T11: Negative.  T11-T12: Negative.  T12-L1: Mild disc bulging with cephalad extruded disc fragment occupying the left ventral epidural space posterior to the T12 vertebral body (series 8, image 8, series 11, image 4. The extruded disc fragment encompasses 4 x 10 x 10 millimeters (AP by transverse by CC). This is in proximity to the descending left T12 nerve roots in the lateral recess. No significant spinal or foraminal stenosis.  L1-L2: Minimal far lateral disc bulging and facet hypertrophy. No stenosis.  L2-L3: Mild far lateral disc bulging and endplate spurring. Moderate facet hypertrophy. No significant stenosis.  L3-L4: Grade 1 anterolisthesis. Disc desiccation and mild disc space loss with circumferential but mostly foraminal and far lateral disc bulging. Moderate  to severe facet and ligament flavum hypertrophy. Mild spinal stenosis with mild bilateral lateral recess stenosis (L4 nerve levels), and mild to moderate bilateral L3 neural foraminal stenosis.  L4-L5: Vacuum disc. Circumferential disc bulge with broad-based foraminal involvement. Moderate facet and ligament flavum hypertrophy. No spinal or lateral recess stenosis. Mild to moderate bilateral L4 foraminal stenosis appears greater on the right.  L5-S1:  Sacralized and negative.  IMPRESSION: 1. 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 Correlation with radiographs is recommended prior to any operative intervention. 2. Grade 1 spondylolisthesis at L3-L4 is chronic but progressed since 2011. There is multifactorial mild spinal stenosis with mild lateral recess and up to moderate neural foraminal stenosis. Query right L3 and/or L4 radiculitis. 3. Up to moderate multifactorial neural foraminal stenosis also at L4-L5; the exiting L4 nerve levels. 4. There is a small left-side extruded disc fragment posterior to the T12 vertebral body. No significant spinal stenosis, this could be a source for left T12 radiculitis. 5. Mild for age lumbar spine degeneration elsewhere.   Electronically Signed   By: Genevie Ann M.D.   On: 06/21/2018 09:11     Objective:  VS:  HT:    WT:   BMI:     BP:130/64  HR:66bpm  TEMP:97.7 F (36.5 C)(Oral)  RESP:  Physical Exam  Ortho Exam Imaging: Xr C-arm No Report  Result Date: 01/02/2019 Please see Notes tab for imaging impression.

## 2019-01-02 NOTE — Procedures (Signed)
Lumbar Facet Joint Intra-Articular Injection(s) with Fluoroscopic Guidance  Patient: Lauren Lopez      Date of Birth: 06-Dec-1950 MRN: [id number] PCP: Unk Pinto, MD      Visit Date: 01/02/2019   Universal Protocol:    Date/Time: 01/02/2019  Consent Given By: the patient  Position: PRONE   Additional Comments: Vital signs were monitored before and after the procedure. Patient was prepped and draped in the usual sterile fashion. The correct patient, procedure, and site was verified.   Injection Procedure Details:  Procedure Site One Meds Administered:  Meds ordered this encounter  Medications  . methylPREDNISolone acetate (DEPO-MEDROL) injection 80 mg     Laterality: Right  Location/Site: Patient is assumed to have a fully sacralized L5 segment with hypoplastic ribs on the T12 vertebral body.  When glancing at the fluoroscopic images this will appear to be an L4-5 and L5-S1 facet joint block but the numbering scheme is truly as indicated in the note which is L3-4 and L4-5. L3-L4 L4-L5  Needle size: 22 guage  Needle type: Spinal  Needle Placement: Articular  Findings:  -Comments: Excellent flow of contrast producing a partial arthrogram.  Procedure Details: The fluoroscope beam is vertically oriented in AP, and the inferior recess is visualized beneath the lower pole of the inferior apophyseal process, which represents the target point for needle insertion. When direct visualization is difficult the target point is located at the medial projection of the vertebral pedicle. The region overlying each aforementioned target is locally anesthetized with a 1 to 2 ml. volume of 1% Lidocaine without Epinephrine.   The spinal needle was inserted into each of the above mentioned facet joints using biplanar fluoroscopic guidance. A 0.25 to 0.5 ml. volume of Isovue-250 was injected and a partial facet joint arthrogram was obtained. A single spot film was obtained of the  resulting arthrogram.    One to 1.25 ml of the steroid/anesthetic solution was then injected into each of the facet joints noted above.   Additional Comments:  The patient tolerated the procedure well Dressing: Band-Aid    Post-procedure details: Patient was observed during the procedure. Post-procedure instructions were reviewed.  Patient left the clinic in stable condition.

## 2019-01-02 NOTE — Progress Notes (Signed)
 .  Numeric Pain Rating Scale and Functional Assessment Average Pain 8   In the last MONTH (on 0-10 scale) has pain interfered with the following?  1. General activity like being  able to carry out your everyday physical activities such as walking, climbing stairs, carrying groceries, or moving a chair?  Rating(5)   +Driver, -BT, -Dye Allergies.  

## 2019-01-03 DIAGNOSIS — J3089 Other allergic rhinitis: Secondary | ICD-10-CM | POA: Diagnosis not present

## 2019-01-11 DIAGNOSIS — J3089 Other allergic rhinitis: Secondary | ICD-10-CM | POA: Diagnosis not present

## 2019-01-11 DIAGNOSIS — J301 Allergic rhinitis due to pollen: Secondary | ICD-10-CM | POA: Diagnosis not present

## 2019-01-17 ENCOUNTER — Telehealth (INDEPENDENT_AMBULATORY_CARE_PROVIDER_SITE_OTHER): Payer: Self-pay | Admitting: Physical Medicine and Rehabilitation

## 2019-01-17 DIAGNOSIS — J3089 Other allergic rhinitis: Secondary | ICD-10-CM | POA: Diagnosis not present

## 2019-01-17 DIAGNOSIS — J301 Allergic rhinitis due to pollen: Secondary | ICD-10-CM | POA: Diagnosis not present

## 2019-01-17 NOTE — Telephone Encounter (Signed)
She is co-ahead and repeat the last facet joint block and if that is beneficial but short-term we could look at radiofrequency ablation.  We can talk to her about that when she comes in.

## 2019-01-18 NOTE — Telephone Encounter (Signed)
Needs auth for repeat [phone] and [phone]. Scheduled for 3/5 pending.

## 2019-01-18 NOTE — Telephone Encounter (Signed)
Pt has Medicare no pa is needed for [phone] and [phone]. Pt insurance card was scanned 12/22/2018.

## 2019-01-23 DIAGNOSIS — J301 Allergic rhinitis due to pollen: Secondary | ICD-10-CM | POA: Diagnosis not present

## 2019-01-23 DIAGNOSIS — J3089 Other allergic rhinitis: Secondary | ICD-10-CM | POA: Diagnosis not present

## 2019-01-24 ENCOUNTER — Other Ambulatory Visit: Payer: Self-pay

## 2019-01-24 MED ORDER — BLOOD GLUCOSE METER KIT: PACK | 0 refills | Status: AC

## 2019-01-25 DIAGNOSIS — R69 Illness, unspecified: Secondary | ICD-10-CM | POA: Diagnosis not present

## 2019-01-31 DIAGNOSIS — J301 Allergic rhinitis due to pollen: Secondary | ICD-10-CM | POA: Diagnosis not present

## 2019-01-31 DIAGNOSIS — J3089 Other allergic rhinitis: Secondary | ICD-10-CM | POA: Diagnosis not present

## 2019-02-02 ENCOUNTER — Telehealth (INDEPENDENT_AMBULATORY_CARE_PROVIDER_SITE_OTHER): Payer: Self-pay | Admitting: *Deleted

## 2019-02-02 ENCOUNTER — Ambulatory Visit (INDEPENDENT_AMBULATORY_CARE_PROVIDER_SITE_OTHER): Payer: Medicare HMO | Admitting: Physical Medicine and Rehabilitation

## 2019-02-02 ENCOUNTER — Ambulatory Visit (INDEPENDENT_AMBULATORY_CARE_PROVIDER_SITE_OTHER): Payer: Self-pay

## 2019-02-02 ENCOUNTER — Encounter (INDEPENDENT_AMBULATORY_CARE_PROVIDER_SITE_OTHER): Payer: Self-pay | Admitting: Physical Medicine and Rehabilitation

## 2019-02-02 VITALS — BP 135/76 | HR 68 | Temp 97.6°F

## 2019-02-02 DIAGNOSIS — M47816 Spondylosis without myelopathy or radiculopathy, lumbar region: Secondary | ICD-10-CM

## 2019-02-02 MED ORDER — METHYLPREDNISOLONE ACETATE 80 MG/ML IJ SUSP
80.0000 mg | Freq: Once | INTRAMUSCULAR | Status: AC
  Administered 2019-02-02: 80 mg

## 2019-02-02 NOTE — Progress Notes (Signed)
  Numeric Pain Rating Scale and Functional Assessment Average Pain 5   In the last MONTH (on 0-10 scale) has pain interfered with the following?  1. General activity like being  able to carry out your everyday physical activities such as walking, climbing stairs, carrying groceries, or moving a chair?  Rating(8)   +Driver, -BT, -Dye Allergies.  

## 2019-02-02 NOTE — Telephone Encounter (Signed)
Service Order:[id number] Authorization [phone] Auth Effective Date:03/05/2020Auth End Date:09/01/2020Initiated Date:03/05/2020Decision Date:03/05/2020Decision Type :InitialCase Status:Approved

## 2019-02-06 DIAGNOSIS — J301 Allergic rhinitis due to pollen: Secondary | ICD-10-CM | POA: Diagnosis not present

## 2019-02-06 DIAGNOSIS — J3089 Other allergic rhinitis: Secondary | ICD-10-CM | POA: Diagnosis not present

## 2019-02-09 ENCOUNTER — Ambulatory Visit: Payer: Self-pay | Admitting: Adult Health

## 2019-02-14 ENCOUNTER — Other Ambulatory Visit: Payer: Self-pay | Admitting: Internal Medicine

## 2019-02-14 MED ORDER — AZITHROMYCIN 250 MG PO TABS: ORAL_TABLET | ORAL | 0 refills | Status: DC

## 2019-02-16 DIAGNOSIS — J301 Allergic rhinitis due to pollen: Secondary | ICD-10-CM | POA: Diagnosis not present

## 2019-02-16 DIAGNOSIS — J3089 Other allergic rhinitis: Secondary | ICD-10-CM | POA: Diagnosis not present

## 2019-02-20 NOTE — Procedures (Signed)
Lumbar Diagnostic Facet Joint Nerve Block with Fluoroscopic Guidance   Patient: Lauren Lopez      Date of Birth: [birth year] MRN: [id number] PCP: Unk Pinto, MD      Visit Date: 02/02/2019   Universal Protocol:    Date/Time: 02/20/2011:43 PM  Consent Given By: the patient  Position: PRONE  Additional Comments: Vital signs were monitored before and after the procedure. Patient was prepped and draped in the usual sterile fashion. The correct patient, procedure, and site was verified.   Injection Procedure Details:  Procedure Site One Meds Administered:  Meds ordered this encounter  Medications  . methylPREDNISolone acetate (DEPO-MEDROL) injection 80 mg     Laterality: Right  Location/Site:  L3-L4 L4-L5  Needle size: 22 ga.  Needle type:spinal  Needle Placement: Oblique pedical  Findings:   -Comments: There was excellent flow of contrast along the articular pillars without intravascular flow.  Procedure Details: The fluoroscope beam is vertically oriented in AP and then obliqued 15 to 20 degrees to the ipsilateral side of the desired nerve to achieve the "Scotty dog" appearance.  The skin over the target area of the junction of the superior articulating process and the transverse process (sacral ala if blocking the L5 dorsal rami) was locally anesthetized with a 1 ml volume of 1% Lidocaine without Epinephrine.  The spinal needle was inserted and advanced in a trajectory view down to the target.   After contact with periosteum and negative aspirate for blood and CSF, correct placement without intravascular or epidural spread was confirmed by injecting 0.5 ml. of Isovue-250.  A spot radiograph was obtained of this image.    Next, a 0.5 ml. volume of the injectate described above was injected. The needle was then redirected to the other facet joint nerves mentioned above if needed.  Prior to the procedure, the patient was given a Pain Diary which was completed  for baseline measurements.  After the procedure, the patient rated their pain every 30 minutes and will continue rating at this frequency for a total of 5 hours.  The patient has been asked to complete the Diary and return to [name] by mail, fax or hand delivered as soon as possible.   Additional Comments:  The patient tolerated the procedure well Dressing: 2 x 2 sterile gauze and Band-Aid    Post-procedure details: Patient was observed during the procedure. Post-procedure instructions were reviewed.  Patient left the clinic in stable condition.

## 2019-02-20 NOTE — Progress Notes (Signed)
Lauren Lopez - 68 y.o. female MRN [id number]  Date of birth: 1951/11/25  Office Visit Note: Visit Date: 02/02/2019 PCP: Unk Pinto, MD Referred by: Unk Pinto, MD  Subjective: Chief Complaint  Patient presents with  . Lower Back - Pain  . Right Thigh - Pain   HPI: Lauren Lopez is a 68 y.o. female who comes in today Returns for evaluation management of her right low back and hip pain.  This was initially felt to be radicular in nature but epidural injection was not very beneficial.  Last injection was facet joint blocks L3-4 and L4-5 which was very beneficial.  She has sacralized L5 segment without much in the way of arthritis.  We will repeat facet joint block at L3-4 and L4-5 on the right.  She got considerable relief for about 3 weeks total we did use some steroid medication in the block.  We will repeat this and depending on relief would look at regrouping with physical therapy or chiropractic care and then radiofrequency ablation depending on results.  ROS Otherwise per HPI.  Assessment & Plan: Visit Diagnoses:  1. Spondylosis without myelopathy or radiculopathy, lumbar region     Plan: No additional findings.   Meds & Orders:  Meds ordered this encounter  Medications  . methylPREDNISolone acetate (DEPO-MEDROL) injection 80 mg    Orders Placed This Encounter  Procedures  . Facet Injection  . XR C-ARM NO REPORT    Follow-up: Return if symptoms worsen or fail to improve.   Procedures: No procedures performed  Lumbar Diagnostic Facet Joint Nerve Block with Fluoroscopic Guidance   Patient: Lauren Lopez      Date of Birth: October 25, 1951 MRN: [id number] PCP: Unk Pinto, MD      Visit Date: 02/02/2019   Universal Protocol:    Date/Time: 02/20/2011:43 PM  Consent Given By: the patient  Position: PRONE  Additional Comments: Vital signs were monitored before and after the procedure. Patient was prepped and draped in the usual sterile  fashion. The correct patient, procedure, and site was verified.   Injection Procedure Details:  Procedure Site One Meds Administered:  Meds ordered this encounter  Medications  . methylPREDNISolone acetate (DEPO-MEDROL) injection 80 mg     Laterality: Right  Location/Site:  L3-L4 L4-L5  Needle size: 22 ga.  Needle type:spinal  Needle Placement: Oblique pedical  Findings:   -Comments: There was excellent flow of contrast along the articular pillars without intravascular flow.  Procedure Details: The fluoroscope beam is vertically oriented in AP and then obliqued 15 to 20 degrees to the ipsilateral side of the desired nerve to achieve the "Scotty dog" appearance.  The skin over the target area of the junction of the superior articulating process and the transverse process (sacral ala if blocking the L5 dorsal rami) was locally anesthetized with a 1 ml volume of 1% Lidocaine without Epinephrine.  The spinal needle was inserted and advanced in a trajectory view down to the target.   After contact with periosteum and negative aspirate for blood and CSF, correct placement without intravascular or epidural spread was confirmed by injecting 0.5 ml. of Isovue-250.  A spot radiograph was obtained of this image.    Next, a 0.5 ml. volume of the injectate described above was injected. The needle was then redirected to the other facet joint nerves mentioned above if needed.  Prior to the procedure, the patient was given a Pain Diary which was completed for baseline measurements.  After the procedure,  the patient rated their pain every 30 minutes and will continue rating at this frequency for a total of 5 hours.  The patient has been asked to complete the Diary and return to [name] by mail, fax or hand delivered as soon as possible.   Additional Comments:  The patient tolerated the procedure well Dressing: 2 x 2 sterile gauze and Band-Aid    Post-procedure details: Patient was observed  during the procedure. Post-procedure instructions were reviewed.  Patient left the clinic in stable condition.   Clinical History: MRI LUMBAR SPINE WITHOUT CONTRAST  TECHNIQUE: Multiplanar, multisequence MR imaging of the lumbar spine was performed. No intravenous contrast was administered.  COMPARISON:  Lumbar radiographs 02/24/2018 and earlier. CT Abdomen and Pelvis 10/20/2010. chest CTA 04/03/2016.  FINDINGS: Segmentation: 12 pairs of ribs including the lowest which are hypoplastic demonstrated on the 2017 CTA. Subsequently there are 4 lumbar type vertebral bodies. Therefore, the L5 level is fully sacralized with a vestigial L5-S1 disc space. Correlation with radiographs is recommended prior to any operative intervention.  Alignment: Chronic grade 1 anterolisthesis of L3 on L4 measuring 3-4 millimeters appears increased since 2011. Stable vertebral height and alignment elsewhere with mild straightening of lumbar lordosis.  Vertebrae: No marrow edema or evidence of acute osseous abnormality. Visualized bone marrow signal is within normal limits. Intact visible sacrum and SI joints.  Conus medullaris and cauda equina: Conus extends to the L1 level. No lower spinal cord or conus signal abnormality.  Paraspinal and other soft tissues: Stable since 2011 and negative visible abdominal viscera. Negative visualized posterior paraspinal soft tissues.  Disc levels:  T10-T11: Negative.  T11-T12: Negative.  T12-L1: Mild disc bulging with cephalad extruded disc fragment occupying the left ventral epidural space posterior to the T12 vertebral body (series 8, image 8, series 11, image 4. The extruded disc fragment encompasses 4 x 10 x 10 millimeters (AP by transverse by CC). This is in proximity to the descending left T12 nerve roots in the lateral recess. No significant spinal or foraminal stenosis.  L1-L2: Minimal far lateral disc bulging and facet hypertrophy. No  stenosis.  L2-L3: Mild far lateral disc bulging and endplate spurring. Moderate facet hypertrophy. No significant stenosis.  L3-L4: Grade 1 anterolisthesis. Disc desiccation and mild disc space loss with circumferential but mostly foraminal and far lateral disc bulging. Moderate to severe facet and ligament flavum hypertrophy. Mild spinal stenosis with mild bilateral lateral recess stenosis (L4 nerve levels), and mild to moderate bilateral L3 neural foraminal stenosis.  L4-L5: Vacuum disc. Circumferential disc bulge with broad-based foraminal involvement. Moderate facet and ligament flavum hypertrophy. No spinal or lateral recess stenosis. Mild to moderate bilateral L4 foraminal stenosis appears greater on the right.  L5-S1:  Sacralized and negative.  IMPRESSION: 1. 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 Correlation with radiographs is recommended prior to any operative intervention. 2. Grade 1 spondylolisthesis at L3-L4 is chronic but progressed since 2011. There is multifactorial mild spinal stenosis with mild lateral recess and up to moderate neural foraminal stenosis. Query right L3 and/or L4 radiculitis. 3. Up to moderate multifactorial neural foraminal stenosis also at L4-L5; the exiting L4 nerve levels. 4. There is a small left-side extruded disc fragment posterior to the T12 vertebral body. No significant spinal stenosis, this could be a source for left T12 radiculitis. 5. Mild for age lumbar spine degeneration elsewhere.   Electronically Signed   By: Genevie Ann M.D.   On: 06/21/2018 09:11   She reports that she  has never smoked. She has never used smokeless tobacco.  Recent Labs    05/31/18 1113 09/15/18 1611 12/22/18 1210  HGBA1C 7.0* 6.5* 6.4*    Objective:  VS:  HT:    WT:   BMI:     BP:135/76  HR:68bpm  TEMP:97.6 F (36.4 C)(Oral)  RESP:  Physical Exam  Ortho Exam Imaging: No results found.  Past  Medical/Family/Surgical/Social History: Medications & Allergies reviewed per EMR, new medications updated. Patient Active Problem List   Diagnosis Date Noted  . Obesity (BMI 30.0-34.9) 05/30/2018  . Osteoporosis 06/01/2017  . Back pain 04/05/2016  . Medication management 01/29/2015  . T2_NIDDM 10/10/2014  . GERD (gastroesophageal reflux disease)   . Mixed hyperlipidemia   . Vitamin D deficiency   . Asthma   . Essential hypertension 08/09/2009   Past Medical History:  Diagnosis Date  . Anemia   . Arthritis    "right hand, back" (04/03/2016)  . Asthma   . Bilateral carpal tunnel syndrome   . Chronic lower back pain   . GERD (gastroesophageal reflux disease)   . Hyperlipidemia   . Hypertension   . Multiple allergies   . Pneumonia 03/2012  . Positive PPD    "they told me it wasn't positive; I was allergic to the test itself"  . Type II diabetes mellitus ([place])   . Vitamin deficiency   . Wears dentures    upper  . Wears glasses    Family History  Problem Relation Age of Onset  . Hypertension Mother   . Diabetes Mother   . Stroke Mother   . Hypertension Father   . Heart disease Brother   . Hyperlipidemia Brother   . Diabetes Brother    Past Surgical History:  Procedure Laterality Date  . ABDOMINAL HYSTERECTOMY    . BILATERAL SALPINGOOPHORECTOMY  11/28/2010   open laparoscopy with adhesiolysis also  . CARPAL TUNNEL RELEASE  07/15/2012   Procedure: CARPAL TUNNEL RELEASE;  Surgeon: Cammie Sickle., MD;  Location: [facility];  Service: Orthopedics;  Laterality: Left;  . CARPAL TUNNEL RELEASE Right 05/31/2014   Procedure: RIGHT CARPAL TUNNEL RELEASE;  Surgeon: Cammie Sickle, MD;  Location: [facility];  Service: Orthopedics;  Laterality: Right;  . COLONOSCOPY    . KNEE ARTHROSCOPY Bilateral 2005-2016   "right-left"  . SHOULDER ARTHROSCOPY WITH ROTATOR CUFF REPAIR AND SUBACROMIAL DECOMPRESSION Right 01/20/2013   Procedure: RIGHT ARTHROSCOPY  SHOULDER DEBRIDEMENT LIMITED, ARTHROSCOPY SHOULDER DECOMPRESSION SUBACROMIAL PARTIAL ACROMIOPLASTY WITH CORACOACROMIAL RELEASE, ROTATOR CUFF REPAIR ;  Surgeon: Johnny Bridge, MD;  Location: [facility];  Service: Orthopedics;  Laterality: Right;  RIGHT SHOULDER SCOPE DEBRIDEMENT, ACRIOMIOPLASTY, ROTATOR CUFF REPAIR  . TUBAL LIGATION     Social History   Occupational History  . Not on file  Tobacco Use  . Smoking status: Never Smoker  . Smokeless tobacco: Never Used  Substance and Sexual Activity  . Alcohol use: No  . Drug use: No  . Sexual activity: Not Currently

## 2019-02-23 ENCOUNTER — Other Ambulatory Visit: Payer: Self-pay | Admitting: Internal Medicine

## 2019-02-23 DIAGNOSIS — M545 Low back pain, unspecified: Secondary | ICD-10-CM

## 2019-02-23 DIAGNOSIS — J301 Allergic rhinitis due to pollen: Secondary | ICD-10-CM | POA: Diagnosis not present

## 2019-02-23 DIAGNOSIS — J3089 Other allergic rhinitis: Secondary | ICD-10-CM | POA: Diagnosis not present

## 2019-03-01 DIAGNOSIS — J3089 Other allergic rhinitis: Secondary | ICD-10-CM | POA: Diagnosis not present

## 2019-03-01 DIAGNOSIS — J301 Allergic rhinitis due to pollen: Secondary | ICD-10-CM | POA: Diagnosis not present

## 2019-03-05 ENCOUNTER — Other Ambulatory Visit: Payer: Self-pay | Admitting: Internal Medicine

## 2019-03-08 DIAGNOSIS — J3089 Other allergic rhinitis: Secondary | ICD-10-CM | POA: Diagnosis not present

## 2019-03-08 DIAGNOSIS — J301 Allergic rhinitis due to pollen: Secondary | ICD-10-CM | POA: Diagnosis not present

## 2019-03-15 DIAGNOSIS — J3089 Other allergic rhinitis: Secondary | ICD-10-CM | POA: Diagnosis not present

## 2019-03-15 DIAGNOSIS — J301 Allergic rhinitis due to pollen: Secondary | ICD-10-CM | POA: Diagnosis not present

## 2019-03-22 DIAGNOSIS — J3089 Other allergic rhinitis: Secondary | ICD-10-CM | POA: Diagnosis not present

## 2019-03-22 DIAGNOSIS — J301 Allergic rhinitis due to pollen: Secondary | ICD-10-CM | POA: Diagnosis not present

## 2019-03-29 DIAGNOSIS — J3089 Other allergic rhinitis: Secondary | ICD-10-CM | POA: Diagnosis not present

## 2019-03-29 DIAGNOSIS — J301 Allergic rhinitis due to pollen: Secondary | ICD-10-CM | POA: Diagnosis not present

## 2019-04-02 ENCOUNTER — Other Ambulatory Visit: Payer: Self-pay | Admitting: Internal Medicine

## 2019-04-02 DIAGNOSIS — M810 Age-related osteoporosis without current pathological fracture: Secondary | ICD-10-CM

## 2019-04-04 DIAGNOSIS — J3089 Other allergic rhinitis: Secondary | ICD-10-CM | POA: Diagnosis not present

## 2019-04-04 DIAGNOSIS — J301 Allergic rhinitis due to pollen: Secondary | ICD-10-CM | POA: Diagnosis not present

## 2019-04-10 NOTE — Progress Notes (Deleted)
MEDICARE WELLNESS Assessment:   Essential hypertension - continue medications, DASH diet, exercise and monitor at home. Call if greater than 130/80.  -     CBC with Differential/Platelet -     COMPLETE METABOLIC PANEL WITH GFR -     TSH  Type 2 diabetes mellitus without complication, without long-term current use of insulin ([place]) Discussed general issues about diabetes pathophysiology and management., Educational material distributed., Suggested low cholesterol diet., Encouraged aerobic exercise., Discussed foot care., Reminded to get yearly retinal exam. -     Hemoglobin A1c  Mixed hyperlipidemia check lipids decrease fatty foods increase activity. -     Lipid panel  Medication management -     Magnesium  Vitamin D deficiency -     VITAMIN D 25 Hydroxy (Vit-D Deficiency, Fractures)  Age related osteoporosis, unspecified pathological fracture presence - on fosamax x 06/2017  Obesity (BMI 30.0-34.9) - follow up 3 months for progress monitoring - increase veggies, decrease carbs - long discussion about weight loss, diet, and exercise  Right-sided low back pain without sciatica, unspecified chronicity - continue ortho follow up  Gastroesophageal reflux disease, esophagitis presence not specified Continue PPI/H2 blocker, diet discussed  Uncomplicated asthma, unspecified asthma severity, unspecified whether persistent Monitor  Hot flashes -     escitalopram (LEXAPRO) 10 MG tablet; Take 1 tablet (10 mg total) by mouth daily. - if this does not help will try paxil 1m   Over 30 minutes of exam, counseling, chart review, and critical decision making was performed  Future Appointments  Date Time Provider DMaramec 04/12/2019 11:15 AM CVicie Mutters PA-C [facility] None  12/28/2019 10:00 AM CVicie Mutters PA-C [facility] None     Subjective:  GEMAGENE MERFELDis a 68y.o. AA female who presents for medicare wellness and 3 month follow up for HTN,  hyperlipidemia, prediabetes, and vitamin D Def.   Stopped the lexapro due to joint pain and teeth grinding.   She has been following with ortho for her back pain, had an injection but states it did not help, she is following up with them. She has been taking the mobic 2 x a day and the flexeril. Worse right leg pain, worse at night trying to move around, has been taking move free supplements that are helping.    Her blood pressure has been controlled at home, today their BP is   She does workout, [facility], she is lifting weights and walking.  She denies chest pain, shortness of breath, dizziness.   Husband has leukemia, they are monitoring it, following up in March. He is having sweating and fatigue at night, he is having dementia as well.   She also has hot flashes.   She is on cholesterol medication and denies myalgias. Her cholesterol is at goal. The cholesterol last visit was:   Lab Results  Component Value Date   CHOL 219 (H) 12/22/2018   HDL 83 12/22/2018   LDLCALC 121 (H) 12/22/2018   TRIG 56 12/22/2018   CHOLHDL 2.6 12/22/2018   She has been working on diet and exercise for diabetes with CKD, controlled with diet and metformin, she has worked hard to get it down, and denies foot ulcerations, hyperglycemia, hypoglycemia , increased appetite, nausea, paresthesia of the feet, polydipsia, polyuria, vomiting and weight loss. Last A1C in the office was:  Lab Results  Component Value Date   HGBA1C 6.4 (H) 12/22/2018   Last  GFR Lab Results  Component Value Date   GFRNONAA 67 12/22/2018   She is on fosamax for osteporosis x 06/2017. Started on calcium which helped the right leg pain.   Patient is on Vitamin D supplement, 5000 IU 2 a day but now at one a day.  Lab Results  Component Value Date   VD25OH 46 12/22/2018   BMI is There is no height or weight on file to calculate BMI., she is working on diet and exercise. Wt  Readings from Last 3 Encounters:  12/22/18 200 lb (90.7 kg)  12/15/18 200 lb (90.7 kg)  09/15/18 200 lb 12.8 oz (91.1 kg)    Medication Review: Current Outpatient Medications on File Prior to Visit  Medication Sig Dispense Refill  . alendronate (FOSAMAX) 70 MG tablet Take 1 tablet on an Empty Stomach with a full glass of water - every 7 days for Osteoporosis 12 tablet 3  . Apoaequorin (PREVAGEN PO) Take by mouth.    Marland Kitchen azithromycin (ZITHROMAX) 250 MG tablet Take 2 tablets (500 mg) on  Day 1,  followed by 1 tablet (250 mg) once daily on Days 2 through 5. 6 each 0  . blood glucose meter kit and supplies Test sugars twice daily. Dx E11.9 1 each 0  . calcium carbonate (CALCIUM 600) 600 MG TABS tablet Take 600 mg by mouth daily.    . Cholecalciferol (VITAMIN D3) 5000 UNITS TABS Take 5,000 Units by mouth daily.    . cyanocobalamin 100 MCG tablet Take 100 mcg by mouth daily.    . cyclobenzaprine (FLEXERIL) 10 MG tablet Take 1 tablet (10 mg total) by mouth at bedtime as needed for muscle spasms. 90 tablet 0  . escitalopram (LEXAPRO) 10 MG tablet Take 1 tablet (10 mg total) by mouth daily. 90 tablet 1  . fexofenadine (ALLEGRA) 180 MG tablet Take 1 tablet (180 mg total) by mouth daily. 90 tablet 6  . fluticasone-salmeterol (ADVAIR HFA) 115-21 MCG/ACT inhaler Inhale 2 puffs into the lungs 2 (two) times daily. 1 Inhaler 6  . Glucosamine-Chondroitin (MOVE FREE PO) Take by mouth.    . hydrochlorothiazide (HYDRODIURIL) 25 MG tablet TAKE 1 TABLET BY MOUTH EVERY DAY 90 tablet 2  . MAGNESIUM PO Take 500 mg by mouth daily.     . meloxicam (MOBIC) 15 MG tablet TAKE 1/2 TO 1 TABLET DAILY WITH FOOD FOR PAIN & INFLAMMATION AND LIMIT TO 5 DAYS/WEEK TO AVOID KIDNEY DAMAGE 90 tablet 1  . metFORMIN (GLUCOPHAGE-XR) 500 MG 24 hr tablet TAKE 1 TAB DAILY WITH LARGEST MEAL OF THE DAY. 90 tablet 1  . omeprazole (PRILOSEC) 40 MG capsule TAKE 1 CAPSULE BY MOUTH EVERY DAY 90 capsule 0  . OVER THE COUNTER MEDICATION     . Red  Yeast Rice Extract (RED YEAST RICE PO) Take 2,000 mg by mouth daily.     No current facility-administered medications on file prior to visit.     Allergies: Allergies  Allergen Reactions  . Metronidazole Other (See Comments)    unknown  . Pneumovax 23 [Pneumococcal Vac Polyvalent]     Redness and severe swelling at injection site  . Ppd [Tuberculin Purified Protein Derivative] Other (See Comments)    + PPD 2013 with NEG CXR 05/2012    Current Problems (verified) has Essential hypertension; GERD (gastroesophageal reflux disease); Mixed hyperlipidemia; Vitamin D deficiency; Asthma; T2_NIDDM; Medication management; Back pain; Osteoporosis; and Obesity (BMI 30.0-34.9) on their problem list.  Screening Tests Immunization History  Administered Date(s) Administered  .  Influenza, High Dose Seasonal PF 11/10/2017, 09/15/2018  . Influenza, Seasonal, Injecte, Preservative Fre 10/17/2015  . Influenza,inj,quad, With Preservative 10/15/2016  . Pneumococcal Conjugate-13 12/22/2018  . Pneumococcal Polysaccharide-23 02/04/2017  . Pneumococcal-Unspecified 06/27/2013  . Tdap 04/23/2012  . Zoster 07/31/2015    Preventative care: Last colonoscopy: 2019 had 6 polyps Pap: Hysterectomy Mammogram:  05/2018 DEXA: 05/25/2017 due 2 years started fosamax 06/2017 CXR 03/2016  Names of Other Physician/Practitioners you currently use: 1. [facility] here for primary care 2. Dr. Katy Fitch, eye doctor, last visit 09/30/2017 3. Dr. Mariea Clonts  , dentist, last visit 2018 Patient Care Team: Unk Pinto, MD as PCP - General (Internal Medicine) Sypher, Herbie Baltimore, MD (Inactive) as Consulting Physician (Orthopedic Surgery) Monna Fam, MD as Consulting Physician (Ophthalmology) Richmond Campbell, MD as Consulting Physician (Gastroenterology) Cheri Fowler, MD as Consulting Physician (Obstetrics and Gynecology) Gaynelle Arabian, MD as Consulting Physician (Orthopedic  Surgery) Marchia Bond, MD as Consulting Physician (Orthopedic Surgery) Mosetta Anis, MD as Referring Physician (Allergy) Larey Dresser, MD as Consulting Physician (Cardiology)  Surgical: She  has a past surgical history that includes Bilateral salpingoophorectomy (11/28/2010); Knee arthroscopy (Bilateral, 2005-2016); Colonoscopy; Carpal tunnel release (07/15/2012); Shoulder arthroscopy with rotator cuff repair and subacromial decompression (Right, 01/20/2013); Abdominal hysterectomy; Carpal tunnel release (Right, 05/31/2014); and Tubal ligation. Family Her family history includes Diabetes in her brother and mother; Heart disease in her brother; Hyperlipidemia in her brother; Hypertension in her father and mother; Stroke in her mother. Social history  She reports that she has never smoked. She has never used smokeless tobacco. She reports that she does not drink alcohol or use drugs.  MEDICARE WELLNESS OBJECTIVES: Physical activity:   Cardiac risk factors:   Depression/mood screen:   Depression screen [facility] 2/9 02/04/2018  Decreased Interest 0  Down, Depressed, Hopeless 0  PHQ - 2 Score 0    ADLs:  No flowsheet data found.   Cognitive Testing  Alert? Yes  Normal Appearance?Yes  Oriented to person? Yes  Place? Yes   Time? Yes  Recall of three objects?  Yes  Can perform simple calculations? Yes  Displays appropriate judgment?Yes  Can read the correct time from a watch face?Yes  EOL planning:      Objective:   There were no vitals filed for this visit. There is no height or weight on file to calculate BMI.  General appearance: alert, no distress, WD/WN, female HEENT: normocephalic, sclerae anicteric, TMs pearly, nares patent, no discharge or erythema, pharynx normal Oral cavity: MMM, no lesions Neck: supple, no lymphadenopathy, no thyromegaly, no masses Heart: RRR, normal S1, S2, no murmurs Lungs: CTA bilaterally, no wheezes, rhonchi, or rales Abdomen: +bs, soft, non  tender, non distended, no masses, no hepatomegaly, no splenomegaly Musculoskeletal: nontender, no swelling, no obvious deformity, Patient is able to ambulate well. Gait is  Antalgic. Straight leg raising with dorsiflexion negative bilaterally for radicular symptoms. Sensory exam in the legs are normal. Knee reflexes are normal Ankle reflexes are normal Strength is normal and symmetric in arms and legs. There is not SI tenderness to palpation.  There is paraspinal muscle spasm.  There is not midline tenderness.  ROM of spine with  limited in all spheres due to pain.  Extremities: no edema, no cyanosis, no clubbing Pulses: 2+ symmetric, upper and lower extremities, normal cap refill Neurological: alert, oriented x 3, CN2-12 intact, strength normal upper extremities and lower extremities, sensation normal throughout, DTRs 2+ throughout, no cerebellar signs, gait normal Psychiatric: normal affect,  behavior normal, pleasant    Medicare Attestation I have personally reviewed: The patient's medical and social history Their use of alcohol, tobacco or illicit drugs Their current medications and supplements The patient's functional ability including ADLs,fall risks, home safety risks, cognitive, and hearing and visual impairment Diet and physical activities Evidence for depression or mood disorders  The patient's weight, height, BMI, and visual acuity have been recorded in the chart.  I have made referrals, counseling, and provided education to the patient based on review of the above and I have provided the patient with a written personalized care plan for preventive services.      Vicie Mutters, PA-C   04/10/2019

## 2019-04-11 DIAGNOSIS — R69 Illness, unspecified: Secondary | ICD-10-CM | POA: Diagnosis not present

## 2019-04-12 ENCOUNTER — Ambulatory Visit: Payer: Self-pay | Admitting: Physician Assistant

## 2019-04-13 DIAGNOSIS — J301 Allergic rhinitis due to pollen: Secondary | ICD-10-CM | POA: Diagnosis not present

## 2019-04-13 DIAGNOSIS — J3089 Other allergic rhinitis: Secondary | ICD-10-CM | POA: Diagnosis not present

## 2019-04-21 ENCOUNTER — Other Ambulatory Visit: Payer: Self-pay | Admitting: Physician Assistant

## 2019-04-21 DIAGNOSIS — R69 Illness, unspecified: Secondary | ICD-10-CM | POA: Diagnosis not present

## 2019-04-21 DIAGNOSIS — H1045 Other chronic allergic conjunctivitis: Secondary | ICD-10-CM | POA: Diagnosis not present

## 2019-04-21 DIAGNOSIS — J301 Allergic rhinitis due to pollen: Secondary | ICD-10-CM | POA: Diagnosis not present

## 2019-04-21 DIAGNOSIS — J3089 Other allergic rhinitis: Secondary | ICD-10-CM | POA: Diagnosis not present

## 2019-04-21 DIAGNOSIS — J4530 Mild persistent asthma, uncomplicated: Secondary | ICD-10-CM | POA: Diagnosis not present

## 2019-04-25 DIAGNOSIS — R69 Illness, unspecified: Secondary | ICD-10-CM | POA: Diagnosis not present

## 2019-04-25 NOTE — Progress Notes (Addendum)
MEDICARE WELLNESS Assessment:   Essential hypertension - continue medications, DASH diet, exercise and monitor at home. Call if greater than 130/80.  -     CBC with Differential/Platelet -     COMPLETE METABOLIC PANEL WITH GFR -     TSH   Type 2 diabetes mellitus without complication, without long-term current use of insulin ([place]) Discussed general issues about diabetes pathophysiology and management., Educational material distributed., Suggested low cholesterol diet., Encouraged aerobic exercise., Discussed foot care., Reminded to get yearly retinal exam. -     Hemoglobin A1c  Mixed hyperlipidemia check lipids decrease fatty foods increase activity. -     Lipid panel  Medication management -     Magnesium  Vitamin D deficiency -     VITAMIN D 25 Hydroxy (Vit-D Deficiency, Fractures)  Age related osteoporosis, unspecified pathological fracture presence - on fosamax x 06/2017  Morbid Obesity - follow up 3 months for progress monitoring - increase veggies, decrease carbs - long discussion about weight loss, diet, and exercise  Right-sided low back pain without sciatica, unspecified chronicity - continue ortho follow up  Gastroesophageal reflux disease, esophagitis presence not specified Continue PPI/H2 blocker, diet discussed  Uncomplicated asthma, unspecified asthma severity, unspecified whether persistent Monitor   Over 30 minutes of exam, counseling, chart review, and critical decision making was performed  Future Appointments  Date Time Provider [place]  12/28/2019 10:00 AM Vicie Mutters, PA-C [id number] None     Subjective:  Lauren Lopez is a 68 y.o. AA female who presents for medicare wellness and 3 month follow up for HTN, hyperlipidemia, prediabetes, and vitamin D Def.   She has been following with ortho for her back pain, had an injection but states it did not help, she is following up with them. She states when she first gets up she is  very stiff for 20 mins but better after moving.  She had negative PTH. She had negative RA workup in 2017.   Her blood pressure has been controlled at home, today their BP is BP: 130/76 She does workout.  She denies chest pain, shortness of breath, dizziness.   Husband has leukemia, they are monitoring it, he is having dementia as well.   She also has hot flashes, could not tolerate the lexapro.   She is on cholesterol medication and denies myalgias. Her cholesterol is at goal. The cholesterol last visit was:   Lab Results  Component Value Date   CHOL 219 (H) 12/22/2018   HDL 83 12/22/2018   LDLCALC 121 (H) 12/22/2018   TRIG 56 12/22/2018   CHOLHDL 2.6 12/22/2018   She has been working on diet and exercise for diabetes with CKD, controlled with diet and metformin, she has worked hard to get it down, and denies foot ulcerations, hyperglycemia, hypoglycemia , increased appetite, nausea, paresthesia of the feet, polydipsia, polyuria, vomiting and weight loss. Last A1C in the office was:  Lab Results  Component Value Date   HGBA1C 6.4 (H) 12/22/2018   Last GFR Lab Results  Component Value Date   GFRNONAA 67 12/22/2018   She is on fosamax for osteporosis x 06/2017. Started on calcium which helped the right leg pain.   Patient is on Vitamin D supplement, 5000 IU 2 a day but now at one a day.  Lab Results  Component Value Date   VD25OH 46 12/22/2018   BMI is Body mass index is 35.43 kg/m., she is working on diet and exercise. Wt Readings from Last  3 Encounters:  04/26/19 200 lb (90.7 kg)  12/22/18 200 lb (90.7 kg)  12/15/18 200 lb (90.7 kg)    Medication Review: Current Outpatient Medications on File Prior to Visit  Medication Sig Dispense Refill  . alendronate (FOSAMAX) 70 MG tablet Take 1 tablet on an Empty Stomach with a full glass of water - every 7 days for Osteoporosis 12 tablet 3  . Apoaequorin (PREVAGEN PO) Take by mouth.    . blood glucose meter kit and supplies Test  sugars twice daily. Dx E11.9 1 each 0  . calcium carbonate (CALCIUM 600) 600 MG TABS tablet Take 600 mg by mouth daily.    . Cholecalciferol (VITAMIN D3) 5000 UNITS TABS Take 5,000 Units by mouth daily.    . cyanocobalamin 100 MCG tablet Take 100 mcg by mouth daily.    . cyclobenzaprine (FLEXERIL) 10 MG tablet Take 1 tablet (10 mg total) by mouth at bedtime as needed for muscle spasms. 90 tablet 0  . escitalopram (LEXAPRO) 10 MG tablet Take 1 tablet (10 mg total) by mouth daily. 90 tablet 1  . fexofenadine (ALLEGRA) 180 MG tablet Take 1 tablet (180 mg total) by mouth daily. 90 tablet 6  . fluticasone-salmeterol (ADVAIR HFA) 115-21 MCG/ACT inhaler Inhale 2 puffs into the lungs 2 (two) times daily. 1 Inhaler 6  . Glucosamine-Chondroitin (MOVE FREE PO) Take by mouth.    . hydrochlorothiazide (HYDRODIURIL) 25 MG tablet TAKE 1 TABLET BY MOUTH EVERY DAY 90 tablet 2  . MAGNESIUM PO Take 500 mg by mouth daily.     . meloxicam (MOBIC) 15 MG tablet TAKE 1/2 TO 1 TABLET DAILY WITH FOOD FOR PAIN & INFLAMMATION AND LIMIT TO 5 DAYS/WEEK TO AVOID KIDNEY DAMAGE 90 tablet 1  . metFORMIN (GLUCOPHAGE-XR) 500 MG 24 hr tablet TAKE 1 TAB DAILY WITH LARGEST MEAL OF THE DAY. 90 tablet 1  . omeprazole (PRILOSEC) 40 MG capsule TAKE 1 CAPSULE BY MOUTH EVERY DAY 90 capsule 0  . ONE TOUCH ULTRA TEST test strip TEST SUGAR TWICE A DAY 100 each 1  . OVER THE COUNTER MEDICATION     . Red Yeast Rice Extract (RED YEAST RICE PO) Take 2,000 mg by mouth daily.     No current facility-administered medications on file prior to visit.     Allergies: Allergies  Allergen Reactions  . Metronidazole Other (See Comments)    unknown  . Pneumovax 23 [Pneumococcal Vac Polyvalent]     Redness and severe swelling at injection site  . Ppd [Tuberculin Purified Protein Derivative] Other (See Comments)    + PPD 2013 with NEG CXR 05/2012    Current Problems (verified) has Essential hypertension; GERD (gastroesophageal reflux disease);  Mixed hyperlipidemia; Vitamin D deficiency; Asthma; T2_NIDDM; Medication management; Back pain; Osteoporosis; and Obesity (BMI 30.0-34.9) on their problem list.  Screening Tests Immunization History  Administered Date(s) Administered  . Influenza, High Dose Seasonal PF 11/10/2017, 09/15/2018  . Influenza, Seasonal, Injecte, Preservative Fre 10/17/2015  . Influenza,inj,quad, With Preservative 10/15/2016  . Pneumococcal Conjugate-13 12/22/2018  . Pneumococcal Polysaccharide-23 02/04/2017  . Pneumococcal-Unspecified 06/27/2013  . Tdap 04/23/2012  . Zoster 07/31/2015    Preventative care: Last colonoscopy: 2019 had 6 polyps Pap: Hysterectomy Mammogram:  05/2018 goes to [facility] DEXA: 05/25/2017 due 2 years started fosamax 06/2017 CXR 03/2016  Names of Other Physician/Practitioners you currently use: 1. [facility] here for primary care 2. Dr. Katy Fitch, eye doctor, last visit 2019 DUE this year, will see in August  3. Dr. Mariea Clonts  , dentist, last visit 2018 Patient Care Team: Unk Pinto, MD as PCP - General (Internal Medicine) Sypher, Herbie Baltimore, MD (Inactive) as Consulting Physician (Orthopedic Surgery) Monna Fam, MD as Consulting Physician (Ophthalmology) Richmond Campbell, MD as Consulting Physician (Gastroenterology) Cheri Fowler, MD as Consulting Physician (Obstetrics and Gynecology) Gaynelle Arabian, MD as Consulting Physician (Orthopedic Surgery) Marchia Bond, MD as Consulting Physician (Orthopedic Surgery) Mosetta Anis, MD as Referring Physician (Allergy) Larey Dresser, MD as Consulting Physician (Cardiology)  Surgical: She  has a past surgical history that includes Bilateral salpingoophorectomy (11/28/2010); Knee arthroscopy (Bilateral, 2005-2016); Colonoscopy; Carpal tunnel release (07/15/2012); Shoulder arthroscopy with rotator cuff repair and subacromial decompression (Right, 01/20/2013); Abdominal hysterectomy; Carpal tunnel  release (Right, 05/31/2014); and Tubal ligation. Family Her family history includes Diabetes in her brother and mother; Heart disease in her brother; Hyperlipidemia in her brother; Hypertension in her father and mother; Stroke in her mother. Social history  She reports that she has never smoked. She has never used smokeless tobacco. She reports that she does not drink alcohol or use drugs.  MEDICARE WELLNESS OBJECTIVES: Physical activity:   Cardiac risk factors:   Depression/mood screen:   Depression screen [facility] 2/9 02/04/2018  Decreased Interest 0  Down, Depressed, Hopeless 0  PHQ - 2 Score 0    ADLs:  No flowsheet data found.   Cognitive Testing  Alert? Yes  Normal Appearance?Yes  Oriented to person? Yes  Place? Yes   Time? Yes  Recall of three objects?  Yes  Can perform simple calculations? Yes  Displays appropriate judgment?Yes  Can read the correct time from a watch face?Yes  EOL planning: Does Patient Have a Medical Advance Directive?: No Would patient like information on creating a medical advance directive?: Yes (MAU/Ambulatory/Procedural Areas - Information given)    Objective:   Today's Vitals   04/26/19 1538  BP: 130/76  Pulse: 66  Temp: (!) 97.3 F (36.3 C)  SpO2: 98%  Weight: 200 lb (90.7 kg)   Body mass index is 35.43 kg/m.  General appearance: alert, no distress, WD/WN, female HEENT: normocephalic, sclerae anicteric, TMs pearly, nares patent, no discharge or erythema, pharynx normal Oral cavity: MMM, no lesions Neck: supple, no lymphadenopathy, no thyromegaly, no masses Heart: RRR, normal S1, S2, no murmurs Lungs: CTA bilaterally, no wheezes, rhonchi, or rales Abdomen: +bs, soft, non tender, non distended, no masses, no hepatomegaly, no splenomegaly Musculoskeletal: nontender, no swelling, no obvious deformity, Patient is able to ambulate well. Gait is  Antalgic. Straight leg raising with dorsiflexion negative bilaterally for radicular symptoms. Sensory  exam in the legs are normal. Knee reflexes are normal Ankle reflexes are normal Strength is normal and symmetric in arms and legs. There is not SI tenderness to palpation.  There is paraspinal muscle spasm.  There is not midline tenderness.  ROM of spine with  limited in all spheres due to pain.  Extremities: no edema, no cyanosis, no clubbing Pulses: 2+ symmetric, upper and lower extremities, normal cap refill Neurological: alert, oriented x 3, CN2-12 intact, strength normal upper extremities and lower extremities, sensation normal throughout, DTRs 2+ throughout, no cerebellar signs, gait normal Psychiatric: normal affect, behavior normal, pleasant   Diabetic Foot Exam - Simple   Simple Foot Form Diabetic Foot exam was performed with the following findings:  Yes 04/26/2019  5:04 PM  Visual Inspection No deformities, no ulcerations, no other skin breakdown bilaterally:  Yes Sensation Testing Intact to touch and monofilament testing bilaterally:  Yes Pulse Check  Posterior Tibialis and Dorsalis pulse intact bilaterally:  Yes Comments      Medicare Attestation I have personally reviewed: The patient's medical and social history Their use of alcohol, tobacco or illicit drugs Their current medications and supplements The patient's functional ability including ADLs,fall risks, home safety risks, cognitive, and hearing and visual impairment Diet and physical activities Evidence for depression or mood disorders  The patient's weight, height, BMI, and visual acuity have been recorded in the chart.  I have made referrals, counseling, and provided education to the patient based on review of the above and I have provided the patient with a written personalized care plan for preventive services.      Vicie Mutters, PA-C   04/26/2019

## 2019-04-26 ENCOUNTER — Other Ambulatory Visit: Payer: Self-pay

## 2019-04-26 ENCOUNTER — Ambulatory Visit (INDEPENDENT_AMBULATORY_CARE_PROVIDER_SITE_OTHER): Payer: Medicare HMO | Admitting: Physician Assistant

## 2019-04-26 ENCOUNTER — Encounter: Payer: Self-pay | Admitting: Physician Assistant

## 2019-04-26 VITALS — BP 130/76 | HR 66 | Temp 97.3°F | Wt 200.0 lb

## 2019-04-26 DIAGNOSIS — J45909 Unspecified asthma, uncomplicated: Secondary | ICD-10-CM

## 2019-04-26 DIAGNOSIS — M810 Age-related osteoporosis without current pathological fracture: Secondary | ICD-10-CM

## 2019-04-26 DIAGNOSIS — E119 Type 2 diabetes mellitus without complications: Secondary | ICD-10-CM | POA: Diagnosis not present

## 2019-04-26 DIAGNOSIS — E782 Mixed hyperlipidemia: Secondary | ICD-10-CM

## 2019-04-26 DIAGNOSIS — Z0000 Encounter for general adult medical examination without abnormal findings: Secondary | ICD-10-CM

## 2019-04-26 DIAGNOSIS — D649 Anemia, unspecified: Secondary | ICD-10-CM | POA: Diagnosis not present

## 2019-04-26 DIAGNOSIS — R6889 Other general symptoms and signs: Secondary | ICD-10-CM | POA: Diagnosis not present

## 2019-04-26 DIAGNOSIS — Z0001 Encounter for general adult medical examination with abnormal findings: Secondary | ICD-10-CM | POA: Diagnosis not present

## 2019-04-26 DIAGNOSIS — E559 Vitamin D deficiency, unspecified: Secondary | ICD-10-CM | POA: Diagnosis not present

## 2019-04-26 DIAGNOSIS — I10 Essential (primary) hypertension: Secondary | ICD-10-CM | POA: Diagnosis not present

## 2019-04-26 DIAGNOSIS — M545 Low back pain, unspecified: Secondary | ICD-10-CM

## 2019-04-26 DIAGNOSIS — K219 Gastro-esophageal reflux disease without esophagitis: Secondary | ICD-10-CM

## 2019-04-26 DIAGNOSIS — Z79899 Other long term (current) drug therapy: Secondary | ICD-10-CM

## 2019-04-26 NOTE — Patient Instructions (Signed)
INFORMATION ABOUT YOUR STEROID INHALER  Can do steroid inhaler, NEED TO DO DAILY, this is NOT a rescue inhaler so if you are acutely short of breath please use your albuterol or call 911.  Do 1 puff twice a day.  Do before you brush your teeth OR wash your mouth afterwards.  IF YOU DO NOT [place] YOUR MOUTH OUT IT CAN CAUSE YEAST Can do 2 tsp vinegar with water and switch to help prevent yeast or help yeast in your mouth.   Go to the ER if any chest pain, shortness of breath, nausea, dizziness, severe HA, changes vision/speech     When it comes to diets, agreement about the perfect plan isn't easy to find, even among the experts. Experts at the [employer] developed an idea known as the Healthy Eating Plate. Just imagine a plate divided into logical, healthy portions.  The emphasis is on diet quality:  Load up on vegetables and fruits - one-half of your plate: Aim for color and variety, and remember that potatoes don't count.  Go for whole grains - one-quarter of your plate: Whole wheat, barley, wheat berries, quinoa, oats, brown rice, and foods made with them. If you want pasta, go with whole wheat pasta.  Protein power - one-quarter of your plate: Fish, chicken, beans, and nuts are all healthy, versatile protein sources. Limit red meat.  The diet, however, does go beyond the plate, offering a few other suggestions.  Use healthy plant oils, such as olive, canola, soy, corn, sunflower and peanut. Check the labels, and avoid partially hydrogenated oil, which have unhealthy trans fats.  If you're thirsty, drink water. Coffee and tea are good in moderation, but skip sugary drinks and limit milk and dairy products to one or two daily servings.  The type of carbohydrate in the diet is more important than the amount. Some sources of carbohydrates, such as vegetables, fruits, whole grains, and beans-are healthier than others.  Finally, stay active.

## 2019-04-27 LAB — COMPLETE METABOLIC PANEL WITH GFR
AG Ratio: 1.7 (calc) (ref 1.0–2.5)
ALT: 16 U/L (ref 6–29)
AST: 20 U/L (ref 10–35)
Albumin: 4.5 g/dL (ref 3.6–5.1)
Alkaline phosphatase (APISO): 90 U/L (ref 37–153)
BUN: 11 mg/dL (ref 7–25)
CO2: 28 mmol/L (ref 20–32)
Calcium: 10.3 mg/dL (ref 8.6–10.4)
Chloride: 103 mmol/L (ref 98–110)
Creat: 0.83 mg/dL (ref 0.50–0.99)
GFR, Est African American: 85 mL/min/{1.73_m2} (ref >=60)
GFR, Est Non African American: 73 mL/min/{1.73_m2} (ref >=60)
Globulin: 2.6 g/dL (calc) (ref 1.9–3.7)
Glucose, Bld: 99 mg/dL (ref 65–99)
Potassium: 4.1 mmol/L (ref 3.5–5.3)
Sodium: 141 mmol/L (ref 135–146)
Total Bilirubin: 0.5 mg/dL (ref 0.2–1.2)
Total Protein: 7.1 g/dL (ref 6.1–8.1)

## 2019-04-27 LAB — IRON, TOTAL/TOTAL IRON BINDING CAP
%SAT: 20 % (calc) (ref 16–45)
Iron: 66 ug/dL (ref 45–160)
TIBC: 329 mcg/dL (calc) (ref 250–450)

## 2019-04-27 LAB — CBC WITH DIFFERENTIAL/PLATELET
Absolute Monocytes: 341 cells/uL (ref 200–950)
Basophils Absolute: 19 cells/uL (ref 0–200)
Basophils Relative: 0.3 %
Eosinophils Absolute: 68 cells/uL (ref 15–500)
Eosinophils Relative: 1.1 %
HCT: 38.9 % (ref 35.0–45.0)
Hemoglobin: 12.1 g/dL (ref 11.7–15.5)
Lymphs Abs: 3255 cells/uL (ref 850–3900)
MCH: 27.3 pg (ref 27.0–33.0)
MCHC: 31.1 g/dL — ABNORMAL LOW (ref 32.0–36.0)
MCV: 87.6 fL (ref 80.0–100.0)
MPV: 12 fL (ref 7.5–12.5)
Monocytes Relative: 5.5 %
Neutro Abs: 2517 cells/uL (ref 1500–7800)
Neutrophils Relative %: 40.6 %
Platelets: 292 10*3/uL (ref 140–400)
RBC: 4.44 10*6/uL (ref 3.80–5.10)
RDW: 13.6 % (ref 11.0–15.0)
Total Lymphocyte: 52.5 %
WBC: 6.2 10*3/uL (ref 3.8–10.8)

## 2019-04-27 LAB — LIPID PANEL
Cholesterol: 215 mg/dL — ABNORMAL HIGH (ref <200)
HDL: 78 mg/dL (ref >=50)
LDL Cholesterol (Calc): 112 mg/dL (calc) — ABNORMAL HIGH
Non-HDL Cholesterol (Calc): 137 mg/dL (calc) — ABNORMAL HIGH (ref <130)
Total CHOL/HDL Ratio: 2.8 (calc) (ref <5.0)
Triglycerides: 141 mg/dL (ref <150)

## 2019-04-27 LAB — FERRITIN: Ferritin: 120 ng/mL (ref 16–288)

## 2019-04-27 LAB — HEMOGLOBIN A1C
Hgb A1c MFr Bld: 6.3 % of total Hgb — ABNORMAL HIGH (ref <5.7)
Mean Plasma Glucose: 134 (calc)
eAG (mmol/L): 7.4 (calc)

## 2019-04-27 LAB — TSH: TSH: 2.48 mIU/L (ref 0.40–4.50)

## 2019-04-27 LAB — VITAMIN D 25 HYDROXY (VIT D DEFICIENCY, FRACTURES): Vit D, 25-Hydroxy: 57 ng/mL (ref 30–100)

## 2019-04-27 LAB — MAGNESIUM: Magnesium: 1.8 mg/dL (ref 1.5–2.5)

## 2019-05-03 DIAGNOSIS — J301 Allergic rhinitis due to pollen: Secondary | ICD-10-CM | POA: Diagnosis not present

## 2019-05-03 DIAGNOSIS — J3089 Other allergic rhinitis: Secondary | ICD-10-CM | POA: Diagnosis not present

## 2019-05-11 DIAGNOSIS — J301 Allergic rhinitis due to pollen: Secondary | ICD-10-CM | POA: Diagnosis not present

## 2019-05-11 DIAGNOSIS — J3089 Other allergic rhinitis: Secondary | ICD-10-CM | POA: Diagnosis not present

## 2019-05-17 DIAGNOSIS — J3089 Other allergic rhinitis: Secondary | ICD-10-CM | POA: Diagnosis not present

## 2019-05-17 DIAGNOSIS — J3081 Allergic rhinitis due to animal (cat) (dog) hair and dander: Secondary | ICD-10-CM | POA: Diagnosis not present

## 2019-05-17 DIAGNOSIS — J301 Allergic rhinitis due to pollen: Secondary | ICD-10-CM | POA: Diagnosis not present

## 2019-05-19 DIAGNOSIS — J3089 Other allergic rhinitis: Secondary | ICD-10-CM | POA: Diagnosis not present

## 2019-05-19 DIAGNOSIS — J3081 Allergic rhinitis due to animal (cat) (dog) hair and dander: Secondary | ICD-10-CM | POA: Diagnosis not present

## 2019-05-22 ENCOUNTER — Other Ambulatory Visit: Payer: Self-pay | Admitting: Adult Health

## 2019-05-23 DIAGNOSIS — J3089 Other allergic rhinitis: Secondary | ICD-10-CM | POA: Diagnosis not present

## 2019-05-23 DIAGNOSIS — J301 Allergic rhinitis due to pollen: Secondary | ICD-10-CM | POA: Diagnosis not present

## 2019-06-01 DIAGNOSIS — J3089 Other allergic rhinitis: Secondary | ICD-10-CM | POA: Diagnosis not present

## 2019-06-01 DIAGNOSIS — J301 Allergic rhinitis due to pollen: Secondary | ICD-10-CM | POA: Diagnosis not present

## 2019-06-06 ENCOUNTER — Other Ambulatory Visit: Payer: Self-pay | Admitting: Internal Medicine

## 2019-06-06 DIAGNOSIS — J3089 Other allergic rhinitis: Secondary | ICD-10-CM | POA: Diagnosis not present

## 2019-06-06 DIAGNOSIS — J301 Allergic rhinitis due to pollen: Secondary | ICD-10-CM | POA: Diagnosis not present

## 2019-06-10 ENCOUNTER — Other Ambulatory Visit: Payer: Self-pay | Admitting: Physician Assistant

## 2019-06-10 DIAGNOSIS — R232 Flushing: Secondary | ICD-10-CM

## 2019-06-11 DIAGNOSIS — R69 Illness, unspecified: Secondary | ICD-10-CM | POA: Diagnosis not present

## 2019-06-15 DIAGNOSIS — J301 Allergic rhinitis due to pollen: Secondary | ICD-10-CM | POA: Diagnosis not present

## 2019-06-15 DIAGNOSIS — J3089 Other allergic rhinitis: Secondary | ICD-10-CM | POA: Diagnosis not present

## 2019-06-21 DIAGNOSIS — J301 Allergic rhinitis due to pollen: Secondary | ICD-10-CM | POA: Diagnosis not present

## 2019-06-21 DIAGNOSIS — J3089 Other allergic rhinitis: Secondary | ICD-10-CM | POA: Diagnosis not present

## 2019-06-29 DIAGNOSIS — J301 Allergic rhinitis due to pollen: Secondary | ICD-10-CM | POA: Diagnosis not present

## 2019-06-29 DIAGNOSIS — J3089 Other allergic rhinitis: Secondary | ICD-10-CM | POA: Diagnosis not present

## 2019-07-04 DIAGNOSIS — J3089 Other allergic rhinitis: Secondary | ICD-10-CM | POA: Diagnosis not present

## 2019-07-04 DIAGNOSIS — J301 Allergic rhinitis due to pollen: Secondary | ICD-10-CM | POA: Diagnosis not present

## 2019-07-05 DIAGNOSIS — R29890 Loss of height: Secondary | ICD-10-CM | POA: Diagnosis not present

## 2019-07-05 DIAGNOSIS — Z1231 Encounter for screening mammogram for malignant neoplasm of breast: Secondary | ICD-10-CM | POA: Diagnosis not present

## 2019-07-05 DIAGNOSIS — Z90710 Acquired absence of both cervix and uterus: Secondary | ICD-10-CM | POA: Diagnosis not present

## 2019-07-05 DIAGNOSIS — M8589 Other specified disorders of bone density and structure, multiple sites: Secondary | ICD-10-CM | POA: Diagnosis not present

## 2019-07-05 LAB — HM DEXA SCAN

## 2019-07-05 LAB — HM MAMMOGRAPHY

## 2019-07-11 DIAGNOSIS — J3089 Other allergic rhinitis: Secondary | ICD-10-CM | POA: Diagnosis not present

## 2019-07-11 DIAGNOSIS — J301 Allergic rhinitis due to pollen: Secondary | ICD-10-CM | POA: Diagnosis not present

## 2019-07-17 DIAGNOSIS — J301 Allergic rhinitis due to pollen: Secondary | ICD-10-CM | POA: Diagnosis not present

## 2019-07-17 DIAGNOSIS — J3089 Other allergic rhinitis: Secondary | ICD-10-CM | POA: Diagnosis not present

## 2019-07-18 ENCOUNTER — Encounter: Payer: Self-pay | Admitting: Internal Medicine

## 2019-07-24 ENCOUNTER — Other Ambulatory Visit: Payer: Self-pay

## 2019-07-24 DIAGNOSIS — Z20822 Contact with and (suspected) exposure to covid-19: Secondary | ICD-10-CM

## 2019-07-25 ENCOUNTER — Other Ambulatory Visit: Payer: Self-pay | Admitting: Physician Assistant

## 2019-07-25 LAB — NOVEL CORONAVIRUS, NAA: SARS-CoV-2, NAA: NOT DETECTED

## 2019-07-26 DIAGNOSIS — J301 Allergic rhinitis due to pollen: Secondary | ICD-10-CM | POA: Diagnosis not present

## 2019-07-26 DIAGNOSIS — J3089 Other allergic rhinitis: Secondary | ICD-10-CM | POA: Diagnosis not present

## 2019-07-28 ENCOUNTER — Other Ambulatory Visit: Payer: Self-pay | Admitting: Adult Health

## 2019-07-28 DIAGNOSIS — R69 Illness, unspecified: Secondary | ICD-10-CM | POA: Diagnosis not present

## 2019-08-01 DIAGNOSIS — J3089 Other allergic rhinitis: Secondary | ICD-10-CM | POA: Diagnosis not present

## 2019-08-01 DIAGNOSIS — J301 Allergic rhinitis due to pollen: Secondary | ICD-10-CM | POA: Diagnosis not present

## 2019-08-09 ENCOUNTER — Other Ambulatory Visit: Payer: Self-pay

## 2019-08-09 ENCOUNTER — Ambulatory Visit (INDEPENDENT_AMBULATORY_CARE_PROVIDER_SITE_OTHER): Payer: Medicare HMO | Admitting: Internal Medicine

## 2019-08-09 VITALS — BP 124/72 | HR 72 | Temp 97.0°F | Resp 16 | Ht 63.0 in | Wt 200.4 lb

## 2019-08-09 DIAGNOSIS — K219 Gastro-esophageal reflux disease without esophagitis: Secondary | ICD-10-CM | POA: Diagnosis not present

## 2019-08-09 DIAGNOSIS — Z79899 Other long term (current) drug therapy: Secondary | ICD-10-CM

## 2019-08-09 DIAGNOSIS — N182 Chronic kidney disease, stage 2 (mild): Secondary | ICD-10-CM | POA: Diagnosis not present

## 2019-08-09 DIAGNOSIS — E1122 Type 2 diabetes mellitus with diabetic chronic kidney disease: Secondary | ICD-10-CM

## 2019-08-09 DIAGNOSIS — E782 Mixed hyperlipidemia: Secondary | ICD-10-CM | POA: Diagnosis not present

## 2019-08-09 DIAGNOSIS — I10 Essential (primary) hypertension: Secondary | ICD-10-CM

## 2019-08-09 DIAGNOSIS — J3089 Other allergic rhinitis: Secondary | ICD-10-CM | POA: Diagnosis not present

## 2019-08-09 DIAGNOSIS — E559 Vitamin D deficiency, unspecified: Secondary | ICD-10-CM

## 2019-08-09 DIAGNOSIS — J301 Allergic rhinitis due to pollen: Secondary | ICD-10-CM | POA: Diagnosis not present

## 2019-08-09 MED ORDER — METFORMIN HCL 500 MG PO TABS: ORAL_TABLET | ORAL | 3 refills | Status: DC

## 2019-08-09 NOTE — Patient Instructions (Signed)

## 2019-08-09 NOTE — Progress Notes (Signed)
History of Present Illness:      This very nice 68 y.o. DBF presents for 3 month follow up with HTN, HLD, T2_NIDDM and Vitamin D Deficiency. Her GERD is controlled on Omeprazole.       Patient is treated for HTN (2013) & BP has been controlled at home. Today's BP: 124/72. Patient has had no complaints of any cardiac type chest pain, palpitations, dyspnea / orthopnea / PND, dizziness, claudication, or dependent edema.      Hyperlipidemia is not controlled with diet & meds. Patient denies myalgias or other med SE's. Last Lipids were not at goal: Lab Results  Component Value Date   CHOL 215 (H) 04/26/2019   HDL 78 04/26/2019   LDLCALC 112 (H) 04/26/2019   TRIG 141 04/26/2019   CHOLHDL 2.8 04/26/2019       Also, the patient has Morbid obesity (BMI 35+) and history of T2_NIDDM w/CKD2 in July 2019 was started on Metformin. She denies  symptoms of reactive hypoglycemia, diabetic polys, paresthesias or visual blurring.  Last A1c was not at goal: Lab Results  Component Value Date   HGBA1C 6.3 (H) 04/26/2019       Further, the patient also has history of Vitamin D Deficiency ("24" / 2019) and supplements vitamin D without any suspected side-effects. Last vitamin D was near goal: Lab Results  Component Value Date   VD25OH 57 04/26/2019   Current Outpatient Medications on File Prior to Visit  Medication Sig  . alendronate (FOSAMAX) 70 MG tablet Take 1 tablet on an Empty Stomach with a full glass of water - every 7 days for Osteoporosis  . Apoaequorin (PREVAGEN PO) Take by mouth.  . blood glucose meter kit and supplies Test sugars twice daily. Dx E11.9  . calcium carbonate (CALCIUM 600) 600 MG TABS tablet Take 600 mg by mouth daily.  . Cholecalciferol (VITAMIN D3) 5000 UNITS TABS Take 5,000 Units by mouth daily.  . cyanocobalamin 100 MCG tablet Take 100 mcg by mouth daily.  . cyclobenzaprine (FLEXERIL) 10 MG tablet Take 1 tablet (10 mg total) by mouth at bedtime as needed for muscle  spasms.  . fexofenadine (ALLEGRA) 180 MG tablet Take 1 tablet (180 mg total) by mouth daily.  . fluticasone-salmeterol (ADVAIR HFA) 115-21 MCG/ACT inhaler Inhale 2 puffs into the lungs 2 (two) times daily.  . Glucosamine-Chondroitin (MOVE FREE PO) Take by mouth.  . hydrochlorothiazide (HYDRODIURIL) 25 MG tablet TAKE 1 TABLET BY MOUTH EVERY DAY  . Lancets (ONETOUCH DELICA PLUS [id number]) MISC TEST SUGAR TWICE A DAY  . MAGNESIUM PO Take 500 mg by mouth daily.   . meloxicam (MOBIC) 15 MG tablet TAKE 1/2 TO 1 TABLET DAILY WITH FOOD FOR PAIN & INFLAMMATION AND LIMIT TO 5 DAYS/WEEK TO AVOID KIDNEY DAMAGE  . metFORMIN (GLUCOPHAGE-XR) 500 MG 24 hr tablet Take 1 tab daily with largest meal of the day.  Marland Kitchen omeprazole (PRILOSEC) 40 MG capsule Take 1 capsule Daily for Indigestion & Heartburn  . ONETOUCH ULTRA test strip TEST SUGAR TWICE A DAY  . OVER THE COUNTER MEDICATION   . Red Yeast Rice Extract (RED YEAST RICE PO) Take 2,000 mg by mouth daily.   No current facility-administered medications on file prior to visit.    Allergies  Allergen Reactions  . Metronidazole Other (See Comments)    unknown  . Pneumovax 23 [Pneumococcal Vac Polyvalent]     Redness and severe swelling at injection site  . Ppd [Tuberculin Purified Protein  Derivative] Other (See Comments)    + PPD 2013 with NEG CXR 05/2012   PMHx:   Past Medical History:  Diagnosis Date  . Anemia   . Arthritis    "right hand, back" (04/03/2016)  . Asthma   . Bilateral carpal tunnel syndrome   . Chronic lower back pain   . GERD (gastroesophageal reflux disease)   . Hyperlipidemia   . Hypertension   . Multiple allergies   . Pneumonia 03/2012  . Positive PPD    "they told me it wasn't positive; I was allergic to the test itself"  . Type II diabetes mellitus ([place])   . Vitamin deficiency   . Wears dentures    upper  . Wears glasses    Immunization History  Administered Date(s) Administered  . Influenza, High Dose Seasonal PF  11/10/2017, 09/15/2018  . Influenza, Seasonal, Injecte, Preservative Fre 10/17/2015  . Influenza,inj,quad, With Preservative 10/15/2016  . Pneumococcal Conjugate-13 12/22/2018  . Pneumococcal Polysaccharide-23 02/04/2017  . Pneumococcal-Unspecified 06/27/2013  . Tdap 04/23/2012  . Zoster 07/31/2015   Past Surgical History:  Procedure Laterality Date  . ABDOMINAL HYSTERECTOMY    . BILATERAL SALPINGOOPHORECTOMY  11/28/2010   open laparoscopy with adhesiolysis also  . CARPAL TUNNEL RELEASE  07/15/2012   Procedure: CARPAL TUNNEL RELEASE;  Surgeon: Cammie Sickle., MD;  Location: [place];  Service: Orthopedics;  Laterality: Left;  . CARPAL TUNNEL RELEASE Right 05/31/2014   Procedure: RIGHT CARPAL TUNNEL RELEASE;  Surgeon: Cammie Sickle, MD;  Location: [place];  Service: Orthopedics;  Laterality: Right;  . COLONOSCOPY    . KNEE ARTHROSCOPY Bilateral 2005-2016   "right-left"  . SHOULDER ARTHROSCOPY WITH ROTATOR CUFF REPAIR AND SUBACROMIAL DECOMPRESSION Right 01/20/2013   Procedure: RIGHT ARTHROSCOPY SHOULDER DEBRIDEMENT LIMITED, ARTHROSCOPY SHOULDER DECOMPRESSION SUBACROMIAL PARTIAL ACROMIOPLASTY WITH CORACOACROMIAL RELEASE, ROTATOR CUFF REPAIR ;  Surgeon: Johnny Bridge, MD;  Location: [facility];  Service: Orthopedics;  Laterality: Right;  RIGHT SHOULDER SCOPE DEBRIDEMENT, ACRIOMIOPLASTY, ROTATOR CUFF REPAIR  . TUBAL LIGATION     FHx:    Reviewed / unchanged  SHx:    Reviewed / unchanged   Systems Review:  Constitutional: Denies fever, chills, wt changes, headaches, insomnia, fatigue, night sweats, change in appetite. Eyes: Denies redness, blurred vision, diplopia, discharge, itchy, watery eyes.  ENT: Denies discharge, congestion, post nasal drip, epistaxis, sore throat, earache, hearing loss, dental pain, tinnitus, vertigo, sinus pain, snoring.  CV: Denies chest pain, palpitations, irregular heartbeat, syncope, dyspnea,  diaphoresis, orthopnea, PND, claudication or edema. Respiratory: denies cough, dyspnea, DOE, pleurisy, hoarseness, laryngitis, wheezing.  Gastrointestinal: Denies dysphagia, odynophagia, heartburn, reflux, water brash, abdominal pain or cramps, nausea, vomiting, bloating, diarrhea, constipation, hematemesis, melena, hematochezia  or hemorrhoids. Genitourinary: Denies dysuria, frequency, urgency, nocturia, hesitancy, discharge, hematuria or flank pain. Musculoskeletal: Denies arthralgias, myalgias, stiffness, jt. swelling, pain, limping or strain/sprain.  Skin: Denies pruritus, rash, hives, warts, acne, eczema or change in skin lesion(s). Neuro: No weakness, tremor, incoordination, spasms, paresthesia or pain. Psychiatric: Denies confusion, memory loss or sensory loss. Endo: Denies change in weight, skin or hair change.  Heme/Lymph: No excessive bleeding, bruising or enlarged lymph nodes.  Physical Exam  BP 124/72   Pulse 72   Temp (!) 97 F (36.1 C)   Resp 16   Ht '5\' 3"'  (1.6 m)   Wt 200 lb 6.4 oz (90.9 kg)   BMI 35.50 kg/m   Appears  Over nourished, well groomed  and in no  distress.  Eyes: PERRLA, EOMs, conjunctiva no swelling or erythema. Sinuses: No frontal/maxillary tenderness ENT/Mouth: EAC's clear, TM's nl w/o erythema, bulging. Nares clear w/o erythema, swelling, exudates. Oropharynx clear without erythema or exudates. Oral hygiene is good. Tongue normal, non obstructing. Hearing intact.  Neck: Supple. Thyroid not palpable. Car 2+/2+ without bruits, nodes or JVD. Chest: Respirations nl with BS clear & equal w/o rales, rhonchi, wheezing or stridor.  Cor: Heart sounds normal w/ regular rate and rhythm without sig. murmurs, gallops, clicks or rubs. Peripheral pulses normal and equal  without edema.  Abdomen: Soft & bowel sounds normal. Non-tender w/o guarding, rebound, hernias, masses or organomegaly.  Lymphatics: Unremarkable.  Musculoskeletal: Full ROM all peripheral  extremities, joint stability, 5/5 strength and normal gait.  Skin: Warm, dry without exposed rashes, lesions or ecchymosis apparent.  Neuro: Cranial nerves intact, reflexes equal bilaterally. Sensory-motor testing grossly intact. Tendon reflexes grossly intact.  Pysch: Alert & oriented x 3.  Insight and judgement nl & appropriate. No ideations.  Assessment and Plan:  1. Essential hypertension  - Continue medication, monitor blood pressure at home.  - Continue DASH diet.  Reminder to go to the ER if any CP,  SOB, nausea, dizziness, severe HA, changes vision/speech.  - CBC with Differential/Platelet - COMPLETE METABOLIC PANEL WITH GFR - Magnesium - TSH  2. Hyperlipidemia, mixed  - Continue diet/meds, exercise,& lifestyle modifications.  - Continue monitor periodic cholesterol/liver & renal functions   - Lipid panel - TSH  3. Type 2 diabetes mellitus with stage 2 chronic kidney disease,  without long-term current use of insulin ([name])  - Continue diet, exercise  - Lifestyle modifications.  - Monitor appropriate labs.  - Hemoglobin A1c - Insulin, random  - metFORMIN 500 MG ; Take 1 tablet  3 x /day with Meals   Disp: 270 tab; Rf: 3  4. Vitamin D deficiency  - Continue supplementation.  - VITAMIN D 25 Hydroxyl  5. Gastroesophageal reflux disease  - CBC with Differential/Platelet  6. Medication management  - CBC with Differential/Platelet - COMPLETE METABOLIC PANEL WITH GFR - Magnesium - Lipid panel - TSH - Hemoglobin A1c - Insulin, random - VITAMIN D 25 Hydroxyl        Discussed  regular exercise, BP monitoring, weight control to achieve/maintain BMI less than 25 and discussed med and SE's. Recommended labs to assess and monitor clinical status with further disposition pending results of labs.  I discussed the assessment and treatment plan with the patient. The patient was provided an opportunity to ask questions and all were answered. The patient agreed with the  plan and demonstrated an understanding of the instructions.  I provided over 30 minutes of exam, counseling, chart review and  complex critical decision making.  Kirtland Bouchard, MD

## 2019-08-10 LAB — COMPLETE METABOLIC PANEL WITH GFR
AG Ratio: 1.6 (calc) (ref 1.0–2.5)
ALT: 16 U/L (ref 6–29)
AST: 20 U/L (ref 10–35)
Albumin: 4.2 g/dL (ref 3.6–5.1)
Alkaline phosphatase (APISO): 79 U/L (ref 37–153)
BUN: 14 mg/dL (ref 7–25)
CO2: 28 mmol/L (ref 20–32)
Calcium: 9.6 mg/dL (ref 8.6–10.4)
Chloride: 107 mmol/L (ref 98–110)
Creat: 0.83 mg/dL (ref 0.50–0.99)
GFR, Est African American: 85 mL/min/{1.73_m2} (ref >=60)
GFR, Est Non African American: 73 mL/min/{1.73_m2} (ref >=60)
Globulin: 2.6 g/dL (calc) (ref 1.9–3.7)
Glucose, Bld: 108 mg/dL — ABNORMAL HIGH (ref 65–99)
Potassium: 4.3 mmol/L (ref 3.5–5.3)
Sodium: 142 mmol/L (ref 135–146)
Total Bilirubin: 0.5 mg/dL (ref 0.2–1.2)
Total Protein: 6.8 g/dL (ref 6.1–8.1)

## 2019-08-10 LAB — CBC WITH DIFFERENTIAL/PLATELET
Absolute Monocytes: 336 cells/uL (ref 200–950)
Basophils Absolute: 12 cells/uL (ref 0–200)
Basophils Relative: 0.2 %
Eosinophils Absolute: 122 cells/uL (ref 15–500)
Eosinophils Relative: 2.1 %
HCT: 39.2 % (ref 35.0–45.0)
Hemoglobin: 12.1 g/dL (ref 11.7–15.5)
Lymphs Abs: 2732 cells/uL (ref 850–3900)
MCH: 26.7 pg — ABNORMAL LOW (ref 27.0–33.0)
MCHC: 30.9 g/dL — ABNORMAL LOW (ref 32.0–36.0)
MCV: 86.3 fL (ref 80.0–100.0)
MPV: 12.4 fL (ref 7.5–12.5)
Monocytes Relative: 5.8 %
Neutro Abs: 2598 cells/uL (ref 1500–7800)
Neutrophils Relative %: 44.8 %
Platelets: 259 10*3/uL (ref 140–400)
RBC: 4.54 10*6/uL (ref 3.80–5.10)
RDW: 13.9 % (ref 11.0–15.0)
Total Lymphocyte: 47.1 %
WBC: 5.8 10*3/uL (ref 3.8–10.8)

## 2019-08-10 LAB — MAGNESIUM: Magnesium: 1.8 mg/dL (ref 1.5–2.5)

## 2019-08-10 LAB — TSH: TSH: 1.76 mIU/L (ref 0.40–4.50)

## 2019-08-10 LAB — HEMOGLOBIN A1C
Hgb A1c MFr Bld: 6.4 % of total Hgb — ABNORMAL HIGH (ref <5.7)
Mean Plasma Glucose: 137 (calc)
eAG (mmol/L): 7.6 (calc)

## 2019-08-10 LAB — LIPID PANEL
Cholesterol: 188 mg/dL (ref <200)
HDL: 75 mg/dL (ref >=50)
LDL Cholesterol (Calc): 98 mg/dL (calc)
Non-HDL Cholesterol (Calc): 113 mg/dL (calc) (ref <130)
Total CHOL/HDL Ratio: 2.5 (calc) (ref <5.0)
Triglycerides: 65 mg/dL (ref <150)

## 2019-08-10 LAB — VITAMIN D 25 HYDROXY (VIT D DEFICIENCY, FRACTURES): Vit D, 25-Hydroxy: 65 ng/mL (ref 30–100)

## 2019-08-10 LAB — INSULIN, RANDOM: Insulin: 14.7 u[IU]/mL

## 2019-08-11 ENCOUNTER — Encounter: Payer: Self-pay | Admitting: Internal Medicine

## 2019-08-15 DIAGNOSIS — J3089 Other allergic rhinitis: Secondary | ICD-10-CM | POA: Diagnosis not present

## 2019-08-15 DIAGNOSIS — J301 Allergic rhinitis due to pollen: Secondary | ICD-10-CM | POA: Diagnosis not present

## 2019-08-17 ENCOUNTER — Other Ambulatory Visit: Payer: Self-pay | Admitting: Adult Health

## 2019-08-23 DIAGNOSIS — J301 Allergic rhinitis due to pollen: Secondary | ICD-10-CM | POA: Diagnosis not present

## 2019-08-23 DIAGNOSIS — J3089 Other allergic rhinitis: Secondary | ICD-10-CM | POA: Diagnosis not present

## 2019-08-24 ENCOUNTER — Telehealth: Payer: Self-pay | Admitting: Physical Medicine and Rehabilitation

## 2019-08-24 NOTE — Telephone Encounter (Signed)
Patient asked about RFA. She had facet injections in February and March. She reports that the injections did give her at least 50% relief, but the relief does not last longer than a few weeks. She states that she was seen by physical therapy before she starting having injections. Please advise- repeat vs auth for RFA.

## 2019-08-24 NOTE — Telephone Encounter (Signed)
ok 

## 2019-08-24 NOTE — Telephone Encounter (Signed)
Left message #1

## 2019-08-25 NOTE — Telephone Encounter (Signed)
Sounds good, make sure I have dictated all. Probably was waiting on PT

## 2019-08-25 NOTE — Telephone Encounter (Signed)
Spoke with Truman Hayward and she states PA is needed for [phone] and [phone]. Started case and received approval.  Auth# [id number] Effective dates 08/25/2019-02/21/2020.

## 2019-08-25 NOTE — Telephone Encounter (Signed)
Called pt and lvm #1 

## 2019-08-25 NOTE — Telephone Encounter (Signed)
Scheduled for 10/21 at 1530 with driver.

## 2019-08-25 NOTE — Telephone Encounter (Signed)
See messages below. Patient has [employer]. Needs auth for RFA. Patient states that she has had PT, but I do not know dates or if notes are in system.

## 2019-08-26 DIAGNOSIS — R51 Headache: Secondary | ICD-10-CM | POA: Diagnosis not present

## 2019-08-26 DIAGNOSIS — Z20828 Contact with and (suspected) exposure to other viral communicable diseases: Secondary | ICD-10-CM | POA: Diagnosis not present

## 2019-08-28 DIAGNOSIS — Z1159 Encounter for screening for other viral diseases: Secondary | ICD-10-CM | POA: Diagnosis not present

## 2019-08-29 DIAGNOSIS — J301 Allergic rhinitis due to pollen: Secondary | ICD-10-CM | POA: Diagnosis not present

## 2019-08-29 DIAGNOSIS — J3089 Other allergic rhinitis: Secondary | ICD-10-CM | POA: Diagnosis not present

## 2019-09-04 ENCOUNTER — Other Ambulatory Visit: Payer: Self-pay

## 2019-09-04 ENCOUNTER — Ambulatory Visit (INDEPENDENT_AMBULATORY_CARE_PROVIDER_SITE_OTHER): Payer: Medicare HMO

## 2019-09-04 VITALS — Temp 97.5°F

## 2019-09-04 DIAGNOSIS — Z23 Encounter for immunization: Secondary | ICD-10-CM | POA: Diagnosis not present

## 2019-09-04 NOTE — Progress Notes (Signed)
Patient presents to the office forHDFlu Vaccine. Vaccine administered toLEFTDeltoid withoutanycomplication. Temperature taken and recorded 

## 2019-09-06 DIAGNOSIS — J3089 Other allergic rhinitis: Secondary | ICD-10-CM | POA: Diagnosis not present

## 2019-09-06 DIAGNOSIS — J301 Allergic rhinitis due to pollen: Secondary | ICD-10-CM | POA: Diagnosis not present

## 2019-09-11 ENCOUNTER — Other Ambulatory Visit: Payer: Self-pay

## 2019-09-11 DIAGNOSIS — Z20822 Contact with and (suspected) exposure to covid-19: Secondary | ICD-10-CM

## 2019-09-11 DIAGNOSIS — Z20828 Contact with and (suspected) exposure to other viral communicable diseases: Secondary | ICD-10-CM | POA: Diagnosis not present

## 2019-09-12 LAB — NOVEL CORONAVIRUS, NAA: SARS-CoV-2, NAA: NOT DETECTED

## 2019-09-19 DIAGNOSIS — J301 Allergic rhinitis due to pollen: Secondary | ICD-10-CM | POA: Diagnosis not present

## 2019-09-19 DIAGNOSIS — R69 Illness, unspecified: Secondary | ICD-10-CM | POA: Diagnosis not present

## 2019-09-19 DIAGNOSIS — J3089 Other allergic rhinitis: Secondary | ICD-10-CM | POA: Diagnosis not present

## 2019-09-20 ENCOUNTER — Encounter: Payer: Self-pay | Admitting: Physical Medicine and Rehabilitation

## 2019-09-20 ENCOUNTER — Ambulatory Visit: Payer: Self-pay

## 2019-09-20 ENCOUNTER — Ambulatory Visit (INDEPENDENT_AMBULATORY_CARE_PROVIDER_SITE_OTHER): Payer: Medicare HMO | Admitting: Physical Medicine and Rehabilitation

## 2019-09-20 ENCOUNTER — Other Ambulatory Visit: Payer: Self-pay

## 2019-09-20 VITALS — BP 124/75 | HR 82

## 2019-09-20 DIAGNOSIS — M47816 Spondylosis without myelopathy or radiculopathy, lumbar region: Secondary | ICD-10-CM | POA: Diagnosis not present

## 2019-09-20 MED ORDER — METHYLPREDNISOLONE ACETATE 80 MG/ML IJ SUSP
80.0000 mg | Freq: Once | INTRAMUSCULAR | Status: AC
  Administered 2019-09-20: 80 mg

## 2019-09-20 NOTE — Progress Notes (Signed)
 .  Numeric Pain Rating Scale and Functional Assessment Average Pain 5   In the last MONTH (on 0-10 scale) has pain interfered with the following?  1. General activity like being  able to carry out your everyday physical activities such as walking, climbing stairs, carrying groceries, or moving a chair?  Rating(6)   +Driver, -BT, -Dye Allergies.  

## 2019-09-21 NOTE — Procedures (Signed)
Lumbar Facet Joint Nerve Denervation  Patient: Lauren Lopez      Date of Birth: 1951/02/21 MRN: [id number] PCP: Unk Pinto, MD      Visit Date: 09/20/2019   Universal Protocol:    Date/Time: 10/22/205:47 AM  Consent Given By: the patient  Position: PRONE  Additional Comments: Vital signs were monitored before and after the procedure. Patient was prepped and draped in the usual sterile fashion. The correct patient, procedure, and site was verified.   Injection Procedure Details:  Procedure Site One Meds Administered:  Meds ordered this encounter  Medications  . methylPREDNISolone acetate (DEPO-MEDROL) injection 80 mg     Laterality: Right  Location/Site: According to numbering scheme on prior radiology images patient has a fully sacralized L5 segment. L3-L4 L4-L5  Needle size: 18 G  Needle type: Radiofrequency cannula  Needle Placement: Along juncture of superior articular process and transverse pocess  Findings:  -Comments:  Procedure Details: For each desired target nerve, the corresponding transverse process (sacral ala for the L5 dorsal rami) was identified and the fluoroscope was positioned to square off the endplates of the corresponding vertebral body to achieve a true AP midline view.  The beam was then obliqued 15 to 20 degrees and caudally tilted 15 to 20 degrees to line up a trajectory along the target nerves. The skin over the target of the junction of superior articulating process and transverse process (sacral ala for the L5 dorsal rami) was infiltrated with 31ml of 1% Lidocaine without Epinephrine.  The 18 gauge 84mm active tip outer cannula was advanced in trajectory view to the target.  This procedure was repeated for each target nerve.  Then, for all levels, the outer cannula placement was fine-tuned and the position was then confirmed with bi-planar imaging.    Test stimulation was done both at sensory and motor levels to ensure there was no  radicular stimulation. The target tissues were then infiltrated with 1 ml of 1% Lidocaine without Epinephrine. Subsequently, a percutaneous neurotomy was carried out for 90 seconds at 80 degrees Celsius.  After the completion of the lesion, 1 ml of injectate was delivered. It was then repeated for each facet joint nerve mentioned above. Appropriate radiographs were obtained to verify the probe placement during the neurotomy.   Additional Comments:  The patient tolerated the procedure well Dressing: 2 x 2 sterile gauze and Band-Aid    Post-procedure details: Patient was observed during the procedure. Post-procedure instructions were reviewed.  Patient left the clinic in stable condition.

## 2019-09-21 NOTE — Progress Notes (Signed)
Lauren Lopez - 68 y.o. female MRN [id number]  Date of birth: 08-04-1951  Office Visit Note: Visit Date: 09/20/2019 PCP: Unk Pinto, MD Referred by: Unk Pinto, MD  Subjective: Chief Complaint  Patient presents with  . Lower Back - Pain   HPI:  Lauren Lopez is a 68 y.o. female who comes in today For planned radiofrequency ablation of the right L3-4 and L4-5 facet joints.  Patient has a fully sacralized L5 segment based on prior radiological imaging and numbering scheme.  Images will appear that the ablation is done at L4-5 and L5-S1.  She has had prior double diagnostic blocks and physical therapy and medication management without much relief except for the diagnostic blocks which were documented to give her over 70% relief of her right-sided low back pain.  Imaging shows facet arthritis particular at L5-S1 on the right but bilateral.  We are going to complete ablation today for more definitive care.  She was also complaining about her knees today and this is something that she has followed with Dr. Wynelle Link at [facility] and she will follow up with him for her knees.  ROS Otherwise per HPI.  Assessment & Plan: Visit Diagnoses:  1. Spondylosis without myelopathy or radiculopathy, lumbar region     Plan: No additional findings.   Meds & Orders:  Meds ordered this encounter  Medications  . methylPREDNISolone acetate (DEPO-MEDROL) injection 80 mg    Orders Placed This Encounter  Procedures  . Radiofrequency,Lumbar  . XR C-ARM NO REPORT    Follow-up: Return if symptoms worsen or fail to improve.   Procedures: No procedures performed  Lumbar Facet Joint Nerve Denervation  Patient: Lauren Lopez      Date of Birth: [birth year] MRN: [id number] PCP: Unk Pinto, MD      Visit Date: 09/20/2019   Universal Protocol:    Date/Time: 10/22/205:47 AM  Consent Given By: the patient  Position: PRONE  Additional Comments: Vital signs were monitored  before and after the procedure. Patient was prepped and draped in the usual sterile fashion. The correct patient, procedure, and site was verified.   Injection Procedure Details:  Procedure Site One Meds Administered:  Meds ordered this encounter  Medications  . methylPREDNISolone acetate (DEPO-MEDROL) injection 80 mg     Laterality: Right  Location/Site: According to numbering scheme on prior radiology images patient has a fully sacralized L5 segment. L3-L4 L4-L5  Needle size: 18 G  Needle type: Radiofrequency cannula  Needle Placement: Along juncture of superior articular process and transverse pocess  Findings:  -Comments:  Procedure Details: For each desired target nerve, the corresponding transverse process (sacral ala for the L5 dorsal rami) was identified and the fluoroscope was positioned to square off the endplates of the corresponding vertebral body to achieve a true AP midline view.  The beam was then obliqued 15 to 20 degrees and caudally tilted 15 to 20 degrees to line up a trajectory along the target nerves. The skin over the target of the junction of superior articulating process and transverse process (sacral ala for the L5 dorsal rami) was infiltrated with 79ml of 1% Lidocaine without Epinephrine.  The 18 gauge 6mm active tip outer cannula was advanced in trajectory view to the target.  This procedure was repeated for each target nerve.  Then, for all levels, the outer cannula placement was fine-tuned and the position was then confirmed with bi-planar imaging.    Test stimulation was done both at sensory and motor levels  to ensure there was no radicular stimulation. The target tissues were then infiltrated with 1 ml of 1% Lidocaine without Epinephrine. Subsequently, a percutaneous neurotomy was carried out for 90 seconds at 80 degrees Celsius.  After the completion of the lesion, 1 ml of injectate was delivered. It was then repeated for each facet joint nerve  mentioned above. Appropriate radiographs were obtained to verify the probe placement during the neurotomy.   Additional Comments:  The patient tolerated the procedure well Dressing: 2 x 2 sterile gauze and Band-Aid    Post-procedure details: Patient was observed during the procedure. Post-procedure instructions were reviewed.  Patient left the clinic in stable condition.      Clinical History: MRI LUMBAR SPINE WITHOUT CONTRAST  TECHNIQUE: Multiplanar, multisequence MR imaging of the lumbar spine was performed. No intravenous contrast was administered.  COMPARISON:  Lumbar radiographs 02/24/2018 and earlier. CT Abdomen and Pelvis 10/20/2010. chest CTA 04/03/2016.  FINDINGS: Segmentation: 12 pairs of ribs including the lowest which are hypoplastic demonstrated on the 2017 CTA. Subsequently there are 4 lumbar type vertebral bodies. Therefore, the L5 level is fully sacralized with a vestigial L5-S1 disc space. Correlation with radiographs is recommended prior to any operative intervention.  Alignment: Chronic grade 1 anterolisthesis of L3 on L4 measuring 3-4 millimeters appears increased since 2011. Stable vertebral height and alignment elsewhere with mild straightening of lumbar lordosis.  Vertebrae: No marrow edema or evidence of acute osseous abnormality. Visualized bone marrow signal is within normal limits. Intact visible sacrum and SI joints.  Conus medullaris and cauda equina: Conus extends to the L1 level. No lower spinal cord or conus signal abnormality.  Paraspinal and other soft tissues: Stable since 2011 and negative visible abdominal viscera. Negative visualized posterior paraspinal soft tissues.  Disc levels:  T10-T11: Negative.  T11-T12: Negative.  T12-L1: Mild disc bulging with cephalad extruded disc fragment occupying the left ventral epidural space posterior to the T12 vertebral body (series 8, image 8, series 11, image 4. The extruded  disc fragment encompasses 4 x 10 x 10 millimeters (AP by transverse by CC). This is in proximity to the descending left T12 nerve roots in the lateral recess. No significant spinal or foraminal stenosis.  L1-L2: Minimal far lateral disc bulging and facet hypertrophy. No stenosis.  L2-L3: Mild far lateral disc bulging and endplate spurring. Moderate facet hypertrophy. No significant stenosis.  L3-L4: Grade 1 anterolisthesis. Disc desiccation and mild disc space loss with circumferential but mostly foraminal and far lateral disc bulging. Moderate to severe facet and ligament flavum hypertrophy. Mild spinal stenosis with mild bilateral lateral recess stenosis (L4 nerve levels), and mild to moderate bilateral L3 neural foraminal stenosis.  L4-L5: Vacuum disc. Circumferential disc bulge with broad-based foraminal involvement. Moderate facet and ligament flavum hypertrophy. No spinal or lateral recess stenosis. Mild to moderate bilateral L4 foraminal stenosis appears greater on the right.  L5-S1:  Sacralized and negative.  IMPRESSION: 1. 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 Correlation with radiographs is recommended prior to any operative intervention. 2. Grade 1 spondylolisthesis at L3-L4 is chronic but progressed since 2011. There is multifactorial mild spinal stenosis with mild lateral recess and up to moderate neural foraminal stenosis. Query right L3 and/or L4 radiculitis. 3. Up to moderate multifactorial neural foraminal stenosis also at L4-L5; the exiting L4 nerve levels. 4. There is a small left-side extruded disc fragment posterior to the T12 vertebral body. No significant spinal stenosis, this could be a source for left T12 radiculitis. 5.  Mild for age lumbar spine degeneration elsewhere.   Electronically Signed   By: Genevie Ann M.D.   On: 06/21/2018 09:11     Objective:  VS:  HT:    WT:   BMI:     BP:124/75  HR:82bpm  TEMP: (  )  RESP:  Physical Exam  Ortho Exam Imaging: Xr C-arm No Report  Result Date: 09/20/2019 Please see Notes tab for imaging impression.

## 2019-09-26 DIAGNOSIS — J3089 Other allergic rhinitis: Secondary | ICD-10-CM | POA: Diagnosis not present

## 2019-09-26 DIAGNOSIS — J301 Allergic rhinitis due to pollen: Secondary | ICD-10-CM | POA: Diagnosis not present

## 2019-10-04 DIAGNOSIS — J3089 Other allergic rhinitis: Secondary | ICD-10-CM | POA: Diagnosis not present

## 2019-10-04 DIAGNOSIS — J301 Allergic rhinitis due to pollen: Secondary | ICD-10-CM | POA: Diagnosis not present

## 2019-10-10 DIAGNOSIS — J301 Allergic rhinitis due to pollen: Secondary | ICD-10-CM | POA: Diagnosis not present

## 2019-10-10 DIAGNOSIS — J3089 Other allergic rhinitis: Secondary | ICD-10-CM | POA: Diagnosis not present

## 2019-10-16 DIAGNOSIS — R69 Illness, unspecified: Secondary | ICD-10-CM | POA: Diagnosis not present

## 2019-10-18 DIAGNOSIS — J3089 Other allergic rhinitis: Secondary | ICD-10-CM | POA: Diagnosis not present

## 2019-10-18 DIAGNOSIS — J301 Allergic rhinitis due to pollen: Secondary | ICD-10-CM | POA: Diagnosis not present

## 2019-10-24 DIAGNOSIS — J301 Allergic rhinitis due to pollen: Secondary | ICD-10-CM | POA: Diagnosis not present

## 2019-10-24 DIAGNOSIS — J3089 Other allergic rhinitis: Secondary | ICD-10-CM | POA: Diagnosis not present

## 2019-11-01 DIAGNOSIS — J301 Allergic rhinitis due to pollen: Secondary | ICD-10-CM | POA: Diagnosis not present

## 2019-11-05 DIAGNOSIS — R69 Illness, unspecified: Secondary | ICD-10-CM | POA: Diagnosis not present

## 2019-11-06 DIAGNOSIS — J301 Allergic rhinitis due to pollen: Secondary | ICD-10-CM | POA: Diagnosis not present

## 2019-11-06 DIAGNOSIS — J3089 Other allergic rhinitis: Secondary | ICD-10-CM | POA: Diagnosis not present

## 2019-11-14 DIAGNOSIS — J301 Allergic rhinitis due to pollen: Secondary | ICD-10-CM | POA: Diagnosis not present

## 2019-11-14 DIAGNOSIS — J3089 Other allergic rhinitis: Secondary | ICD-10-CM | POA: Diagnosis not present

## 2019-11-16 DIAGNOSIS — J301 Allergic rhinitis due to pollen: Secondary | ICD-10-CM | POA: Diagnosis not present

## 2019-11-16 DIAGNOSIS — J3089 Other allergic rhinitis: Secondary | ICD-10-CM | POA: Diagnosis not present

## 2019-11-20 DIAGNOSIS — J3089 Other allergic rhinitis: Secondary | ICD-10-CM | POA: Diagnosis not present

## 2019-11-20 DIAGNOSIS — J301 Allergic rhinitis due to pollen: Secondary | ICD-10-CM | POA: Diagnosis not present

## 2019-11-28 DIAGNOSIS — J301 Allergic rhinitis due to pollen: Secondary | ICD-10-CM | POA: Diagnosis not present

## 2019-11-28 DIAGNOSIS — J3089 Other allergic rhinitis: Secondary | ICD-10-CM | POA: Diagnosis not present

## 2019-12-05 DIAGNOSIS — J3089 Other allergic rhinitis: Secondary | ICD-10-CM | POA: Diagnosis not present

## 2019-12-05 DIAGNOSIS — J301 Allergic rhinitis due to pollen: Secondary | ICD-10-CM | POA: Diagnosis not present

## 2019-12-12 DIAGNOSIS — J3089 Other allergic rhinitis: Secondary | ICD-10-CM | POA: Diagnosis not present

## 2019-12-12 DIAGNOSIS — J301 Allergic rhinitis due to pollen: Secondary | ICD-10-CM | POA: Diagnosis not present

## 2019-12-19 DIAGNOSIS — J3089 Other allergic rhinitis: Secondary | ICD-10-CM | POA: Diagnosis not present

## 2019-12-19 DIAGNOSIS — J301 Allergic rhinitis due to pollen: Secondary | ICD-10-CM | POA: Diagnosis not present

## 2019-12-25 DIAGNOSIS — J3089 Other allergic rhinitis: Secondary | ICD-10-CM | POA: Diagnosis not present

## 2019-12-25 DIAGNOSIS — J301 Allergic rhinitis due to pollen: Secondary | ICD-10-CM | POA: Diagnosis not present

## 2019-12-26 DIAGNOSIS — R69 Illness, unspecified: Secondary | ICD-10-CM | POA: Diagnosis not present

## 2019-12-26 NOTE — Progress Notes (Signed)
COMPLETE PHYSICAL Assessment:   Essential hypertension - continue medications, DASH diet, exercise and monitor at home. Call if greater than 130/80.  -     CBC with Differential/Platelet -     COMPLETE METABOLIC PANEL WITH GFR -     TSH   Type 2 diabetes mellitus without complication, without long-term current use of insulin ([place]) Discussed general issues about diabetes pathophysiology and management., Educational material distributed., Suggested low cholesterol diet., Encouraged aerobic exercise., Discussed foot care., Reminded to get yearly retinal exam. -     Hemoglobin A1c  Mixed hyperlipidemia check lipids decrease fatty foods increase activity. -     Lipid panel  Medication management -     Magnesium  Vitamin D deficiency -     VITAMIN D 25 Hydroxy (Vit-D Deficiency, Fractures)  Age related osteoporosis, unspecified pathological fracture presence - on fosamax x 06/2017- will stop due to jaw pain- recheck DEXA 2021  Morbid Obesity - follow up 3 months for progress monitoring - increase veggies, decrease carbs - long discussion about weight loss, diet, and exercise  Right-sided low back pain without sciatica, unspecified chronicity - continue ortho follow up Can check out accuptunture  Gastroesophageal reflux disease, esophagitis presence not specified Continue PPI/H2 blocker, diet discussed  Uncomplicated asthma, unspecified asthma severity, unspecified whether persistent Monitor  Arthralgia of left temporomandibular joint information given to the patient, no gum/decrease hard foods, warm wet wash clothes, decrease stress,  can do massage, and exercise.  Very important to follow up with dentist Will stop fosamax x 6 months, had xray at dentist  Hot flashes -     PARoxetine (PAXIL) 10 MG tablet; Take 1 tablet (10 mg total) by mouth at bedtime.    Over 40 minutes of exam, counseling, chart review, and critical decision making was performed  Future  Appointments  Date Time Provider [place]  05/01/2020  3:00 PM Vicie Mutters, PA-C [facility] None  12/27/2020  9:30 AM Vicie Mutters, PA-C [facility] None     Subjective:  Lauren Lopez is a 69 y.o. AA female who presents for cpe and 3 month follow up for HTN, hyperlipidemia, prediabetes, and vitamin D Def.   She has been following with ortho for her back pain, following with Dr. Ernestina Patches.   She had negative PTH. She had negative RA workup in 2017. She has been following with her dentist for left ear pain/swelling/TMJ, had mouth guard that is not helping.  She is on fosamax  Her blood pressure has been controlled at home, today their BP is BP: 126/84 She does workout.  She denies chest pain, shortness of breath, dizziness.  BMI is Body mass index is 33.6 kg/m., she is working on diet and exercise. Wt Readings from Last 3 Encounters:  12/28/19 198 lb 12.8 oz (90.2 kg)  08/09/19 200 lb 6.4 oz (90.9 kg)  04/26/19 200 lb (90.7 kg)   Husband has leukemia, they are monitoring it, he is having dementia as well.   She also has hot flashes, could not tolerate the lexapro, states OTC meds is not helping.   She is on cholesterol medication and denies myalgias. Her cholesterol is at goal. The cholesterol last visit was:   Lab Results  Component Value Date   CHOL 188 08/09/2019   HDL 75 08/09/2019   LDLCALC 98 08/09/2019   TRIG 65 08/09/2019   CHOLHDL 2.5 08/09/2019   She has been working on diet and exercise for diabetes with CKD, controlled with diet and  metformin, she has worked hard to get it down, and denies foot ulcerations, hyperglycemia, hypoglycemia , increased appetite, nausea, paresthesia of the feet, polydipsia, polyuria, vomiting and weight loss. Last A1C in the office was:  Lab Results  Component Value Date   HGBA1C 6.4 (H) 08/09/2019   Last GFR` Lab Results  Component Value Date   GFRNONAA 73 08/09/2019   She is on fosamax for osteporosis x 06/2017. Started  on calcium which helped the right leg pain.   Patient is on Vitamin D supplement, 5000 IU 2 a day but now at one a day.  Lab Results  Component Value Date   VD25OH 65 08/09/2019    Medication Review:  Current Outpatient Medications (Endocrine & Metabolic):  .  alendronate (FOSAMAX) 70 MG tablet, Take 1 tablet on an Empty Stomach with a full glass of water - every 7 days for Osteoporosis .  metFORMIN (GLUCOPHAGE) 500 MG tablet, Take 1 tablet  3 x /day with Meals for Diabetes  Current Outpatient Medications (Cardiovascular):  .  hydrochlorothiazide (HYDRODIURIL) 25 MG tablet, TAKE 1 TABLET BY MOUTH EVERY DAY  Current Outpatient Medications (Respiratory):  .  fexofenadine (ALLEGRA) 180 MG tablet, Take 1 tablet (180 mg total) by mouth daily. .  fluticasone-salmeterol (ADVAIR HFA) 115-21 MCG/ACT inhaler, Inhale 2 puffs into the lungs 2 (two) times daily.  Current Outpatient Medications (Analgesics):  .  meloxicam (MOBIC) 15 MG tablet, TAKE 1/2 TO 1 TABLET DAILY WITH FOOD FOR PAIN & INFLAMMATION AND LIMIT TO 5 DAYS/WEEK TO AVOID KIDNEY DAMAGE  Current Outpatient Medications (Hematological):  .  cyanocobalamin 100 MCG tablet, Take 100 mcg by mouth daily.  Current Outpatient Medications (Other):  .  Apoaequorin (PREVAGEN PO), Take by mouth. .  blood glucose meter kit and supplies, Test sugars twice daily. Dx E11.9 .  calcium carbonate (CALCIUM 600) 600 MG TABS tablet, Take 600 mg by mouth daily. .  Cholecalciferol (VITAMIN D3) 5000 UNITS TABS, Take 5,000 Units by mouth daily. .  cyclobenzaprine (FLEXERIL) 10 MG tablet, Take 1 tablet (10 mg total) by mouth at bedtime as needed for muscle spasms. .  Glucosamine-Chondroitin (MOVE FREE PO), Take by mouth. .  Lancets (ONETOUCH DELICA PLUS [id number]) MISC, TEST SUGAR TWICE A DAY .  MAGNESIUM PO, Take 500 mg by mouth daily.  Marland Kitchen  omeprazole (PRILOSEC) 40 MG capsule, Take 1 capsule Daily for Indigestion & Heartburn .  ONETOUCH ULTRA test strip,  TEST SUGAR TWICE A DAY .  OVER THE COUNTER MEDICATION,  .  Red Yeast Rice Extract (RED YEAST RICE PO), Take 2,000 mg by mouth daily.  Allergies: Allergies  Allergen Reactions  . Metronidazole Other (See Comments)    unknown  . Pneumovax 23 [Pneumococcal Vac Polyvalent]     Redness and severe swelling at injection site  . Ppd [Tuberculin Purified Protein Derivative] Other (See Comments)    + PPD 2013 with NEG CXR 05/2012    Current Problems (verified) has Essential hypertension; GERD (gastroesophageal reflux disease); Mixed hyperlipidemia; Vitamin D deficiency; Asthma; T2_NIDDM; Medication management; Back pain; Osteoporosis; and Obesity (BMI 30.0-34.9) on their problem list.  Screening Tests Immunization History  Administered Date(s) Administered  . Influenza, High Dose Seasonal PF 11/10/2017, 09/15/2018, 09/04/2019  . Influenza, Seasonal, Injecte, Preservative Fre 10/17/2015  . Influenza,inj,quad, With Preservative 10/15/2016  . Pneumococcal Conjugate-13 12/22/2018  . Pneumococcal Polysaccharide-23 02/04/2017  . Pneumococcal-Unspecified 06/27/2013  . Tdap 04/23/2012  . Zoster 07/31/2015    Preventative care: Last colonoscopy: 2019 had 6 polyps, 5  years Dr. Earlean Shawl Pap: Hysterectomy Mammogram:  07/2019 goes to [facility] DEXA: 07/2019 osteopenia, at [facility] started fosamax 06/2017 CXR 03/2016  Names of Other Physician/Practitioners you currently use: 1. [facility] here for primary care 2. Dr. Katy Fitch, eye doctor, last visit 2019 DUE this year, will see in August 3. Dr. Mariea Clonts  , dentist, last visit 2018 Patient Care Team: Unk Pinto, MD as PCP - General (Internal Medicine) Sypher, Herbie Baltimore, MD (Inactive) as Consulting Physician (Orthopedic Surgery) Monna Fam, MD as Consulting Physician (Ophthalmology) Richmond Campbell, MD as Consulting Physician (Gastroenterology) Cheri Fowler, MD as Consulting Physician (Obstetrics and  Gynecology) Gaynelle Arabian, MD as Consulting Physician (Orthopedic Surgery) Marchia Bond, MD as Consulting Physician (Orthopedic Surgery) Mosetta Anis, MD as Referring Physician (Allergy) Larey Dresser, MD as Consulting Physician (Cardiology)  Surgical: She  has a past surgical history that includes Bilateral salpingoophorectomy (11/28/2010); Knee arthroscopy (Bilateral, 2005-2016); Colonoscopy; Carpal tunnel release (07/15/2012); Shoulder arthroscopy with rotator cuff repair and subacromial decompression (Right, 01/20/2013); Abdominal hysterectomy; Carpal tunnel release (Right, 05/31/2014); and Tubal ligation. Family Her family history includes Diabetes in her brother and mother; Heart disease in her brother; Hyperlipidemia in her brother; Hypertension in her father and mother; Stroke in her mother. Social history  She reports that she has never smoked. She has never used smokeless tobacco. She reports that she does not drink alcohol or use drugs.   Objective:   Today's Vitals   12/28/19 1011  BP: 126/84  Pulse: 84  Temp: 97.6 F (36.4 C)  SpO2: 97%  Weight: 198 lb 12.8 oz (90.2 kg)  Height: 5' 4.5" (1.638 m)   Body mass index is 33.6 kg/m.  General appearance: alert, no distress, WD/WN, female HEENT: normocephalic, sclerae anicteric, TMs pearly, nares patent, no discharge or erythema, pharynx normal Oral cavity: MMM, no lesions Neck: supple, no lymphadenopathy, no thyromegaly, no masses Heart: RRR, normal S1, S2, no murmurs Lungs: CTA bilaterally, no wheezes, rhonchi, or rales Abdomen: +bs, soft, non tender, non distended, no masses, no hepatomegaly, no splenomegaly Musculoskeletal: nontender, no swelling, no obvious deformity, Patient is able to ambulate well. Gait is  Antalgic. Straight leg raising with dorsiflexion negative bilaterally for radicular symptoms. Sensory exam in the legs are normal. Knee reflexes are normal Ankle reflexes are normal Strength is normal and  symmetric in arms and legs. There is not SI tenderness to palpation.  There is paraspinal muscle spasm.  There is not midline tenderness.  ROM of spine with  limited in all spheres due to pain.  Extremities: no edema, no cyanosis, no clubbing Pulses: 2+ symmetric, upper and lower extremities, normal cap refill Neurological: alert, oriented x 3, CN2-12 intact, strength normal upper extremities and lower extremities, sensation normal throughout, DTRs 2+ throughout, no cerebellar signs, gait normal Psychiatric: normal affect, behavior normal, pleasant   Diabetic Foot Exam - Simple   No data filed        Vicie Mutters, PA-C   12/28/2019

## 2019-12-28 ENCOUNTER — Encounter: Payer: Self-pay | Admitting: Physician Assistant

## 2019-12-28 ENCOUNTER — Other Ambulatory Visit: Payer: Self-pay

## 2019-12-28 ENCOUNTER — Ambulatory Visit (INDEPENDENT_AMBULATORY_CARE_PROVIDER_SITE_OTHER): Payer: Medicare HMO | Admitting: Physician Assistant

## 2019-12-28 VITALS — BP 126/84 | HR 84 | Temp 97.6°F | Ht 64.5 in | Wt 198.8 lb

## 2019-12-28 DIAGNOSIS — J45909 Unspecified asthma, uncomplicated: Secondary | ICD-10-CM | POA: Diagnosis not present

## 2019-12-28 DIAGNOSIS — M545 Low back pain, unspecified: Secondary | ICD-10-CM

## 2019-12-28 DIAGNOSIS — Z79899 Other long term (current) drug therapy: Secondary | ICD-10-CM

## 2019-12-28 DIAGNOSIS — I10 Essential (primary) hypertension: Secondary | ICD-10-CM

## 2019-12-28 DIAGNOSIS — E669 Obesity, unspecified: Secondary | ICD-10-CM | POA: Diagnosis not present

## 2019-12-28 DIAGNOSIS — K219 Gastro-esophageal reflux disease without esophagitis: Secondary | ICD-10-CM | POA: Diagnosis not present

## 2019-12-28 DIAGNOSIS — E782 Mixed hyperlipidemia: Secondary | ICD-10-CM

## 2019-12-28 DIAGNOSIS — E559 Vitamin D deficiency, unspecified: Secondary | ICD-10-CM | POA: Diagnosis not present

## 2019-12-28 DIAGNOSIS — R232 Flushing: Secondary | ICD-10-CM

## 2019-12-28 DIAGNOSIS — E119 Type 2 diabetes mellitus without complications: Secondary | ICD-10-CM

## 2019-12-28 DIAGNOSIS — M810 Age-related osteoporosis without current pathological fracture: Secondary | ICD-10-CM

## 2019-12-28 DIAGNOSIS — M26622 Arthralgia of left temporomandibular joint: Secondary | ICD-10-CM

## 2019-12-28 DIAGNOSIS — Z0001 Encounter for general adult medical examination with abnormal findings: Secondary | ICD-10-CM

## 2019-12-28 MED ORDER — PAROXETINE HCL 10 MG PO TABS: 10.0000 mg | ORAL_TABLET | Freq: Every day | ORAL | 2 refills | Status: DC

## 2019-12-28 NOTE — Patient Instructions (Addendum)
Stop the fosamax x 6 months and we will see how the jaw pain does  Can call your insurance to see if they cover accupunture Here are two places in [place] that are good [facility]  [address]   [phone]  [place]   Acupuncture clinic  [place]   [phone]   Can refer to podiatry  Can refer to PT for your TMJ when the pandemic improves or 2 weeks after your 2nd.    What is the TMJ? The temporomandibular (tem-PUH-ro-man-DIB-yoo-ler) joint, or the TMJ, connects the upper and lower jawbones. This joint allows the jaw to open wide and move back and forth when you chew, talk, or yawn.There are also several muscles that help this joint move. There can be muscle tightness and pain in the muscle that can cause several symptoms.  What causes TMJ pain? There are many causes of TMJ pain. Repeated chewing (for example, chewing gum) and clenching your teeth can cause pain in the joint. Some TMJ pain has no obvious cause. What can I do to ease the pain? There are many things you can do to help your pain get better. When you have pain:  Eat soft foods and stay away from chewy foods (for example, taffy) Try to use both sides of your mouth to chew Don't chew gum Massage Don't open your mouth wide (for example, during yawning or singing) Don't bite your cheeks or fingernails Lower your amount of stress and worry Applying a warm, damp washcloth to the joint may help. Over-the-counter pain medicines such as ibuprofen (one brand: Advil) or acetaminophen (one brand: Tylenol) might also help. Do not use these medicines if you are allergic to them or if your doctor told you not to use them. How can I stop the pain from coming back? When your pain is better, you can do these exercises to make your muscles stronger and to keep the pain from coming back:  Resisted mouth opening: Place your thumb or two fingers under your chin and  open your mouth slowly, pushing up lightly on your chin with your thumb. Hold for three to six seconds. Close your mouth slowly. Resisted mouth closing: Place your thumbs under your chin and your two index fingers on the ridge between your mouth and the bottom of your chin. Push down lightly on your chin as you close your mouth. Tongue up: Slowly open and close your mouth while keeping the tongue touching the roof of the mouth. Side-to-side jaw movement: Place an object about one fourth of an inch thick (for example, two tongue depressors) between your front teeth. Slowly move your jaw from side to side. Increase the thickness of the object as the exercise becomes easier Forward jaw movement: Place an object about one fourth of an inch thick between your front teeth and move the bottom jaw forward so that the bottom teeth are in front of the top teeth. Increase the thickness of the object as the exercise becomes easier. These exercises should not be painful. If it hurts to do these exercises, stop doing them and talk to your family doctor.

## 2019-12-29 LAB — LIPID PANEL
Cholesterol: 214 mg/dL — ABNORMAL HIGH (ref <200)
HDL: 86 mg/dL (ref >=50)
LDL Cholesterol (Calc): 111 mg/dL (calc) — ABNORMAL HIGH
Non-HDL Cholesterol (Calc): 128 mg/dL (calc) (ref <130)
Total CHOL/HDL Ratio: 2.5 (calc) (ref <5.0)
Triglycerides: 81 mg/dL (ref <150)

## 2019-12-29 LAB — COMPLETE METABOLIC PANEL WITH GFR
AG Ratio: 1.7 (calc) (ref 1.0–2.5)
ALT: 14 U/L (ref 6–29)
AST: 16 U/L (ref 10–35)
Albumin: 4.3 g/dL (ref 3.6–5.1)
Alkaline phosphatase (APISO): 81 U/L (ref 37–153)
BUN: 15 mg/dL (ref 7–25)
CO2: 28 mmol/L (ref 20–32)
Calcium: 9.5 mg/dL (ref 8.6–10.4)
Chloride: 102 mmol/L (ref 98–110)
Creat: 0.82 mg/dL (ref 0.50–0.99)
GFR, Est African American: 85 mL/min/{1.73_m2} (ref >=60)
GFR, Est Non African American: 74 mL/min/{1.73_m2} (ref >=60)
Globulin: 2.6 g/dL (calc) (ref 1.9–3.7)
Glucose, Bld: 110 mg/dL — ABNORMAL HIGH (ref 65–99)
Potassium: 4 mmol/L (ref 3.5–5.3)
Sodium: 139 mmol/L (ref 135–146)
Total Bilirubin: 0.5 mg/dL (ref 0.2–1.2)
Total Protein: 6.9 g/dL (ref 6.1–8.1)

## 2019-12-29 LAB — CBC WITH DIFFERENTIAL/PLATELET
Absolute Monocytes: 274 cells/uL (ref 200–950)
Basophils Absolute: 29 cells/uL (ref 0–200)
Basophils Relative: 0.5 %
Eosinophils Absolute: 108 cells/uL (ref 15–500)
Eosinophils Relative: 1.9 %
HCT: 38.6 % (ref 35.0–45.0)
Hemoglobin: 12.1 g/dL (ref 11.7–15.5)
Lymphs Abs: 3107 cells/uL (ref 850–3900)
MCH: 26.7 pg — ABNORMAL LOW (ref 27.0–33.0)
MCHC: 31.3 g/dL — ABNORMAL LOW (ref 32.0–36.0)
MCV: 85.2 fL (ref 80.0–100.0)
MPV: 12.1 fL (ref 7.5–12.5)
Monocytes Relative: 4.8 %
Neutro Abs: 2183 cells/uL (ref 1500–7800)
Neutrophils Relative %: 38.3 %
Platelets: 228 10*3/uL (ref 140–400)
RBC: 4.53 10*6/uL (ref 3.80–5.10)
RDW: 13.5 % (ref 11.0–15.0)
Total Lymphocyte: 54.5 %
WBC: 5.7 10*3/uL (ref 3.8–10.8)

## 2019-12-29 LAB — URINALYSIS, ROUTINE W REFLEX MICROSCOPIC
Bilirubin Urine: NEGATIVE
Glucose, UA: NEGATIVE
Hgb urine dipstick: NEGATIVE
Ketones, ur: NEGATIVE
Leukocytes,Ua: NEGATIVE
Nitrite: NEGATIVE
Protein, ur: NEGATIVE
Specific Gravity, Urine: 1.019 (ref 1.001–1.03)
pH: 6.5 (ref 5.0–8.0)

## 2019-12-29 LAB — HEMOGLOBIN A1C
Hgb A1c MFr Bld: 6.4 % of total Hgb — ABNORMAL HIGH (ref <5.7)
Mean Plasma Glucose: 137 (calc)
eAG (mmol/L): 7.6 (calc)

## 2019-12-29 LAB — TSH: TSH: 1.81 mIU/L (ref 0.40–4.50)

## 2019-12-29 LAB — VITAMIN D 25 HYDROXY (VIT D DEFICIENCY, FRACTURES): Vit D, 25-Hydroxy: 56 ng/mL (ref 30–100)

## 2019-12-29 LAB — MICROALBUMIN / CREATININE URINE RATIO
Creatinine, Urine: 136 mg/dL (ref 20–275)
Microalb Creat Ratio: 2 mcg/mg creat (ref <30)
Microalb, Ur: 0.3 mg/dL

## 2019-12-29 LAB — MAGNESIUM: Magnesium: 1.7 mg/dL (ref 1.5–2.5)

## 2020-01-02 DIAGNOSIS — J3089 Other allergic rhinitis: Secondary | ICD-10-CM | POA: Diagnosis not present

## 2020-01-02 DIAGNOSIS — J301 Allergic rhinitis due to pollen: Secondary | ICD-10-CM | POA: Diagnosis not present

## 2020-01-08 DIAGNOSIS — J3089 Other allergic rhinitis: Secondary | ICD-10-CM | POA: Diagnosis not present

## 2020-01-08 DIAGNOSIS — J301 Allergic rhinitis due to pollen: Secondary | ICD-10-CM | POA: Diagnosis not present

## 2020-01-16 DIAGNOSIS — J3089 Other allergic rhinitis: Secondary | ICD-10-CM | POA: Diagnosis not present

## 2020-01-19 ENCOUNTER — Other Ambulatory Visit: Payer: Self-pay | Admitting: Physician Assistant

## 2020-01-23 DIAGNOSIS — J3089 Other allergic rhinitis: Secondary | ICD-10-CM | POA: Diagnosis not present

## 2020-01-23 DIAGNOSIS — J301 Allergic rhinitis due to pollen: Secondary | ICD-10-CM | POA: Diagnosis not present

## 2020-01-31 DIAGNOSIS — J301 Allergic rhinitis due to pollen: Secondary | ICD-10-CM | POA: Diagnosis not present

## 2020-01-31 DIAGNOSIS — J3089 Other allergic rhinitis: Secondary | ICD-10-CM | POA: Diagnosis not present

## 2020-02-06 DIAGNOSIS — J3089 Other allergic rhinitis: Secondary | ICD-10-CM | POA: Diagnosis not present

## 2020-02-06 DIAGNOSIS — J301 Allergic rhinitis due to pollen: Secondary | ICD-10-CM | POA: Diagnosis not present

## 2020-02-07 DIAGNOSIS — M25561 Pain in right knee: Secondary | ICD-10-CM | POA: Diagnosis not present

## 2020-02-07 DIAGNOSIS — M25562 Pain in left knee: Secondary | ICD-10-CM | POA: Diagnosis not present

## 2020-02-07 DIAGNOSIS — M170 Bilateral primary osteoarthritis of knee: Secondary | ICD-10-CM | POA: Diagnosis not present

## 2020-02-12 ENCOUNTER — Other Ambulatory Visit: Payer: Self-pay | Admitting: Adult Health

## 2020-02-12 DIAGNOSIS — J301 Allergic rhinitis due to pollen: Secondary | ICD-10-CM | POA: Diagnosis not present

## 2020-02-12 DIAGNOSIS — J3089 Other allergic rhinitis: Secondary | ICD-10-CM | POA: Diagnosis not present

## 2020-02-19 DIAGNOSIS — J301 Allergic rhinitis due to pollen: Secondary | ICD-10-CM | POA: Diagnosis not present

## 2020-02-19 DIAGNOSIS — J3089 Other allergic rhinitis: Secondary | ICD-10-CM | POA: Diagnosis not present

## 2020-02-27 DIAGNOSIS — J301 Allergic rhinitis due to pollen: Secondary | ICD-10-CM | POA: Diagnosis not present

## 2020-02-27 DIAGNOSIS — J3089 Other allergic rhinitis: Secondary | ICD-10-CM | POA: Diagnosis not present

## 2020-03-06 DIAGNOSIS — J301 Allergic rhinitis due to pollen: Secondary | ICD-10-CM | POA: Diagnosis not present

## 2020-03-06 DIAGNOSIS — J3089 Other allergic rhinitis: Secondary | ICD-10-CM | POA: Diagnosis not present

## 2020-03-12 DIAGNOSIS — J3089 Other allergic rhinitis: Secondary | ICD-10-CM | POA: Diagnosis not present

## 2020-03-12 DIAGNOSIS — J301 Allergic rhinitis due to pollen: Secondary | ICD-10-CM | POA: Diagnosis not present

## 2020-03-19 DIAGNOSIS — J3089 Other allergic rhinitis: Secondary | ICD-10-CM | POA: Diagnosis not present

## 2020-03-19 DIAGNOSIS — J301 Allergic rhinitis due to pollen: Secondary | ICD-10-CM | POA: Diagnosis not present

## 2020-03-20 DIAGNOSIS — I10 Essential (primary) hypertension: Secondary | ICD-10-CM | POA: Diagnosis not present

## 2020-03-20 DIAGNOSIS — M810 Age-related osteoporosis without current pathological fracture: Secondary | ICD-10-CM | POA: Diagnosis not present

## 2020-03-20 DIAGNOSIS — Z008 Encounter for other general examination: Secondary | ICD-10-CM | POA: Diagnosis not present

## 2020-03-20 DIAGNOSIS — Z780 Asymptomatic menopausal state: Secondary | ICD-10-CM | POA: Diagnosis not present

## 2020-03-20 DIAGNOSIS — K219 Gastro-esophageal reflux disease without esophagitis: Secondary | ICD-10-CM | POA: Diagnosis not present

## 2020-03-20 DIAGNOSIS — Z6834 Body mass index (BMI) 34.0-34.9, adult: Secondary | ICD-10-CM | POA: Diagnosis not present

## 2020-03-20 DIAGNOSIS — J45909 Unspecified asthma, uncomplicated: Secondary | ICD-10-CM | POA: Diagnosis not present

## 2020-03-20 DIAGNOSIS — E669 Obesity, unspecified: Secondary | ICD-10-CM | POA: Diagnosis not present

## 2020-03-20 DIAGNOSIS — E119 Type 2 diabetes mellitus without complications: Secondary | ICD-10-CM | POA: Diagnosis not present

## 2020-03-20 DIAGNOSIS — M1990 Unspecified osteoarthritis, unspecified site: Secondary | ICD-10-CM | POA: Diagnosis not present

## 2020-03-20 DIAGNOSIS — Z791 Long term (current) use of non-steroidal anti-inflammatories (NSAID): Secondary | ICD-10-CM | POA: Diagnosis not present

## 2020-03-26 DIAGNOSIS — J3089 Other allergic rhinitis: Secondary | ICD-10-CM | POA: Diagnosis not present

## 2020-03-26 DIAGNOSIS — J301 Allergic rhinitis due to pollen: Secondary | ICD-10-CM | POA: Diagnosis not present

## 2020-04-01 DIAGNOSIS — J301 Allergic rhinitis due to pollen: Secondary | ICD-10-CM | POA: Diagnosis not present

## 2020-04-10 DIAGNOSIS — J301 Allergic rhinitis due to pollen: Secondary | ICD-10-CM | POA: Diagnosis not present

## 2020-04-10 DIAGNOSIS — J3089 Other allergic rhinitis: Secondary | ICD-10-CM | POA: Diagnosis not present

## 2020-04-17 DIAGNOSIS — J3089 Other allergic rhinitis: Secondary | ICD-10-CM | POA: Diagnosis not present

## 2020-04-17 DIAGNOSIS — J301 Allergic rhinitis due to pollen: Secondary | ICD-10-CM | POA: Diagnosis not present

## 2020-04-19 DIAGNOSIS — J301 Allergic rhinitis due to pollen: Secondary | ICD-10-CM | POA: Diagnosis not present

## 2020-04-19 DIAGNOSIS — J3089 Other allergic rhinitis: Secondary | ICD-10-CM | POA: Diagnosis not present

## 2020-04-22 DIAGNOSIS — J3089 Other allergic rhinitis: Secondary | ICD-10-CM | POA: Diagnosis not present

## 2020-04-22 DIAGNOSIS — J301 Allergic rhinitis due to pollen: Secondary | ICD-10-CM | POA: Diagnosis not present

## 2020-04-26 DIAGNOSIS — J3089 Other allergic rhinitis: Secondary | ICD-10-CM | POA: Diagnosis not present

## 2020-04-26 DIAGNOSIS — J301 Allergic rhinitis due to pollen: Secondary | ICD-10-CM | POA: Diagnosis not present

## 2020-04-26 DIAGNOSIS — J4530 Mild persistent asthma, uncomplicated: Secondary | ICD-10-CM | POA: Diagnosis not present

## 2020-04-26 DIAGNOSIS — H1045 Other chronic allergic conjunctivitis: Secondary | ICD-10-CM | POA: Diagnosis not present

## 2020-04-30 DIAGNOSIS — E1122 Type 2 diabetes mellitus with diabetic chronic kidney disease: Secondary | ICD-10-CM | POA: Insufficient documentation

## 2020-04-30 DIAGNOSIS — N182 Chronic kidney disease, stage 2 (mild): Secondary | ICD-10-CM | POA: Insufficient documentation

## 2020-04-30 DIAGNOSIS — J301 Allergic rhinitis due to pollen: Secondary | ICD-10-CM | POA: Diagnosis not present

## 2020-04-30 DIAGNOSIS — E1169 Type 2 diabetes mellitus with other specified complication: Secondary | ICD-10-CM | POA: Insufficient documentation

## 2020-04-30 DIAGNOSIS — E785 Hyperlipidemia, unspecified: Secondary | ICD-10-CM | POA: Insufficient documentation

## 2020-04-30 DIAGNOSIS — R69 Illness, unspecified: Secondary | ICD-10-CM | POA: Diagnosis not present

## 2020-04-30 DIAGNOSIS — J3089 Other allergic rhinitis: Secondary | ICD-10-CM | POA: Diagnosis not present

## 2020-04-30 NOTE — Progress Notes (Signed)
WELLNESS Assessment:   Encounter for Medicare annual wellness exam 1 YEAR  Type 2 diabetes mellitus with hyperlipidemia ([name]) -     Hemoglobin A1c Discussed general issues about diabetes pathophysiology and management., Educational material distributed., Suggested low cholesterol diet., Encouraged aerobic exercise., Discussed foot care., Reminded to get yearly retinal exam.  Hyperlipidemia associated with type 2 diabetes mellitus ([name]) -     Lipid panel check lipids decrease fatty foods increase activity.   CKD stage 2 due to type 2 diabetes mellitus ([name]) -     Hemoglobin A1c Increase fluids, avoid NSAIDS, monitor sugars, will monitor  Type 2 diabetes mellitus with stage 2 chronic kidney disease, without long-term current use of insulin ([name]) -     Hemoglobin A1c  Obesity (BMI 30.0-34.9) - follow up 3 months for progress monitoring - increase veggies, decrease carbs - long discussion about weight loss, diet, and exercise  Lipoma of right upper extremity No numbness tingling in arm but patient is having some restriction with movement due to size, will refer to gen surgery due to location and size when she wants.   Thyroid nodule -     US THYROID; Future  Sore throat- normal exam, no lymphadenopathy Will get [name] of thyroid since larger left thyroid nodule, if negative will refer to ENT for eval Will use nasal spray and pepcid to rule out LPR/sinus -     US THYROID; Future  Essential hypertension - continue medications, DASH diet, exercise and monitor at home. Call if greater than 130/80.  -     CBC with Differential/Platelet -     COMPLETE METABOLIC PANEL WITH GFR -     TSH   Medication management -     Magnesium  Vitamin D deficiency -     VITAMIN D 25 Hydroxy (Vit-D Deficiency, Fractures)  Age related osteoporosis, unspecified pathological fracture presence - on fosamax x 06/2017- will stop due to jaw pain- recheck DEXA 2021   Obesity BMI 34 - follow up 3 months  for progress monitoring - increase veggies, decrease carbs - long discussion about weight loss, diet, and exercise  Right-sided low back pain without sciatica, unspecified chronicity - continue ortho follow up  Gastroesophageal reflux disease, esophagitis presence not specified Continue PPI/H2 blocker, diet discussed  Uncomplicated asthma, unspecified asthma severity, unspecified whether persistent Monitor  Hot flashes -     PARoxetine (PAXIL) 10 MG tablet; Take 1 tablet (10 mg total) by mouth at bedtime. - continue medication   Over 40 minutes of exam, counseling, chart review, and critical decision making was performed  Future Appointments  Date Time Provider [place]  05/16/2020  1:00 PM [facility] [name] [facility]  08/01/2020 10:30 AM Vicie Mutters, PA-C [facility] None  12/27/2020  9:30 AM Vicie Mutters, PA-C [facility] None    Medicare Attestation I have personally reviewed: The patient's medical and social history Their use of alcohol, tobacco or illicit drugs Their current medications and supplements The patient's functional ability including ADLs,fall risks, home safety risks, cognitive, and hearing and visual impairment Diet and physical activities Evidence for depression or mood disorders  The patient's weight, height, BMI, and visual acuity have been recorded in the chart.  I have made referrals, counseling, and provided education to the patient based on review of the above and I have provided the patient with a written personalized care plan for preventive services.     MEDICARE WELLNESS OBJECTIVES: Physical activity: Current Exercise Habits: Structured exercise class, Type of  exercise: walking(going to gym), Time (Minutes): 20, Frequency (Times/Week): 6, Weekly Exercise (Minutes/Week): 120, Intensity: Mild Cardiac risk factors: Cardiac Risk Factors include: advanced age (>21mn, >>70women);diabetes mellitus;dyslipidemia;hypertension;obesity (BMI  >30kg/m2);family history of premature cardiovascular disease Depression/mood screen:   [facility] 08/11/2019  Decreased Interest 0  Down, Depressed, Hopeless 0  PHQ - 2 Score 0    ADLs:  In your present state of health, do you have any difficulty performing the following activities: 05/01/2020  Hearing? N  Vision? N  Difficulty concentrating or making decisions? N  Walking or climbing stairs? N  Dressing or bathing? N  Doing errands, shopping? N  Some recent data might be hidden     Cognitive Testing  Alert? Yes  Normal Appearance?Yes  Oriented to person? Yes  Place? Yes   Time? Yes  Recall of three objects?  Yes  Can perform simple calculations? Yes  Displays appropriate judgment?Yes  Can read the correct time from a watch face?Yes  EOL planning: Does Patient Have a Medical Advance Directive?: Yes Type of Advance Directive: Healthcare Power of Attorney, Living will Copy of [place] Chart?: No - copy requested  Subjective:  Lauren LUTEis a 69y.o. AA female who presents for medicare wellness and 3 month follow up for HTN, hyperlipidemia, prediabetes, and vitamin D Def.   She states she has been having feeling of something in her left side of throat for 3 months, intermittent. Has allergies shots.  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  She had negative PTH. She had negative RA workup in 2017. She is on fosamax for osteporosis x 06/2017, stopped last visit due to jaw pain, jaw pain got better- Dexa 2020 showed osteopenia, will repeat this year and decide if she should try to restart fosamax versus send for prolia.   Her blood pressure has been controlled at home, today their BP is BP: 126/80 She does workout, back at the gym, weights and walking.  She denies chest pain, shortness of breath, dizziness.  BMI is Body mass index is 33.13  kg/m., she is working on diet and exercise. Wt Readings from Last 3 Encounters:  05/01/20 193 lb (87.5 kg)  12/28/19 198 lb 12.8 oz (90.2 kg)  08/09/19 200 lb 6.4 oz (90.9 kg)   Husband has leukemia, they are monitoring it, he is having dementia as well.   She also has hot flashes, could not tolerate the lexapro, started on low dose paxil last visit.  States helps some with hot flashes but feels that she is sleeping better and is more calm and feeling less anxious.   She has been working on diet and exercise for diabetes  with CKD not on ACE or ARB With hyperlipidemia not at goal less than [id number] Dr. GKaty Fitch08/2020  controlled with diet and metformin  she has worked hard to get it down and denies foot ulcerations, hyperglycemia, hypoglycemia , increased appetite, nausea, paresthesia of the feet, polydipsia, polyuria, vomiting and weight loss.  Last A1C in the office was:  Lab Results  Component Value Date   HGBA1C 6.0 (H) 05/01/2020   Last GFR` Lab Results  Component Value Date   GFRNONAA 62 05/01/2020   Lab Results  Component Value Date   CHOL 219 (H) 05/01/2020   HDL 86 05/01/2020   LDLCALC 113 (H) 05/01/2020  TRIG 92 05/01/2020   CHOLHDL 2.5 05/01/2020   Patient is on Vitamin D supplement, 5000 IU 2 a day but now at one a day.  Lab Results  Component Value Date   VD25OH 56 12/28/2019    Medication Review:  Current Outpatient Medications (Endocrine & Metabolic):  .  metFORMIN (GLUCOPHAGE) 500 MG tablet, Take 1 tablet  3 x /day with Meals for Diabetes  Current Outpatient Medications (Cardiovascular):  .  hydrochlorothiazide (HYDRODIURIL) 25 MG tablet, TAKE 1 TABLET BY MOUTH EVERY DAY  Current Outpatient Medications (Respiratory):  .  fexofenadine (ALLEGRA) 180 MG tablet, Take 1 tablet (180 mg total) by mouth daily. .  fluticasone-salmeterol (ADVAIR HFA) 115-21 MCG/ACT inhaler, Inhale 2 puffs into the lungs 2 (two) times daily.  Current Outpatient Medications  (Analgesics):  .  meloxicam (MOBIC) 15 MG tablet, TAKE 1/2 TO 1 TABLET DAILY WITH FOOD FOR PAIN & INFLAMMATION AND LIMIT TO 5 DAYS/WEEK TO AVOID KIDNEY DAMAGE  Current Outpatient Medications (Hematological):  .  cyanocobalamin 100 MCG tablet, Take 100 mcg by mouth daily.  Current Outpatient Medications (Other):  .  Apoaequorin (PREVAGEN PO), Take by mouth. .  blood glucose meter kit and supplies, Test sugars twice daily. Dx E11.9 .  calcium carbonate (CALCIUM 600) 600 MG TABS tablet, Take 600 mg by mouth daily. .  Cholecalciferol (VITAMIN D3) 5000 UNITS TABS, Take 5,000 Units by mouth daily. .  cyclobenzaprine (FLEXERIL) 10 MG tablet, Take 1 tablet (10 mg total) by mouth at bedtime as needed for muscle spasms. .  Glucosamine-Chondroitin (MOVE FREE PO), Take by mouth. .  Lancets (ONETOUCH DELICA PLUS [id number]) MISC, TEST SUGAR TWICE A DAY .  MAGNESIUM PO, Take 500 mg by mouth daily.  Marland Kitchen  omeprazole (PRILOSEC) 40 MG capsule, Take 1 capsule Daily for Indigestion & Heartburn .  ONETOUCH ULTRA test strip, TEST SUGAR TWICE A DAY .  OVER THE COUNTER MEDICATION,  .  PARoxetine (PAXIL) 10 MG tablet, Take 1 tablet  Daily at Bedtime for Mood .  Red Yeast Rice Extract (RED YEAST RICE PO), Take 2,000 mg by mouth daily.  Allergies: Allergies  Allergen Reactions  . Metronidazole Other (See Comments)    unknown  . Pneumovax 23 [Pneumococcal Vac Polyvalent]     Redness and severe swelling at injection site  . 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  Screening Tests Health Maintenance  Topic Date Due  . OPHTHALMOLOGY EXAM  03/03/2019  . FOOT EXAM  04/25/2020  . INFLUENZA VACCINE  06/30/2020  . HEMOGLOBIN A1C  10/31/2020  . URINE MICROALBUMIN  12/27/2020  . MAMMOGRAM  07/04/2021  . TETANUS/TDAP  04/23/2022  . COLONOSCOPY  09/09/2023  . DEXA SCAN  Completed  . COVID-19 Vaccine  Completed  . Hepatitis C Screening  Completed  . PNA vac Low Risk Adult  Completed    Immunization History  Administered Date(s) Administered  . Influenza, High Dose Seasonal PF 11/10/2017, 09/15/2018, 09/04/2019  . Influenza, Seasonal,  Injecte, Preservative Fre 10/17/2015  . Influenza,inj,quad, With Preservative 10/15/2016  . PFIZER SARS-COV-2 Vaccination 12/22/2019, 01/12/2020  . Pneumococcal Conjugate-13 12/22/2018  . Pneumococcal Polysaccharide-23 02/04/2017  . Pneumococcal-Unspecified 06/27/2013  . Tdap 04/23/2012  . Zoster 07/31/2015    Preventative care: Last colonoscopy: 2019 had 6 polyps, 5 years Dr. Earlean Shawl Pap: Hysterectomy Mammogram:  07/2019 goes to [facility] DEXA: 07/2019 osteopenia, at [facility] started fosamax 06/2017 but stopped 2020 due to jaw pain.  CXR 03/2016  Names of Other Physician/Practitioners you currently use: 1. [facility] here for primary care Patient Care Team: Unk Pinto, MD as PCP - General (Internal Medicine) Sypher, Herbie Baltimore, MD (Inactive) as Consulting Physician (Orthopedic Surgery) Monna Fam, MD as Consulting Physician (Ophthalmology) Richmond Campbell, MD as Consulting Physician (Gastroenterology) Cheri Fowler, MD as Consulting Physician (Obstetrics and Gynecology) Gaynelle Arabian, MD as Consulting Physician (Orthopedic Surgery) Marchia Bond, MD as Consulting Physician (Orthopedic Surgery) Mosetta Anis, MD as Referring Physician (Allergy) Larey Dresser, MD as Consulting Physician (Cardiology)  Surgical: She  has a past surgical history that includes Bilateral salpingoophorectomy (11/28/2010); Knee arthroscopy (Bilateral, 2005-2016); Colonoscopy; Carpal tunnel release (07/15/2012); Shoulder arthroscopy with rotator cuff repair and subacromial decompression (Right, 01/20/2013); Abdominal hysterectomy; Carpal tunnel release (Right, 05/31/2014); and Tubal ligation. Family Her family history includes Diabetes in her brother and mother; Heart disease in her brother; Hyperlipidemia in her brother; Hypertension in her father and mother; Stroke in her mother. Social history  She reports that she has never smoked. She has never used smokeless tobacco. She reports that she does not drink alcohol or use drugs.   Objective:   Today's Vitals   05/01/20 1506  BP: 126/80  Pulse: 62  Temp: 98.6 F (37 C)  SpO2: 96%  Weight: 193 lb (87.5 kg)  Height: _0  (1.626 m)  PainSc: 0-No pain   Body mass index is 33.13 kg/m.  General appearance: alert, no distress, WD/WN, female HEENT: normocephalic, sclerae anicteric, TMs pearly, nares patent, no discharge or erythema, pharynx normal Oral cavity: MMM, no lesions Neck: supple, no lymphadenopathy, no thyromegaly,some thyroid nodules largest on the left.  Heart: RRR, normal S1, S2, no murmurs Lungs: CTA bilaterally, no wheezes, rhonchi, or rales Abdomen: +bs, soft, non tender, non distended, no masses, no hepatomegaly, no splenomegaly Musculoskeletal: nontender, no swelling, no obvious deformity, Patient is able to ambulate well.  Extremities: right upper shoulder with 5x6 cm mobile lipoma. no edema, no cyanosis, no clubbing Pulses: 2+ symmetric, upper and lower extremities, normal cap refill Neurological: alert, oriented x 3, CN2-12 intact, strength normal upper extremities and lower extremities, sensation decreased slightly on bottom of bilateral  feet,, DTRs 2+ throughout, no cerebellar signs, gait normal Psychiatric: normal affect, behavior normal, pleasant   Diabetic Foot Exam - Simple   No data filed        Vicie Mutters, PA-C   05/02/2020

## 2020-05-01 ENCOUNTER — Encounter: Payer: Self-pay | Admitting: Physician Assistant

## 2020-05-01 ENCOUNTER — Ambulatory Visit (INDEPENDENT_AMBULATORY_CARE_PROVIDER_SITE_OTHER): Payer: Medicare HMO | Admitting: Physician Assistant

## 2020-05-01 ENCOUNTER — Other Ambulatory Visit: Payer: Self-pay

## 2020-05-01 VITALS — BP 126/80 | HR 62 | Temp 98.6°F | Ht 64.0 in | Wt 193.0 lb

## 2020-05-01 DIAGNOSIS — E559 Vitamin D deficiency, unspecified: Secondary | ICD-10-CM | POA: Diagnosis not present

## 2020-05-01 DIAGNOSIS — M545 Low back pain, unspecified: Secondary | ICD-10-CM

## 2020-05-01 DIAGNOSIS — M810 Age-related osteoporosis without current pathological fracture: Secondary | ICD-10-CM | POA: Diagnosis not present

## 2020-05-01 DIAGNOSIS — E782 Mixed hyperlipidemia: Secondary | ICD-10-CM | POA: Diagnosis not present

## 2020-05-01 DIAGNOSIS — J029 Acute pharyngitis, unspecified: Secondary | ICD-10-CM

## 2020-05-01 DIAGNOSIS — Z0001 Encounter for general adult medical examination with abnormal findings: Secondary | ICD-10-CM | POA: Diagnosis not present

## 2020-05-01 DIAGNOSIS — J45909 Unspecified asthma, uncomplicated: Secondary | ICD-10-CM | POA: Diagnosis not present

## 2020-05-01 DIAGNOSIS — R6889 Other general symptoms and signs: Secondary | ICD-10-CM

## 2020-05-01 DIAGNOSIS — E1169 Type 2 diabetes mellitus with other specified complication: Secondary | ICD-10-CM

## 2020-05-01 DIAGNOSIS — E1122 Type 2 diabetes mellitus with diabetic chronic kidney disease: Secondary | ICD-10-CM | POA: Diagnosis not present

## 2020-05-01 DIAGNOSIS — Z0000 Encounter for general adult medical examination without abnormal findings: Secondary | ICD-10-CM

## 2020-05-01 DIAGNOSIS — E66811 Obesity, class 1: Secondary | ICD-10-CM

## 2020-05-01 DIAGNOSIS — E041 Nontoxic single thyroid nodule: Secondary | ICD-10-CM

## 2020-05-01 DIAGNOSIS — E785 Hyperlipidemia, unspecified: Secondary | ICD-10-CM

## 2020-05-01 DIAGNOSIS — E669 Obesity, unspecified: Secondary | ICD-10-CM | POA: Diagnosis not present

## 2020-05-01 DIAGNOSIS — I10 Essential (primary) hypertension: Secondary | ICD-10-CM | POA: Diagnosis not present

## 2020-05-01 DIAGNOSIS — N182 Chronic kidney disease, stage 2 (mild): Secondary | ICD-10-CM | POA: Diagnosis not present

## 2020-05-01 DIAGNOSIS — D1721 Benign lipomatous neoplasm of skin and subcutaneous tissue of right arm: Secondary | ICD-10-CM

## 2020-05-01 DIAGNOSIS — Z79899 Other long term (current) drug therapy: Secondary | ICD-10-CM | POA: Diagnosis not present

## 2020-05-01 DIAGNOSIS — K219 Gastro-esophageal reflux disease without esophagitis: Secondary | ICD-10-CM

## 2020-05-01 NOTE — Patient Instructions (Addendum)
Your LDL is not in range or at goal, goal is less than 70.  Your LDL is the bad cholesterol that can lead to heart attack and stroke. To lower your number you can decrease your fatty foods, red meat, cheese, milk and increase fiber like whole grains and veggies. You can also add a fiber supplement like Citracel or Benefiber, these do not cause gas and bloating and are safe to use. Since you have risk factors that make [name] want your number below 70, we need to adjust or add medications to get your number below goal.   YOU CAN CALL TO MAKE AN ULTRASOUND..  I have put in an order for an ultrasound for you to have You can set them up at your convenience by calling this number [phone] You will likely have the ultrasound at [address] [place]  If you have any issues call our office and we will set this up for you.    If the thyroid US is negative and you continue to have the pain, we will refer you to an ENT for an evaluation.  Make sure you are treating your allergies.    Thyroid Nodule  A thyroid nodule is an isolated growth of thyroid cells that forms a lump in your thyroid gland. The thyroid gland is a butterfly-shaped gland. It is found in the lower front of your neck. This gland sends chemical messengers (hormones) through your blood to all parts of your body. These hormones are important in regulating your body temperature and helping your body to use energy. Thyroid nodules are common. Most are not cancerous (benign). You may have one nodule or several nodules. Different types of thyroid nodules include nodules that:  Grow and fill with fluid (thyroid cysts).  Produce too much thyroid hormone (hot nodules or hyperthyroid).  Produce no thyroid hormone (cold nodules or hypothyroid).  Form from cancer cells (thyroid cancers). What are the causes? In most cases, the cause of this condition is not known. What increases the risk? The following factors may make you more likely  to develop this condition.  Age. Thyroid nodules become more common in people who are older than 69 years of age.  Gender. ? Benign thyroid nodules are more common in women. ? Cancerous (malignant) thyroid nodules are more common in men.  A family history that includes: ? Thyroid nodules. ? Pheochromocytoma. ? Thyroid carcinoma. ? Hyperparathyroidism.  Certain kinds of thyroid diseases, such as Hashimoto's thyroiditis.  Lack of iodine in your diet.  A history of head and neck radiation, such as from previous cancer treatment. What are the signs or symptoms? In many cases, there are no symptoms. If you have symptoms, they may include:  A lump in your lower neck.  Feeling a lump or tickle in your throat.  Pain in your neck, jaw, or ear.  Having trouble swallowing. Hot nodules may cause symptoms that include:  Weight loss.  Warm, flushed skin.  Feeling hot.  Feeling nervous.  A racing heartbeat. Cold nodules may cause symptoms that include:  Weight gain.  Dry skin.  Brittle hair. This may also occur with hair loss.  Feeling cold.  Fatigue. Thyroid cancer nodules may cause symptoms that include:  Hard nodules that feel stuck to the thyroid gland.  Hoarseness.  Lumps in the glands near your thyroid (lymph nodes). How is this diagnosed? A thyroid nodule may be felt by your health care provider during a physical exam. This  condition may also be diagnosed based on your symptoms. You may also have tests, including:  An ultrasound. This may be done to confirm the diagnosis.  A biopsy. This involves taking a sample from the nodule and looking at it under a microscope.  Blood tests to make sure that your thyroid is working properly.  A thyroid scan. This test uses a radioactive tracer injected into a vein to create an image of the thyroid gland on a computer screen.  Imaging tests such as MRI or CT scan. These may be done if: ? Your nodule is large. ? Your  nodule is blocking your airway. ? Cancer is suspected. How is this treated? Treatment depends on the cause and size of your nodule or nodules. If the nodule is benign, treatment may not be necessary. Your health care provider may monitor the nodule to see if it goes away without treatment. If the nodule continues to grow, is cancerous, or does not go away, treatment may be needed. Treatment may include:  Having a cystic nodule drained with a needle.  Ablation therapy. In this treatment, alcohol is injected into the area of the nodule to destroy the cells. Ablation with heat (thermal ablation) may also be used.  Radioactive iodine. In this treatment, radioactive iodine is given as a pill or liquid that you drink. This substance causes the thyroid nodule to shrink.  Surgery to remove the nodule. Part or all of your thyroid gland may need to be removed as well.  Medicines. Follow these instructions at home:  Pay attention to any changes in your nodule.  Take over-the-counter and prescription medicines only as told by your health care provider.  Keep all follow-up visits as told by your health care provider. This is important. Contact a health care provider if:  Your voice changes.  You have trouble swallowing.  You have pain in your neck, ear, or jaw that is getting worse.  Your nodule gets bigger.  Your nodule starts to make it harder for you to breathe.  Your muscles look like they are shrinking (muscle wasting). Get help right away if:  You have chest pain.  There is a loss of consciousness.  You have a sudden fever.  You feel confused.  You are seeing or hearing things that other people do not see or hear (having hallucinations).  You feel very weak.  You have mood swings.  You feel very restless.  You feel suddenly nauseous or throw up.  You suddenly have diarrhea. Summary  A thyroid nodule is an isolated growth of thyroid cells that forms a lump in your  thyroid gland.  Thyroid nodules are common. Most are not cancerous (benign). You may have one nodule or several nodules.  Treatment depends on the cause and size of your nodule or nodules. If the nodule is benign, treatment may not be necessary.  Your health care provider may monitor the nodule to see if it goes away without treatment. If the nodule continues to grow, is cancerous, or does not go away, treatment may be needed. This information is not intended to replace advice given to you by your health care provider. Make sure you discuss any questions you have with your health care provider. Document Revised: 07/01/2018 Document Reviewed: 07/04/2018 Elsevier Patient Education  [place].

## 2020-05-02 LAB — TSH: TSH: 1.64 mIU/L (ref 0.40–4.50)

## 2020-05-02 LAB — CBC WITH DIFFERENTIAL/PLATELET
Absolute Monocytes: 286 cells/uL (ref 200–950)
Basophils Absolute: 20 cells/uL (ref 0–200)
Basophils Relative: 0.3 %
Eosinophils Absolute: 72 cells/uL (ref 15–500)
Eosinophils Relative: 1.1 %
HCT: 38.4 % (ref 35.0–45.0)
Hemoglobin: 11.7 g/dL (ref 11.7–15.5)
Lymphs Abs: 3062 cells/uL (ref 850–3900)
MCH: 26.8 pg — ABNORMAL LOW (ref 27.0–33.0)
MCHC: 30.5 g/dL — ABNORMAL LOW (ref 32.0–36.0)
MCV: 87.9 fL (ref 80.0–100.0)
MPV: 12 fL (ref 7.5–12.5)
Monocytes Relative: 4.4 %
Neutro Abs: 3062 cells/uL (ref 1500–7800)
Neutrophils Relative %: 47.1 %
Platelets: 199 10*3/uL (ref 140–400)
RBC: 4.37 10*6/uL (ref 3.80–5.10)
RDW: 13.9 % (ref 11.0–15.0)
Total Lymphocyte: 47.1 %
WBC: 6.5 10*3/uL (ref 3.8–10.8)

## 2020-05-02 LAB — LIPID PANEL
Cholesterol: 219 mg/dL — ABNORMAL HIGH (ref <200)
HDL: 86 mg/dL (ref >=50)
LDL Cholesterol (Calc): 113 mg/dL (calc) — ABNORMAL HIGH
Non-HDL Cholesterol (Calc): 133 mg/dL (calc) — ABNORMAL HIGH (ref <130)
Total CHOL/HDL Ratio: 2.5 (calc) (ref <5.0)
Triglycerides: 92 mg/dL (ref <150)

## 2020-05-02 LAB — HEMOGLOBIN A1C
Hgb A1c MFr Bld: 6 % of total Hgb — ABNORMAL HIGH (ref <5.7)
Mean Plasma Glucose: 126 (calc)
eAG (mmol/L): 7 (calc)

## 2020-05-02 LAB — COMPLETE METABOLIC PANEL WITH GFR
AG Ratio: 1.8 (calc) (ref 1.0–2.5)
ALT: 14 U/L (ref 6–29)
AST: 19 U/L (ref 10–35)
Albumin: 4.3 g/dL (ref 3.6–5.1)
Alkaline phosphatase (APISO): 106 U/L (ref 37–153)
BUN: 16 mg/dL (ref 7–25)
CO2: 30 mmol/L (ref 20–32)
Calcium: 9.9 mg/dL (ref 8.6–10.4)
Chloride: 105 mmol/L (ref 98–110)
Creat: 0.95 mg/dL (ref 0.50–0.99)
GFR, Est African American: 71 mL/min/{1.73_m2} (ref >=60)
GFR, Est Non African American: 62 mL/min/{1.73_m2} (ref >=60)
Globulin: 2.4 g/dL (calc) (ref 1.9–3.7)
Glucose, Bld: 99 mg/dL (ref 65–99)
Potassium: 4.2 mmol/L (ref 3.5–5.3)
Sodium: 142 mmol/L (ref 135–146)
Total Bilirubin: 0.4 mg/dL (ref 0.2–1.2)
Total Protein: 6.7 g/dL (ref 6.1–8.1)

## 2020-05-02 LAB — MAGNESIUM: Magnesium: 1.8 mg/dL (ref 1.5–2.5)

## 2020-05-07 DIAGNOSIS — J3089 Other allergic rhinitis: Secondary | ICD-10-CM | POA: Diagnosis not present

## 2020-05-07 DIAGNOSIS — J301 Allergic rhinitis due to pollen: Secondary | ICD-10-CM | POA: Diagnosis not present

## 2020-05-14 ENCOUNTER — Other Ambulatory Visit: Payer: Self-pay | Admitting: Adult Health Nurse Practitioner

## 2020-05-16 ENCOUNTER — Ambulatory Visit
Admission: RE | Admit: 2020-05-16 | Discharge: 2020-05-16 | Disposition: A | Discharge status: Home / Self Care | Payer: Medicare HMO | Source: Ambulatory Visit | Attending: Physician Assistant | Admitting: Physician Assistant

## 2020-05-16 DIAGNOSIS — E041 Nontoxic single thyroid nodule: Secondary | ICD-10-CM

## 2020-05-16 DIAGNOSIS — J301 Allergic rhinitis due to pollen: Secondary | ICD-10-CM | POA: Diagnosis not present

## 2020-05-16 DIAGNOSIS — J3089 Other allergic rhinitis: Secondary | ICD-10-CM | POA: Diagnosis not present

## 2020-05-16 DIAGNOSIS — J029 Acute pharyngitis, unspecified: Secondary | ICD-10-CM

## 2020-05-22 DIAGNOSIS — J3089 Other allergic rhinitis: Secondary | ICD-10-CM | POA: Diagnosis not present

## 2020-05-22 DIAGNOSIS — J301 Allergic rhinitis due to pollen: Secondary | ICD-10-CM | POA: Diagnosis not present

## 2020-05-28 DIAGNOSIS — J3089 Other allergic rhinitis: Secondary | ICD-10-CM | POA: Diagnosis not present

## 2020-05-28 DIAGNOSIS — J301 Allergic rhinitis due to pollen: Secondary | ICD-10-CM | POA: Diagnosis not present

## 2020-06-04 DIAGNOSIS — J3089 Other allergic rhinitis: Secondary | ICD-10-CM | POA: Diagnosis not present

## 2020-06-04 DIAGNOSIS — J301 Allergic rhinitis due to pollen: Secondary | ICD-10-CM | POA: Diagnosis not present

## 2020-06-12 DIAGNOSIS — J3089 Other allergic rhinitis: Secondary | ICD-10-CM | POA: Diagnosis not present

## 2020-06-12 DIAGNOSIS — J301 Allergic rhinitis due to pollen: Secondary | ICD-10-CM | POA: Diagnosis not present

## 2020-06-14 ENCOUNTER — Other Ambulatory Visit: Payer: Self-pay | Admitting: Internal Medicine

## 2020-06-17 DIAGNOSIS — J3089 Other allergic rhinitis: Secondary | ICD-10-CM | POA: Diagnosis not present

## 2020-06-18 DIAGNOSIS — M25561 Pain in right knee: Secondary | ICD-10-CM | POA: Diagnosis not present

## 2020-06-18 DIAGNOSIS — M170 Bilateral primary osteoarthritis of knee: Secondary | ICD-10-CM | POA: Diagnosis not present

## 2020-06-18 DIAGNOSIS — M1711 Unilateral primary osteoarthritis, right knee: Secondary | ICD-10-CM | POA: Diagnosis not present

## 2020-06-18 DIAGNOSIS — M1712 Unilateral primary osteoarthritis, left knee: Secondary | ICD-10-CM | POA: Diagnosis not present

## 2020-06-25 DIAGNOSIS — J301 Allergic rhinitis due to pollen: Secondary | ICD-10-CM | POA: Diagnosis not present

## 2020-06-25 DIAGNOSIS — J3089 Other allergic rhinitis: Secondary | ICD-10-CM | POA: Diagnosis not present

## 2020-07-02 DIAGNOSIS — J3089 Other allergic rhinitis: Secondary | ICD-10-CM | POA: Diagnosis not present

## 2020-07-02 DIAGNOSIS — J301 Allergic rhinitis due to pollen: Secondary | ICD-10-CM | POA: Diagnosis not present

## 2020-07-04 DIAGNOSIS — H401131 Primary open-angle glaucoma, bilateral, mild stage: Secondary | ICD-10-CM | POA: Diagnosis not present

## 2020-07-04 DIAGNOSIS — E119 Type 2 diabetes mellitus without complications: Secondary | ICD-10-CM | POA: Diagnosis not present

## 2020-07-04 LAB — HM DIABETES EYE EXAM

## 2020-07-10 DIAGNOSIS — J3089 Other allergic rhinitis: Secondary | ICD-10-CM | POA: Diagnosis not present

## 2020-07-10 DIAGNOSIS — J301 Allergic rhinitis due to pollen: Secondary | ICD-10-CM | POA: Diagnosis not present

## 2020-07-12 DIAGNOSIS — Z1231 Encounter for screening mammogram for malignant neoplasm of breast: Secondary | ICD-10-CM | POA: Diagnosis not present

## 2020-07-12 LAB — HM MAMMOGRAPHY

## 2020-07-17 DIAGNOSIS — J3089 Other allergic rhinitis: Secondary | ICD-10-CM | POA: Diagnosis not present

## 2020-07-17 DIAGNOSIS — J301 Allergic rhinitis due to pollen: Secondary | ICD-10-CM | POA: Diagnosis not present

## 2020-07-18 ENCOUNTER — Other Ambulatory Visit: Payer: Self-pay | Admitting: Internal Medicine

## 2020-07-23 DIAGNOSIS — J3089 Other allergic rhinitis: Secondary | ICD-10-CM | POA: Diagnosis not present

## 2020-07-23 DIAGNOSIS — J301 Allergic rhinitis due to pollen: Secondary | ICD-10-CM | POA: Diagnosis not present

## 2020-07-30 DIAGNOSIS — J3089 Other allergic rhinitis: Secondary | ICD-10-CM | POA: Diagnosis not present

## 2020-07-30 DIAGNOSIS — J301 Allergic rhinitis due to pollen: Secondary | ICD-10-CM | POA: Diagnosis not present

## 2020-07-30 NOTE — Progress Notes (Signed)
FOLLOW UP  Assessment and Plan:   Hypertension Well controlled with current medications  Monitor blood pressure at home; patient to call if consistently greater than 130/80 Continue DASH diet.   Reminder to go to the ER if any CP, SOB, nausea, dizziness, severe HA, changes vision/speech, left arm numbness and tingling and jaw pain.  Cholesterol Currently near goal; continue RYRS, discussed initiate statin if progresses Continue low cholesterol diet and exercise.  Check lipid panel.   Prediabetes Borderline diabetic; would like to postpone metformin and work on lifestyle Continue diet and exercise.  Perform daily foot/skin check, notify office of any concerning changes.  Check A1C  Obesity with co morbidities Long discussion about weight loss, diet, and exercise Recommended diet heavy in fruits and veggies and low in animal meats, cheeses, and dairy products, appropriate calorie intake Discussed ideal weight for height and initial weight goal (190 lb) Will follow up in 3 months  Vitamin D Def Below goal at last check which was removed; continue supplementation to maintain goal of 70-100 Check Vit D level  Lipoma of right upper extremity No numbness tingling in arm but patient is having some restriction with movement due to size and she is right handed, will refer to gen surgery due to location and size   Hot flashes Try effexor  Continue diet and meds as discussed. Further disposition pending results of labs. Discussed med's effects and SE's.   Over 30 minutes of exam, counseling, chart review, and critical decision making was performed.   Future Appointments  Date Time Provider [place]  12/27/2020  9:30 AM Lauren Mutters, PA-C [id number] None    ----------------------------------------------------------------------------------------------------------------------  HPI 69 y.o. female  presents for 3 month follow up on hypertension, cholesterol, diabetes,  obesity and vitamin D deficiency.   R hip pain ongoing, followed by Dr. Sharol Given, discussing MRI.   She stopped the paxil for hot flashes due to bad dreams. Could not tolerate lexapro.   BMI is Body mass index is 33.13 kg/m., she has been working on diet.  Wt Readings from Last 3 Encounters:  08/01/20 193 lb (87.5 kg)  05/01/20 193 lb (87.5 kg)  12/28/19 198 lb 12.8 oz (90.2 kg)   Her blood pressure has been controlled at home, today their BP is BP: 130/72  She does not workout. She denies chest pain, shortness of breath, dizziness.   She is not on cholesterol medication (takes RYRS) and denies myalgias. Her cholesterol is not at goal. The cholesterol last visit was:   Lab Results  Component Value Date   CHOL 219 (H) 05/01/2020   HDL 86 05/01/2020   LDLCALC 113 (H) 05/01/2020   TRIG 92 05/01/2020   CHOLHDL 2.5 05/01/2020    She has been working on diet for borderline T2 diabetes/ prediabetes, and denies foot ulcerations, increased appetite, nausea, paresthesia of the feet, polydipsia, polyuria, visual disturbances, vomiting and weight loss. Last A1C in the office was:  Lab Results  Component Value Date   HGBA1C 6.0 (H) 05/01/2020   Patient is on Vitamin D supplement.   Lab Results  Component Value Date   VD25OH 56 12/28/2019        Current Medications:  Current Outpatient Medications on File Prior to Visit  Medication Sig  . Apoaequorin (PREVAGEN PO) Take by mouth.  . blood glucose meter kit and supplies Test sugars twice daily. Dx E11.9  . calcium carbonate (CALCIUM 600) 600 MG TABS tablet Take 600 mg by mouth daily.  Marland Kitchen  Cholecalciferol (VITAMIN D3) 5000 UNITS TABS Take 5,000 Units by mouth daily.  . cyanocobalamin 100 MCG tablet Take 100 mcg by mouth daily.  . cyclobenzaprine (FLEXERIL) 10 MG tablet Take 1 tablet (10 mg total) by mouth at bedtime as needed for muscle spasms.  . fexofenadine (ALLEGRA) 180 MG tablet Take 1 tablet (180 mg total) by mouth daily.  .  fluticasone-salmeterol (ADVAIR HFA) 115-21 MCG/ACT inhaler Inhale 2 puffs into the lungs 2 (two) times daily.  . Glucosamine-Chondroitin (MOVE FREE PO) Take by mouth.  . hydrochlorothiazide (HYDRODIURIL) 25 MG tablet TAKE 1 TABLET BY MOUTH EVERY DAY  . Lancets (ONETOUCH DELICA PLUS [id number]) MISC TEST SUGAR TWICE A DAY  . MAGNESIUM PO Take 500 mg by mouth daily.   . meloxicam (MOBIC) 15 MG tablet TAKE 1/2 TO 1 TABLET DAILY WITH FOOD FOR PAIN & INFLAMMATION AND LIMIT TO 5 DAYS/WEEK TO AVOID KIDNEY DAMAGE  . metFORMIN (GLUCOPHAGE) 500 MG tablet Take 1 tablet  3 x /day with Meals for Diabetes  . omeprazole (PRILOSEC) 40 MG capsule TAKE 1 CAPSULE DAILY FOR INDIGESTION & HEARTBURN  . ONETOUCH ULTRA test strip TEST SUGAR TWICE A DAY  . OVER THE COUNTER MEDICATION   . PARoxetine (PAXIL) 10 MG tablet TAKE 1 TABLET DAILY AT BEDTIME FOR MOOD  . Red Yeast Rice Extract (RED YEAST RICE PO) Take 2,000 mg by mouth daily.   No current facility-administered medications on file prior to visit.     Allergies:  Allergies  Allergen Reactions  . Metronidazole Other (See Comments)    unknown  . Pneumovax 23 [Pneumococcal Vac Polyvalent]     Redness and severe swelling at injection site  . Ppd [Tuberculin Purified Protein Derivative] Other (See Comments)    + PPD 2013 with NEG CXR 05/2012     Medical History:  Past Medical History:  Diagnosis Date  . Anemia   . Arthritis    "right hand, back" (04/03/2016)  . Asthma   . Bilateral carpal tunnel syndrome   . Chronic lower back pain   . GERD (gastroesophageal reflux disease)   . Hyperlipidemia   . Hypertension   . Multiple allergies   . Pneumonia 03/2012  . Positive PPD    "they told me it wasn't positive; I was allergic to the test itself"  . Type II diabetes mellitus ([place])   . Vitamin deficiency   . Wears dentures    upper  . Wears glasses    Family history- Reviewed and unchanged Social history- Reviewed and unchanged   Review of Systems:   Review of Systems  Constitutional: Negative for malaise/fatigue and weight loss.  HENT: Negative for hearing loss and tinnitus.   Eyes: Negative for blurred vision and double vision.  Respiratory: Negative for cough, shortness of breath and wheezing.   Cardiovascular: Negative for chest pain, palpitations, orthopnea, claudication and leg swelling.  Gastrointestinal: Negative for abdominal pain, blood in stool, constipation, diarrhea, heartburn, melena, nausea and vomiting.  Genitourinary: Negative.   Musculoskeletal: Positive for joint pain (R hip). Negative for myalgias.  Skin: Negative for rash.  Neurological: Negative for dizziness, tingling, sensory change, weakness and headaches.  Endo/Heme/Allergies: Negative for polydipsia.  Psychiatric/Behavioral: Negative.   All other systems reviewed and are negative.   Physical Exam: BP 130/72   Pulse 83   Temp (!) 97.3 F (36.3 C)   Wt 193 lb (87.5 kg)   SpO2 98%   BMI 33.13 kg/m  Wt Readings from Last 3 Encounters:  08/01/20  193 lb (87.5 kg)  05/01/20 193 lb (87.5 kg)  12/28/19 198 lb 12.8 oz (90.2 kg)   General Appearance: Well nourished, in no apparent distress. Eyes: PERRLA, EOMs, conjunctiva no swelling or erythema Sinuses: No Frontal/maxillary tenderness ENT/Mouth: Ext aud canals clear, TMs without erythema, bulging. No erythema, swelling, or exudate on post pharynx.  Tonsils not swollen or erythematous. Hearing normal.  Neck: Supple, thyroid normal.  Respiratory: Respiratory effort normal, BS equal bilaterally without rales, rhonchi, wheezing or stridor.  Cardio: RRR with no MRGs. Brisk peripheral pulses without edema.  Abdomen: Soft, + BS.  Non tender, no guarding, rebound, hernias, masses. Lymphatics: Non tender without lymphadenopathy.  Musculoskeletal: Full ROM, 5/5 strength, antalgic gait, right medial knee with mobile fatty tissue. Skin: right upper shoulder with 5x6 cm mobile lipoma. Warm, dry without rashes,  lesions, ecchymosis.  Neuro: Cranial nerves intact. No cerebellar symptoms.  Psych: Awake and oriented X 3, normal affect, Insight and Judgment appropriate.    Lauren Mutters, PA-C 11:20 AM [facility]

## 2020-08-01 ENCOUNTER — Ambulatory Visit (INDEPENDENT_AMBULATORY_CARE_PROVIDER_SITE_OTHER): Payer: Medicare HMO | Admitting: Physician Assistant

## 2020-08-01 ENCOUNTER — Encounter: Payer: Self-pay | Admitting: Physician Assistant

## 2020-08-01 ENCOUNTER — Other Ambulatory Visit: Payer: Self-pay

## 2020-08-01 VITALS — BP 130/72 | HR 83 | Temp 97.3°F | Wt 193.0 lb

## 2020-08-01 DIAGNOSIS — E1122 Type 2 diabetes mellitus with diabetic chronic kidney disease: Secondary | ICD-10-CM | POA: Diagnosis not present

## 2020-08-01 DIAGNOSIS — N182 Chronic kidney disease, stage 2 (mild): Secondary | ICD-10-CM

## 2020-08-01 DIAGNOSIS — D1721 Benign lipomatous neoplasm of skin and subcutaneous tissue of right arm: Secondary | ICD-10-CM

## 2020-08-01 DIAGNOSIS — E782 Mixed hyperlipidemia: Secondary | ICD-10-CM

## 2020-08-01 DIAGNOSIS — E559 Vitamin D deficiency, unspecified: Secondary | ICD-10-CM

## 2020-08-01 DIAGNOSIS — E1169 Type 2 diabetes mellitus with other specified complication: Secondary | ICD-10-CM | POA: Diagnosis not present

## 2020-08-01 DIAGNOSIS — R232 Flushing: Secondary | ICD-10-CM

## 2020-08-01 DIAGNOSIS — E785 Hyperlipidemia, unspecified: Secondary | ICD-10-CM

## 2020-08-01 DIAGNOSIS — I10 Essential (primary) hypertension: Secondary | ICD-10-CM | POA: Diagnosis not present

## 2020-08-01 DIAGNOSIS — Z79899 Other long term (current) drug therapy: Secondary | ICD-10-CM | POA: Diagnosis not present

## 2020-08-01 MED ORDER — VENLAFAXINE HCL ER 37.5 MG PO CP24: 37.5000 mg | ORAL_CAPSULE | Freq: Every day | ORAL | 1 refills | Status: DC

## 2020-08-01 NOTE — Patient Instructions (Signed)
Do effexor 37.5 mg a day in the AM for hot flashes Should see a difference 2 weeks You can increase to 75 mg if it is helping Let me know how you do   Menopause Menopause is the normal time of life when menstrual periods stop completely. It is usually confirmed by 12 months without a menstrual period. The transition to menopause (perimenopause) most often happens between the ages of 58 and 37. During perimenopause, hormone levels change in your body, which can cause symptoms and affect your health. Menopause may increase your risk for:  Loss of bone (osteoporosis), which causes bone breaks (fractures).  Depression.  Hardening and narrowing of the arteries (atherosclerosis), which can cause heart attacks and strokes. What are the causes? This condition is usually caused by a natural change in hormone levels that happens as you get older. The condition may also be caused by surgery to remove both ovaries (bilateral oophorectomy). What increases the risk? This condition is more likely to start at an earlier age if you have certain medical conditions or treatments, including:  A tumor of the pituitary gland in the brain.  A disease that affects the ovaries and hormone production.  Radiation treatment for cancer.  Certain cancer treatments, such as chemotherapy or hormone (anti-estrogen) therapy.  Heavy smoking and excessive alcohol use.  Family history of early menopause. This condition is also more likely to develop earlier in women who are very thin. What are the signs or symptoms? Symptoms of this condition include:  Hot flashes.  Irregular menstrual periods.  Night sweats.  Changes in feelings about sex. This could be a decrease in sex drive or an increased comfort around your sexuality.  Vaginal dryness and thinning of the vaginal walls. This may cause painful intercourse.  Dryness of the skin and development of wrinkles.  Headaches.  Problems sleeping  (insomnia).  Mood swings or irritability.  Memory problems.  Weight gain.  Hair growth on the face and chest.  Bladder infections or problems with urinating. How is this diagnosed? This condition is diagnosed based on your medical history, a physical exam, your age, your menstrual history, and your symptoms. Hormone tests may also be done. How is this treated? In some cases, no treatment is needed. You and your health care provider should make a decision together about whether treatment is necessary. Treatment will be based on your individual condition and preferences. Treatment for this condition focuses on managing symptoms. Treatment may include:  Menopausal hormone therapy (MHT).  Medicines to treat specific symptoms or complications.  Acupuncture.  Vitamin or herbal supplements. Before starting treatment, make sure to let your health care provider know if you have a personal or family history of:  Heart disease.  Breast cancer.  Blood clots.  Diabetes.  Osteoporosis. Follow these instructions at home: Lifestyle  Do not use any products that contain nicotine or tobacco, such as cigarettes and e-cigarettes. If you need help quitting, ask your health care provider.  Get at least 30 minutes of physical activity on 5 or more days each week.  Avoid alcoholic and caffeinated beverages, as well as spicy foods. This may help prevent hot flashes.  Get 7-8 hours of sleep each night.  If you have hot flashes, try: ? Dressing in layers. ? Avoiding things that may trigger hot flashes, such as spicy food, warm places, or stress. ? Taking slow, deep breaths when a hot flash starts. ? Keeping a fan in your home and office.  Find ways  to manage stress, such as deep breathing, meditation, or journaling.  Consider going to group therapy with other women who are having menopause symptoms. Ask your health care provider about recommended group therapy meetings. Eating and  drinking  Eat a healthy, balanced diet that contains whole grains, lean protein, low-fat dairy, and plenty of fruits and vegetables.  Your health care provider may recommend adding more soy to your diet. Foods that contain soy include tofu, tempeh, and soy milk.  Eat plenty of foods that contain calcium and vitamin D for bone health. Items that are rich in calcium include low-fat milk, yogurt, beans, almonds, sardines, broccoli, and kale. Medicines  Take over-the-counter and prescription medicines only as told by your health care provider.  Talk with your health care provider before starting any herbal supplements. If prescribed, take vitamins and supplements as told by your health care provider. These may include: ? Calcium. Women age 31 and older should get 1,200 mg (milligrams) of calcium every day. ? Vitamin D. Women need 600-800 International Units of vitamin D each day. ? Vitamins B12 and B6. Aim for 50 micrograms of B12 and 1.5 mg of B6 each day. General instructions  Keep track of your menstrual periods, including: ? When they occur. ? How heavy they are and how long they last. ? How much time passes between periods.  Keep track of your symptoms, noting when they start, how often you have them, and how long they last.  Use vaginal lubricants or moisturizers to help with vaginal dryness and improve comfort during sex.  Keep all follow-up visits as told by your health care provider. This is important. This includes any group therapy or counseling. Contact a health care provider if:  You are still having menstrual periods after age 11.  You have pain during sex.  You have not had a period for 12 months and you develop vaginal bleeding. Get help right away if:  You have: ? Severe depression. ? Excessive vaginal bleeding. ? Pain when you urinate. ? A fast or irregular heart beat (palpitations). ? Severe headaches. ? Abdomen (abdominal) pain or severe indigestion.  You  fell and you think you have a broken bone.  You develop leg or chest pain.  You develop vision problems.  You feel a lump in your breast. Summary  Menopause is the normal time of life when menstrual periods stop completely. It is usually confirmed by 12 months without a menstrual period.  The transition to menopause (perimenopause) most often happens between the ages of 74 and 18.  Symptoms can be managed through medicines, lifestyle changes, and complementary therapies such as acupuncture.  Eat a balanced diet that is rich in nutrients to promote bone health and heart health and to manage symptoms during menopause. This information is not intended to replace advice given to you by your health care provider. Make sure you discuss any questions you have with your health care provider. Document Revised: 10/29/2017 Document Reviewed: 12/19/2016 Elsevier Patient Education  2020 [employer].

## 2020-08-02 LAB — CBC WITH DIFFERENTIAL/PLATELET
Absolute Monocytes: 316 cells/uL (ref 200–950)
Basophils Absolute: 12 cells/uL (ref 0–200)
Basophils Relative: 0.2 %
Eosinophils Absolute: 68 cells/uL (ref 15–500)
Eosinophils Relative: 1.1 %
HCT: 39.1 % (ref 35.0–45.0)
Hemoglobin: 12.3 g/dL (ref 11.7–15.5)
Lymphs Abs: 3137 cells/uL (ref 850–3900)
MCH: 27.6 pg (ref 27.0–33.0)
MCHC: 31.5 g/dL — ABNORMAL LOW (ref 32.0–36.0)
MCV: 87.9 fL (ref 80.0–100.0)
MPV: 11.6 fL (ref 7.5–12.5)
Monocytes Relative: 5.1 %
Neutro Abs: 2666 cells/uL (ref 1500–7800)
Neutrophils Relative %: 43 %
Platelets: 278 10*3/uL (ref 140–400)
RBC: 4.45 10*6/uL (ref 3.80–5.10)
RDW: 13.4 % (ref 11.0–15.0)
Total Lymphocyte: 50.6 %
WBC: 6.2 10*3/uL (ref 3.8–10.8)

## 2020-08-02 LAB — COMPLETE METABOLIC PANEL WITH GFR
AG Ratio: 1.5 (calc) (ref 1.0–2.5)
ALT: 17 U/L (ref 6–29)
AST: 19 U/L (ref 10–35)
Albumin: 4.3 g/dL (ref 3.6–5.1)
Alkaline phosphatase (APISO): 107 U/L (ref 37–153)
BUN: 18 mg/dL (ref 7–25)
CO2: 27 mmol/L (ref 20–32)
Calcium: 10.2 mg/dL (ref 8.6–10.4)
Chloride: 106 mmol/L (ref 98–110)
Creat: 0.82 mg/dL (ref 0.50–0.99)
GFR, Est African American: 85 mL/min/{1.73_m2} (ref >=60)
GFR, Est Non African American: 74 mL/min/{1.73_m2} (ref >=60)
Globulin: 2.9 g/dL (calc) (ref 1.9–3.7)
Glucose, Bld: 113 mg/dL — ABNORMAL HIGH (ref 65–99)
Potassium: 4.4 mmol/L (ref 3.5–5.3)
Sodium: 140 mmol/L (ref 135–146)
Total Bilirubin: 0.4 mg/dL (ref 0.2–1.2)
Total Protein: 7.2 g/dL (ref 6.1–8.1)

## 2020-08-02 LAB — HEMOGLOBIN A1C
Hgb A1c MFr Bld: 6.4 % of total Hgb — ABNORMAL HIGH (ref <5.7)
Mean Plasma Glucose: 137 (calc)
eAG (mmol/L): 7.6 (calc)

## 2020-08-02 LAB — MAGNESIUM: Magnesium: 2 mg/dL (ref 1.5–2.5)

## 2020-08-02 LAB — LIPID PANEL
Cholesterol: 234 mg/dL — ABNORMAL HIGH (ref <200)
HDL: 89 mg/dL (ref >=50)
LDL Cholesterol (Calc): 128 mg/dL (calc) — ABNORMAL HIGH
Non-HDL Cholesterol (Calc): 145 mg/dL (calc) — ABNORMAL HIGH (ref <130)
Total CHOL/HDL Ratio: 2.6 (calc) (ref <5.0)
Triglycerides: 74 mg/dL (ref <150)

## 2020-08-02 LAB — TSH: TSH: 2.32 mIU/L (ref 0.40–4.50)

## 2020-08-02 LAB — VITAMIN D 25 HYDROXY (VIT D DEFICIENCY, FRACTURES): Vit D, 25-Hydroxy: 54 ng/mL (ref 30–100)

## 2020-08-06 DIAGNOSIS — J3089 Other allergic rhinitis: Secondary | ICD-10-CM | POA: Diagnosis not present

## 2020-08-06 DIAGNOSIS — J301 Allergic rhinitis due to pollen: Secondary | ICD-10-CM | POA: Diagnosis not present

## 2020-08-13 DIAGNOSIS — J3089 Other allergic rhinitis: Secondary | ICD-10-CM | POA: Diagnosis not present

## 2020-08-13 DIAGNOSIS — J301 Allergic rhinitis due to pollen: Secondary | ICD-10-CM | POA: Diagnosis not present

## 2020-08-14 ENCOUNTER — Encounter: Payer: Self-pay | Admitting: Internal Medicine

## 2020-08-20 DIAGNOSIS — J3089 Other allergic rhinitis: Secondary | ICD-10-CM | POA: Diagnosis not present

## 2020-08-20 DIAGNOSIS — J301 Allergic rhinitis due to pollen: Secondary | ICD-10-CM | POA: Diagnosis not present

## 2020-08-23 ENCOUNTER — Other Ambulatory Visit: Payer: Self-pay | Admitting: Physician Assistant

## 2020-08-23 DIAGNOSIS — R232 Flushing: Secondary | ICD-10-CM

## 2020-08-28 DIAGNOSIS — J301 Allergic rhinitis due to pollen: Secondary | ICD-10-CM | POA: Diagnosis not present

## 2020-08-28 DIAGNOSIS — J3089 Other allergic rhinitis: Secondary | ICD-10-CM | POA: Diagnosis not present

## 2020-09-09 DIAGNOSIS — M1711 Unilateral primary osteoarthritis, right knee: Secondary | ICD-10-CM | POA: Diagnosis not present

## 2020-09-10 DIAGNOSIS — J301 Allergic rhinitis due to pollen: Secondary | ICD-10-CM | POA: Diagnosis not present

## 2020-09-10 DIAGNOSIS — J3089 Other allergic rhinitis: Secondary | ICD-10-CM | POA: Diagnosis not present

## 2020-09-17 ENCOUNTER — Encounter: Payer: Self-pay | Admitting: *Deleted

## 2020-09-17 DIAGNOSIS — J301 Allergic rhinitis due to pollen: Secondary | ICD-10-CM | POA: Diagnosis not present

## 2020-09-17 DIAGNOSIS — M1711 Unilateral primary osteoarthritis, right knee: Secondary | ICD-10-CM | POA: Diagnosis not present

## 2020-09-17 DIAGNOSIS — J3089 Other allergic rhinitis: Secondary | ICD-10-CM | POA: Diagnosis not present

## 2020-09-24 DIAGNOSIS — J3089 Other allergic rhinitis: Secondary | ICD-10-CM | POA: Diagnosis not present

## 2020-09-24 DIAGNOSIS — J301 Allergic rhinitis due to pollen: Secondary | ICD-10-CM | POA: Diagnosis not present

## 2020-10-01 DIAGNOSIS — J301 Allergic rhinitis due to pollen: Secondary | ICD-10-CM | POA: Diagnosis not present

## 2020-10-01 DIAGNOSIS — J3089 Other allergic rhinitis: Secondary | ICD-10-CM | POA: Diagnosis not present

## 2020-10-02 ENCOUNTER — Ambulatory Visit: Payer: Medicare HMO

## 2020-10-02 ENCOUNTER — Other Ambulatory Visit: Payer: Self-pay

## 2020-10-08 DIAGNOSIS — J3089 Other allergic rhinitis: Secondary | ICD-10-CM | POA: Diagnosis not present

## 2020-10-08 DIAGNOSIS — J301 Allergic rhinitis due to pollen: Secondary | ICD-10-CM | POA: Diagnosis not present

## 2020-10-16 DIAGNOSIS — J301 Allergic rhinitis due to pollen: Secondary | ICD-10-CM | POA: Diagnosis not present

## 2020-10-16 DIAGNOSIS — J3089 Other allergic rhinitis: Secondary | ICD-10-CM | POA: Diagnosis not present

## 2020-10-22 DIAGNOSIS — J3089 Other allergic rhinitis: Secondary | ICD-10-CM | POA: Diagnosis not present

## 2020-10-22 DIAGNOSIS — J301 Allergic rhinitis due to pollen: Secondary | ICD-10-CM | POA: Diagnosis not present

## 2020-10-29 DIAGNOSIS — J3089 Other allergic rhinitis: Secondary | ICD-10-CM | POA: Diagnosis not present

## 2020-10-29 DIAGNOSIS — J301 Allergic rhinitis due to pollen: Secondary | ICD-10-CM | POA: Diagnosis not present

## 2020-10-30 DIAGNOSIS — M9905 Segmental and somatic dysfunction of pelvic region: Secondary | ICD-10-CM | POA: Diagnosis not present

## 2020-10-30 DIAGNOSIS — M9903 Segmental and somatic dysfunction of lumbar region: Secondary | ICD-10-CM | POA: Diagnosis not present

## 2020-10-30 DIAGNOSIS — M25551 Pain in right hip: Secondary | ICD-10-CM | POA: Diagnosis not present

## 2020-10-30 DIAGNOSIS — M5116 Intervertebral disc disorders with radiculopathy, lumbar region: Secondary | ICD-10-CM | POA: Diagnosis not present

## 2020-10-31 DIAGNOSIS — M9903 Segmental and somatic dysfunction of lumbar region: Secondary | ICD-10-CM | POA: Diagnosis not present

## 2020-10-31 DIAGNOSIS — M5116 Intervertebral disc disorders with radiculopathy, lumbar region: Secondary | ICD-10-CM | POA: Diagnosis not present

## 2020-10-31 DIAGNOSIS — M25551 Pain in right hip: Secondary | ICD-10-CM | POA: Diagnosis not present

## 2020-10-31 DIAGNOSIS — M9905 Segmental and somatic dysfunction of pelvic region: Secondary | ICD-10-CM | POA: Diagnosis not present

## 2020-11-04 DIAGNOSIS — M47816 Spondylosis without myelopathy or radiculopathy, lumbar region: Secondary | ICD-10-CM | POA: Diagnosis not present

## 2020-11-04 DIAGNOSIS — M25551 Pain in right hip: Secondary | ICD-10-CM | POA: Diagnosis not present

## 2020-11-04 DIAGNOSIS — M9905 Segmental and somatic dysfunction of pelvic region: Secondary | ICD-10-CM | POA: Diagnosis not present

## 2020-11-04 DIAGNOSIS — J3089 Other allergic rhinitis: Secondary | ICD-10-CM | POA: Diagnosis not present

## 2020-11-04 DIAGNOSIS — M5116 Intervertebral disc disorders with radiculopathy, lumbar region: Secondary | ICD-10-CM | POA: Diagnosis not present

## 2020-11-04 DIAGNOSIS — J301 Allergic rhinitis due to pollen: Secondary | ICD-10-CM | POA: Diagnosis not present

## 2020-11-04 DIAGNOSIS — M9903 Segmental and somatic dysfunction of lumbar region: Secondary | ICD-10-CM | POA: Diagnosis not present

## 2020-11-05 DIAGNOSIS — M9903 Segmental and somatic dysfunction of lumbar region: Secondary | ICD-10-CM | POA: Diagnosis not present

## 2020-11-05 DIAGNOSIS — M25551 Pain in right hip: Secondary | ICD-10-CM | POA: Diagnosis not present

## 2020-11-05 DIAGNOSIS — M9905 Segmental and somatic dysfunction of pelvic region: Secondary | ICD-10-CM | POA: Diagnosis not present

## 2020-11-05 DIAGNOSIS — M5116 Intervertebral disc disorders with radiculopathy, lumbar region: Secondary | ICD-10-CM | POA: Diagnosis not present

## 2020-11-07 DIAGNOSIS — M25551 Pain in right hip: Secondary | ICD-10-CM | POA: Diagnosis not present

## 2020-11-07 DIAGNOSIS — M5116 Intervertebral disc disorders with radiculopathy, lumbar region: Secondary | ICD-10-CM | POA: Diagnosis not present

## 2020-11-07 DIAGNOSIS — M9903 Segmental and somatic dysfunction of lumbar region: Secondary | ICD-10-CM | POA: Diagnosis not present

## 2020-11-07 DIAGNOSIS — M9905 Segmental and somatic dysfunction of pelvic region: Secondary | ICD-10-CM | POA: Diagnosis not present

## 2020-11-08 DIAGNOSIS — M170 Bilateral primary osteoarthritis of knee: Secondary | ICD-10-CM | POA: Diagnosis not present

## 2020-11-12 DIAGNOSIS — M9905 Segmental and somatic dysfunction of pelvic region: Secondary | ICD-10-CM | POA: Diagnosis not present

## 2020-11-12 DIAGNOSIS — M9903 Segmental and somatic dysfunction of lumbar region: Secondary | ICD-10-CM | POA: Diagnosis not present

## 2020-11-12 DIAGNOSIS — M5116 Intervertebral disc disorders with radiculopathy, lumbar region: Secondary | ICD-10-CM | POA: Diagnosis not present

## 2020-11-12 DIAGNOSIS — M25551 Pain in right hip: Secondary | ICD-10-CM | POA: Diagnosis not present

## 2020-11-13 DIAGNOSIS — M5116 Intervertebral disc disorders with radiculopathy, lumbar region: Secondary | ICD-10-CM | POA: Diagnosis not present

## 2020-11-13 DIAGNOSIS — M25551 Pain in right hip: Secondary | ICD-10-CM | POA: Diagnosis not present

## 2020-11-13 DIAGNOSIS — M9903 Segmental and somatic dysfunction of lumbar region: Secondary | ICD-10-CM | POA: Diagnosis not present

## 2020-11-13 DIAGNOSIS — M9905 Segmental and somatic dysfunction of pelvic region: Secondary | ICD-10-CM | POA: Diagnosis not present

## 2020-11-13 DIAGNOSIS — J301 Allergic rhinitis due to pollen: Secondary | ICD-10-CM | POA: Diagnosis not present

## 2020-11-13 DIAGNOSIS — J3089 Other allergic rhinitis: Secondary | ICD-10-CM | POA: Diagnosis not present

## 2020-11-14 DIAGNOSIS — M9903 Segmental and somatic dysfunction of lumbar region: Secondary | ICD-10-CM | POA: Diagnosis not present

## 2020-11-14 DIAGNOSIS — M5116 Intervertebral disc disorders with radiculopathy, lumbar region: Secondary | ICD-10-CM | POA: Diagnosis not present

## 2020-11-14 DIAGNOSIS — M9905 Segmental and somatic dysfunction of pelvic region: Secondary | ICD-10-CM | POA: Diagnosis not present

## 2020-11-14 DIAGNOSIS — M25551 Pain in right hip: Secondary | ICD-10-CM | POA: Diagnosis not present

## 2020-11-18 DIAGNOSIS — M25551 Pain in right hip: Secondary | ICD-10-CM | POA: Diagnosis not present

## 2020-11-18 DIAGNOSIS — M5116 Intervertebral disc disorders with radiculopathy, lumbar region: Secondary | ICD-10-CM | POA: Diagnosis not present

## 2020-11-18 DIAGNOSIS — M9905 Segmental and somatic dysfunction of pelvic region: Secondary | ICD-10-CM | POA: Diagnosis not present

## 2020-11-18 DIAGNOSIS — M9903 Segmental and somatic dysfunction of lumbar region: Secondary | ICD-10-CM | POA: Diagnosis not present

## 2020-11-19 DIAGNOSIS — J3089 Other allergic rhinitis: Secondary | ICD-10-CM | POA: Diagnosis not present

## 2020-11-19 DIAGNOSIS — R69 Illness, unspecified: Secondary | ICD-10-CM | POA: Diagnosis not present

## 2020-11-19 DIAGNOSIS — J301 Allergic rhinitis due to pollen: Secondary | ICD-10-CM | POA: Diagnosis not present

## 2020-11-19 DIAGNOSIS — M9903 Segmental and somatic dysfunction of lumbar region: Secondary | ICD-10-CM | POA: Diagnosis not present

## 2020-11-19 DIAGNOSIS — M5116 Intervertebral disc disorders with radiculopathy, lumbar region: Secondary | ICD-10-CM | POA: Diagnosis not present

## 2020-11-19 DIAGNOSIS — M25551 Pain in right hip: Secondary | ICD-10-CM | POA: Diagnosis not present

## 2020-11-19 DIAGNOSIS — M9905 Segmental and somatic dysfunction of pelvic region: Secondary | ICD-10-CM | POA: Diagnosis not present

## 2020-11-25 DIAGNOSIS — M9905 Segmental and somatic dysfunction of pelvic region: Secondary | ICD-10-CM | POA: Diagnosis not present

## 2020-11-25 DIAGNOSIS — M9903 Segmental and somatic dysfunction of lumbar region: Secondary | ICD-10-CM | POA: Diagnosis not present

## 2020-11-25 DIAGNOSIS — M5116 Intervertebral disc disorders with radiculopathy, lumbar region: Secondary | ICD-10-CM | POA: Diagnosis not present

## 2020-11-25 DIAGNOSIS — M25551 Pain in right hip: Secondary | ICD-10-CM | POA: Diagnosis not present

## 2020-11-26 DIAGNOSIS — M25551 Pain in right hip: Secondary | ICD-10-CM | POA: Diagnosis not present

## 2020-11-26 DIAGNOSIS — M9905 Segmental and somatic dysfunction of pelvic region: Secondary | ICD-10-CM | POA: Diagnosis not present

## 2020-11-26 DIAGNOSIS — M5116 Intervertebral disc disorders with radiculopathy, lumbar region: Secondary | ICD-10-CM | POA: Diagnosis not present

## 2020-11-26 DIAGNOSIS — M9903 Segmental and somatic dysfunction of lumbar region: Secondary | ICD-10-CM | POA: Diagnosis not present

## 2020-11-27 DIAGNOSIS — J301 Allergic rhinitis due to pollen: Secondary | ICD-10-CM | POA: Diagnosis not present

## 2020-11-27 DIAGNOSIS — J3089 Other allergic rhinitis: Secondary | ICD-10-CM | POA: Diagnosis not present

## 2020-12-03 DIAGNOSIS — M25551 Pain in right hip: Secondary | ICD-10-CM | POA: Diagnosis not present

## 2020-12-03 DIAGNOSIS — M9905 Segmental and somatic dysfunction of pelvic region: Secondary | ICD-10-CM | POA: Diagnosis not present

## 2020-12-03 DIAGNOSIS — M9903 Segmental and somatic dysfunction of lumbar region: Secondary | ICD-10-CM | POA: Diagnosis not present

## 2020-12-03 DIAGNOSIS — M5116 Intervertebral disc disorders with radiculopathy, lumbar region: Secondary | ICD-10-CM | POA: Diagnosis not present

## 2020-12-04 DIAGNOSIS — J3089 Other allergic rhinitis: Secondary | ICD-10-CM | POA: Diagnosis not present

## 2020-12-04 DIAGNOSIS — J301 Allergic rhinitis due to pollen: Secondary | ICD-10-CM | POA: Diagnosis not present

## 2020-12-05 DIAGNOSIS — M9903 Segmental and somatic dysfunction of lumbar region: Secondary | ICD-10-CM | POA: Diagnosis not present

## 2020-12-05 DIAGNOSIS — M9905 Segmental and somatic dysfunction of pelvic region: Secondary | ICD-10-CM | POA: Diagnosis not present

## 2020-12-05 DIAGNOSIS — M5116 Intervertebral disc disorders with radiculopathy, lumbar region: Secondary | ICD-10-CM | POA: Diagnosis not present

## 2020-12-05 DIAGNOSIS — M25551 Pain in right hip: Secondary | ICD-10-CM | POA: Diagnosis not present

## 2020-12-10 DIAGNOSIS — J301 Allergic rhinitis due to pollen: Secondary | ICD-10-CM | POA: Diagnosis not present

## 2020-12-10 DIAGNOSIS — M9903 Segmental and somatic dysfunction of lumbar region: Secondary | ICD-10-CM | POA: Diagnosis not present

## 2020-12-10 DIAGNOSIS — M5116 Intervertebral disc disorders with radiculopathy, lumbar region: Secondary | ICD-10-CM | POA: Diagnosis not present

## 2020-12-10 DIAGNOSIS — J3089 Other allergic rhinitis: Secondary | ICD-10-CM | POA: Diagnosis not present

## 2020-12-10 DIAGNOSIS — M25551 Pain in right hip: Secondary | ICD-10-CM | POA: Diagnosis not present

## 2020-12-10 DIAGNOSIS — M9905 Segmental and somatic dysfunction of pelvic region: Secondary | ICD-10-CM | POA: Diagnosis not present

## 2020-12-18 DIAGNOSIS — M5116 Intervertebral disc disorders with radiculopathy, lumbar region: Secondary | ICD-10-CM | POA: Diagnosis not present

## 2020-12-18 DIAGNOSIS — M25551 Pain in right hip: Secondary | ICD-10-CM | POA: Diagnosis not present

## 2020-12-18 DIAGNOSIS — M9905 Segmental and somatic dysfunction of pelvic region: Secondary | ICD-10-CM | POA: Diagnosis not present

## 2020-12-18 DIAGNOSIS — M9903 Segmental and somatic dysfunction of lumbar region: Secondary | ICD-10-CM | POA: Diagnosis not present

## 2020-12-19 DIAGNOSIS — J3089 Other allergic rhinitis: Secondary | ICD-10-CM | POA: Diagnosis not present

## 2020-12-19 DIAGNOSIS — J301 Allergic rhinitis due to pollen: Secondary | ICD-10-CM | POA: Diagnosis not present

## 2020-12-24 DIAGNOSIS — J301 Allergic rhinitis due to pollen: Secondary | ICD-10-CM | POA: Diagnosis not present

## 2020-12-24 DIAGNOSIS — J3089 Other allergic rhinitis: Secondary | ICD-10-CM | POA: Diagnosis not present

## 2020-12-27 ENCOUNTER — Encounter: Payer: Medicare HMO | Admitting: Physician Assistant

## 2020-12-27 ENCOUNTER — Encounter: Payer: Medicare HMO | Admitting: Adult Health

## 2020-12-31 ENCOUNTER — Encounter: Payer: Self-pay | Admitting: Adult Health

## 2020-12-31 DIAGNOSIS — J3089 Other allergic rhinitis: Secondary | ICD-10-CM | POA: Diagnosis not present

## 2020-12-31 DIAGNOSIS — J301 Allergic rhinitis due to pollen: Secondary | ICD-10-CM | POA: Diagnosis not present

## 2020-12-31 NOTE — Progress Notes (Signed)
COMPLETE PHYSICAL Assessment:   Essential hypertension - continue medications, DASH diet, exercise and monitor at home. Call if greater than 130/80.  -     CBC with Differential/Platelet -     COMPLETE METABOLIC PANEL WITH GFR -     TSH  -     EKG -     UA, microalbumin  Type 2 diabetes mellitus with CKD, without long-term current use of insulin ([place]) Discussed general issues about diabetes pathophysiology and management., Educational material distributed., Suggested low cholesterol diet., Encouraged aerobic exercise., Discussed foot care., Reminded to get yearly retinal exam. -     Hemoglobin A1c -     CMP/GFR  Mixed hyperlipidemia associated with T2DM ([name]) check lipids LDL Goal <70; discussed and plan to initiate low dose rosuvastatin today;  decrease fatty foods increase activity. -     Lipid panel  Medication management -     Magnesium  Vitamin D deficiency -     VITAMIN D 25 Hydroxy (Vit-D Deficiency, Fractures)  Age related osteoporosis, unspecified pathological fracture presence - on fosamax x 06/2017- will stop due to jaw pain - however based on discussion today, low suspicion r/t fosamax; will retry if indicated by next exam  - recheck DEXA 06/2021 - ordered to schedule with next mammogram  Obesity - follow up 3 months for progress monitoring - increase veggies, decrease carbs - long discussion about weight loss, diet, and exercise  Right-sided low back pain without sciatica, unspecified chronicity Didn't improve with ortho injections/nerve ablation  improved with chiropractic visits; continue, monitor   Gastroesophageal reflux disease, esophagitis presence not specified Continue PPI/H2 blocker, diet discussed  Uncomplicated asthma, unspecified asthma severity, unspecified whether persistent Monitor  Lipoma of right upper extremity She reports intermittent but increasing discomfort associated with soft tissue mass of post right shoulder present and monitored  for many years Interested in pursuing referral for removal later this year Declines today due to current Omicron surge;  Plan gen surgery referral later this year  Hot flashes Has failed lexapro, paxil, effexor; GYN stopped estrogen; risks discussed Suggested estroven complete; may have some benefit evening primrose, black cohosh, soy isoflavones; she also has follow up with GYN planned later this year, encouraged to discuss if no improvement  Orders Placed This Encounter  Procedures  . DG Bone Density  . CBC with Differential/Platelet  . COMPLETE METABOLIC PANEL WITH GFR  . Magnesium  . Lipid panel  . TSH  . Hemoglobin A1c  . VITAMIN D 25 Hydroxy (Vit-D Deficiency, Fractures)  . Microalbumin / creatinine urine ratio  . Urinalysis, Routine w reflex microscopic  . Iron, TIBC and Ferritin Panel  . EKG 12-Lead  . HM DIABETES FOOT EXAM    Over 40 minutes of exam, counseling, chart review, and critical decision making was performed  Future Appointments  Date Time Provider [place]  04/03/2021 10:30 AM Unk Pinto, MD [facility] None  07/07/2021 10:30 AM Liane Comber, NP [facility] None  01/01/2022  3:00 PM Liane Comber, NP [facility] None     Subjective:  Lauren Lopez is a 70 y.o. AA female who presents for cpe. She has Essential hypertension; GERD (gastroesophageal reflux disease); Vitamin D deficiency; Asthma; Medication management; Back pain; Osteoporosis; Obesity (BMI 30.0-34.9); Hyperlipidemia associated with type 2 diabetes mellitus ([place]); CKD stage 2 due to type 2 diabetes mellitus ([place]); Type 2 diabetes mellitus with stage 2 chronic kidney disease, without long-term current use of insulin ([place]); and Lipoma of right upper extremity on  their problem list.   She is married, 2 grown daughter, 2 grandchildren live in [place], [place]. She is retired Scientist, forensic associated. Husband has leukemia, they are monitoring it, he is having dementia as well.   She is s/p  TAH, did have follow up with GYN, no issues. Plans to follow up again this year.   She was following with ortho for her back pain, following with Dr. Ernestina Patches and has had nerve abation and injections with limited benefit. She had negative PTH. She had negative RA workup in 2017. Recently following with chiropractic for back with improvement. She reports having muscle cramps and spasms in the last 3 weeks.   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 Reports pain has since resolved.   BMI is Body mass index is 33.88 kg/m., she has been working on diet and exercise. Wt Readings from Last 3 Encounters:  01/01/21 192 lb 12.8 oz (87.5 kg)  08/01/20 193 lb (87.5 kg)  05/01/20 193 lb (87.5 kg)   Her blood pressure has been controlled at home, today their BP is BP: 120/82 She does workout.  She denies chest pain, shortness of breath, dizziness.   She is not on cholesterol medication (taking RYRS only) and denies myalgias. Her cholesterol is not at goal. The cholesterol last visit was:   Lab Results  Component Value Date   CHOL 234 (H) 08/01/2020   HDL 89 08/01/2020   LDLCALC 128 (H) 08/01/2020   TRIG 74 08/01/2020   CHOLHDL 2.6 08/01/2020   She has been working on diet and exercise for diabetes with CKD, controlled with diet and metformin, she has worked hard to get it down, and denies foot ulcerations, hyperglycemia, hypoglycemia , increased appetite, nausea, paresthesia of the feet, polydipsia, polyuria, vomiting and weight loss. Last A1C in the office was:  Lab Results  Component Value Date   HGBA1C 6.4 (H) 08/01/2020   Last GFR` Lab Results  Component Value Date   GFRAA 85 08/01/2020   Patient is on  Vitamin D supplement, 10000 IU daily  Lab Results  Component Value Date   VD25OH 24 08/01/2020   She continues on B12 supplement Lab Results  Component Value Date   VITAMINB12 >2,000 (H) 12/22/2018     Medication Review:  Current Outpatient Medications (Endocrine & Metabolic):  .  metFORMIN (GLUCOPHAGE) 500 MG tablet, Take 1 tablet  3 x /day with Meals for Diabetes (Patient taking differently: Take 1 tablet  2 x /day with Meals for Diabetes)  Current Outpatient Medications (Cardiovascular):  .  hydrochlorothiazide (HYDRODIURIL) 25 MG tablet, TAKE 1 TABLET BY MOUTH EVERY DAY  Current Outpatient Medications (Respiratory):  .  fexofenadine (ALLEGRA) 180 MG tablet, Take 1 tablet (180 mg total) by mouth daily. .  fluticasone-salmeterol (ADVAIR HFA) 115-21 MCG/ACT inhaler, Inhale 2 puffs into the lungs 2 (two) times daily.   Current Outpatient Medications (Hematological):  .  cyanocobalamin 100 MCG tablet, Take 100 mcg by mouth daily.  Current Outpatient Medications (Other):  .  Apoaequorin (PREVAGEN PO), Take by mouth. .  blood glucose meter kit and supplies, Test sugars twice daily. Dx E11.9 .  calcium carbonate (OS-CAL) 600 MG TABS tablet, Take  600 mg by mouth daily. .  Cholecalciferol (VITAMIN D3) 5000 UNITS TABS, Take 10,000 Units by mouth daily. .  Lancets (ONETOUCH DELICA PLUS CVELFY10F) MISC, TEST SUGAR TWICE A DAY .  MAGNESIUM PO, Take 500 mg by mouth daily.  Marland Kitchen  omeprazole (PRILOSEC) 40 MG capsule, TAKE 1 CAPSULE DAILY FOR INDIGESTION & HEARTBURN .  ONETOUCH ULTRA test strip, TEST SUGAR TWICE A DAY .  Potassium 99 MG TABS, Take by mouth 2 (two) times daily. .  Red Yeast Rice Extract (RED YEAST RICE PO), Take 2,000 mg by mouth daily. .  cyclobenzaprine (FLEXERIL) 10 MG tablet, Take 1 tablet (10 mg total) by mouth at bedtime as needed for muscle spasms.  Allergies: Allergies  Allergen Reactions  . Metronidazole Other (See Comments)    unknown  . Pneumovax 23  [Pneumococcal Vac Polyvalent]     Redness and severe swelling at injection site  . Ppd [Tuberculin Purified Protein Derivative] Other (See Comments)    + PPD 2013 with NEG CXR 05/2012    Current Problems (verified) has Essential hypertension; GERD (gastroesophageal reflux disease); Vitamin D deficiency; Asthma; Medication management; Back pain; Osteoporosis; Obesity (BMI 30.0-34.9); Hyperlipidemia associated with type 2 diabetes mellitus ([place]); CKD stage 2 due to type 2 diabetes mellitus ([place]); Type 2 diabetes mellitus with stage 2 chronic kidney disease, without long-term current use of insulin ([name]); and Lipoma of right upper extremity on their problem list.  Screening Tests Immunization History  Administered Date(s) Administered  . Influenza, High Dose Seasonal PF 11/10/2017, 09/15/2018, 09/04/2019  . Influenza, Seasonal, Injecte, Preservative Fre 10/17/2015  . Influenza,inj,quad, With Preservative 10/15/2016  . Influenza-Unspecified 08/30/2020  . PFIZER(Purple Top)SARS-COV-2 Vaccination 12/22/2019, 01/12/2020  . Pneumococcal Conjugate-13 12/22/2018  . Pneumococcal Polysaccharide-23 02/04/2017  . Pneumococcal-Unspecified 06/27/2013  . Tdap 04/23/2012  . Zoster 07/31/2015    Preventative care: Last colonoscopy: 2019 had 6 polyps, 5 years Dr. Earlean Shawl  Pap: Hysterectomy, DONE Mammogram:  06/2020 goes to [facility] DEXA: 07/2019 osteopenia, at [facility] started fosamax 06/2017 but stopped due to jaw pain however doesn't believe this was r/t fosamax  Tetanus: 2013 Influenza: 08/2020 Pneumonia: 2/2, done Shingles - zoster, 2016 Covid 19: 3/3, 2021, pfizer  Names of Other Physician/Practitioners you currently use: 1.  [facility] here for primary care 2. Dr. Katy Fitch, eye doctor, last visit 07/03/2020 abstracted, no retinopathy  3. Dr. Mariea Clonts  , dentist, last visit 10/2020  Patient Care Team: Unk Pinto, MD as PCP - General (Internal  Medicine) Sypher, Herbie Baltimore, MD (Inactive) as Consulting Physician (Orthopedic Surgery) Monna Fam, MD as Consulting Physician (Ophthalmology) Richmond Campbell, MD as Consulting Physician (Gastroenterology) Cheri Fowler, MD as Consulting Physician (Obstetrics and Gynecology) Gaynelle Arabian, MD as Consulting Physician (Orthopedic Surgery) Marchia Bond, MD as Consulting Physician (Orthopedic Surgery) Mosetta Anis, MD as Referring Physician (Allergy) Larey Dresser, MD as Consulting Physician (Cardiology)  Surgical: She  has a past surgical history that includes Bilateral salpingoophorectomy (11/28/2010); Knee arthroscopy (Bilateral, 2005-2016); Colonoscopy; Carpal tunnel release (07/15/2012); Shoulder arthroscopy with rotator cuff repair and subacromial decompression (Right, 01/20/2013); Abdominal hysterectomy; Carpal tunnel release (Right, 05/31/2014); and Tubal ligation. Family Her family history includes Alzheimer's disease in her father; Cirrhosis in her brother; Dementia in her mother; Diabetes in her mother; Hypertension in her brother, father, and mother; Tongue cancer in her sister. Social history  She reports that she has never smoked. She has never used smokeless tobacco. She reports that she does not drink alcohol and does not use drugs.  Objective:   Today's Vitals   01/01/21 1504  BP: 120/82  Pulse: 79  Temp: (!) 97.5 F (36.4 C)  SpO2: 98%  Weight: 192 lb 12.8 oz (87.5 kg)  Height: 5' 3.25" (1.607 m)   Body mass index is 33.88 kg/m.  General appearance: alert, no distress, WD/WN, female HEENT: normocephalic, sclerae anicteric, TMs pearly, nares patent, no discharge or erythema, pharynx normal Oral cavity: MMM, no lesions Neck: supple, no lymphadenopathy, no thyromegaly, no masses Heart: RRR, normal S1, S2, no murmurs Lungs: CTA bilaterally, no wheezes, rhonchi, or rales Abdomen: +bs, soft, non tender, non distended, no masses, no hepatomegaly, no  splenomegaly Musculoskeletal: nontender, no swelling, no obvious deformity, Patient is able to ambulate well. Gait is not  Antalgic. Straight leg raising with dorsiflexion negative bilaterally for radicular symptoms. Sensory exam in the legs are normal. Knee reflexes are normal Ankle reflexes are normal Strength is normal and symmetric in arms and legs.  Extremities: no edema, no cyanosis, no clubbing. She has approx 8 cm soft tissue mass to post R shoulder, suspect lipoma Pulses: 2+ symmetric, upper and lower extremities, normal cap refill Neurological: alert, oriented x 3, CN2-12 intact, strength normal upper extremities and lower extremities, sensation normal throughout, DTRs 2+ throughout, no cerebellar signs, gait normal Psychiatric: normal affect, behavior normal, pleasant  Breasts: defer to GYN GU: defer to GYN  Diabetic Foot Exam - Simple   Simple Foot Form Diabetic Foot exam was performed with the following findings: Yes 01/01/2021  4:06 PM  Visual Inspection No deformities, no ulcerations, no other skin breakdown bilaterally: Yes Sensation Testing Intact to touch and monofilament testing bilaterally: Yes Pulse Check Posterior Tibialis and Dorsalis pulse intact bilaterally: Yes Comments    EKG - NSR   Izora Ribas, NP   01/01/2021

## 2021-01-01 ENCOUNTER — Other Ambulatory Visit: Payer: Self-pay

## 2021-01-01 ENCOUNTER — Encounter: Payer: Self-pay | Admitting: Adult Health

## 2021-01-01 ENCOUNTER — Ambulatory Visit (INDEPENDENT_AMBULATORY_CARE_PROVIDER_SITE_OTHER): Payer: Medicare HMO | Admitting: Adult Health

## 2021-01-01 VITALS — BP 120/82 | HR 79 | Temp 97.5°F | Ht 63.25 in | Wt 192.8 lb

## 2021-01-01 DIAGNOSIS — M810 Age-related osteoporosis without current pathological fracture: Secondary | ICD-10-CM

## 2021-01-01 DIAGNOSIS — Z0000 Encounter for general adult medical examination without abnormal findings: Secondary | ICD-10-CM

## 2021-01-01 DIAGNOSIS — K219 Gastro-esophageal reflux disease without esophagitis: Secondary | ICD-10-CM

## 2021-01-01 DIAGNOSIS — I10 Essential (primary) hypertension: Secondary | ICD-10-CM

## 2021-01-01 DIAGNOSIS — E1122 Type 2 diabetes mellitus with diabetic chronic kidney disease: Secondary | ICD-10-CM

## 2021-01-01 DIAGNOSIS — E785 Hyperlipidemia, unspecified: Secondary | ICD-10-CM

## 2021-01-01 DIAGNOSIS — E1169 Type 2 diabetes mellitus with other specified complication: Secondary | ICD-10-CM

## 2021-01-01 DIAGNOSIS — N182 Chronic kidney disease, stage 2 (mild): Secondary | ICD-10-CM | POA: Diagnosis not present

## 2021-01-01 DIAGNOSIS — Z1389 Encounter for screening for other disorder: Secondary | ICD-10-CM

## 2021-01-01 DIAGNOSIS — E669 Obesity, unspecified: Secondary | ICD-10-CM

## 2021-01-01 DIAGNOSIS — E559 Vitamin D deficiency, unspecified: Secondary | ICD-10-CM

## 2021-01-01 DIAGNOSIS — Z136 Encounter for screening for cardiovascular disorders: Secondary | ICD-10-CM

## 2021-01-01 DIAGNOSIS — M545 Low back pain, unspecified: Secondary | ICD-10-CM

## 2021-01-01 DIAGNOSIS — J45909 Unspecified asthma, uncomplicated: Secondary | ICD-10-CM

## 2021-01-01 DIAGNOSIS — Z79899 Other long term (current) drug therapy: Secondary | ICD-10-CM | POA: Diagnosis not present

## 2021-01-01 DIAGNOSIS — E66811 Obesity, class 1: Secondary | ICD-10-CM

## 2021-01-01 DIAGNOSIS — Z1329 Encounter for screening for other suspected endocrine disorder: Secondary | ICD-10-CM

## 2021-01-01 DIAGNOSIS — D649 Anemia, unspecified: Secondary | ICD-10-CM

## 2021-01-01 MED ORDER — CYCLOBENZAPRINE HCL 10 MG PO TABS: 10.0000 mg | ORAL_TABLET | Freq: Every evening | ORAL | 0 refills | Status: DC | PRN

## 2021-01-01 NOTE — Patient Instructions (Addendum)
Lauren Lopez , Thank you for taking time to come for your Annual Wellness Visit. I appreciate your ongoing commitment to your health goals. Please review the following plan we discussed and let me know if I can assist you in the future.   These are the goals we discussed: Goals    . Fasting Blood Glucose <130    . LDL CALC < 70       This is a list of the screening recommended for you and due dates:  Health Maintenance  Topic Date Due  . COVID-19 Vaccine (3 - Booster for Pfizer series) 10/27/2020  . Urine Protein Check  12/27/2020  . Hemoglobin A1C  01/29/2021  . Eye exam for diabetics  07/04/2021  . Complete foot exam   01/01/2022  . Tetanus Vaccine  04/23/2022  . Mammogram  07/12/2022  . Colon Cancer Screening  09/09/2023  . Flu Shot  Completed  . DEXA scan (bone density measurement)  Completed  .  Hepatitis C: One time screening is recommended by Center for Disease Control  (CDC) for  adults born from [birth year] through 1965.   Completed  . Pneumonia vaccines  Completed     Try estroven complete -   Be sure eating high calcium and K2 diet (high in greens)  Continue vitamin D supplement as recommended  Will likely recommend to start rosuvastatin for cholesterol if LDL is 70+ to minimize stroke, heart attack and vascular dementia risk with diabetes     Know what a healthy weight is for you (roughly BMI <25) and aim to maintain this  Aim for 7+ servings of fruits and vegetables daily  65-80+ fluid ounces of water or unsweet tea for healthy kidneys  Limit to max 1 drink of alcohol per day; avoid smoking/tobacco  Limit animal fats in diet for cholesterol and heart health - choose grass fed whenever available  Avoid highly processed foods, and foods high in saturated/trans fats  Aim for low stress - take time to unwind and care for your mental health  Aim for 150 min of moderate intensity exercise weekly for heart health, and weights twice weekly for bone health  Aim  for 7-9 hours of sleep daily    Rosuvastatin Tablets What is this medicine? ROSUVASTATIN (roe SOO va sta tin) is known as a HMG-CoA reductase inhibitor or 'statin'. It lowers cholesterol and triglycerides in the blood. This drug may also reduce the risk of heart attack, stroke, or other health problems in patients with risk factors for heart disease. Diet and lifestyle changes are often used with this drug. This medicine may be used for other purposes; ask your health care provider or pharmacist if you have questions. COMMON BRAND NAME(S): Crestor What should I tell my health care provider before I take this medicine? They need to know if you have any of these conditions:  diabetes  if you often drink alcohol  history of stroke  kidney disease  liver disease  muscle aches or weakness  thyroid disease  an unusual or allergic reaction to rosuvastatin, other medicines, foods, dyes, or preservatives  pregnant or trying to get pregnant  breast-feeding How should I use this medicine? Take this medicine by mouth with a glass of water. Follow the directions on the prescription label. Do not cut, crush or chew this medicine. You can take this medicine with or without food. Take your doses at regular intervals. Do not take your medicine more often than directed. Talk to your pediatrician  regarding the use of this medicine in children. While this drug may be prescribed for children as young as 65 years old for selected conditions, precautions do apply. Overdosage: If you think you have taken too much of this medicine contact a poison control center or emergency room at once. NOTE: This medicine is only for you. Do not share this medicine with others. What if I miss a dose? If you miss a dose, take it as soon as you can. If your next dose is to be taken in less than 12 hours, then do not take the missed dose. Take the next dose at your regular time. Do not take double or extra doses. What  may interact with this medicine? Do not take this medicine with any of the following medications:  herbal medicines like red yeast rice This medicine may also interact with the following medications:  alcohol  antacids containing aluminum hydroxide or magnesium hydroxide  cyclosporine  other medicines for high cholesterol  some medicines for HIV infection  warfarin This list may not describe all possible interactions. Give your health care provider a list of all the medicines, herbs, non-prescription drugs, or dietary supplements you use. Also tell them if you smoke, drink alcohol, or use illegal drugs. Some items may interact with your medicine. What should I watch for while using this medicine? Visit your doctor or health care professional for regular check-ups. You may need regular tests to make sure your liver is working properly. Your health care professional may tell you to stop taking this medicine if you develop muscle problems. If your muscle problems do not go away after stopping this medicine, contact your health care professional. Do not become pregnant while taking this medicine. Women should inform their health care professional if they wish to become pregnant or think they might be pregnant. There is a potential for serious side effects to an unborn child. Talk to your health care professional or pharmacist for more information. Do not breast-feed an infant while taking this medicine. This medicine may increase blood sugar. Ask your healthcare provider if changes in diet or medicines are needed if you have diabetes. If you are going to need surgery or other procedure, tell your doctor that you are using this medicine. This drug is only part of a total heart-health program. Your doctor or a dietician can suggest a low-cholesterol and low-fat diet to help. Avoid alcohol and smoking, and keep a proper exercise schedule. This medicine may cause a decrease in Co-Enzyme Q-10. You  should make sure that you get enough Co-Enzyme Q-10 while you are taking this medicine. Discuss the foods you eat and the vitamins you take with your health care professional. What side effects may I notice from receiving this medicine? Side effects that you should report to your doctor or health care professional as soon as possible:  allergic reactions like skin rash, itching or hives, swelling of the face, lips, or tongue  confusion  joint pain  loss of memory  redness, blistering, peeling or loosening of the skin, including inside the mouth  signs and symptoms of high blood sugar such as being more thirsty or hungry or having to urinate more than normal. You may also feel very tired or have blurry vision.  signs and symptoms of muscle injury like dark urine; trouble passing urine or change in the amount of urine; unusually weak or tired; muscle pain or side or back pain  yellowing of the eyes or skin Side  effects that usually do not require medical attention (report to your doctor or health care professional if they continue or are bothersome):  constipation  diarrhea  dizziness  gas  headache  nausea  stomach pain  trouble sleeping  upset stomach This list may not describe all possible side effects. Call your doctor for medical advice about side effects. You may report side effects to FDA at 1-800-FDA-1088. Where should I keep my medicine? Keep out of the reach of children and pets. Store between 20 and 25 degrees C (68 and 77 degrees F). Get rid of any unused medicine after the expiration date. To get rid of medicines that are no longer needed or have expired:  Take the medicine to a medicine take-back program. Check with your pharmacy or law enforcement to find a location.  If you cannot return the medicine, check the label or package insert to see if the medicine should be thrown out in the garbage or flushed down the toilet. If you are not sure, ask your health  care provider. If it is safe to put it in the trash, take the medicine out of the container. Mix the medicine with cat litter, dirt, coffee grounds, or other unwanted substance. Seal the mixture in a bag or container. Put it in the trash. NOTE: This sheet is a summary. It may not cover all possible information. If you have questions about this medicine, talk to your doctor, pharmacist, or health care provider.  2021 Elsevier/Gold Standard (2020-09-29 09:55:07)      Health Maintenance for Postmenopausal Women Menopause is a normal process in which your ability to get pregnant comes to an end. This process happens slowly over many months or years, usually between the ages of 33 and 26. Menopause is complete when you have missed your menstrual periods for 12 months. It is important to talk with your health care provider about some of the most common conditions that affect women after menopause (postmenopausal women). These include heart disease, cancer, and bone loss (osteoporosis). Adopting a healthy lifestyle and getting preventive care can help to promote your health and wellness. The actions you take can also lower your chances of developing some of these common conditions. What should I know about menopause? During menopause, you may get a number of symptoms, such as:  Hot flashes. These can be moderate or severe.  Night sweats.  Decrease in sex drive.  Mood swings.  Headaches.  Tiredness.  Irritability.  Memory problems.  Insomnia. Choosing to treat or not to treat these symptoms is a decision that you make with your health care provider. Do I need hormone replacement therapy?  Hormone replacement therapy is effective in treating symptoms that are caused by menopause, such as hot flashes and night sweats.  Hormone replacement carries certain risks, especially as you become older. If you are thinking about using estrogen or estrogen with progestin, discuss the benefits and  risks with your health care provider. What is my risk for heart disease and stroke? The risk of heart disease, heart attack, and stroke increases as you age. One of the causes may be a change in the body's hormones during menopause. This can affect how your body uses dietary fats, triglycerides, and cholesterol. Heart attack and stroke are medical emergencies. There are many things that you can do to help prevent heart disease and stroke. Watch your blood pressure  High blood pressure causes heart disease and increases the risk of stroke. This is more likely to  develop in people who have high blood pressure readings, are of African descent, or are overweight.  Have your blood pressure checked: ? Every 3-5 years if you are 73-78 years of age. ? Every year if you are 71 years old or older. Eat a healthy diet  Eat a diet that includes plenty of vegetables, fruits, low-fat dairy products, and lean protein.  Do not eat a lot of foods that are high in solid fats, added sugars, or sodium.   Get regular exercise Get regular exercise. This is one of the most important things you can do for your health. Most adults should:  Try to exercise for at least 150 minutes each week. The exercise should increase your heart rate and make you sweat (moderate-intensity exercise).  Try to do strengthening exercises at least twice each week. Do these in addition to the moderate-intensity exercise.  Spend less time sitting. Even light physical activity can be beneficial. Other tips  Work with your health care provider to achieve or maintain a healthy weight.  Do not use any products that contain nicotine or tobacco, such as cigarettes, e-cigarettes, and chewing tobacco. If you need help quitting, ask your health care provider.  Know your numbers. Ask your health care provider to check your cholesterol and your blood sugar (glucose). Continue to have your blood tested as directed by your health care provider. Do  I need screening for cancer? Depending on your health history and family history, you may need to have cancer screening at different stages of your life. This may include screening for:  Breast cancer.  Cervical cancer.  Lung cancer.  Colorectal cancer. What is my risk for osteoporosis? After menopause, you may be at increased risk for osteoporosis. Osteoporosis is a condition in which bone destruction happens more quickly than new bone creation. To help prevent osteoporosis or the bone fractures that can happen because of osteoporosis, you may take the following actions:  If you are 38-57 years old, get at least 1,000 mg of calcium and at least 600 mg of vitamin D per day.  If you are older than age 73 but younger than age 39, get at least 1,200 mg of calcium and at least 600 mg of vitamin D per day.  If you are older than age 71, get at least 1,200 mg of calcium and at least 800 mg of vitamin D per day. Smoking and drinking excessive alcohol increase the risk of osteoporosis. Eat foods that are rich in calcium and vitamin D, and do weight-bearing exercises several times each week as directed by your health care provider. How does menopause affect my mental health? Depression may occur at any age, but it is more common as you become older. Common symptoms of depression include:  Low or sad mood.  Changes in sleep patterns.  Changes in appetite or eating patterns.  Feeling an overall lack of motivation or enjoyment of activities that you previously enjoyed.  Frequent crying spells. Talk with your health care provider if you think that you are experiencing depression. General instructions See your health care provider for regular wellness exams and vaccines. This may include:  Scheduling regular health, dental, and eye exams.  Getting and maintaining your vaccines. These include: ? Influenza vaccine. Get this vaccine each year before the flu season begins. ? Pneumonia  vaccine. ? Shingles vaccine. ? Tetanus, diphtheria, and pertussis (Tdap) booster vaccine. Your health care provider may also recommend other immunizations. Tell your health care provider if you  have ever been abused or do not feel safe at home. Summary  Menopause is a normal process in which your ability to get pregnant comes to an end.  This condition causes hot flashes, night sweats, decreased interest in sex, mood swings, headaches, or lack of sleep.  Treatment for this condition may include hormone replacement therapy.  Take actions to keep yourself healthy, including exercising regularly, eating a healthy diet, watching your weight, and checking your blood pressure and blood sugar levels.  Get screened for cancer and depression. Make sure that you are up to date with all your vaccines. This information is not intended to replace advice given to you by your health care provider. Make sure you discuss any questions you have with your health care provider. Document Revised: 11/09/2018 Document Reviewed: 11/09/2018 Elsevier Patient Education  2021 [employer].

## 2021-01-02 DIAGNOSIS — M25551 Pain in right hip: Secondary | ICD-10-CM | POA: Diagnosis not present

## 2021-01-02 DIAGNOSIS — M9905 Segmental and somatic dysfunction of pelvic region: Secondary | ICD-10-CM | POA: Diagnosis not present

## 2021-01-02 DIAGNOSIS — M5116 Intervertebral disc disorders with radiculopathy, lumbar region: Secondary | ICD-10-CM | POA: Diagnosis not present

## 2021-01-02 DIAGNOSIS — M9903 Segmental and somatic dysfunction of lumbar region: Secondary | ICD-10-CM | POA: Diagnosis not present

## 2021-01-02 LAB — CBC WITH DIFFERENTIAL/PLATELET
Absolute Monocytes: 455 cells/uL (ref 200–950)
Basophils Absolute: 21 cells/uL (ref 0–200)
Basophils Relative: 0.3 %
Eosinophils Absolute: 110 cells/uL (ref 15–500)
Eosinophils Relative: 1.6 %
HCT: 39.3 % (ref 35.0–45.0)
Hemoglobin: 12.5 g/dL (ref 11.7–15.5)
Lymphs Abs: 3443 cells/uL (ref 850–3900)
MCH: 27.5 pg (ref 27.0–33.0)
MCHC: 31.8 g/dL — ABNORMAL LOW (ref 32.0–36.0)
MCV: 86.4 fL (ref 80.0–100.0)
MPV: 11.5 fL (ref 7.5–12.5)
Monocytes Relative: 6.6 %
Neutro Abs: 2870 cells/uL (ref 1500–7800)
Neutrophils Relative %: 41.6 %
Platelets: 240 10*3/uL (ref 140–400)
RBC: 4.55 10*6/uL (ref 3.80–5.10)
RDW: 13.8 % (ref 11.0–15.0)
Total Lymphocyte: 49.9 %
WBC: 6.9 10*3/uL (ref 3.8–10.8)

## 2021-01-02 LAB — VITAMIN D 25 HYDROXY (VIT D DEFICIENCY, FRACTURES): Vit D, 25-Hydroxy: 59 ng/mL (ref 30–100)

## 2021-01-02 LAB — URINALYSIS, ROUTINE W REFLEX MICROSCOPIC
Bilirubin Urine: NEGATIVE
Glucose, UA: NEGATIVE
Hgb urine dipstick: NEGATIVE
Ketones, ur: NEGATIVE
Leukocytes,Ua: NEGATIVE
Nitrite: NEGATIVE
Protein, ur: NEGATIVE
Specific Gravity, Urine: 1.013 (ref 1.001–1.03)
pH: 6.5 (ref 5.0–8.0)

## 2021-01-02 LAB — COMPLETE METABOLIC PANEL WITH GFR
AG Ratio: 1.9 (calc) (ref 1.0–2.5)
ALT: 18 U/L (ref 6–29)
AST: 18 U/L (ref 10–35)
Albumin: 4.5 g/dL (ref 3.6–5.1)
Alkaline phosphatase (APISO): 115 U/L (ref 37–153)
BUN: 14 mg/dL (ref 7–25)
CO2: 31 mmol/L (ref 20–32)
Calcium: 9.9 mg/dL (ref 8.6–10.4)
Chloride: 99 mmol/L (ref 98–110)
Creat: 0.93 mg/dL (ref 0.50–0.99)
GFR, Est African American: 73 mL/min/{1.73_m2} (ref >=60)
GFR, Est Non African American: 63 mL/min/{1.73_m2} (ref >=60)
Globulin: 2.4 g/dL (calc) (ref 1.9–3.7)
Glucose, Bld: 96 mg/dL (ref 65–99)
Potassium: 4.2 mmol/L (ref 3.5–5.3)
Sodium: 140 mmol/L (ref 135–146)
Total Bilirubin: 0.5 mg/dL (ref 0.2–1.2)
Total Protein: 6.9 g/dL (ref 6.1–8.1)

## 2021-01-02 LAB — LIPID PANEL
Cholesterol: 234 mg/dL — ABNORMAL HIGH (ref <200)
HDL: 98 mg/dL (ref >=50)
LDL Cholesterol (Calc): 122 mg/dL (calc) — ABNORMAL HIGH
Non-HDL Cholesterol (Calc): 136 mg/dL (calc) — ABNORMAL HIGH (ref <130)
Total CHOL/HDL Ratio: 2.4 (calc) (ref <5.0)
Triglycerides: 52 mg/dL (ref <150)

## 2021-01-02 LAB — MAGNESIUM: Magnesium: 2 mg/dL (ref 1.5–2.5)

## 2021-01-02 LAB — IRON,TIBC AND FERRITIN PANEL
%SAT: 14 % (calc) — ABNORMAL LOW (ref 16–45)
Ferritin: 66 ng/mL (ref 16–288)
Iron: 51 ug/dL (ref 45–160)
TIBC: 367 mcg/dL (calc) (ref 250–450)

## 2021-01-02 LAB — HEMOGLOBIN A1C
Hgb A1c MFr Bld: 6.4 % of total Hgb — ABNORMAL HIGH (ref <5.7)
Mean Plasma Glucose: 137 mg/dL
eAG (mmol/L): 7.6 mmol/L

## 2021-01-02 LAB — MICROALBUMIN / CREATININE URINE RATIO
Creatinine, Urine: 66 mg/dL (ref 20–275)
Microalb Creat Ratio: 3 mcg/mg creat (ref <30)
Microalb, Ur: 0.2 mg/dL

## 2021-01-02 LAB — TSH: TSH: 1.54 mIU/L (ref 0.40–4.50)

## 2021-01-07 DIAGNOSIS — J3089 Other allergic rhinitis: Secondary | ICD-10-CM | POA: Diagnosis not present

## 2021-01-07 DIAGNOSIS — J301 Allergic rhinitis due to pollen: Secondary | ICD-10-CM | POA: Diagnosis not present

## 2021-01-14 DIAGNOSIS — J301 Allergic rhinitis due to pollen: Secondary | ICD-10-CM | POA: Diagnosis not present

## 2021-01-14 DIAGNOSIS — J3089 Other allergic rhinitis: Secondary | ICD-10-CM | POA: Diagnosis not present

## 2021-01-18 DIAGNOSIS — J3089 Other allergic rhinitis: Secondary | ICD-10-CM | POA: Diagnosis not present

## 2021-01-18 DIAGNOSIS — J301 Allergic rhinitis due to pollen: Secondary | ICD-10-CM | POA: Diagnosis not present

## 2021-01-20 DIAGNOSIS — J3089 Other allergic rhinitis: Secondary | ICD-10-CM | POA: Diagnosis not present

## 2021-01-20 DIAGNOSIS — J301 Allergic rhinitis due to pollen: Secondary | ICD-10-CM | POA: Diagnosis not present

## 2021-01-27 DIAGNOSIS — J301 Allergic rhinitis due to pollen: Secondary | ICD-10-CM | POA: Diagnosis not present

## 2021-01-27 DIAGNOSIS — J3089 Other allergic rhinitis: Secondary | ICD-10-CM | POA: Diagnosis not present

## 2021-02-03 DIAGNOSIS — J3089 Other allergic rhinitis: Secondary | ICD-10-CM | POA: Diagnosis not present

## 2021-02-03 DIAGNOSIS — J301 Allergic rhinitis due to pollen: Secondary | ICD-10-CM | POA: Diagnosis not present

## 2021-02-06 ENCOUNTER — Other Ambulatory Visit: Payer: Self-pay | Admitting: Adult Health

## 2021-02-11 DIAGNOSIS — J301 Allergic rhinitis due to pollen: Secondary | ICD-10-CM | POA: Diagnosis not present

## 2021-02-11 DIAGNOSIS — J3089 Other allergic rhinitis: Secondary | ICD-10-CM | POA: Diagnosis not present

## 2021-02-18 DIAGNOSIS — J3089 Other allergic rhinitis: Secondary | ICD-10-CM | POA: Diagnosis not present

## 2021-02-18 DIAGNOSIS — J301 Allergic rhinitis due to pollen: Secondary | ICD-10-CM | POA: Diagnosis not present

## 2021-02-25 DIAGNOSIS — J301 Allergic rhinitis due to pollen: Secondary | ICD-10-CM | POA: Diagnosis not present

## 2021-02-25 DIAGNOSIS — J3089 Other allergic rhinitis: Secondary | ICD-10-CM | POA: Diagnosis not present

## 2021-03-04 DIAGNOSIS — J3089 Other allergic rhinitis: Secondary | ICD-10-CM | POA: Diagnosis not present

## 2021-03-04 DIAGNOSIS — J301 Allergic rhinitis due to pollen: Secondary | ICD-10-CM | POA: Diagnosis not present

## 2021-03-10 ENCOUNTER — Other Ambulatory Visit: Payer: Self-pay | Admitting: Internal Medicine

## 2021-03-10 DIAGNOSIS — E1122 Type 2 diabetes mellitus with diabetic chronic kidney disease: Secondary | ICD-10-CM

## 2021-03-12 DIAGNOSIS — J301 Allergic rhinitis due to pollen: Secondary | ICD-10-CM | POA: Diagnosis not present

## 2021-03-12 DIAGNOSIS — J3089 Other allergic rhinitis: Secondary | ICD-10-CM | POA: Diagnosis not present

## 2021-03-18 ENCOUNTER — Telehealth: Payer: Self-pay | Admitting: Physical Medicine and Rehabilitation

## 2021-03-18 NOTE — Telephone Encounter (Signed)
Patient called needing to schedule an appointment with Dr Ernestina Patches for her lower back. The number to contact patient is [phone]

## 2021-03-19 DIAGNOSIS — J3089 Other allergic rhinitis: Secondary | ICD-10-CM | POA: Diagnosis not present

## 2021-03-19 DIAGNOSIS — J301 Allergic rhinitis due to pollen: Secondary | ICD-10-CM | POA: Diagnosis not present

## 2021-03-19 NOTE — Telephone Encounter (Signed)
Called pt and sch OV-- lower back pain

## 2021-03-25 DIAGNOSIS — J3089 Other allergic rhinitis: Secondary | ICD-10-CM | POA: Diagnosis not present

## 2021-03-25 DIAGNOSIS — J301 Allergic rhinitis due to pollen: Secondary | ICD-10-CM | POA: Diagnosis not present

## 2021-04-02 ENCOUNTER — Encounter: Payer: Self-pay | Admitting: Physical Medicine and Rehabilitation

## 2021-04-02 ENCOUNTER — Other Ambulatory Visit: Payer: Self-pay

## 2021-04-02 ENCOUNTER — Ambulatory Visit (INDEPENDENT_AMBULATORY_CARE_PROVIDER_SITE_OTHER): Payer: Medicare HMO | Admitting: Physical Medicine and Rehabilitation

## 2021-04-02 VITALS — BP 147/90 | HR 105

## 2021-04-02 DIAGNOSIS — M47816 Spondylosis without myelopathy or radiculopathy, lumbar region: Secondary | ICD-10-CM | POA: Diagnosis not present

## 2021-04-02 DIAGNOSIS — M5416 Radiculopathy, lumbar region: Secondary | ICD-10-CM

## 2021-04-02 DIAGNOSIS — M4316 Spondylolisthesis, lumbar region: Secondary | ICD-10-CM | POA: Diagnosis not present

## 2021-04-02 DIAGNOSIS — G894 Chronic pain syndrome: Secondary | ICD-10-CM

## 2021-04-02 DIAGNOSIS — M48061 Spinal stenosis, lumbar region without neurogenic claudication: Secondary | ICD-10-CM | POA: Diagnosis not present

## 2021-04-02 NOTE — Progress Notes (Signed)
Future Appointments  Date Time Provider [place]  04/03/2021 10:30 AM Unk Pinto, MD [facility] None  04/17/2021  8:45 AM Magnus Sinning, MD [facility] None  07/07/2021 10:30 AM Liane Comber, NP [facility] None  01/01/2022  3:00 PM Liane Comber, NP [facility] None    History of Present Illness:       [name] presents for 3 month follow up with HTN, HLD, Pre-Diabetes and Vitamin D Deficiency.  Patient has GERD controlled with Diet & her Omeprazole.       Patient is treated for HTN since 2013  & BP has been controlled at home. Today's BP is at goal - 124/82. Patient has had no complaints of any cardiac type chest pain, palpitations, dyspnea / orthopnea / PND, dizziness, claudication, or dependent edema.       Hyperlipidemia is not controlled with diet & meds. Patient denies myalgias or other med SE's. Last Lipids were not at goal:  Lab Results  Component Value Date   CHOL 234 (H) 01/01/2021   HDL 98 01/01/2021   LDLCALC 122 (H) 01/01/2021   TRIG 52 01/01/2021   CHOLHDL 2.4 01/01/2021     Also, the patient has Morbid obesity (BMI 35+) and history of T2_NIDDM w/CKD2  (July 2019 )was started on Metformin.  and has had no symptoms of reactive hypoglycemia, diabetic polys, paresthesias or visual blurring.  Last A1c was not at goal:  Lab Results  Component Value Date   HGBA1C 6.4 (H) 01/01/2021        Further, the patient also has history of Vitamin D Deficiency  ("24" /2019)  and supplements vitamin D without any suspected side-effects. Last vitamin D was at goal: Lab Results  Component Value Date   VD25OH 59 01/01/2021     Current Outpatient Medications on File Prior to Visit  Medication Sig  . VITAMIN D 10,000 Units  TABS Take daily.  . cyanocobalamin 100 MCG tablet Take daily.  . cyclobenzaprine  10 MG tablet Take 1 tablet   at bedtime as needed for muscle spasms.  . fexofenadine  180 MG tablet Take 1 tablet daily.  Marland Kitchen ADVAIR HFA  115-21  inhaler Inhale 2 puffs  2  times daily.  . Hydrochlorothiazide  25 MG tablet TAKE 1 TABLET  EVERY DAY  . MAGNESIUM 500 mg  Take  daily.   . metFORMIN 500 MG tablet TAKE 1 TABLET 3 X /DAY WITH MEAL  . omeprazole40 MG capsule TAKE 1 CAPSULE DAILY   . Potassium 99 MG TABS Take  2 )times daily.  . Red Yeast Rice Extract2,000 mg  Take  daily.      Allergies  Allergen Reactions  . Metronidazole Other (See Comments)    unknown  . Pneumovax 23 [Pneumococcal Vac Polyvalent]     Redness and severe swelling at injection site  . Ppd [Tuberculin Purified Protein Derivative] Other (See Comments)    + PPD 2013 with NEG CXR 05/2012     PMHx:   Past Medical History:  Diagnosis Date  . Anemia   . Arthritis    "right hand, back" (04/03/2016)  . Asthma   . Bilateral carpal tunnel syndrome   . Chronic lower back pain   . GERD (gastroesophageal reflux disease)   . Hyperlipidemia   . Hypertension   . Multiple allergies   . Pneumonia 03/2012  . Positive PPD    "they told me it wasn't positive; I was allergic  to the test itself"  . Right rotator cuff tear 01/20/2013  . Type II diabetes mellitus ([place])   . Vasculitis ([place]) 04/03/2016  . Vitamin deficiency   . Wears dentures    upper  . Wears glasses      Immunization History  Administered Date(s) Administered  . Influenza, High Dose Seasonal PF 11/10/2017, 09/15/2018, 09/04/2019  . Influenza, Seasonal, Injecte, Preservative Fre 10/17/2015  . Influenza,inj,quad, With Preservative 10/15/2016  . Influenza-Unspecified 08/30/2020  . PFIZER(Purple Top)SARS-COV-2 Vaccination 12/22/2019, 01/12/2020  . Pneumococcal Conjugate-13 12/22/2018  . Pneumococcal Polysaccharide-23 02/04/2017  . Pneumococcal-Unspecified 06/27/2013  . Tdap 04/23/2012  . Zoster 07/31/2015     Past Surgical History:  Procedure Laterality Date  . ABDOMINAL HYSTERECTOMY    . BILATERAL SALPINGOOPHORECTOMY  11/28/2010   open laparoscopy with adhesiolysis also  .  CARPAL TUNNEL RELEASE  07/15/2012   Procedure: CARPAL TUNNEL RELEASE;  Surgeon: Cammie Sickle., MD;  Location: [facility];  Service: Orthopedics;  Laterality: Left;  . CARPAL TUNNEL RELEASE Right 05/31/2014   Procedure: RIGHT CARPAL TUNNEL RELEASE;  Surgeon: Cammie Sickle, MD;  Location: [facility];  Service: Orthopedics;  Laterality: Right;  . COLONOSCOPY    . KNEE ARTHROSCOPY Bilateral 2005-2016   "right-left"  . SHOULDER ARTHROSCOPY WITH ROTATOR CUFF REPAIR AND SUBACROMIAL DECOMPRESSION Right 01/20/2013   Procedure: RIGHT ARTHROSCOPY SHOULDER DEBRIDEMENT LIMITED, ARTHROSCOPY SHOULDER DECOMPRESSION SUBACROMIAL PARTIAL ACROMIOPLASTY WITH CORACOACROMIAL RELEASE, ROTATOR CUFF REPAIR ;  Surgeon: Johnny Bridge, MD;  Location: [facility];  Service: Orthopedics;  Laterality: Right;  RIGHT SHOULDER SCOPE DEBRIDEMENT, ACRIOMIOPLASTY, ROTATOR CUFF REPAIR  . TUBAL LIGATION      FHx:    Reviewed / unchanged  SHx:    Reviewed / unchanged   Systems Review:  Constitutional: Denies fever, chills, wt changes, headaches, insomnia, fatigue, night sweats, change in appetite. Eyes: Denies redness, blurred vision, diplopia, discharge, itchy, watery eyes.  ENT: Denies discharge, congestion, post nasal drip, epistaxis, sore throat, earache, hearing loss, dental pain, tinnitus, vertigo, sinus pain, snoring.  CV: Denies chest pain, palpitations, irregular heartbeat, syncope, dyspnea, diaphoresis, orthopnea, PND, claudication or edema. Respiratory: denies cough, dyspnea, DOE, pleurisy, hoarseness, laryngitis, wheezing.  Gastrointestinal: Denies dysphagia, odynophagia, heartburn, reflux, water brash, abdominal pain or cramps, nausea, vomiting, bloating, diarrhea, constipation, hematemesis, melena, hematochezia  or hemorrhoids. Genitourinary: Denies dysuria, frequency, urgency, nocturia, hesitancy, discharge, hematuria or flank pain. Musculoskeletal: Denies  arthralgias, myalgias, stiffness, jt. swelling, pain, limping or strain/sprain.  Skin: Denies pruritus, rash, hives, warts, acne, eczema or change in skin lesion(s). Neuro: No weakness, tremor, incoordination, spasms, paresthesia or pain. Psychiatric: Denies confusion, memory loss or sensory loss. Endo: Denies change in weight, skin or hair change.  Heme/Lymph: No excessive bleeding, bruising or enlarged lymph nodes.  Physical Exam  BP 124/82   Pulse (!) 130   Temp (!) 97.5 F (36.4 C)   Resp 16   Ht 5' 3.25" (1.607 m)   Wt 192 lb 6.4 oz (87.3 kg)   SpO2 95%   BMI 33.81 kg/m   Appears  well nourished, well groomed  and in no distress.  Eyes: PERRLA, EOMs, conjunctiva no swelling or erythema. Sinuses: No frontal/maxillary tenderness ENT/Mouth: EAC's clear, TM's nl w/o erythema, bulging. Nares clear w/o erythema, swelling, exudates. Oropharynx clear without erythema or exudates. Oral hygiene is good. Tongue normal, non obstructing. Hearing intact.  Neck: Supple. Thyroid not palpable. Car 2+/2+ without bruits, nodes or JVD. Chest: Respirations nl with BS clear &  equal w/o rales, rhonchi, wheezing or stridor.  Cor: Heart sounds normal w/ regular rate and rhythm without sig. murmurs, gallops, clicks or rubs. Peripheral pulses normal and equal  without edema.  Abdomen: Soft & bowel sounds normal. Non-tender w/o guarding, rebound, hernias, masses or organomegaly.  Lymphatics: Unremarkable.  Musculoskeletal: Full ROM all peripheral extremities, joint stability, 5/5 strength and normal gait.  Skin: Warm, dry without exposed rashes, lesions or ecchymosis apparent. All  #10 toenails thickened , chalky, dystrophic.  Neuro: Cranial nerves intact, reflexes equal bilaterally. Sensory-motor testing grossly intact. Tendon reflexes grossly intact.  Pysch: Alert & oriented x 3.  Insight and judgement nl & appropriate. No ideations.  Assessment and Plan:  1. Essential hypertension  - CBC with  Differential/Platelet - COMPLETE METABOLIC PANEL WITH GFR - Magnesium - TSH  2. Hyperlipidemia associated with type 2 diabetes mellitus ([place])  - Continue diet/meds, exercise,& lifestyle modifications.  - Continue monitor periodic cholesterol/liver & renal functions   - Lipid panel - TSH  3. Type 2 diabetes mellitus with stage 2 chronic kidney  disease, without long-term current use of insulin ([name])  - Continue medication, monitor blood pressure at home.  - Continue DASH diet.  Reminder to go to the ER if any CP,  SOB, nausea, dizziness, severe HA, changes vision/speech.  - Continue diet, exercise  - Lifestyle modifications.  - Monitor appropriate labs  - Hemoglobin A1c - Insulin, random  4. Vitamin D deficiency  - Continue supplementation.  - VITAMIN D 25 Hydroxy   5. Onychomycosis toenails x # 10  - Lamisil 250 mg #90  X 1 Rf   6. Medication management  - CBC with Differential/Platelet - COMPLETE METABOLIC PANEL WITH GFR - Magnesium - Lipid panel - TSH - Hemoglobin A1c - Insulin, random - VITAMIN D 25 Hydroxy)             Discussed  regular exercise, BP monitoring, weight control to achieve/maintain BMI less than 25 and discussed med and SE's. Recommended labs to assess and monitor clinical status with further disposition pending results of labs.  I discussed the assessment and treatment plan with the patient. The patient was provided an opportunity to ask questions and all were answered. The patient agreed with the plan and demonstrated an understanding of the instructions.  I provided over 30 minutes of exam, counseling, chart review and  complex critical decision making.         The patient was advised to call back or seek an in-person evaluation if the symptoms worsen or if the condition fails to improve as anticipated.   Kirtland Bouchard, MD

## 2021-04-02 NOTE — Patient Instructions (Signed)

## 2021-04-02 NOTE — Progress Notes (Signed)
Jola Baptist, DC.Marland KitchenPt state Lower back that travels down both legs. Pt state walking, standing and sitting makes the pain worse. Pt state laying down at night and getting up she feels pain. Pt state it keeps her up at night. Pt state she take over the counter pain meds and uses ice to help ease her pain.  Numeric Pain Rating Scale and Functional Assessment Average Pain 10 Pain Right Now 5 My pain is intermittent, burning and aching Pain is worse with: walking, sitting and some activites Pain improves with: rest, heat/ice and medication   In the last MONTH (on 0-10 scale) has pain interfered with the following?  1. General activity like being  able to carry out your everyday physical activities such as walking, climbing stairs, carrying groceries, or moving a chair?  Rating(8)  2. Relation with others like being able to carry out your usual social activities and roles such as  activities at home, at work and in your community. Rating(8)  3. Enjoyment of life such that you have  been bothered by emotional problems such as feeling anxious, depressed or irritable?  Rating(9)

## 2021-04-03 ENCOUNTER — Encounter: Payer: Self-pay | Admitting: Physical Medicine and Rehabilitation

## 2021-04-03 ENCOUNTER — Encounter: Payer: Self-pay | Admitting: Internal Medicine

## 2021-04-03 ENCOUNTER — Ambulatory Visit (INDEPENDENT_AMBULATORY_CARE_PROVIDER_SITE_OTHER): Payer: Medicare HMO | Admitting: Internal Medicine

## 2021-04-03 VITALS — BP 124/82 | HR 130 | Temp 97.5°F | Resp 16 | Ht 63.25 in | Wt 192.4 lb

## 2021-04-03 DIAGNOSIS — E785 Hyperlipidemia, unspecified: Secondary | ICD-10-CM

## 2021-04-03 DIAGNOSIS — E1169 Type 2 diabetes mellitus with other specified complication: Secondary | ICD-10-CM | POA: Diagnosis not present

## 2021-04-03 DIAGNOSIS — Z79899 Other long term (current) drug therapy: Secondary | ICD-10-CM | POA: Diagnosis not present

## 2021-04-03 DIAGNOSIS — E559 Vitamin D deficiency, unspecified: Secondary | ICD-10-CM | POA: Diagnosis not present

## 2021-04-03 DIAGNOSIS — N182 Chronic kidney disease, stage 2 (mild): Secondary | ICD-10-CM | POA: Diagnosis not present

## 2021-04-03 DIAGNOSIS — I10 Essential (primary) hypertension: Secondary | ICD-10-CM

## 2021-04-03 DIAGNOSIS — E1122 Type 2 diabetes mellitus with diabetic chronic kidney disease: Secondary | ICD-10-CM | POA: Diagnosis not present

## 2021-04-03 DIAGNOSIS — B351 Tinea unguium: Secondary | ICD-10-CM

## 2021-04-03 DIAGNOSIS — J301 Allergic rhinitis due to pollen: Secondary | ICD-10-CM | POA: Insufficient documentation

## 2021-04-03 MED ORDER — TERBINAFINE HCL 250 MG PO TABS: ORAL_TABLET | ORAL | 1 refills | Status: DC

## 2021-04-03 NOTE — Progress Notes (Signed)
Lauren Lopez - 70 y.o. female MRN [id number]  Date of birth: [birth year]  Office Visit Note: Visit Date: 04/02/2021 PCP: Unk Pinto, MD Referred by: Unk Pinto, MD  Subjective: Chief Complaint  Patient presents with  . Lower Back - Pain  . Left Leg - Pain  . Right Leg - Pain   HPI: Lauren Lopez is a 70 y.o. female who comes in today For evaluation and management of 10 out of 10 intermittent burning and aching low back pain that is chronic in nature and worsened over the last several months.  Location is across the lower back down into the thighs to about the knees posterior laterally.  No paresthesias.  Worse with walking and standing somewhat worse with sitting but definitely at night getting up to go to the bathroom and standing will really bother her.  She is worse in the morning.  She can walk for exercise without difficulty but she has initial pain getting started but then she does well.  No sign of claudication type symptoms.  Her history is that Dr. Meridee Score in our office followed her for some years from an orthopedic standpoint and referred her to me for hip injection.  After that hip injection we also started seeing her for her back.  MRI from 2019 shows mainly facet joint arthritis at L3-4 with listhesis but no high-grade stenosis of the canal but some stenosis of the lateral recesses.  Similar finding in L4-5 just not as severe.  She does have a transitional segment with a superiorly sacralized L5.  On x-ray imaging the L4-5 vertebral body will essentially look like a normal L5 vertebral body.  Nonetheless the patient did well with facet joint blocks in 2019 and then ultimately in October of that year we completed a right sided radiofrequency ablation of the L3-4 and L4-5 facet joints.  Her central complaint back then was more right low back and right hip pain.  She reports that she cannot remember how much it helped at the time.  She began seeing Dr. Jola Baptist for chiropractic care the next year so I do think it probably did help for some time.  She had ongoing other medical issues.  Her case is complicated by morbid obesity and chronic pain syndrome.  Her case is further complicated by diabetes with chronic kidney disease.  From a pain standpoint she uses Flexeril and Tylenol.  She says the Flexeril does help her sleep a little bit at night.  She has had extensive chiropractic care by Dr. Jola Baptist from 2021 up until just recently with multiple treatments.  In our medical record we can actually see when she has had those but I cannot review the full notes.  She reports having had decompression work as well as TENS unit work and other mechanical type treatments.  She reports that she has spent several thousand dollars and it would initially help and then the symptoms would come right back.  She reports to me that she just could not afford to keep going to see him.  She has not noted any new specific injury or focal weakness no bowel or bladder changes no real red flag complaints.  Review of Systems  Musculoskeletal: Positive for back pain and joint pain.  All other systems reviewed and are negative.  Otherwise per HPI.  Assessment & Plan: Visit Diagnoses:    ICD-10-CM   1. Spondylosis without myelopathy or radiculopathy, lumbar region  M47.816  2. Spondylolisthesis of lumbar region  M43.16   3. Lumbar radiculopathy  M54.16   4. Bilateral stenosis of lateral recess of lumbar spine  M48.061   5. Chronic pain syndrome  G89.4      Plan: Findings:  Chronic long-term history of back and hip pain with some problems with hip arthritis and lumbar spine arthritis in general.  Completed radiofrequency ablation of the right L3-4 and L4-5 facet joints in 2019 with questionable but I think relief given the fact that she did not seek any other care until about 6 months or more later with Dr. Jola Baptist and chiropractic practice.  Nonetheless now she  is having worsening severe 10 out of 10 back pain with some referral in the hips and legs.  Seems to be more radicular and more stenosis related although she does not have symptoms of claudication and can walk for exercise which she does.  She stays very active with home exercise program and she has had multiple rounds of chiropractic care and conservative care in that nature.  MRI findings consistent with lateral recess narrowing and severe facet arthropathy particular at L3-4 lesser at L4-5.  Again transitional segment with sacralized L5 segment.  I think at this point given the failure of conservative care and the amount of pain that she is having we will request approval for a diagnostic fluoroscopically guided L4-5 interlaminar epidural steroid injection.  Injections are performed as part of a comprehensive pain management approach through the orthopedic office with access to physical therapy and behavioral health for cognitive treatment of pain management.    Meds & Orders: No orders of the defined types were placed in this encounter.  No orders of the defined types were placed in this encounter.   Follow-up: No follow-ups on file.   Procedures: No procedures performed      Clinical History: MRI LUMBAR SPINE WITHOUT CONTRAST  TECHNIQUE: Multiplanar, multisequence MR imaging of the lumbar spine was performed. No intravenous contrast was administered.  COMPARISON:  Lumbar radiographs 02/24/2018 and earlier. CT Abdomen and Pelvis 10/20/2010. chest CTA 04/03/2016.  FINDINGS: Segmentation: 12 pairs of ribs including the lowest which are hypoplastic demonstrated on the 2017 CTA. Subsequently there are 4 lumbar type vertebral bodies. Therefore, the L5 level is fully sacralized with a vestigial L5-S1 disc space. Correlation with radiographs is recommended prior to any operative intervention.  Alignment: Chronic grade 1 anterolisthesis of L3 on L4 measuring 3-4 millimeters appears  increased since 2011. Stable vertebral height and alignment elsewhere with mild straightening of lumbar lordosis.  Vertebrae: No marrow edema or evidence of acute osseous abnormality. Visualized bone marrow signal is within normal limits. Intact visible sacrum and SI joints.  Conus medullaris and cauda equina: Conus extends to the L1 level. No lower spinal cord or conus signal abnormality.  Paraspinal and other soft tissues: Stable since 2011 and negative visible abdominal viscera. Negative visualized posterior paraspinal soft tissues.  Disc levels:  T10-T11: Negative.  T11-T12: Negative.  T12-L1: Mild disc bulging with cephalad extruded disc fragment occupying the left ventral epidural space posterior to the T12 vertebral body (series 8, image 8, series 11, image 4. The extruded disc fragment encompasses 4 x 10 x 10 millimeters (AP by transverse by CC). This is in proximity to the descending left T12 nerve roots in the lateral recess. No significant spinal or foraminal stenosis.  L1-L2: Minimal far lateral disc bulging and facet hypertrophy. No stenosis.  L2-L3: Mild far lateral disc bulging and endplate  spurring. Moderate facet hypertrophy. No significant stenosis.  L3-L4: Grade 1 anterolisthesis. Disc desiccation and mild disc space loss with circumferential but mostly foraminal and far lateral disc bulging. Moderate to severe facet and ligament flavum hypertrophy. Mild spinal stenosis with mild bilateral lateral recess stenosis (L4 nerve levels), and mild to moderate bilateral L3 neural foraminal stenosis.  L4-L5: Vacuum disc. Circumferential disc bulge with broad-based foraminal involvement. Moderate facet and ligament flavum hypertrophy. No spinal or lateral recess stenosis. Mild to moderate bilateral L4 foraminal stenosis appears greater on the right.  L5-S1:  Sacralized and negative.  IMPRESSION: 1. 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 Correlation with radiographs is recommended prior to any operative intervention. 2. Grade 1 spondylolisthesis at L3-L4 is chronic but progressed since 2011. There is multifactorial mild spinal stenosis with mild lateral recess and up to moderate neural foraminal stenosis. Query right L3 and/or L4 radiculitis. 3. Up to moderate multifactorial neural foraminal stenosis also at L4-L5; the exiting L4 nerve levels. 4. There is a small left-side extruded disc fragment posterior to the T12 vertebral body. No significant spinal stenosis, this could be a source for left T12 radiculitis. 5. Mild for age lumbar spine degeneration elsewhere.   Electronically Signed   By: Genevie Ann M.D.   On: 06/21/2018 09:11   She reports that she has never smoked. She has never used smokeless tobacco.  Recent Labs    05/01/20 1553 08/01/20 1140 01/01/21 1627  HGBA1C 6.0* 6.4* 6.4*    Objective:  VS:  HT:    WT:   BMI:     BP:(!) 147/90  HR:(!) 105bpm  TEMP: ( )  RESP:  Physical Exam Vitals and nursing note reviewed.  Constitutional:      General: She is not in acute distress.    Appearance: Normal appearance. She is obese. She is not ill-appearing.  HENT:     Head: Normocephalic and atraumatic.     Right Ear: External ear normal.     Left Ear: External ear normal.  Eyes:     Extraocular Movements: Extraocular movements intact.  Cardiovascular:     Rate and Rhythm: Normal rate.     Pulses: Normal pulses.  Pulmonary:     Effort: Pulmonary effort is normal. No respiratory distress.  Abdominal:     General: There is no distension.     Palpations: Abdomen is soft.  Musculoskeletal:        General: Tenderness present.     Cervical back: Neck supple.     Right lower leg: No edema.     Left lower leg: No edema.     Comments: Patient has good distal strength with normal plantarflexion, dorsiflexion and EHL.  She has some mild dysesthesias in a stocking distribution of the  lower limbs.  She has mild swelling.  She has some difficulty going from sit to stand to full extension.  She has concordant low back pain with facet loading and extension.  She has mild pain over the bilateral greater trochanters but it does not reproduce her normal pain.  No pain over the PSIS.  She has an equivocally positive slump test bilaterally.  Skin:    Findings: No erythema, lesion or rash.  Neurological:     General: No focal deficit present.     Mental Status: She is alert and oriented to person, place, and time.     Sensory: No sensory deficit.     Motor: No weakness or abnormal muscle tone.  Coordination: Coordination normal.  Psychiatric:        Mood and Affect: Mood normal.        Behavior: Behavior normal.     Ortho Exam  Imaging: No results found.  Past Medical/Family/Surgical/Social History: Medications & Allergies reviewed per EMR, new medications updated. Patient Active Problem List   Diagnosis Date Noted  . Allergic rhinitis due to pollen 04/03/2021  . Lipoma of right upper extremity 05/01/2020  . Hyperlipidemia associated with type 2 diabetes mellitus ([place]) 04/30/2020  . CKD stage 2 due to type 2 diabetes mellitus ([place]) 04/30/2020  . Type 2 diabetes mellitus with stage 2 chronic kidney disease, without long-term current use of insulin ([place]) 04/30/2020  . Obesity (BMI 30.0-34.9) 05/30/2018  . Osteoporosis 06/01/2017  . Back pain 04/05/2016  . Medication management 01/29/2015  . GERD (gastroesophageal reflux disease)   . Vitamin D deficiency   . Asthma   . Essential hypertension 08/09/2009   Past Medical History:  Diagnosis Date  . Anemia   . Arthritis    "right hand, back" (04/03/2016)  . Asthma   . Bilateral carpal tunnel syndrome   . Chronic lower back pain   . GERD (gastroesophageal reflux disease)   . Hyperlipidemia   . Hypertension   . Multiple allergies   . Pneumonia 03/2012  . Positive PPD    "they told me it wasn't positive; I was  allergic to the test itself"  . Right rotator cuff tear 01/20/2013  . Type II diabetes mellitus ([place])   . Vasculitis ([place]) 04/03/2016  . Vitamin deficiency   . Wears dentures    upper  . Wears glasses    Family History  Problem Relation Age of Onset  . Hypertension Mother   . Diabetes Mother   . Dementia Mother   . Hypertension Father   . Alzheimer's disease Father   . Tongue cancer Sister        smoker  . Cirrhosis Brother   . Hypertension Brother    Past Surgical History:  Procedure Laterality Date  . ABDOMINAL HYSTERECTOMY    . BILATERAL SALPINGOOPHORECTOMY  11/28/2010   open laparoscopy with adhesiolysis also  . CARPAL TUNNEL RELEASE  07/15/2012   Procedure: CARPAL TUNNEL RELEASE;  Surgeon: Cammie Sickle., MD;  Location: [facility];  Service: Orthopedics;  Laterality: Left;  . CARPAL TUNNEL RELEASE Right 05/31/2014   Procedure: RIGHT CARPAL TUNNEL RELEASE;  Surgeon: Cammie Sickle, MD;  Location: [facility];  Service: Orthopedics;  Laterality: Right;  . COLONOSCOPY    . KNEE ARTHROSCOPY Bilateral 2005-2016   "right-left"  . SHOULDER ARTHROSCOPY WITH ROTATOR CUFF REPAIR AND SUBACROMIAL DECOMPRESSION Right 01/20/2013   Procedure: RIGHT ARTHROSCOPY SHOULDER DEBRIDEMENT LIMITED, ARTHROSCOPY SHOULDER DECOMPRESSION SUBACROMIAL PARTIAL ACROMIOPLASTY WITH CORACOACROMIAL RELEASE, ROTATOR CUFF REPAIR ;  Surgeon: Johnny Bridge, MD;  Location: [facility];  Service: Orthopedics;  Laterality: Right;  RIGHT SHOULDER SCOPE DEBRIDEMENT, ACRIOMIOPLASTY, ROTATOR CUFF REPAIR  . TUBAL LIGATION     Social History   Occupational History  . Not on file  Tobacco Use  . Smoking status: Never Smoker  . Smokeless tobacco: Never Used  Vaping Use  . Vaping Use: Never used  Substance and Sexual Activity  . Alcohol use: No  . Drug use: No  . Sexual activity: Not Currently    Birth control/protection: Post-menopausal

## 2021-04-04 LAB — COMPLETE METABOLIC PANEL WITH GFR
AG Ratio: 1.4 (calc) (ref 1.0–2.5)
ALT: 16 U/L (ref 6–29)
AST: 20 U/L (ref 10–35)
Albumin: 4.2 g/dL (ref 3.6–5.1)
Alkaline phosphatase (APISO): 110 U/L (ref 37–153)
BUN: 12 mg/dL (ref 7–25)
CO2: 23 mmol/L (ref 20–32)
Calcium: 9.7 mg/dL (ref 8.6–10.4)
Chloride: 103 mmol/L (ref 98–110)
Creat: 0.86 mg/dL (ref 0.50–0.99)
GFR, Est African American: 80 mL/min/{1.73_m2} (ref >=60)
GFR, Est Non African American: 69 mL/min/{1.73_m2} (ref >=60)
Globulin: 2.9 g/dL (calc) (ref 1.9–3.7)
Glucose, Bld: 125 mg/dL — ABNORMAL HIGH (ref 65–99)
Potassium: 3.8 mmol/L (ref 3.5–5.3)
Sodium: 138 mmol/L (ref 135–146)
Total Bilirubin: 0.6 mg/dL (ref 0.2–1.2)
Total Protein: 7.1 g/dL (ref 6.1–8.1)

## 2021-04-04 LAB — CBC WITH DIFFERENTIAL/PLATELET
Absolute Monocytes: 429 cells/uL (ref 200–950)
Basophils Absolute: 21 cells/uL (ref 0–200)
Basophils Relative: 0.4 %
Eosinophils Absolute: 69 cells/uL (ref 15–500)
Eosinophils Relative: 1.3 %
HCT: 38.9 % (ref 35.0–45.0)
Hemoglobin: 12.2 g/dL (ref 11.7–15.5)
Lymphs Abs: 2136 cells/uL (ref 850–3900)
MCH: 27 pg (ref 27.0–33.0)
MCHC: 31.4 g/dL — ABNORMAL LOW (ref 32.0–36.0)
MCV: 86.1 fL (ref 80.0–100.0)
MPV: 11.8 fL (ref 7.5–12.5)
Monocytes Relative: 8.1 %
Neutro Abs: 2645 cells/uL (ref 1500–7800)
Neutrophils Relative %: 49.9 %
Platelets: 220 10*3/uL (ref 140–400)
RBC: 4.52 10*6/uL (ref 3.80–5.10)
RDW: 13.6 % (ref 11.0–15.0)
Total Lymphocyte: 40.3 %
WBC: 5.3 10*3/uL (ref 3.8–10.8)

## 2021-04-04 LAB — MAGNESIUM: Magnesium: 1.7 mg/dL (ref 1.5–2.5)

## 2021-04-04 LAB — LIPID PANEL
Cholesterol: 221 mg/dL — ABNORMAL HIGH (ref <200)
HDL: 84 mg/dL (ref >=50)
LDL Cholesterol (Calc): 118 mg/dL (calc) — ABNORMAL HIGH
Non-HDL Cholesterol (Calc): 137 mg/dL (calc) — ABNORMAL HIGH (ref <130)
Total CHOL/HDL Ratio: 2.6 (calc) (ref <5.0)
Triglycerides: 86 mg/dL (ref <150)

## 2021-04-04 LAB — INSULIN, RANDOM: Insulin: 26.7 u[IU]/mL — ABNORMAL HIGH

## 2021-04-04 LAB — HEMOGLOBIN A1C
Hgb A1c MFr Bld: 6.3 % of total Hgb — ABNORMAL HIGH (ref <5.7)
Mean Plasma Glucose: 134 mg/dL
eAG (mmol/L): 7.4 mmol/L

## 2021-04-04 LAB — VITAMIN D 25 HYDROXY (VIT D DEFICIENCY, FRACTURES): Vit D, 25-Hydroxy: 72 ng/mL (ref 30–100)

## 2021-04-04 LAB — TSH: TSH: 1.18 mIU/L (ref 0.40–4.50)

## 2021-04-06 ENCOUNTER — Other Ambulatory Visit: Payer: Self-pay | Admitting: Internal Medicine

## 2021-04-06 DIAGNOSIS — E1169 Type 2 diabetes mellitus with other specified complication: Secondary | ICD-10-CM

## 2021-04-06 DIAGNOSIS — E785 Hyperlipidemia, unspecified: Secondary | ICD-10-CM

## 2021-04-06 MED ORDER — ROSUVASTATIN CALCIUM 10 MG PO TABS: ORAL_TABLET | ORAL | 3 refills | Status: DC

## 2021-04-08 DIAGNOSIS — J3089 Other allergic rhinitis: Secondary | ICD-10-CM | POA: Diagnosis not present

## 2021-04-08 DIAGNOSIS — J301 Allergic rhinitis due to pollen: Secondary | ICD-10-CM | POA: Diagnosis not present

## 2021-04-17 ENCOUNTER — Ambulatory Visit (INDEPENDENT_AMBULATORY_CARE_PROVIDER_SITE_OTHER): Payer: Medicare HMO | Admitting: Physical Medicine and Rehabilitation

## 2021-04-17 ENCOUNTER — Other Ambulatory Visit: Payer: Self-pay

## 2021-04-17 ENCOUNTER — Ambulatory Visit: Payer: Self-pay

## 2021-04-17 ENCOUNTER — Encounter: Payer: Self-pay | Admitting: Physical Medicine and Rehabilitation

## 2021-04-17 VITALS — BP 129/83 | HR 118

## 2021-04-17 DIAGNOSIS — M5416 Radiculopathy, lumbar region: Secondary | ICD-10-CM | POA: Diagnosis not present

## 2021-04-17 MED ORDER — BETAMETHASONE SOD PHOS & ACET 6 (3-3) MG/ML IJ SUSP
12.0000 mg | Freq: Once | INTRAMUSCULAR | Status: AC
Start: 2021-04-17 — End: 2021-04-17
  Administered 2021-04-17: 12 mg

## 2021-04-17 NOTE — Progress Notes (Signed)
Pt state lower back pain that travels down both legs. Pt state when getting up out of bed in the morning she feels so much pain. Pt state she take pain meds and uses ice pack to help ease her pain.  Numeric Pain Rating Scale and Functional Assessment Average Pain 4   In the last MONTH (on 0-10 scale) has pain interfered with the following?  1. General activity like being  able to carry out your everyday physical activities such as walking, climbing stairs, carrying groceries, or moving a chair?  Rating(7)   +Driver, -BT, -Dye Allergies.

## 2021-04-17 NOTE — Patient Instructions (Signed)

## 2021-04-17 NOTE — Procedures (Signed)
Lumbar Epidural Steroid Injection - Interlaminar Approach with Fluoroscopic Guidance  Patient: Lauren Lopez      Date of Birth: 10-May-1951 MRN: [id number] PCP: Unk Pinto, MD      Visit Date: 04/17/2021   Universal Protocol:     Consent Given By: the patient  Position: PRONE  Additional Comments: Vital signs were monitored before and after the procedure. Patient was prepped and draped in the usual sterile fashion. The correct patient, procedure, and site was verified.   Injection Procedure Details:   Procedure diagnoses: Lumbar radiculopathy [M54.16]   Meds Administered:  Meds ordered this encounter  Medications  . betamethasone acetate-betamethasone sodium phosphate (CELESTONE) injection 12 mg     Laterality: Right  Location/Site:  L4-L5  Needle: 3.5 in., 20 ga. Tuohy  Needle Placement: Paramedian epidural  Findings:   -Comments: Excellent flow of contrast into the epidural space.  Procedure Details: Using a paramedian approach from the side mentioned above, the region overlying the inferior lamina was localized under fluoroscopic visualization and the soft tissues overlying this structure were infiltrated with 4 ml. of 1% Lidocaine without Epinephrine. The Tuohy needle was inserted into the epidural space using a paramedian approach.   The epidural space was localized using loss of resistance along with counter oblique bi-planar fluoroscopic views.  After negative aspirate for air, blood, and CSF, a 2 ml. volume of Isovue-250 was injected into the epidural space and the flow of contrast was observed. Radiographs were obtained for documentation purposes.    The injectate was administered into the level noted above.   Additional Comments:  The patient tolerated the procedure well Dressing: 2 x 2 sterile gauze and Band-Aid    Post-procedure details: Patient was observed during the procedure. Post-procedure instructions were reviewed.  Patient left  the clinic in stable condition.

## 2021-04-17 NOTE — Progress Notes (Signed)
[name] - 70 y.o. female MRN [id number]  Date of birth: [birth year]  Office Visit Note: Visit Date: 04/17/2021 PCP: Unk Pinto, MD Referred by: Unk Pinto, MD  Subjective: Chief Complaint  Patient presents with  . Lower Back - Pain  . Left Leg - Pain  . Right Leg - Pain   HPI:  Lauren Lopez is a 70 y.o. female who comes in today for planned Right L4-L5 Lumbar Interlaminar epidural steroid injection with fluoroscopic guidance.  The patient has failed conservative care including home exercise, medications, time and activity modification.  This injection will be diagnostic and hopefully therapeutic.  Please see requesting physician notes for further details and justification.   ROS Otherwise per HPI.  Assessment & Plan: Visit Diagnoses:    ICD-10-CM   1. Lumbar radiculopathy  M54.16 XR C-ARM NO REPORT    Epidural Steroid injection    betamethasone acetate-betamethasone sodium phosphate (CELESTONE) injection 12 mg    Plan: No additional findings.   Meds & Orders:  Meds ordered this encounter  Medications  . betamethasone acetate-betamethasone sodium phosphate (CELESTONE) injection 12 mg    Orders Placed This Encounter  Procedures  . XR C-ARM NO REPORT  . Epidural Steroid injection    Follow-up: Return if symptoms worsen or fail to improve.   Procedures: No procedures performed  Lumbar Epidural Steroid Injection - Interlaminar Approach with Fluoroscopic Guidance  Patient: Lauren Lopez      Date of Birth: [birth year] MRN: [id number] PCP: Unk Pinto, MD      Visit Date: 04/17/2021   Universal Protocol:     Consent Given By: the patient  Position: PRONE  Additional Comments: Vital signs were monitored before and after the procedure. Patient was prepped and draped in the usual sterile fashion. The correct patient, procedure, and site was verified.   Injection Procedure Details:   Procedure diagnoses: Lumbar radiculopathy  [M54.16]   Meds Administered:  Meds ordered this encounter  Medications  . betamethasone acetate-betamethasone sodium phosphate (CELESTONE) injection 12 mg     Laterality: Right  Location/Site:  L4-L5  Needle: 3.5 in., 20 ga. Tuohy  Needle Placement: Paramedian epidural  Findings:   -Comments: Excellent flow of contrast into the epidural space.  Procedure Details: Using a paramedian approach from the side mentioned above, the region overlying the inferior lamina was localized under fluoroscopic visualization and the soft tissues overlying this structure were infiltrated with 4 ml. of 1% Lidocaine without Epinephrine. The Tuohy needle was inserted into the epidural space using a paramedian approach.   The epidural space was localized using loss of resistance along with counter oblique bi-planar fluoroscopic views.  After negative aspirate for air, blood, and CSF, a 2 ml. volume of Isovue-250 was injected into the epidural space and the flow of contrast was observed. Radiographs were obtained for documentation purposes.    The injectate was administered into the level noted above.   Additional Comments:  The patient tolerated the procedure well Dressing: 2 x 2 sterile gauze and Band-Aid    Post-procedure details: Patient was observed during the procedure. Post-procedure instructions were reviewed.  Patient left the clinic in stable condition.     Clinical History: MRI LUMBAR SPINE WITHOUT CONTRAST  TECHNIQUE: Multiplanar, multisequence MR imaging of the lumbar spine was performed. No intravenous contrast was administered.  COMPARISON:  Lumbar radiographs 02/24/2018 and earlier. CT Abdomen and Pelvis 10/20/2010. chest CTA 04/03/2016.  FINDINGS: Segmentation: 12 pairs of ribs including the lowest which are  hypoplastic demonstrated on the 2017 CTA. Subsequently there are 4 lumbar type vertebral bodies. Therefore, the L5 level is fully sacralized with a vestigial  L5-S1 disc space. Correlation with radiographs is recommended prior to Lauren operative intervention.  Alignment: Chronic grade 1 anterolisthesis of L3 on L4 measuring 3-4 millimeters appears increased since 2011. Stable vertebral height and alignment elsewhere with mild straightening of lumbar lordosis.  Vertebrae: No marrow edema or evidence of acute osseous abnormality. Visualized bone marrow signal is within normal limits. Intact visible sacrum and SI joints.  Conus medullaris and cauda equina: Conus extends to the L1 level. No lower spinal cord or conus signal abnormality.  Paraspinal and other soft tissues: Stable since 2011 and negative visible abdominal viscera. Negative visualized posterior paraspinal soft tissues.  Disc levels:  T10-T11: Negative.  T11-T12: Negative.  T12-L1: Mild disc bulging with cephalad extruded disc fragment occupying the left ventral epidural space posterior to the T12 vertebral body (series 8, image 8, series 11, image 4. The extruded disc fragment encompasses 4 x 10 x 10 millimeters (AP by transverse by CC). This is in proximity to the descending left T12 nerve roots in the lateral recess. No significant spinal or foraminal stenosis.  L1-L2: Minimal far lateral disc bulging and facet hypertrophy. No stenosis.  L2-L3: Mild far lateral disc bulging and endplate spurring. Moderate facet hypertrophy. No significant stenosis.  L3-L4: Grade 1 anterolisthesis. Disc desiccation and mild disc space loss with circumferential but mostly foraminal and far lateral disc bulging. Moderate to severe facet and ligament flavum hypertrophy. Mild spinal stenosis with mild bilateral lateral recess stenosis (L4 nerve levels), and mild to moderate bilateral L3 neural foraminal stenosis.  L4-L5: Vacuum disc. Circumferential disc bulge with broad-based foraminal involvement. Moderate facet and ligament flavum hypertrophy. No spinal or lateral recess  stenosis. Mild to moderate bilateral L4 foraminal stenosis appears greater on the right.  L5-S1:  Sacralized and negative.  IMPRESSION: 1. 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 Correlation with radiographs is recommended prior to Lauren operative intervention. 2. Grade 1 spondylolisthesis at L3-L4 is chronic but progressed since 2011. There is multifactorial mild spinal stenosis with mild lateral recess and up to moderate neural foraminal stenosis. Query right L3 and/or L4 radiculitis. 3. Up to moderate multifactorial neural foraminal stenosis also at L4-L5; the exiting L4 nerve levels. 4. There is a small left-side extruded disc fragment posterior to the T12 vertebral body. No significant spinal stenosis, this could be a source for left T12 radiculitis. 5. Mild for age lumbar spine degeneration elsewhere.   Electronically Signed   By: Genevie Ann M.D.   On: 06/21/2018 09:11     Objective:  VS:  HT:    WT:   BMI:     BP:129/83  HR:(!) 118bpm  TEMP: ( )  RESP:  Physical Exam Vitals and nursing note reviewed.  Constitutional:      General: She is not in acute distress.    Appearance: Normal appearance. She is not ill-appearing.  HENT:     Head: Normocephalic and atraumatic.     Right Ear: External ear normal.     Left Ear: External ear normal.  Eyes:     Extraocular Movements: Extraocular movements intact.  Cardiovascular:     Rate and Rhythm: Normal rate.     Pulses: Normal pulses.  Pulmonary:     Effort: Pulmonary effort is normal. No respiratory distress.  Abdominal:     General: There is no distension.  Palpations: Abdomen is soft.  Musculoskeletal:        General: Tenderness present.     Cervical back: Neck supple.     Right lower leg: No edema.     Left lower leg: No edema.     Comments: Patient has good distal strength with no pain over the greater trochanters.  No clonus or focal weakness.  Skin:    Findings: No erythema,  lesion or rash.  Neurological:     General: No focal deficit present.     Mental Status: She is alert and oriented to person, place, and time.     Sensory: No sensory deficit.     Motor: No weakness or abnormal muscle tone.     Coordination: Coordination normal.  Psychiatric:        Mood and Affect: Mood normal.        Behavior: Behavior normal.      Imaging: No results found.

## 2021-04-18 DIAGNOSIS — J3089 Other allergic rhinitis: Secondary | ICD-10-CM | POA: Diagnosis not present

## 2021-04-18 DIAGNOSIS — J301 Allergic rhinitis due to pollen: Secondary | ICD-10-CM | POA: Diagnosis not present

## 2021-04-25 DIAGNOSIS — J4530 Mild persistent asthma, uncomplicated: Secondary | ICD-10-CM | POA: Diagnosis not present

## 2021-04-25 DIAGNOSIS — J301 Allergic rhinitis due to pollen: Secondary | ICD-10-CM | POA: Diagnosis not present

## 2021-04-25 DIAGNOSIS — J3089 Other allergic rhinitis: Secondary | ICD-10-CM | POA: Diagnosis not present

## 2021-04-25 DIAGNOSIS — H1045 Other chronic allergic conjunctivitis: Secondary | ICD-10-CM | POA: Diagnosis not present

## 2021-05-01 ENCOUNTER — Other Ambulatory Visit: Payer: Self-pay | Admitting: Adult Health

## 2021-05-01 DIAGNOSIS — M545 Low back pain, unspecified: Secondary | ICD-10-CM

## 2021-05-02 DIAGNOSIS — J301 Allergic rhinitis due to pollen: Secondary | ICD-10-CM | POA: Diagnosis not present

## 2021-05-02 DIAGNOSIS — J3089 Other allergic rhinitis: Secondary | ICD-10-CM | POA: Diagnosis not present

## 2021-05-07 DIAGNOSIS — J3089 Other allergic rhinitis: Secondary | ICD-10-CM | POA: Diagnosis not present

## 2021-05-07 DIAGNOSIS — J301 Allergic rhinitis due to pollen: Secondary | ICD-10-CM | POA: Diagnosis not present

## 2021-05-14 DIAGNOSIS — J301 Allergic rhinitis due to pollen: Secondary | ICD-10-CM | POA: Diagnosis not present

## 2021-05-14 DIAGNOSIS — J3089 Other allergic rhinitis: Secondary | ICD-10-CM | POA: Diagnosis not present

## 2021-05-15 DIAGNOSIS — M1711 Unilateral primary osteoarthritis, right knee: Secondary | ICD-10-CM | POA: Diagnosis not present

## 2021-05-15 DIAGNOSIS — M1712 Unilateral primary osteoarthritis, left knee: Secondary | ICD-10-CM | POA: Diagnosis not present

## 2021-05-16 ENCOUNTER — Telehealth: Payer: Medicare HMO | Admitting: Physical Medicine and Rehabilitation

## 2021-05-16 NOTE — Telephone Encounter (Signed)
Patient had right L4-5 IL on 5/19. She states that she had about 30%-50% relief of her pain for a short time. Pain has returned- leg and buttock pain. Please advise.

## 2021-05-16 NOTE — Telephone Encounter (Signed)
[employer]. Needs auth and scheduling for bilateral L4 TF.

## 2021-05-16 NOTE — Telephone Encounter (Signed)
Pt called stating she had an inj on 5/19 with Dr. Ernestina Patches and he told her to call if she was experiencing pain. Pt is in pain but did not know if she only needed to speak with the nurse or if this would require an appt to see Dr. Ernestina Patches. The best call back number is [phone].

## 2021-05-21 ENCOUNTER — Telehealth: Payer: Self-pay

## 2021-05-21 NOTE — Telephone Encounter (Signed)
Patient called she is returning your call ,call back:[phone]

## 2021-05-22 DIAGNOSIS — M1711 Unilateral primary osteoarthritis, right knee: Secondary | ICD-10-CM | POA: Diagnosis not present

## 2021-05-22 DIAGNOSIS — J301 Allergic rhinitis due to pollen: Secondary | ICD-10-CM | POA: Diagnosis not present

## 2021-05-22 DIAGNOSIS — J3089 Other allergic rhinitis: Secondary | ICD-10-CM | POA: Diagnosis not present

## 2021-05-23 DIAGNOSIS — G8929 Other chronic pain: Secondary | ICD-10-CM | POA: Diagnosis not present

## 2021-05-23 DIAGNOSIS — Z6834 Body mass index (BMI) 34.0-34.9, adult: Secondary | ICD-10-CM | POA: Diagnosis not present

## 2021-05-23 DIAGNOSIS — I10 Essential (primary) hypertension: Secondary | ICD-10-CM | POA: Diagnosis not present

## 2021-05-23 DIAGNOSIS — E669 Obesity, unspecified: Secondary | ICD-10-CM | POA: Diagnosis not present

## 2021-05-23 DIAGNOSIS — M1990 Unspecified osteoarthritis, unspecified site: Secondary | ICD-10-CM | POA: Diagnosis not present

## 2021-05-23 DIAGNOSIS — J45909 Unspecified asthma, uncomplicated: Secondary | ICD-10-CM | POA: Diagnosis not present

## 2021-05-23 DIAGNOSIS — E1165 Type 2 diabetes mellitus with hyperglycemia: Secondary | ICD-10-CM | POA: Diagnosis not present

## 2021-05-23 DIAGNOSIS — M5430 Sciatica, unspecified side: Secondary | ICD-10-CM | POA: Diagnosis not present

## 2021-05-23 DIAGNOSIS — K219 Gastro-esophageal reflux disease without esophagitis: Secondary | ICD-10-CM | POA: Diagnosis not present

## 2021-05-23 DIAGNOSIS — B351 Tinea unguium: Secondary | ICD-10-CM | POA: Diagnosis not present

## 2021-05-27 DIAGNOSIS — J3089 Other allergic rhinitis: Secondary | ICD-10-CM | POA: Diagnosis not present

## 2021-05-27 DIAGNOSIS — J301 Allergic rhinitis due to pollen: Secondary | ICD-10-CM | POA: Diagnosis not present

## 2021-05-29 DIAGNOSIS — M1711 Unilateral primary osteoarthritis, right knee: Secondary | ICD-10-CM | POA: Diagnosis not present

## 2021-06-05 DIAGNOSIS — J301 Allergic rhinitis due to pollen: Secondary | ICD-10-CM | POA: Diagnosis not present

## 2021-06-05 DIAGNOSIS — J3089 Other allergic rhinitis: Secondary | ICD-10-CM | POA: Diagnosis not present

## 2021-06-09 DIAGNOSIS — J301 Allergic rhinitis due to pollen: Secondary | ICD-10-CM | POA: Diagnosis not present

## 2021-06-09 DIAGNOSIS — J3089 Other allergic rhinitis: Secondary | ICD-10-CM | POA: Diagnosis not present

## 2021-06-10 ENCOUNTER — Ambulatory Visit (INDEPENDENT_AMBULATORY_CARE_PROVIDER_SITE_OTHER): Payer: Medicare HMO | Admitting: Physical Medicine and Rehabilitation

## 2021-06-10 ENCOUNTER — Encounter: Payer: Self-pay | Admitting: Physical Medicine and Rehabilitation

## 2021-06-10 ENCOUNTER — Other Ambulatory Visit: Payer: Self-pay

## 2021-06-10 ENCOUNTER — Ambulatory Visit: Payer: Self-pay

## 2021-06-10 VITALS — BP 112/64 | HR 92

## 2021-06-10 DIAGNOSIS — M5416 Radiculopathy, lumbar region: Secondary | ICD-10-CM

## 2021-06-10 MED ORDER — METHYLPREDNISOLONE ACETATE 80 MG/ML IJ SUSP
80.0000 mg | Freq: Once | INTRAMUSCULAR | Status: AC
  Administered 2021-06-10: 80 mg

## 2021-06-10 NOTE — Progress Notes (Signed)
Lauren Lopez - 70 y.o. female MRN [id number]  Date of birth: 1951/06/25  Office Visit Note: Visit Date: 06/10/2021 PCP: Unk Pinto, MD Referred by: Unk Pinto, MD  Subjective: Chief Complaint  Patient presents with   Lower Back - Pain   Left Leg - Pain   Right Leg - Pain   HPI:  Lauren Lopez is a 70 y.o. female who comes in today for planned repeat Bilateral L4-L5  Lumbar Transforaminal epidural steroid injection with fluoroscopic guidance.  The patient has failed conservative care including home exercise, medications, time and activity modification.  This injection will be diagnostic and hopefully therapeutic.  Please see requesting physician notes for further details and justification. Patient received more than 50% pain relief from prior injection.   Referring: Dr. Meridee Score   ROS Otherwise per HPI.  Assessment & Plan: Visit Diagnoses:    ICD-10-CM   1. Lumbar radiculopathy  M54.16 XR C-ARM NO REPORT    Epidural Steroid injection    methylPREDNISolone acetate (DEPO-MEDROL) injection 80 mg      Plan: No additional findings.   Meds & Orders:  Meds ordered this encounter  Medications   methylPREDNISolone acetate (DEPO-MEDROL) injection 80 mg    Orders Placed This Encounter  Procedures   XR C-ARM NO REPORT   Epidural Steroid injection    Follow-up: Return if symptoms worsen or fail to improve.   Procedures: No procedures performed  Lumbosacral Transforaminal Epidural Steroid Injection - Sub-Pedicular Approach with Fluoroscopic Guidance  Patient: Lauren Lopez      Date of Birth: 09/18/1951 MRN: [id number] PCP: Unk Pinto, MD      Visit Date: 06/10/2021   Universal Protocol:    Date/Time: 06/10/2021  Consent Given By: the patient  Position: PRONE  Additional Comments: Vital signs were monitored before and after the procedure. Patient was prepped and draped in the usual sterile fashion. The correct patient, procedure, and  site was verified.   Injection Procedure Details:   Procedure diagnoses: Lumbar radiculopathy [M54.16]    Meds Administered:  Meds ordered this encounter  Medications   methylPREDNISolone acetate (DEPO-MEDROL) injection 80 mg    Laterality: Bilateral  Location/Site:  L4-L5  Needle:5.0 in., 22 ga.  Short bevel or Quincke spinal needle  Needle Placement: Transforaminal  Findings:    -Comments: Excellent flow of contrast along the nerve, nerve root and into the epidural space.  Procedure Details: After squaring off the end-plates to get a true AP view, the C-arm was positioned so that an oblique view of the foramen as noted above was visualized. The target area is just inferior to the "nose of the scotty dog" or sub pedicular. The soft tissues overlying this structure were infiltrated with 2-3 ml. of 1% Lidocaine without Epinephrine.  The spinal needle was inserted toward the target using a "trajectory" view along the fluoroscope beam.  Under AP and lateral visualization, the needle was advanced so it did not puncture dura and was located close the 6 O'Clock position of the pedical in AP tracterory. Biplanar projections were used to confirm position. Aspiration was confirmed to be negative for CSF and/or blood. A 1-2 ml. volume of Isovue-250 was injected and flow of contrast was noted at each level. Radiographs were obtained for documentation purposes.   After attaining the desired flow of contrast documented above, a 0.5 to 1.0 ml test dose of 0.25% Marcaine was injected into each respective transforaminal space.  The patient was observed for 90 seconds post  injection.  After no sensory deficits were reported, and normal lower extremity motor function was noted,   the above injectate was administered so that equal amounts of the injectate were placed at each foramen (level) into the transforaminal epidural space.   Additional Comments:  No complications occurred Dressing: 2 x 2  sterile gauze and Band-Aid    Post-procedure details: Patient was observed during the procedure. Post-procedure instructions were reviewed.  Patient left the clinic in stable condition.    Clinical History: MRI LUMBAR SPINE WITHOUT CONTRAST   TECHNIQUE: Multiplanar, multisequence MR imaging of the lumbar spine was performed. No intravenous contrast was administered.   COMPARISON:  Lumbar radiographs 02/24/2018 and earlier. CT Abdomen and Pelvis 10/20/2010. chest CTA 04/03/2016.   FINDINGS: Segmentation: 12 pairs of ribs including the lowest which are hypoplastic demonstrated on the 2017 CTA. Subsequently there are 4 lumbar type vertebral bodies. Therefore, the L5 level is fully sacralized with a vestigial L5-S1 disc space. Correlation with radiographs is recommended prior to any operative intervention.   Alignment: Chronic grade 1 anterolisthesis of L3 on L4 measuring 3-4 millimeters appears increased since 2011. Stable vertebral height and alignment elsewhere with mild straightening of lumbar lordosis.   Vertebrae: No marrow edema or evidence of acute osseous abnormality. Visualized bone marrow signal is within normal limits. Intact visible sacrum and SI joints.   Conus medullaris and cauda equina: Conus extends to the L1 level. No lower spinal cord or conus signal abnormality.   Paraspinal and other soft tissues: Stable since 2011 and negative visible abdominal viscera. Negative visualized posterior paraspinal soft tissues.   Disc levels:   T10-T11: Negative.   T11-T12: Negative.   T12-L1: Mild disc bulging with cephalad extruded disc fragment occupying the left ventral epidural space posterior to the T12 vertebral body (series 8, image 8, series 11, image 4. The extruded disc fragment encompasses 4 x 10 x 10 millimeters (AP by transverse by CC). This is in proximity to the descending left T12 nerve roots in the lateral recess. No significant spinal or foraminal  stenosis.   L1-L2: Minimal far lateral disc bulging and facet hypertrophy. No stenosis.   L2-L3: Mild far lateral disc bulging and endplate spurring. Moderate facet hypertrophy. No significant stenosis.   L3-L4: Grade 1 anterolisthesis. Disc desiccation and mild disc space loss with circumferential but mostly foraminal and far lateral disc bulging. Moderate to severe facet and ligament flavum hypertrophy. Mild spinal stenosis with mild bilateral lateral recess stenosis (L4 nerve levels), and mild to moderate bilateral L3 neural foraminal stenosis.   L4-L5: Vacuum disc. Circumferential disc bulge with broad-based foraminal involvement. Moderate facet and ligament flavum hypertrophy. No spinal or lateral recess stenosis. Mild to moderate bilateral L4 foraminal stenosis appears greater on the right.   L5-S1:  Sacralized and negative.   IMPRESSION: 1. 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 Correlation with radiographs is recommended prior to any operative intervention. 2. Grade 1 spondylolisthesis at L3-L4 is chronic but progressed since 2011. There is multifactorial mild spinal stenosis with mild lateral recess and up to moderate neural foraminal stenosis. Query right L3 and/or L4 radiculitis. 3. Up to moderate multifactorial neural foraminal stenosis also at L4-L5; the exiting L4 nerve levels. 4. There is a small left-side extruded disc fragment posterior to the T12 vertebral body. No significant spinal stenosis, this could be a source for left T12 radiculitis. 5. Mild for age lumbar spine degeneration elsewhere.     Electronically Signed   By: Lemmie Evens  [place] Crane M.D.   On: 06/21/2018 09:11     Objective:  VS:  HT:    WT:   BMI:     BP:112/64  HR:92bpm  TEMP: ( )  RESP:  Physical Exam Vitals and nursing note reviewed.  Constitutional:      General: She is not in acute distress.    Appearance: Normal appearance. She is obese. She is not  ill-appearing.  HENT:     Head: Normocephalic and atraumatic.     Right Ear: External ear normal.     Left Ear: External ear normal.  Eyes:     Extraocular Movements: Extraocular movements intact.  Cardiovascular:     Rate and Rhythm: Normal rate.     Pulses: Normal pulses.  Pulmonary:     Effort: Pulmonary effort is normal. No respiratory distress.  Abdominal:     General: There is no distension.     Palpations: Abdomen is soft.  Musculoskeletal:        General: Tenderness present.     Cervical back: Neck supple.     Right lower leg: No edema.     Left lower leg: No edema.     Comments: Patient has good distal strength with no pain over the greater trochanters.  No clonus or focal weakness.  Skin:    Findings: No erythema, lesion or rash.  Neurological:     General: No focal deficit present.     Mental Status: She is alert and oriented to person, place, and time.     Sensory: No sensory deficit.     Motor: No weakness or abnormal muscle tone.     Coordination: Coordination normal.  Psychiatric:        Mood and Affect: Mood normal.        Behavior: Behavior normal.     Imaging: No results found.

## 2021-06-10 NOTE — Progress Notes (Signed)
Pt state lower back pain that travels down both legs. Pt state walking, standing and laying down makes the pain worse. Pt state she takes over the counter pain meds to help ease her pain. Pt has hx of inj on 04/17/21 it helped.  Numeric Pain Rating Scale and Functional Assessment Average Pain 4   In the last MONTH (on 0-10 scale) has pain interfered with the following?  1. General activity like being  able to carry out your everyday physical activities such as walking, climbing stairs, carrying groceries, or moving a chair?  Rating(7)   +Driver, -BT, -Dye Allergies.

## 2021-06-10 NOTE — Patient Instructions (Signed)

## 2021-06-12 NOTE — Procedures (Signed)
Lumbosacral Transforaminal Epidural Steroid Injection - Sub-Pedicular Approach with Fluoroscopic Guidance  Patient: Lauren Lopez      Date of Birth: 1951/06/14 MRN: [id number] PCP: Unk Pinto, MD      Visit Date: 06/10/2021   Universal Protocol:    Date/Time: 06/10/2021  Consent Given By: the patient  Position: PRONE  Additional Comments: Vital signs were monitored before and after the procedure. Patient was prepped and draped in the usual sterile fashion. The correct patient, procedure, and site was verified.   Injection Procedure Details:   Procedure diagnoses: Lumbar radiculopathy [M54.16]    Meds Administered:  Meds ordered this encounter  Medications   methylPREDNISolone acetate (DEPO-MEDROL) injection 80 mg    Laterality: Bilateral  Location/Site:  L4-L5  Needle:5.0 in., 22 ga.  Short bevel or Quincke spinal needle  Needle Placement: Transforaminal  Findings:    -Comments: Excellent flow of contrast along the nerve, nerve root and into the epidural space.  Procedure Details: After squaring off the end-plates to get a true AP view, the C-arm was positioned so that an oblique view of the foramen as noted above was visualized. The target area is just inferior to the "nose of the scotty dog" or sub pedicular. The soft tissues overlying this structure were infiltrated with 2-3 ml. of 1% Lidocaine without Epinephrine.  The spinal needle was inserted toward the target using a "trajectory" view along the fluoroscope beam.  Under AP and lateral visualization, the needle was advanced so it did not puncture dura and was located close the 6 O'Clock position of the pedical in AP tracterory. Biplanar projections were used to confirm position. Aspiration was confirmed to be negative for CSF and/or blood. A 1-2 ml. volume of Isovue-250 was injected and flow of contrast was noted at each level. Radiographs were obtained for documentation purposes.   After attaining the  desired flow of contrast documented above, a 0.5 to 1.0 ml test dose of 0.25% Marcaine was injected into each respective transforaminal space.  The patient was observed for 90 seconds post injection.  After no sensory deficits were reported, and normal lower extremity motor function was noted,   the above injectate was administered so that equal amounts of the injectate were placed at each foramen (level) into the transforaminal epidural space.   Additional Comments:  No complications occurred Dressing: 2 x 2 sterile gauze and Band-Aid    Post-procedure details: Patient was observed during the procedure. Post-procedure instructions were reviewed.  Patient left the clinic in stable condition.

## 2021-06-18 DIAGNOSIS — J301 Allergic rhinitis due to pollen: Secondary | ICD-10-CM | POA: Diagnosis not present

## 2021-06-18 DIAGNOSIS — J3089 Other allergic rhinitis: Secondary | ICD-10-CM | POA: Diagnosis not present

## 2021-06-25 DIAGNOSIS — J3089 Other allergic rhinitis: Secondary | ICD-10-CM | POA: Diagnosis not present

## 2021-06-25 DIAGNOSIS — J301 Allergic rhinitis due to pollen: Secondary | ICD-10-CM | POA: Diagnosis not present

## 2021-06-30 ENCOUNTER — Telehealth: Payer: Self-pay

## 2021-06-30 ENCOUNTER — Telehealth: Payer: Self-pay | Admitting: Physical Medicine and Rehabilitation

## 2021-06-30 NOTE — Telephone Encounter (Signed)
Pt would like a call back regarding how she is doing.

## 2021-06-30 NOTE — Telephone Encounter (Signed)
Pt returning [place] call. Please call pt at [phone]. She states she will have her phone close by.

## 2021-07-01 DIAGNOSIS — J301 Allergic rhinitis due to pollen: Secondary | ICD-10-CM | POA: Diagnosis not present

## 2021-07-01 DIAGNOSIS — J3089 Other allergic rhinitis: Secondary | ICD-10-CM | POA: Diagnosis not present

## 2021-07-04 NOTE — Progress Notes (Signed)
AWV and 3 MONTH FOLLOW UP Assessment:   Annual Medicare Wellness Visit Due annually  Health maintenance reviewed DEXA ordered to schedule at [facility] hypertension - continue medications, DASH diet, exercise and monitor at home. Call if greater than 130/80.   Type 2 diabetes mellitus with CKD, without long-term current use of insulin ([name]) Discussed general issues about diabetes pathophysiology and management., Educational material distributed., Suggested low cholesterol diet., Encouraged aerobic exercise., Discussed foot care., Reminded to get yearly retinal exam. -     Hemoglobin A1c  Mixed hyperlipidemia associated with T2DM ([name]) LDL Goal <70; newly on rosuvastatin  decrease fatty foods increase activity. -     Lipid panel  Medication management -     Magnesium  Vitamin D deficiency Continue supplement, check levels annually and PRN  Age related osteoporosis, unspecified pathological fracture presence - on fosamax x 06/2017- 12/2019 - recheck DEXA - order placed to schedule - Continue high calcium diet, vit D supplement - restart fosamax as indicated  Obesity - follow up 3 months for progress monitoring - increase veggies, decrease carbs - long discussion about weight loss, diet, and exercise  Right-sided low back pain without sciatica, unspecified chronicity Ortho follows Continue exercise and weight loss  Gastroesophageal reflux disease, esophagitis presence not specified Continue PPI/H2 blocker, diet discussed  Uncomplicated asthma, unspecified asthma severity, unspecified whether persistent Monitor, doing well with current regimen   Lipoma of right upper extremity She reports intermittent but increasing discomfort associated with soft tissue mass of post right shoulder present and monitored for many years Requesting referral for removal - gen surgery placed today   Hot flashes Has failed lexapro, paxil, effexor; GYN stopped estrogen; she is doing  well with estroven complete. Weight loss encouraged  Skin tags Patient requests removal - schedule in procedure room   Orders Placed This Encounter  Procedures   DG Bone Density   CBC with Differential/Platelet   COMPLETE METABOLIC PANEL WITH GFR   Magnesium   Lipid panel   TSH   Hemoglobin A1c   Vitamin B12     Over 40 minutes of exam, counseling, chart review, and critical decision making was performed  Future Appointments  Date Time Provider [place]  01/01/2022  3:00 PM Liane Comber, NP [facility] None  07/07/2022 10:30 AM Liane Comber, NP [facility] None    Plan:   During the course of the visit the patient was educated and counseled about appropriate screening and preventive services including:   Pneumococcal vaccine  Prevnar 13 Influenza vaccine Td vaccine Screening electrocardiogram Bone densitometry screening Colorectal cancer screening Diabetes screening Glaucoma screening Nutrition counseling  Advanced directives: requested   Subjective:  Lauren Lopez is a 70 y.o. AA female who presents for AWV and 3 month follow up.  She is married, 2 grown daughter, 2 grandchildren live in [place], [place]. She is retired Scientist, forensic associated. Husband has leukemia, they are monitoring it, he is having dementia as well.   She is s/p TAH, did have follow up with GYN. She does have  hot flashes, was on estrogen s/p hysterectomy but GYN tapered off; having sx day and night could not tolerate the lexapro, paxil, effexor, has been on estroven complete with benefit.   She was following with ortho for her back pain, following with Dr. Ernestina Patches and has had nerve abation and injections with benefit more recently. . She had negative PTH. She had negative RA workup in 2017. Has seen chiropractic with benefit in the  past but not recently due to cost. Also follows with ortho Dr. Wynelle Link for R knee pain getting injections, does need replacement at some point.   BMI is Body  mass index is 33.92 kg/m., she has been working on diet and exercise, she has joined gym, going weights and walking, 1 hour 5 days a week. Admits irregular eating, does try to eat more salads. Has cut down on coffee from 5 cups to 2 cups/day.  Wt Readings from Last 3 Encounters:  07/07/21 193 lb (87.5 kg)  04/03/21 192 lb 6.4 oz (87.3 kg)  01/01/21 192 lb 12.8 oz (87.5 kg)   Her blood pressure has been controlled at home, today their BP is BP: 120/76 She does workout.  She denies chest pain, shortness of breath, dizziness.   She is on cholesterol medication (newly on rosuvastatin 10 mg daily) and denies myalgias. Her cholesterol is not at goal. The cholesterol last visit was:   Lab Results  Component Value Date   CHOL 221 (H) 04/03/2021   HDL 84 04/03/2021   LDLCALC 118 (H) 04/03/2021   TRIG 86 04/03/2021   CHOLHDL 2.6 04/03/2021   She has been working on diet and exercise for diabetes with CKD, controlled with diet and metformin, she has worked hard to get it down, and denies foot ulcerations, hyperglycemia, hypoglycemia , increased appetite, nausea, paresthesia of the feet, polydipsia, polyuria, vomiting and weight loss.  She takes metformin 500 mg BID, not TID Last A1C in the office was:  Lab Results  Component Value Date   HGBA1C 6.3 (H) 04/03/2021   Last GFR Lab Results  Component Value Date   GFRAA 80 04/03/2021   Patient is on Vitamin D supplement, 10000 IU daily  Lab Results  Component Value Date   VD25OH 72 04/03/2021   She continues on B12 supplement, taking 2 tabs daily, unsure of dose, hasn't changed dose Lab Results  Component Value Date   VITAMINB12 >2,000 (H) 12/22/2018     Medication Review:  Current Outpatient Medications (Endocrine & Metabolic):    metFORMIN (GLUCOPHAGE) 500 MG tablet, Take 1 tab twice daily with food for diabetes. Do not increase dose due to causing diarrhea.  Current Outpatient Medications (Cardiovascular):    hydrochlorothiazide  (HYDRODIURIL) 25 MG tablet, TAKE 1 TABLET BY MOUTH EVERY DAY   rosuvastatin (CRESTOR) 10 MG tablet, Take  1 tablet  Daily  for Cholesterol  Current Outpatient Medications (Respiratory):    fexofenadine (ALLEGRA) 180 MG tablet, Take 1 tablet (180 mg total) by mouth daily.   fluticasone-salmeterol (ADVAIR HFA) 115-21 MCG/ACT inhaler, Inhale 2 puffs into the lungs 2 (two) times daily.   Current Outpatient Medications (Hematological):    cyanocobalamin 100 MCG tablet, Take 100 mcg by mouth daily.  Current Outpatient Medications (Other):    blood glucose meter kit and supplies, Test sugars twice daily. Dx E11.9   Cholecalciferol (VITAMIN D3) 5000 UNITS TABS, Take 10,000 Units by mouth daily.   cyclobenzaprine (FLEXERIL) 10 MG tablet, TAKE 1 TABLET BY MOUTH AT BEDTIME AS NEEDED FOR MUSCLE SPASMS   Lancets (ONETOUCH DELICA PLUS [id number]) MISC, TEST SUGAR TWICE A DAY   MAGNESIUM PO, Take 500 mg by mouth daily.    Misc Natural Products (NEURIVA PO), Take by mouth daily.   omeprazole (PRILOSEC) 40 MG capsule, TAKE 1 CAPSULE DAILY FOR INDIGESTION & HEARTBURN   ONETOUCH ULTRA test strip, TEST SUGAR TWICE A DAY   Potassium 99 MG TABS, Take by mouth 2 (two) times daily.  Probiotic Product (PROBIOTIC-10 PO), Take by mouth daily.   terbinafine (LAMISIL) 250 MG tablet, Take 1 tablet Daily for Toenail Fungus  Allergies: Allergies  Allergen Reactions   Metronidazole Other (See Comments)    unknown   Pneumovax 23 [Pneumococcal Vac Polyvalent]     Redness and severe swelling at injection site   Ppd [Tuberculin Purified Protein Derivative] Other (See Comments)    + PPD 2013 with NEG CXR 05/2012    Current Problems (verified) has Essential hypertension; GERD (gastroesophageal reflux disease); Vitamin D deficiency; Asthma; Medication management; Back pain; Osteoporosis; Obesity (BMI 30.0-34.9); Hyperlipidemia associated with type 2 diabetes mellitus ([place]); CKD stage 2 due to type 2 diabetes mellitus  ([place]); Type 2 diabetes mellitus with stage 2 chronic kidney disease, without long-term current use of insulin ([place]); Lipoma of right upper extremity; and Allergic rhinitis due to pollen on their problem list.  Screening Tests Immunization History  Administered Date(s) Administered   Influenza, High Dose Seasonal PF 11/10/2017, 09/15/2018, 09/04/2019, 04/26/2020   Influenza, Seasonal, Injecte, Preservative Fre 10/17/2015   Influenza,inj,quad, With Preservative 10/15/2016   Influenza-Unspecified 08/30/2020   PFIZER(Purple Top)SARS-COV-2 Vaccination 12/22/2019, 01/12/2020, 04/26/2020, 04/01/2021   Pneumococcal Conjugate-13 12/22/2018   Pneumococcal Polysaccharide-23 02/04/2017   Pneumococcal-Unspecified 06/27/2013   Tdap 04/23/2012   Zoster, Live 07/31/2015    Preventative care: Last colonoscopy: 2019 had 6 polyps, 5 years Dr. Earlean Shawl  Pap: Hysterectomy, DONE Mammogram:  06/2020 goes to [facility], has scheduled 07/18/2021 DEXA: 07/2019 osteopenia, at [facility] started fosamax 06/2017 but stopped due to jaw pain however doesn't believe this was r/t fosamax, has DEXA order in   Tetanus: 2013 Influenza: 08/2020 Pneumonia: 2/2, done Shingles - zoster, 2016 - check about shingrix  Covid 19: 2/2, 2021, pfizer + 2 boosters  Names of Other Physician/Practitioners you currently use: 1. [facility] here for primary care 2. Dr. Katy Fitch, eye doctor, last visit 2022, report requested 3. Dr. Mariea Clonts  , dentist, last visit 2022, goes q62m Patient Care Team: MUnk Pinto MD as PCP - General (Internal Medicine) Sypher, RHerbie Baltimore MD (Inactive) as Consulting Physician (Orthopedic Surgery) HMonna Fam MD as Consulting Physician (Ophthalmology) MRichmond Campbell MD as Consulting Physician (Gastroenterology) MCheri Fowler MD as Consulting Physician (Obstetrics and Gynecology) AGaynelle Arabian MD as Consulting Physician (Orthopedic Surgery) LMarchia Bond MD as  Consulting Physician (Orthopedic Surgery) SMosetta Anis MD as Referring Physician (Allergy) MLarey Dresser MD as Consulting Physician (Cardiology)  Surgical: She  has a past surgical history that includes Bilateral salpingoophorectomy (11/28/2010); Knee arthroscopy (Bilateral, 2005-2016); Colonoscopy; Carpal tunnel release (07/15/2012); Shoulder arthroscopy with rotator cuff repair and subacromial decompression (Right, 01/20/2013); Abdominal hysterectomy; Carpal tunnel release (Right, 05/31/2014); and Tubal ligation. Family Her family history includes Alzheimer's disease in her father; Cirrhosis in her brother; Dementia in her mother; Diabetes in her mother; Hypertension in her brother, father, and mother; Tongue cancer in her sister. Social history  She reports that she has never smoked. She has never used smokeless tobacco. She reports that she does not drink alcohol and does not use drugs.   MEDICARE WELLNESS OBJECTIVES: Physical activity: Current Exercise Habits: Home exercise routine, Type of exercise: strength training/weights;walking, Time (Minutes): 60, Frequency (Times/Week): 5, Weekly Exercise (Minutes/Week): 300, Intensity: Mild, Exercise limited by: None identified Cardiac risk factors: Cardiac Risk Factors include: advanced age (>559m, >6>49omen);dyslipidemia;hypertension;diabetes mellitus;obesity (BMI >30kg/m2) Depression/mood screen:   [facility] 07/07[facility]  Decreased Interest 0  Down, Depressed, Hopeless 0  PHQ - 2 Score 0  ADLs:  In your present state of health, do you have any difficulty performing the following activities: 07/07/2021 04/03/2021  Hearing? N -  Vision? N N  Difficulty concentrating or making decisions? N N  Walking or climbing stairs? N N  Dressing or bathing? N N  Doing errands, shopping? N N  Some recent data might be hidden     Cognitive Testing  Alert? Yes  Normal Appearance?Yes  Oriented to person? Yes  Place? Yes   Time?  Yes  Recall of three objects?  Yes  Can perform simple calculations? Yes  Displays appropriate judgment?Yes  Can read the correct time from a watch face?Yes  EOL planning: Does Patient Have a Medical Advance Directive?: No Would patient like information on creating a medical advance directive?: No - Patient declined     Objective:   Today's Vitals   07/07/21 1044  BP: 120/76  Pulse: 74  Temp: (!) 96.6 F (35.9 C)  SpO2: 99%  Weight: 193 lb (87.5 kg)    Body mass index is 33.92 kg/m.  General appearance: alert, no distress, WD/WN, female HEENT: normocephalic, sclerae anicteric, TMs pearly, nares patent, no discharge or erythema, pharynx normal Oral cavity: MMM, no lesions Neck: supple, no lymphadenopathy, no thyromegaly, no masses Heart: RRR, normal S1, S2, no murmurs Lungs: CTA bilaterally, no wheezes, rhonchi, or rales Abdomen: +bs, soft, non tender, non distended, no masses, no hepatomegaly, no splenomegaly Musculoskeletal: nontender, no swelling, no obvious deformity, normal gait Extremities: no edema, no cyanosis, no clubbing. She has approx 8 cm rubbery soft tissue mass to post R shoulder, clear borders.  Pulses: 2+ symmetric, upper and lower extremities, normal cap refill Neurological: alert, oriented x 3, CN2-12 intact, strength normal upper extremities and lower extremities, sensation normal throughout, DTRs 2+ throughout, no cerebellar signs, gait normal Psychiatric: normal affect, behavior normal, pleasant  Skin: warm, dry, intact; no rash or concerning lesions, has several skin tags to neck.    Medicare Attestation I have personally reviewed: The patient's medical and social history Their use of alcohol, tobacco or illicit drugs Their current medications and supplements The patient's functional ability including ADLs,fall risks, home safety risks, cognitive, and hearing and visual impairment Diet and physical activities Evidence for depression or mood  disorders  The patient's weight, height, BMI, and visual acuity have been recorded in the chart.  I have made referrals, counseling, and provided education to the patient based on review of the above and I have provided the patient with a written personalized care plan for preventive services.      Izora Ribas, NP   07/07/2021

## 2021-07-07 ENCOUNTER — Encounter: Payer: Self-pay | Admitting: Adult Health

## 2021-07-07 ENCOUNTER — Other Ambulatory Visit: Payer: Self-pay

## 2021-07-07 ENCOUNTER — Ambulatory Visit (INDEPENDENT_AMBULATORY_CARE_PROVIDER_SITE_OTHER): Payer: Medicare HMO | Admitting: Adult Health

## 2021-07-07 VITALS — BP 120/76 | HR 74 | Temp 96.6°F | Wt 193.0 lb

## 2021-07-07 DIAGNOSIS — K219 Gastro-esophageal reflux disease without esophagitis: Secondary | ICD-10-CM | POA: Diagnosis not present

## 2021-07-07 DIAGNOSIS — N182 Chronic kidney disease, stage 2 (mild): Secondary | ICD-10-CM

## 2021-07-07 DIAGNOSIS — M810 Age-related osteoporosis without current pathological fracture: Secondary | ICD-10-CM

## 2021-07-07 DIAGNOSIS — D1721 Benign lipomatous neoplasm of skin and subcutaneous tissue of right arm: Secondary | ICD-10-CM | POA: Diagnosis not present

## 2021-07-07 DIAGNOSIS — E1169 Type 2 diabetes mellitus with other specified complication: Secondary | ICD-10-CM

## 2021-07-07 DIAGNOSIS — E785 Hyperlipidemia, unspecified: Secondary | ICD-10-CM

## 2021-07-07 DIAGNOSIS — R6889 Other general symptoms and signs: Secondary | ICD-10-CM | POA: Diagnosis not present

## 2021-07-07 DIAGNOSIS — Z0001 Encounter for general adult medical examination with abnormal findings: Secondary | ICD-10-CM | POA: Diagnosis not present

## 2021-07-07 DIAGNOSIS — M545 Low back pain, unspecified: Secondary | ICD-10-CM

## 2021-07-07 DIAGNOSIS — I10 Essential (primary) hypertension: Secondary | ICD-10-CM | POA: Diagnosis not present

## 2021-07-07 DIAGNOSIS — E559 Vitamin D deficiency, unspecified: Secondary | ICD-10-CM

## 2021-07-07 DIAGNOSIS — J301 Allergic rhinitis due to pollen: Secondary | ICD-10-CM

## 2021-07-07 DIAGNOSIS — Z0000 Encounter for general adult medical examination without abnormal findings: Secondary | ICD-10-CM

## 2021-07-07 DIAGNOSIS — J45909 Unspecified asthma, uncomplicated: Secondary | ICD-10-CM

## 2021-07-07 DIAGNOSIS — E1122 Type 2 diabetes mellitus with diabetic chronic kidney disease: Secondary | ICD-10-CM

## 2021-07-07 DIAGNOSIS — E538 Deficiency of other specified B group vitamins: Secondary | ICD-10-CM

## 2021-07-07 DIAGNOSIS — E669 Obesity, unspecified: Secondary | ICD-10-CM

## 2021-07-07 DIAGNOSIS — Z79899 Other long term (current) drug therapy: Secondary | ICD-10-CM | POA: Diagnosis not present

## 2021-07-07 MED ORDER — METFORMIN HCL 500 MG PO TABS: ORAL_TABLET | ORAL | 3 refills | Status: DC

## 2021-07-07 NOTE — Patient Instructions (Addendum)
  Please check with insurance about shingrix - 2 shots, 2-6 months apart, can get at pharmacy     Zoster Vaccine, Recombinant injection What is this medication? ZOSTER VACCINE (ZOS ter vak SEEN) is a vaccine used to reduce the risk of getting shingles. This vaccine is not used to treat shingles or nerve pain fromshingles. This medicine may be used for other purposes; ask your health care provider orpharmacist if you have questions. COMMON BRAND NAME(S): [facility] What should I tell my care team before I take this medication? They need to know if you have any of these conditions: cancer immune system problems an unusual or allergic reaction to Zoster vaccine, other medications, foods, dyes, or preservatives pregnant or trying to get pregnant breast-feeding How should I use this medication? This vaccine is injected into a muscle. It is given by a health care provider. A copy of Vaccine Information Statements will be given before each vaccination. Be sure to read this information carefully each time. This sheet may changeoften. Talk to your health care provider about the use of this vaccine in children.This vaccine is not approved for use in children. Overdosage: If you think you have taken too much of this medicine contact apoison control center or emergency room at once. NOTE: This medicine is only for you. Do not share this medicine with others. What if I miss a dose? Keep appointments for follow-up (booster) doses. It is important not to miss your dose. Call your health care provider if you are unable to keep anappointment. What may interact with this medication? medicines that suppress your immune system medicines to treat cancer steroid medicines like prednisone or cortisone This list may not describe all possible interactions. Give your health care provider a list of all the medicines, herbs, non-prescription drugs, or dietary supplements you use. Also tell them if you smoke, drink  alcohol, or use illegaldrugs. Some items may interact with your medicine. What should I watch for while using this medication? Visit your health care provider regularly. This vaccine, like all vaccines, may not fully protect everyone. What side effects may I notice from receiving this medication? Side effects that you should report to your doctor or health care professionalas soon as possible: allergic reactions (skin rash, itching or hives; swelling of the face, lips, or tongue) trouble breathing Side effects that usually do not require medical attention (report these toyour doctor or health care professional if they continue or are bothersome): chills headache fever nausea pain, redness, or irritation at site where injected tiredness vomiting This list may not describe all possible side effects. Call your doctor for medical advice about side effects. You may report side effects to FDA at1-800-FDA-1088. Where should I keep my medication? This vaccine is only given by a health care provider. It will not be stored athome. NOTE: This sheet is a summary. It may not cover all possible information. If you have questions about this medicine, talk to your doctor, pharmacist, orhealth care provider.  2022 Elsevier/Gold Standard (2019-12-22 16:23:07)

## 2021-07-08 ENCOUNTER — Other Ambulatory Visit: Payer: Self-pay | Admitting: Adult Health

## 2021-07-08 DIAGNOSIS — E1169 Type 2 diabetes mellitus with other specified complication: Secondary | ICD-10-CM

## 2021-07-08 LAB — COMPLETE METABOLIC PANEL WITH GFR
AG Ratio: 1.6 (calc) (ref 1.0–2.5)
ALT: 19 U/L (ref 6–29)
AST: 24 U/L (ref 10–35)
Albumin: 4.4 g/dL (ref 3.6–5.1)
Alkaline phosphatase (APISO): 109 U/L (ref 37–153)
BUN: 14 mg/dL (ref 7–25)
CO2: 29 mmol/L (ref 20–32)
Calcium: 10.2 mg/dL (ref 8.6–10.4)
Chloride: 103 mmol/L (ref 98–110)
Creat: 0.82 mg/dL (ref 0.50–1.05)
Globulin: 2.8 g/dL (calc) (ref 1.9–3.7)
Glucose, Bld: 113 mg/dL — ABNORMAL HIGH (ref 65–99)
Potassium: 4.6 mmol/L (ref 3.5–5.3)
Sodium: 142 mmol/L (ref 135–146)
Total Bilirubin: 0.5 mg/dL (ref 0.2–1.2)
Total Protein: 7.2 g/dL (ref 6.1–8.1)
eGFR: 77 mL/min/{1.73_m2} (ref >=60)

## 2021-07-08 LAB — LIPID PANEL
Cholesterol: 188 mg/dL (ref <200)
HDL: 89 mg/dL (ref >=50)
LDL Cholesterol (Calc): 85 mg/dL (calc)
Non-HDL Cholesterol (Calc): 99 mg/dL (calc) (ref <130)
Total CHOL/HDL Ratio: 2.1 (calc) (ref <5.0)
Triglycerides: 63 mg/dL (ref <150)

## 2021-07-08 LAB — CBC WITH DIFFERENTIAL/PLATELET
Absolute Monocytes: 341 cells/uL (ref 200–950)
Basophils Absolute: 22 cells/uL (ref 0–200)
Basophils Relative: 0.4 %
Eosinophils Absolute: 83 cells/uL (ref 15–500)
Eosinophils Relative: 1.5 %
HCT: 38.8 % (ref 35.0–45.0)
Hemoglobin: 12.1 g/dL (ref 11.7–15.5)
Lymphs Abs: 2261 cells/uL (ref 850–3900)
MCH: 26.9 pg — ABNORMAL LOW (ref 27.0–33.0)
MCHC: 31.2 g/dL — ABNORMAL LOW (ref 32.0–36.0)
MCV: 86.4 fL (ref 80.0–100.0)
MPV: 11.6 fL (ref 7.5–12.5)
Monocytes Relative: 6.2 %
Neutro Abs: 2794 cells/uL (ref 1500–7800)
Neutrophils Relative %: 50.8 %
Platelets: 213 10*3/uL (ref 140–400)
RBC: 4.49 10*6/uL (ref 3.80–5.10)
RDW: 13.8 % (ref 11.0–15.0)
Total Lymphocyte: 41.1 %
WBC: 5.5 10*3/uL (ref 3.8–10.8)

## 2021-07-08 LAB — TSH: TSH: 1.13 mIU/L (ref 0.40–4.50)

## 2021-07-08 LAB — HEMOGLOBIN A1C
Hgb A1c MFr Bld: 6.4 % of total Hgb — ABNORMAL HIGH (ref <5.7)
Mean Plasma Glucose: 137 mg/dL
eAG (mmol/L): 7.6 mmol/L

## 2021-07-08 LAB — VITAMIN B12: Vitamin B-12: >2000 pg/mL — ABNORMAL HIGH (ref 200–1100)

## 2021-07-08 LAB — MAGNESIUM: Magnesium: 1.9 mg/dL (ref 1.5–2.5)

## 2021-07-08 MED ORDER — ROSUVASTATIN CALCIUM 20 MG PO TABS: ORAL_TABLET | ORAL | 3 refills | Status: DC

## 2021-07-09 DIAGNOSIS — J3089 Other allergic rhinitis: Secondary | ICD-10-CM | POA: Diagnosis not present

## 2021-07-09 DIAGNOSIS — J301 Allergic rhinitis due to pollen: Secondary | ICD-10-CM | POA: Diagnosis not present

## 2021-07-10 DIAGNOSIS — H43823 Vitreomacular adhesion, bilateral: Secondary | ICD-10-CM | POA: Diagnosis not present

## 2021-07-10 DIAGNOSIS — H401132 Primary open-angle glaucoma, bilateral, moderate stage: Secondary | ICD-10-CM | POA: Diagnosis not present

## 2021-07-10 DIAGNOSIS — E119 Type 2 diabetes mellitus without complications: Secondary | ICD-10-CM | POA: Diagnosis not present

## 2021-07-10 DIAGNOSIS — Z961 Presence of intraocular lens: Secondary | ICD-10-CM | POA: Diagnosis not present

## 2021-07-10 LAB — HM DIABETES EYE EXAM

## 2021-07-15 DIAGNOSIS — J301 Allergic rhinitis due to pollen: Secondary | ICD-10-CM | POA: Diagnosis not present

## 2021-07-15 DIAGNOSIS — J3089 Other allergic rhinitis: Secondary | ICD-10-CM | POA: Diagnosis not present

## 2021-07-18 DIAGNOSIS — M8589 Other specified disorders of bone density and structure, multiple sites: Secondary | ICD-10-CM | POA: Diagnosis not present

## 2021-07-18 DIAGNOSIS — Z1231 Encounter for screening mammogram for malignant neoplasm of breast: Secondary | ICD-10-CM | POA: Diagnosis not present

## 2021-07-18 DIAGNOSIS — Z780 Asymptomatic menopausal state: Secondary | ICD-10-CM | POA: Diagnosis not present

## 2021-07-18 LAB — HM DEXA SCAN

## 2021-07-22 DIAGNOSIS — J301 Allergic rhinitis due to pollen: Secondary | ICD-10-CM | POA: Diagnosis not present

## 2021-07-22 DIAGNOSIS — J3089 Other allergic rhinitis: Secondary | ICD-10-CM | POA: Diagnosis not present

## 2021-07-24 ENCOUNTER — Encounter: Payer: Self-pay | Admitting: Internal Medicine

## 2021-07-29 ENCOUNTER — Telehealth: Payer: Self-pay | Admitting: Physical Medicine and Rehabilitation

## 2021-07-29 DIAGNOSIS — J3089 Other allergic rhinitis: Secondary | ICD-10-CM | POA: Diagnosis not present

## 2021-07-29 DIAGNOSIS — J301 Allergic rhinitis due to pollen: Secondary | ICD-10-CM | POA: Diagnosis not present

## 2021-07-29 NOTE — Telephone Encounter (Signed)
Pt called and would like to schedule an injection with Dr.Newton. She would like the same one as before bilateral L4 TF  CB [phone]

## 2021-07-31 ENCOUNTER — Other Ambulatory Visit: Payer: Self-pay

## 2021-07-31 MED ORDER — OMEPRAZOLE 40 MG PO CPDR: DELAYED_RELEASE_CAPSULE | ORAL | 3 refills | Status: DC

## 2021-08-01 DIAGNOSIS — M1711 Unilateral primary osteoarthritis, right knee: Secondary | ICD-10-CM | POA: Diagnosis not present

## 2021-08-01 DIAGNOSIS — M170 Bilateral primary osteoarthritis of knee: Secondary | ICD-10-CM | POA: Diagnosis not present

## 2021-08-06 ENCOUNTER — Ambulatory Visit: Payer: Self-pay

## 2021-08-06 ENCOUNTER — Encounter: Payer: Self-pay | Admitting: Physical Medicine and Rehabilitation

## 2021-08-06 ENCOUNTER — Ambulatory Visit (INDEPENDENT_AMBULATORY_CARE_PROVIDER_SITE_OTHER): Payer: Medicare HMO | Admitting: Physical Medicine and Rehabilitation

## 2021-08-06 ENCOUNTER — Other Ambulatory Visit: Payer: Self-pay

## 2021-08-06 VITALS — BP 137/87 | HR 92

## 2021-08-06 DIAGNOSIS — M5416 Radiculopathy, lumbar region: Secondary | ICD-10-CM

## 2021-08-06 MED ORDER — METHYLPREDNISOLONE ACETATE 80 MG/ML IJ SUSP
80.0000 mg | Freq: Once | INTRAMUSCULAR | Status: AC
  Administered 2021-08-06: 80 mg

## 2021-08-06 NOTE — Progress Notes (Signed)
Pt state lower back pain that travels to her buttocks, down to both knees. Pt state walking, standing and bending makes the pain worse. Pt state she takes over the counter pain meds to help ease her pain. Pt has hx of inj on 06/10/21 pt state it helped.  Numeric Pain Rating Scale and Functional Assessment Average Pain 3   In the last MONTH (on 0-10 scale) has pain interfered with the following?  1. General activity like being  able to carry out your everyday physical activities such as walking, climbing stairs, carrying groceries, or moving a chair?  Rating(10)   +Driver, -BT, -Dye Allergies.

## 2021-08-06 NOTE — Patient Instructions (Signed)

## 2021-08-06 NOTE — Procedures (Signed)
Lumbosacral Transforaminal Epidural Steroid Injection - Sub-Pedicular Approach with Fluoroscopic Guidance  Patient: Lauren Lopez      Date of Birth: [birth year] MRN: [id number] PCP: Unk Pinto, MD      Visit Date: 08/06/2021   Universal Protocol:    Date/Time: 08/06/2021  Consent Given By: the patient  Position: PRONE  Additional Comments: Vital signs were monitored before and after the procedure. Patient was prepped and draped in the usual sterile fashion. The correct patient, procedure, and site was verified.   Injection Procedure Details:   Procedure diagnoses: Lumbar radiculopathy [M54.16]    Meds Administered:  Meds ordered this encounter  Medications   methylPREDNISolone acetate (DEPO-MEDROL) injection 80 mg    Laterality: Bilateral  Location/Site:  L4-L5  Needle:5.0 in., 22 ga.  Short bevel or Quincke spinal needle  Needle Placement: Transforaminal  Findings:    -Comments: Excellent flow of contrast along the nerve, nerve root and into the epidural space.  Procedure Details: After squaring off the end-plates to get a true AP view, the C-arm was positioned so that an oblique view of the foramen as noted above was visualized. The target area is just inferior to the "nose of the scotty dog" or sub pedicular. The soft tissues overlying this structure were infiltrated with 2-3 ml. of 1% Lidocaine without Epinephrine.  The spinal needle was inserted toward the target using a "trajectory" view along the fluoroscope beam.  Under AP and lateral visualization, the needle was advanced so it did not puncture dura and was located close the 6 O'Clock position of the pedical in AP tracterory. Biplanar projections were used to confirm position. Aspiration was confirmed to be negative for CSF and/or blood. A 1-2 ml. volume of Isovue-250 was injected and flow of contrast was noted at each level. Radiographs were obtained for documentation purposes.   After attaining the  desired flow of contrast documented above, a 0.5 to 1.0 ml test dose of 0.25% Marcaine was injected into each respective transforaminal space.  The patient was observed for 90 seconds post injection.  After no sensory deficits were reported, and normal lower extremity motor function was noted,   the above injectate was administered so that equal amounts of the injectate were placed at each foramen (level) into the transforaminal epidural space.   Additional Comments:  The patient tolerated the procedure well Dressing: 2 x 2 sterile gauze and Band-Aid    Post-procedure details: Patient was observed during the procedure. Post-procedure instructions were reviewed.  Patient left the clinic in stable condition.

## 2021-08-06 NOTE — Progress Notes (Signed)
Lauren Lopez - 70 y.o. female MRN [id number]  Date of birth: 1951-09-20  Office Visit Note: Visit Date: 08/06/2021 PCP: Unk Pinto, MD Referred by: Unk Pinto, MD  Subjective: Chief Complaint  Patient presents with   Lower Back - Pain   Right Knee - Pain   Left Knee - Pain   HPI:  Lauren Lopez is a 70 y.o. female who comes in today for planned repeat Bilateral L4-L5  Lumbar Transforaminal epidural steroid injection with fluoroscopic guidance.  The patient has failed conservative care including home exercise, medications, time and activity modification.  This injection will be diagnostic and hopefully therapeutic.  Please see requesting physician notes for further details and justification. Patient received more than 50% pain relief from prior injection.  Prior MRI in 2019 showed mainly arthritic changes at L3-4 with lateral recess narrowing and biforaminal narrowing at L4.  No high-grade central stenosis.  Last injection gave her quite a bit of relief but just did not last very long.  She says she was doing a lot of work around the house and symptoms have just flared up.  She has had extensive physical therapy by Cheral Almas.  She has had orthopedic follow-up with Dr. Sharol Given in the past.  She has not seen them recently.  She is also had extensive chiropractic care with Courtney Paris.  Multiple visits in 2021.  Depending on repeat relief today would look at updating MRI of the lumbar spine.  Referring: Dr. Meridee Score   ROS Otherwise per HPI.  Assessment & Plan: Visit Diagnoses:    ICD-10-CM   1. Lumbar radiculopathy  M54.16 XR C-ARM NO REPORT    Epidural Steroid injection    methylPREDNISolone acetate (DEPO-MEDROL) injection 80 mg      Plan: No additional findings.   Meds & Orders:  Meds ordered this encounter  Medications   methylPREDNISolone acetate (DEPO-MEDROL) injection 80 mg    Orders Placed This Encounter  Procedures   XR C-ARM NO REPORT    Epidural Steroid injection    Follow-up: Return if symptoms worsen or fail to improve.   Procedures: No procedures performed  Lumbosacral Transforaminal Epidural Steroid Injection - Sub-Pedicular Approach with Fluoroscopic Guidance  Patient: Lauren Lopez      Date of Birth: [birth year] MRN: [id number] PCP: Unk Pinto, MD      Visit Date: 08/06/2021   Universal Protocol:    Date/Time: 08/06/2021  Consent Given By: the patient  Position: PRONE  Additional Comments: Vital signs were monitored before and after the procedure. Patient was prepped and draped in the usual sterile fashion. The correct patient, procedure, and site was verified.   Injection Procedure Details:   Procedure diagnoses: Lumbar radiculopathy [M54.16]    Meds Administered:  Meds ordered this encounter  Medications   methylPREDNISolone acetate (DEPO-MEDROL) injection 80 mg    Laterality: Bilateral  Location/Site:  L4-L5  Needle:5.0 in., 22 ga.  Short bevel or Quincke spinal needle  Needle Placement: Transforaminal  Findings:    -Comments: Excellent flow of contrast along the nerve, nerve root and into the epidural space.  Procedure Details: After squaring off the end-plates to get a true AP view, the C-arm was positioned so that an oblique view of the foramen as noted above was visualized. The target area is just inferior to the "nose of the scotty dog" or sub pedicular. The soft tissues overlying this structure were infiltrated with 2-3 ml. of 1% Lidocaine without Epinephrine.  The spinal needle  was inserted toward the target using a "trajectory" view along the fluoroscope beam.  Under AP and lateral visualization, the needle was advanced so it did not puncture dura and was located close the 6 O'Clock position of the pedical in AP tracterory. Biplanar projections were used to confirm position. Aspiration was confirmed to be negative for CSF and/or blood. A 1-2 ml. volume of Isovue-250 was  injected and flow of contrast was noted at each level. Radiographs were obtained for documentation purposes.   After attaining the desired flow of contrast documented above, a 0.5 to 1.0 ml test dose of 0.25% Marcaine was injected into each respective transforaminal space.  The patient was observed for 90 seconds post injection.  After no sensory deficits were reported, and normal lower extremity motor function was noted,   the above injectate was administered so that equal amounts of the injectate were placed at each foramen (level) into the transforaminal epidural space.   Additional Comments:  The patient tolerated the procedure well Dressing: 2 x 2 sterile gauze and Band-Aid    Post-procedure details: Patient was observed during the procedure. Post-procedure instructions were reviewed.  Patient left the clinic in stable condition.    Clinical History: MRI LUMBAR SPINE WITHOUT CONTRAST   TECHNIQUE: Multiplanar, multisequence MR imaging of the lumbar spine was performed. No intravenous contrast was administered.   COMPARISON:  Lumbar radiographs 02/24/2018 and earlier. CT Abdomen and Pelvis 10/20/2010. chest CTA 04/03/2016.   FINDINGS: Segmentation: 12 pairs of ribs including the lowest which are hypoplastic demonstrated on the 2017 CTA. Subsequently there are 4 lumbar type vertebral bodies. Therefore, the L5 level is fully sacralized with a vestigial L5-S1 disc space. Correlation with radiographs is recommended prior to any operative intervention.   Alignment: Chronic grade 1 anterolisthesis of L3 on L4 measuring 3-4 millimeters appears increased since 2011. Stable vertebral height and alignment elsewhere with mild straightening of lumbar lordosis.   Vertebrae: No marrow edema or evidence of acute osseous abnormality. Visualized bone marrow signal is within normal limits. Intact visible sacrum and SI joints.   Conus medullaris and cauda equina: Conus extends to the L1  level. No lower spinal cord or conus signal abnormality.   Paraspinal and other soft tissues: Stable since 2011 and negative visible abdominal viscera. Negative visualized posterior paraspinal soft tissues.   Disc levels:   T10-T11: Negative.   T11-T12: Negative.   T12-L1: Mild disc bulging with cephalad extruded disc fragment occupying the left ventral epidural space posterior to the T12 vertebral body (series 8, image 8, series 11, image 4. The extruded disc fragment encompasses 4 x 10 x 10 millimeters (AP by transverse by CC). This is in proximity to the descending left T12 nerve roots in the lateral recess. No significant spinal or foraminal stenosis.   L1-L2: Minimal far lateral disc bulging and facet hypertrophy. No stenosis.   L2-L3: Mild far lateral disc bulging and endplate spurring. Moderate facet hypertrophy. No significant stenosis.   L3-L4: Grade 1 anterolisthesis. Disc desiccation and mild disc space loss with circumferential but mostly foraminal and far lateral disc bulging. Moderate to severe facet and ligament flavum hypertrophy. Mild spinal stenosis with mild bilateral lateral recess stenosis (L4 nerve levels), and mild to moderate bilateral L3 neural foraminal stenosis.   L4-L5: Vacuum disc. Circumferential disc bulge with broad-based foraminal involvement. Moderate facet and ligament flavum hypertrophy. No spinal or lateral recess stenosis. Mild to moderate bilateral L4 foraminal stenosis appears greater on the right.   L5-S1:  Sacralized and negative.   IMPRESSION: 1. 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 Correlation with radiographs is recommended prior to any operative intervention. 2. Grade 1 spondylolisthesis at L3-L4 is chronic but progressed since 2011. There is multifactorial mild spinal stenosis with mild lateral recess and up to moderate neural foraminal stenosis. Query right L3 and/or L4 radiculitis. 3. Up to  moderate multifactorial neural foraminal stenosis also at L4-L5; the exiting L4 nerve levels. 4. There is a small left-side extruded disc fragment posterior to the T12 vertebral body. No significant spinal stenosis, this could be a source for left T12 radiculitis. 5. Mild for age lumbar spine degeneration elsewhere.     Electronically Signed   By: Genevie Ann M.D.   On: 06/21/2018 09:11     Objective:  VS:  HT:    WT:   BMI:     BP:137/87  HR:92bpm  TEMP: ( )  RESP:  Physical Exam Vitals and nursing note reviewed.  Constitutional:      General: She is not in acute distress.    Appearance: Normal appearance. She is not ill-appearing.  HENT:     Head: Normocephalic and atraumatic.     Right Ear: External ear normal.     Left Ear: External ear normal.  Eyes:     Extraocular Movements: Extraocular movements intact.  Cardiovascular:     Rate and Rhythm: Normal rate.     Pulses: Normal pulses.  Pulmonary:     Effort: Pulmonary effort is normal. No respiratory distress.  Abdominal:     General: There is no distension.     Palpations: Abdomen is soft.  Musculoskeletal:        General: Tenderness present.     Cervical back: Neck supple.     Right lower leg: No edema.     Left lower leg: No edema.     Comments: Patient has good distal strength with no pain over the greater trochanters.  No clonus or focal weakness.  Skin:    Findings: No erythema, lesion or rash.  Neurological:     General: No focal deficit present.     Mental Status: She is alert and oriented to person, place, and time.     Sensory: No sensory deficit.     Motor: No weakness or abnormal muscle tone.     Coordination: Coordination normal.  Psychiatric:        Mood and Affect: Mood normal.        Behavior: Behavior normal.     Imaging: XR C-ARM NO REPORT  Result Date: 08/06/2021 Please see Notes tab for imaging impression.

## 2021-08-11 DIAGNOSIS — J3089 Other allergic rhinitis: Secondary | ICD-10-CM | POA: Diagnosis not present

## 2021-08-11 DIAGNOSIS — J301 Allergic rhinitis due to pollen: Secondary | ICD-10-CM | POA: Diagnosis not present

## 2021-08-19 DIAGNOSIS — J301 Allergic rhinitis due to pollen: Secondary | ICD-10-CM | POA: Diagnosis not present

## 2021-08-19 DIAGNOSIS — J3089 Other allergic rhinitis: Secondary | ICD-10-CM | POA: Diagnosis not present

## 2021-08-26 DIAGNOSIS — J301 Allergic rhinitis due to pollen: Secondary | ICD-10-CM | POA: Diagnosis not present

## 2021-08-26 DIAGNOSIS — J3089 Other allergic rhinitis: Secondary | ICD-10-CM | POA: Diagnosis not present

## 2021-09-02 DIAGNOSIS — J3089 Other allergic rhinitis: Secondary | ICD-10-CM | POA: Diagnosis not present

## 2021-09-02 DIAGNOSIS — J301 Allergic rhinitis due to pollen: Secondary | ICD-10-CM | POA: Diagnosis not present

## 2021-09-09 ENCOUNTER — Encounter: Payer: Self-pay | Admitting: Internal Medicine

## 2021-09-11 DIAGNOSIS — J3089 Other allergic rhinitis: Secondary | ICD-10-CM | POA: Diagnosis not present

## 2021-09-11 DIAGNOSIS — J301 Allergic rhinitis due to pollen: Secondary | ICD-10-CM | POA: Diagnosis not present

## 2021-09-12 DIAGNOSIS — M1711 Unilateral primary osteoarthritis, right knee: Secondary | ICD-10-CM | POA: Diagnosis not present

## 2021-09-18 DIAGNOSIS — J3089 Other allergic rhinitis: Secondary | ICD-10-CM | POA: Diagnosis not present

## 2021-09-18 DIAGNOSIS — J301 Allergic rhinitis due to pollen: Secondary | ICD-10-CM | POA: Diagnosis not present

## 2021-09-23 DIAGNOSIS — J3089 Other allergic rhinitis: Secondary | ICD-10-CM | POA: Diagnosis not present

## 2021-09-23 DIAGNOSIS — J301 Allergic rhinitis due to pollen: Secondary | ICD-10-CM | POA: Diagnosis not present

## 2021-10-01 DIAGNOSIS — J3089 Other allergic rhinitis: Secondary | ICD-10-CM | POA: Diagnosis not present

## 2021-10-01 DIAGNOSIS — J301 Allergic rhinitis due to pollen: Secondary | ICD-10-CM | POA: Diagnosis not present

## 2021-10-09 DIAGNOSIS — J301 Allergic rhinitis due to pollen: Secondary | ICD-10-CM | POA: Diagnosis not present

## 2021-10-09 DIAGNOSIS — J3089 Other allergic rhinitis: Secondary | ICD-10-CM | POA: Diagnosis not present

## 2021-10-14 ENCOUNTER — Other Ambulatory Visit: Payer: Self-pay

## 2021-10-14 ENCOUNTER — Encounter: Payer: Medicare HMO | Admitting: Adult Health

## 2021-10-14 ENCOUNTER — Ambulatory Visit (INDEPENDENT_AMBULATORY_CARE_PROVIDER_SITE_OTHER): Payer: Medicare HMO

## 2021-10-14 VITALS — Temp 97.0°F

## 2021-10-14 DIAGNOSIS — Z23 Encounter for immunization: Secondary | ICD-10-CM

## 2021-10-16 DIAGNOSIS — J301 Allergic rhinitis due to pollen: Secondary | ICD-10-CM | POA: Diagnosis not present

## 2021-10-16 DIAGNOSIS — J3089 Other allergic rhinitis: Secondary | ICD-10-CM | POA: Diagnosis not present

## 2021-10-27 DIAGNOSIS — J301 Allergic rhinitis due to pollen: Secondary | ICD-10-CM | POA: Diagnosis not present

## 2021-10-27 DIAGNOSIS — J4530 Mild persistent asthma, uncomplicated: Secondary | ICD-10-CM | POA: Diagnosis not present

## 2021-10-27 DIAGNOSIS — H1045 Other chronic allergic conjunctivitis: Secondary | ICD-10-CM | POA: Diagnosis not present

## 2021-10-27 DIAGNOSIS — J3089 Other allergic rhinitis: Secondary | ICD-10-CM | POA: Diagnosis not present

## 2021-11-05 DIAGNOSIS — J301 Allergic rhinitis due to pollen: Secondary | ICD-10-CM | POA: Diagnosis not present

## 2021-11-05 DIAGNOSIS — J3089 Other allergic rhinitis: Secondary | ICD-10-CM | POA: Diagnosis not present

## 2021-11-11 DIAGNOSIS — J3089 Other allergic rhinitis: Secondary | ICD-10-CM | POA: Diagnosis not present

## 2021-11-11 DIAGNOSIS — J301 Allergic rhinitis due to pollen: Secondary | ICD-10-CM | POA: Diagnosis not present

## 2021-11-17 DIAGNOSIS — J3089 Other allergic rhinitis: Secondary | ICD-10-CM | POA: Diagnosis not present

## 2021-11-17 DIAGNOSIS — J301 Allergic rhinitis due to pollen: Secondary | ICD-10-CM | POA: Diagnosis not present

## 2021-11-19 DIAGNOSIS — M1711 Unilateral primary osteoarthritis, right knee: Secondary | ICD-10-CM | POA: Diagnosis not present

## 2021-11-19 DIAGNOSIS — M25661 Stiffness of right knee, not elsewhere classified: Secondary | ICD-10-CM | POA: Diagnosis not present

## 2021-11-19 DIAGNOSIS — M25561 Pain in right knee: Secondary | ICD-10-CM | POA: Diagnosis not present

## 2021-11-25 DIAGNOSIS — J301 Allergic rhinitis due to pollen: Secondary | ICD-10-CM | POA: Diagnosis not present

## 2021-11-25 DIAGNOSIS — J3089 Other allergic rhinitis: Secondary | ICD-10-CM | POA: Diagnosis not present

## 2021-12-02 DIAGNOSIS — J3089 Other allergic rhinitis: Secondary | ICD-10-CM | POA: Diagnosis not present

## 2021-12-02 DIAGNOSIS — J301 Allergic rhinitis due to pollen: Secondary | ICD-10-CM | POA: Diagnosis not present

## 2021-12-06 NOTE — Progress Notes (Signed)
Assessment and Plan:  Lauren Lopez was seen today for pre-op exam.  Diagnoses and all orders for this visit:  Arthritis of right knee Planning TKR by Dr. Wynelle Link later this month  Preop clearance today Labs as ordered, EKG normal and low risk without concerning sx She is not taking ASA or other thinner Per patient ortho office has reviewed meds for her to hold Asthma remains well controlled, non-smoker Pending labs will fax over clearance to Dr. Wynelle Link, routine periop precautions  Preoperative testing -     CBC with Differential/Platelet -     COMPLETE METABOLIC PANEL WITH GFR -     Protime-INR -     Hemoglobin A1c -     Urinalysis, Routine w reflex microscopic -     EKG 12-Lead  Essential hypertension -     CBC with Differential/Platelet -     COMPLETE METABOLIC PANEL WITH GFR -     Urinalysis, Routine w reflex microscopic -     EKG 12-Lead  Type 2 diabetes mellitus with stage 2 chronic kidney disease, without long-term current use of insulin ([name]) -     COMPLETE METABOLIC PANEL WITH GFR -     Hemoglobin A1c  Abnormal bleeding time -     CBC with Differential/Platelet -     Protime-INR  Screening for cardiovascular condition -     EKG 12-Lead  Further disposition pending results of labs. Discussed med's effects and SE's.   Over 30 minutes of exam, counseling, chart review, and critical decision making was performed.   Future Appointments  Date Time Provider [place]  12/18/2021 10:00 AM [facility] PAT 1 [facility] None  01/01/2022  3:00 PM Liane Comber, NP [facility] None  07/07/2022 10:30 AM Liane Comber, NP [facility] None    ------------------------------------------------------------------------------------------------------------------   HPI BP 120/78    Pulse 78    Temp 97.7 F (36.5 C)    Wt 188 lb (85.3 kg)    SpO2 99%    BMI 33.04 kg/m  70 y.o.female presents for surgical clearance. She has has Essential hypertension; GERD (gastroesophageal reflux  disease); Vitamin D deficiency; Asthma; Medication management; Back pain; Osteoporosis; Obesity (BMI 30.0-34.9); Hyperlipidemia associated with type 2 diabetes mellitus ([place]); CKD stage 2 due to type 2 diabetes mellitus ([place]); Type 2 diabetes mellitus with stage 2 chronic kidney disease, without long-term current use of insulin ([place]); Lipoma of right upper extremity; and Allergic rhinitis due to pollen on their problem list.  Dr. Gaynelle Arabian is planning a R TKA on 12/29/2020 [facility] under spinal anesthesia.   Asthma is well controlled on advair, no flares in many years.   BMI is Body mass index is 33.04 kg/m. Wt Readings from Last 3 Encounters:  12/09/21 188 lb (85.3 kg)  07/07/21 193 lb (87.5 kg)  04/03/21 192 lb 6.4 oz (87.3 kg)   Her blood pressure has been controlled at home, today their BP is BP: 120/78  She does workout. She denies chest pain, shortness of breath, dizziness.   She has been working on diet and exercise for T2 diabetes, and denies hyperglycemia, hypoglycemia , nausea, paresthesia of the feet, polydipsia, polyuria, and visual disturbances. She reports fasting typically around 130s. Last A1C in the office was:  Lab Results  Component Value Date   HGBA1C 6.4 (H) 07/07/2021     Past Medical History:  Diagnosis Date   Anemia    Arthritis    "right hand, back" (04/03/2016)   Asthma  Bilateral carpal tunnel syndrome    Chronic lower back pain    GERD (gastroesophageal reflux disease)    Hyperlipidemia    Hypertension    Multiple allergies    Pneumonia 03/2012   Positive PPD    "they told me it wasn't positive; I was allergic to the test itself"   Right rotator cuff tear 01/20/2013   Type II diabetes mellitus ([place])    Vasculitis ([place]) 04/03/2016   Vitamin deficiency    Wears dentures    upper   Wears glasses      Allergies  Allergen Reactions   Metronidazole Other (See Comments)    unknown   Pneumovax 23 [Pneumococcal Vac Polyvalent]     Redness  and severe swelling at injection site   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 Dx E11.9   Cholecalciferol (VITAMIN D3) 5000 UNITS TABS Take 10,000 Units by mouth daily.   cyanocobalamin 100 MCG tablet Take 100 mcg by mouth daily.   cyclobenzaprine (FLEXERIL) 10 MG tablet TAKE 1 TABLET BY MOUTH AT BEDTIME AS NEEDED FOR MUSCLE SPASMS   fexofenadine (ALLEGRA) 180 MG tablet Take 1 tablet (180 mg total) by mouth daily.   fluticasone-salmeterol (ADVAIR HFA) 115-21 MCG/ACT inhaler Inhale 2 puffs into the lungs 2 (two) times daily.   hydrochlorothiazide (HYDRODIURIL) 25 MG tablet TAKE 1 TABLET BY MOUTH EVERY DAY   Lancets (ONETOUCH DELICA PLUS [id number]) MISC TEST SUGAR TWICE A DAY   MAGNESIUM PO Take 500 mg by mouth daily.    metFORMIN (GLUCOPHAGE) 500 MG tablet Take 1 tab twice daily with food for diabetes. Do not increase dose due to causing diarrhea.   Misc Natural Products (NEURIVA PO) Take by mouth daily.   omeprazole (PRILOSEC) 40 MG capsule Take one capsule daily for indigestion and heartburn   ONETOUCH ULTRA test strip TEST SUGAR TWICE A DAY   Potassium 99 MG TABS Take by mouth 2 (two) times daily.   Probiotic Product (PROBIOTIC-10 PO) Take by mouth daily.   rosuvastatin (CRESTOR) 20 MG tablet Take  1 tablet  Daily  for Cholesterol   terbinafine (LAMISIL) 250 MG tablet Take 1 tablet Daily for Toenail Fungus (Patient not taking: Reported on 12/09/2021)   No current facility-administered medications on file prior to visit.   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 Family History:  Herfamily history includes Alzheimer's disease in her father; Cirrhosis in her brother; Dementia in her mother; Diabetes in her mother; Hypertension in her brother, father, and mother; Tongue cancer in her sister. Social History:   reports that she has never smoked. She has never used smokeless tobacco. She reports that she does not drink alcohol and does not use drugs.   ROS: Review of  Systems  Constitutional:  Negative for malaise/fatigue and weight loss.  HENT:  Negative for hearing loss and tinnitus.   Eyes:  Negative for blurred vision and double vision.  Respiratory:  Negative for cough, shortness of breath and wheezing.   Cardiovascular:  Negative for chest pain, palpitations, orthopnea, claudication and leg swelling.  Gastrointestinal:  Negative for abdominal pain, blood in stool, constipation, diarrhea, heartburn, melena, nausea and vomiting.  Genitourinary: Negative.   Musculoskeletal:  Positive for joint pain (R knee). Negative for myalgias.  Skin:  Negative for rash.  Neurological:  Negative for dizziness, tingling, sensory change, weakness and headaches.  Endo/Heme/Allergies:  Negative for polydipsia.  Psychiatric/Behavioral: Negative.    All other systems reviewed and are negative.   Physical Exam:  BP 120/78    Pulse 78    Temp 97.7 F (36.5 C)    Wt 188 lb (85.3 kg)    SpO2 99%    BMI 33.04 kg/m   General Appearance: Well nourished, in no apparent distress. Eyes: PERRLA, conjunctiva no swelling or erythema ENT/Mouth: mask in place; Hearing normal.  Neck:  Supple, thyroid normal.  Respiratory: Respiratory effort normal, BS equal bilaterally without rales, rhonchi, wheezing or stridor.  Cardio: RRR with no MRGs. Brisk peripheral pulses without edema.  Abdomen: Soft, + BS.  Non tender, no guarding, rebound, hernias, masses. Lymphatics: Non tender without lymphadenopathy.  Musculoskeletal: R knee with mild bony enlargement, effusion without heat/redness; crepitus on ROM; mildly antalgic gait.  Skin: Warm, dry without rashes, lesions, ecchymosis.  Neuro: Normal muscle tone Psych: Awake and oriented X 3, normal affect, Insight and Judgment appropriate.     Izora Ribas, NP 11:36 AM [facility]

## 2021-12-09 ENCOUNTER — Ambulatory Visit (INDEPENDENT_AMBULATORY_CARE_PROVIDER_SITE_OTHER): Payer: Medicare HMO | Admitting: Adult Health

## 2021-12-09 ENCOUNTER — Encounter: Payer: Self-pay | Admitting: Adult Health

## 2021-12-09 ENCOUNTER — Other Ambulatory Visit: Payer: Self-pay

## 2021-12-09 VITALS — BP 120/78 | HR 78 | Temp 97.7°F | Wt 188.0 lb

## 2021-12-09 DIAGNOSIS — M1711 Unilateral primary osteoarthritis, right knee: Secondary | ICD-10-CM | POA: Diagnosis not present

## 2021-12-09 DIAGNOSIS — R791 Abnormal coagulation profile: Secondary | ICD-10-CM

## 2021-12-09 DIAGNOSIS — Z136 Encounter for screening for cardiovascular disorders: Secondary | ICD-10-CM

## 2021-12-09 DIAGNOSIS — I10 Essential (primary) hypertension: Secondary | ICD-10-CM

## 2021-12-09 DIAGNOSIS — E1122 Type 2 diabetes mellitus with diabetic chronic kidney disease: Secondary | ICD-10-CM

## 2021-12-09 DIAGNOSIS — N182 Chronic kidney disease, stage 2 (mild): Secondary | ICD-10-CM | POA: Diagnosis not present

## 2021-12-09 DIAGNOSIS — J301 Allergic rhinitis due to pollen: Secondary | ICD-10-CM | POA: Diagnosis not present

## 2021-12-09 DIAGNOSIS — Z01818 Encounter for other preprocedural examination: Secondary | ICD-10-CM

## 2021-12-09 DIAGNOSIS — J3089 Other allergic rhinitis: Secondary | ICD-10-CM | POA: Diagnosis not present

## 2021-12-10 LAB — CBC WITH DIFFERENTIAL/PLATELET
Absolute Monocytes: 270 cells/uL (ref 200–950)
Basophils Absolute: 11 cells/uL (ref 0–200)
Basophils Relative: 0.2 %
Eosinophils Absolute: 88 cells/uL (ref 15–500)
Eosinophils Relative: 1.6 %
HCT: 38.2 % (ref 35.0–45.0)
Hemoglobin: 11.9 g/dL (ref 11.7–15.5)
Lymphs Abs: 2475 cells/uL (ref 850–3900)
MCH: 27.4 pg (ref 27.0–33.0)
MCHC: 31.2 g/dL — ABNORMAL LOW (ref 32.0–36.0)
MCV: 88 fL (ref 80.0–100.0)
MPV: 12.2 fL (ref 7.5–12.5)
Monocytes Relative: 4.9 %
Neutro Abs: 2657 cells/uL (ref 1500–7800)
Neutrophils Relative %: 48.3 %
Platelets: 215 10*3/uL (ref 140–400)
RBC: 4.34 10*6/uL (ref 3.80–5.10)
RDW: 13.3 % (ref 11.0–15.0)
Total Lymphocyte: 45 %
WBC: 5.5 10*3/uL (ref 3.8–10.8)

## 2021-12-10 LAB — PROTIME-INR
INR: 1
Prothrombin Time: 10.2 s (ref 9.0–11.5)

## 2021-12-10 LAB — URINALYSIS, ROUTINE W REFLEX MICROSCOPIC
Bilirubin Urine: NEGATIVE
Glucose, UA: NEGATIVE
Hgb urine dipstick: NEGATIVE
Ketones, ur: NEGATIVE
Leukocytes,Ua: NEGATIVE
Nitrite: NEGATIVE
Protein, ur: NEGATIVE
Specific Gravity, Urine: 1.018 (ref 1.001–1.035)
pH: 7.5 (ref 5.0–8.0)

## 2021-12-10 LAB — COMPLETE METABOLIC PANEL WITH GFR
AG Ratio: 1.9 (calc) (ref 1.0–2.5)
ALT: 14 U/L (ref 6–29)
AST: 19 U/L (ref 10–35)
Albumin: 4.5 g/dL (ref 3.6–5.1)
Alkaline phosphatase (APISO): 111 U/L (ref 37–153)
BUN: 10 mg/dL (ref 7–25)
CO2: 28 mmol/L (ref 20–32)
Calcium: 9.8 mg/dL (ref 8.6–10.4)
Chloride: 103 mmol/L (ref 98–110)
Creat: 0.67 mg/dL (ref 0.60–1.00)
Globulin: 2.4 g/dL (calc) (ref 1.9–3.7)
Glucose, Bld: 121 mg/dL — ABNORMAL HIGH (ref 65–99)
Potassium: 3.9 mmol/L (ref 3.5–5.3)
Sodium: 140 mmol/L (ref 135–146)
Total Bilirubin: 0.6 mg/dL (ref 0.2–1.2)
Total Protein: 6.9 g/dL (ref 6.1–8.1)
eGFR: 94 mL/min/{1.73_m2} (ref >=60)

## 2021-12-10 LAB — HEMOGLOBIN A1C
Hgb A1c MFr Bld: 6.5 % of total Hgb — ABNORMAL HIGH (ref <5.7)
Mean Plasma Glucose: 140 mg/dL
eAG (mmol/L): 7.7 mmol/L

## 2021-12-15 DIAGNOSIS — J3089 Other allergic rhinitis: Secondary | ICD-10-CM | POA: Diagnosis not present

## 2021-12-15 DIAGNOSIS — J301 Allergic rhinitis due to pollen: Secondary | ICD-10-CM | POA: Diagnosis not present

## 2021-12-16 NOTE — Progress Notes (Signed)
Covid test on 12/25/21 at 0915am                Your procedure is scheduled on:   12/29/2021.   Report to [facility] Main  Entrance   Report to admitting at   1100AM     Call this number if you have problems the morning of surgery [phone]    REMEMBER: NO  SOLID FOOD CANDY OR GUM AFTER MIDNIGHT. CLEAR LIQUIDS UNTIL   1045am       . NOTHING BY MOUTH EXCEPT CLEAR LIQUIDS UNTIL  1045am   . PLEASE FINISH ENSURE DRINK PER SURGEON ORDER  WHICH NEEDS TO BE COMPLETED AT     1045am  .      CLEAR LIQUID DIET   Foods Allowed                                                                    Coffee and tea, regular and decaf                            Fruit ices (not with fruit pulp)                                      Iced Popsicles                                    Carbonated beverages, regular and diet                                    Cranberry, grape and apple juices Sports drinks like Gatorade Lightly seasoned clear broth or consume(fat free) Sugar, honey syrup ___________________________________________________________________      BRUSH YOUR TEETH MORNING OF SURGERY AND RINSE YOUR MOUTH OUT, NO CHEWING GUM CANDY OR MINTS.     Take these medicines the morning of surgery with A SIP OF WATER:  inhalers as usual and bring, allegra, magnesium, omeprazole, potassium   DO NOT TAKE ANY DIABETIC MEDICATIONS DAY OF YOUR SURGERY                               You may not have any metal on your body including hair pins and              piercings  Do not wear jewelry, make-up, lotions, powders or perfumes, deodorant             Do not wear nail polish on your fingernails.  Do not shave  48 hours prior to surgery.              Men may shave face and neck.   Do not bring valuables to the hospital. [place].  Contacts, dentures or bridgework may not be worn into surgery.  Leave suitcase in the car. After  surgery it may be  brought to your room.     Patients discharged the day of surgery will not be allowed to drive home. IF YOU ARE HAVING SURGERY AND GOING HOME THE SAME DAY, YOU MUST HAVE AN ADULT TO DRIVE YOU HOME AND BE WITH YOU FOR 24 HOURS. YOU MAY GO HOME BY TAXI OR UBER OR ORTHERWISE, BUT AN ADULT MUST ACCOMPANY YOU HOME AND STAY WITH YOU FOR 24 HOURS.  Name and phone number of your driver:  Special Instructions: N/A              Please read over the following fact sheets you were given: _____________________________________________________________________  [facility] - Preparing for Surgery Before surgery, you can play an important role.  Because skin is not sterile, your skin needs to be as free of germs as possible.  You can reduce the number of germs on your skin by washing with CHG (chlorahexidine gluconate) soap before surgery.  CHG is an antiseptic cleaner which kills germs and bonds with the skin to continue killing germs even after washing. Please DO NOT use if you have an allergy to CHG or antibacterial soaps.  If your skin becomes reddened/irritated stop using the CHG and inform your nurse when you arrive at Short Stay. Do not shave (including legs and underarms) for at least 48 hours prior to the first CHG shower.  You may shave your face/neck. Please follow these instructions carefully:  1.  Shower with CHG Soap the night before surgery and the  morning of Surgery.  2.  If you choose to wash your hair, wash your hair first as usual with your  normal  shampoo.  3.  After you shampoo, rinse your hair and body thoroughly to remove the  shampoo.                           4.  Use CHG as you would any other liquid soap.  You can apply chg directly  to the skin and wash                       Gently with a scrungie or clean washcloth.  5.  Apply the CHG Soap to your body ONLY FROM THE NECK DOWN.   Do not use on face/ open                           Wound or open sores. Avoid contact with eyes, ears mouth  and genitals (private parts).                       Wash face,  Genitals (private parts) with your normal soap.             6.  Wash thoroughly, paying special attention to the area where your surgery  will be performed.  7.  Thoroughly rinse your body with warm water from the neck down.  8.  DO NOT shower/wash with your normal soap after using and rinsing off  the CHG Soap.                9.  Pat yourself dry with a clean towel.            10.  Wear clean pajamas.            11.  Place clean sheets on your bed  the night of your first shower and do not  sleep with pets. Day of Surgery : Do not apply any lotions/deodorants the morning of surgery.  Please wear clean clothes to the hospital/surgery center.  FAILURE TO FOLLOW THESE INSTRUCTIONS MAY RESULT IN THE CANCELLATION OF YOUR SURGERY PATIENT SIGNATURE_________________________________  NURSE SIGNATURE__________________________________  ________________________________________________________________________

## 2021-12-16 NOTE — Progress Notes (Addendum)
Anesthesia Review:  PCP: 12/09/21- ov with Caryl Pina corbett,NP - CLEARANCE ON CHART  Cardiologist : none  Chest x-ray : EKG : 12/09/21 - ON CHART  Echo : 2017  Stress test: Cardiac Cath :  Activity level: can do a flight of stairs without difficulty  Sleep Study/ CPAP : none  Fasting Blood Sugar :      / Checks Blood Sugar -- times a day:   Blood Thinner/ Instructions /Last Dose: ASA / Instructions/ Last Dose :   U/a CBC/diff, cmp and hgba1c- 12/09/21- epic.  Hgba1c-12/09/21-6.5  Covid test on 12/25/21 at 0915am.   DM- type 2  Checks glucose two times daily

## 2021-12-18 ENCOUNTER — Encounter (HOSPITAL_COMMUNITY)
Admission: RE | Admit: 2021-12-18 | Discharge: 2021-12-18 | Disposition: A | Discharge status: Home / Self Care | Payer: Medicare HMO | Source: Ambulatory Visit | Attending: Orthopedic Surgery | Admitting: Orthopedic Surgery

## 2021-12-18 ENCOUNTER — Other Ambulatory Visit: Payer: Self-pay

## 2021-12-18 ENCOUNTER — Encounter (HOSPITAL_COMMUNITY): Payer: Self-pay

## 2021-12-18 VITALS — BP 122/97 | HR 70 | Temp 97.9°F | Resp 16 | Ht 62.0 in | Wt 183.0 lb

## 2021-12-18 DIAGNOSIS — Z01812 Encounter for preprocedural laboratory examination: Secondary | ICD-10-CM | POA: Insufficient documentation

## 2021-12-18 DIAGNOSIS — Z01818 Encounter for other preprocedural examination: Secondary | ICD-10-CM

## 2021-12-18 DIAGNOSIS — M1711 Unilateral primary osteoarthritis, right knee: Secondary | ICD-10-CM | POA: Diagnosis not present

## 2021-12-18 LAB — GLUCOSE, CAPILLARY: Glucose-Capillary: 109 mg/dL — ABNORMAL HIGH (ref 70–99)

## 2021-12-18 LAB — SURGICAL PCR SCREEN
MRSA, PCR: NEGATIVE
Staphylococcus aureus: NEGATIVE

## 2021-12-23 DIAGNOSIS — J3089 Other allergic rhinitis: Secondary | ICD-10-CM | POA: Diagnosis not present

## 2021-12-23 DIAGNOSIS — J301 Allergic rhinitis due to pollen: Secondary | ICD-10-CM | POA: Diagnosis not present

## 2021-12-24 NOTE — H&P (Addendum)
TOTAL KNEE ADMISSION H&P  Patient is being admitted for right total knee arthroplasty.  Subjective:  Chief Complaint: Right knee pain.  HPI: Lauren Lopez, 71 y.o. female has a history of pain and functional disability in the right knee due to arthritis and has failed non-surgical conservative treatments for greater than 12 weeks to include NSAID's and/or analgesics, flexibility and strengthening excercises, and activity modification. Onset of symptoms was gradual, starting  several  years ago with gradually worsening course since that time. The patient noted no past surgery on the right knee.  Patient currently rates pain in the right knee at 7 out of 10 with activity. Patient has worsening of pain with activity and weight bearing, pain that interferes with activities of daily living, pain with passive range of motion, and crepitus. Patient has evidence of periarticular osteophytes and joint space narrowing by imaging studies. There is no active infection.  Patient Active Problem List   Diagnosis Date Noted   Allergic rhinitis due to pollen 04/03/2021   Lipoma of right upper extremity 05/01/2020   Hyperlipidemia associated with type 2 diabetes mellitus ([place]) 04/30/2020   CKD stage 2 due to type 2 diabetes mellitus ([place]) 04/30/2020   Type 2 diabetes mellitus with stage 2 chronic kidney disease, without long-term current use of insulin ([place]) 04/30/2020   Obesity (BMI 30.0-34.9) 05/30/2018   Osteoporosis 06/01/2017   Back pain 04/05/2016   Medication management 01/29/2015   GERD (gastroesophageal reflux disease)    Vitamin D deficiency    Asthma    Essential hypertension 08/09/2009    Past Medical History:  Diagnosis Date   Arthritis    "right hand, back" (04/03/2016)   Asthma    Bilateral carpal tunnel syndrome    Chronic lower back pain    GERD (gastroesophageal reflux disease)    Hyperlipidemia    Hypertension    Multiple allergies    Pneumonia 03/2012   Positive PPD     "they told me it wasn't positive; I was allergic to the test itself"   Right rotator cuff tear 01/20/2013   Type II diabetes mellitus ([place])    Vasculitis ([place]) 04/03/2016   Vitamin deficiency    Wears dentures    upper   Wears glasses     Past Surgical History:  Procedure Laterality Date   ABDOMINAL HYSTERECTOMY     BILATERAL SALPINGOOPHORECTOMY  11/28/2010   open laparoscopy with adhesiolysis also   CARPAL TUNNEL RELEASE  07/15/2012   Procedure: CARPAL TUNNEL RELEASE;  Surgeon: Cammie Sickle., MD;  Location: [place];  Service: Orthopedics;  Laterality: Left;   CARPAL TUNNEL RELEASE Right 05/31/2014   Procedure: RIGHT CARPAL TUNNEL RELEASE;  Surgeon: Cammie Sickle, MD;  Location: [place];  Service: Orthopedics;  Laterality: Right;   COLONOSCOPY     KNEE ARTHROSCOPY Bilateral 2005-2016   "right-left"   SHOULDER ARTHROSCOPY WITH ROTATOR CUFF REPAIR AND SUBACROMIAL DECOMPRESSION Right 01/20/2013   Procedure: RIGHT ARTHROSCOPY SHOULDER DEBRIDEMENT LIMITED, ARTHROSCOPY SHOULDER DECOMPRESSION SUBACROMIAL PARTIAL ACROMIOPLASTY WITH CORACOACROMIAL RELEASE, ROTATOR CUFF REPAIR ;  Surgeon: Johnny Bridge, MD;  Location: [place];  Service: Orthopedics;  Laterality: Right;  RIGHT SHOULDER SCOPE DEBRIDEMENT, ACRIOMIOPLASTY, ROTATOR CUFF REPAIR   TUBAL LIGATION      Prior to Admission medications   Medication Sig Start Date End Date Taking? Authorizing Provider  ADVAIR HFA 230-21 MCG/ACT inhaler Inhale 2 puffs into the lungs 2 (two) times daily. 07/28/21  Yes  [provider]  Cholecalciferol (VITAMIN D3) 5000 UNITS TABS Take 10,000 Units by mouth daily.   Yes [provider]  cyanocobalamin 1000 MCG tablet Take 1,000 mcg by mouth daily.   Yes [provider]  cyclobenzaprine (FLEXERIL) 10 MG tablet TAKE 1 TABLET BY MOUTH AT BEDTIME AS NEEDED FOR MUSCLE SPASMS 05/01/21  Yes Unk Pinto, MD  docusate sodium  (COLACE) 100 MG capsule Take 100 mg by mouth daily.   Yes [provider]  fexofenadine (ALLEGRA) 180 MG tablet Take 1 tablet (180 mg total) by mouth daily. 10/17/15  Yes Rolene Course, PA-C  hydrochlorothiazide (HYDRODIURIL) 25 MG tablet TAKE 1 TABLET BY MOUTH EVERY DAY 02/06/21  Yes Liane Comber, NP  latanoprost (XALATAN) 0.005 % ophthalmic solution Place 1 drop into both eyes at bedtime. 11/26/21  Yes [provider]  MAGNESIUM PO Take 650 mg by mouth daily.   Yes [provider]  meloxicam (MOBIC) 15 MG tablet Take 15 mg by mouth daily as needed for pain.   Yes [provider]  metFORMIN (GLUCOPHAGE) 500 MG tablet Take 1 tab twice daily with food for diabetes. Do not increase dose due to causing diarrhea. 07/07/21  Yes Liane Comber, NP  mometasone (NASONEX) 50 MCG/ACT nasal spray Place 2 sprays into the nose daily as needed (allergies).   Yes [provider]  omeprazole (PRILOSEC) 40 MG capsule Take one capsule daily for indigestion and heartburn 07/31/21  Yes Liane Comber, NP  OVER THE COUNTER MEDICATION Take 1 capsule by mouth daily. Neuriva   Yes [provider]  Potassium 99 MG TABS Take 2 tablets by mouth daily.   Yes [provider]  Probiotic Product (PROBIOTIC-10 PO) Take 1 capsule by mouth daily.   Yes [provider]  Rhubarb (ESTROVEN MENOPAUSE RELIEF PO) Take 1 capsule by mouth daily.   Yes [provider]  rosuvastatin (CRESTOR) 20 MG tablet Take  1 tablet  Daily  for Cholesterol 07/08/21  Yes Liane Comber, NP  blood glucose meter kit and supplies Test sugars twice daily. Dx E11.9 01/24/19   Vladimir Crofts, PA-C  Lancets (ONETOUCH DELICA PLUS [id number]) [place] TEST SUGAR TWICE A DAY 07/25/19   Unk Pinto, MD  [facility] ULTRA test strip TEST SUGAR TWICE A DAY 02/12/20   Liane Comber, NP    Allergies  Allergen Reactions   Metronidazole Other (See Comments)    unknown   Ppd [Tuberculin  Purified Protein Derivative] Other (See Comments)    + PPD 2013 with NEG CXR 05/2012 Redness and severe swelling at injection site    Social History   Socioeconomic History   Marital status: Married    Spouse name: Not on file   Number of children: Not on file   Years of education: Not on file   Highest education level: Not on file  Occupational History   Not on file  Tobacco Use   Smoking status: Never   Smokeless tobacco: Never  Vaping Use   Vaping Use: Never used  Substance and Sexual Activity   Alcohol use: No   Drug use: No   Sexual activity: Not Currently    Birth control/protection: Post-menopausal  Other Topics Concern   Not on file  Social History Narrative   Not on file   Social Determinants of Health   Financial Resource Strain: Not on file  Food Insecurity: Not on file  Transportation Needs: Not on file  Physical Activity: Not on file  Stress: Not on  file  Social Connections: Not on file  Intimate Partner Violence: Not on file    Tobacco Use: Low Risk    Smoking Tobacco Use: Never   Smokeless Tobacco Use: Never   Passive Exposure: Not on file   Social History   Substance and Sexual Activity  Alcohol Use No    Family History  Problem Relation Age of Onset   Hypertension Mother    Diabetes Mother    Dementia Mother    Hypertension Father    Alzheimer's disease Father    Tongue cancer Sister        smoker   Cirrhosis Brother    Hypertension Brother     ROS: Constitutional: no fever, no chills, no night sweats, no significant weight loss Cardiovascular: no chest pain, no palpitations Respiratory: no cough, no shortness of breath, No COPD Gastrointestinal: no vomiting, no nausea Musculoskeletal: no swelling in Joints, Joint Pain Neurologic: no numbness, no tingling, no difficulty with balance   Objective:  Physical Exam: Well nourished and well developed.  General: Alert and oriented x3, cooperative and pleasant, no acute distress.   Head: normocephalic, atraumatic, neck supple.  Eyes: EOMI.  Respiratory: breath sounds clear in all fields, no wheezing, rales, or rhonchi. Cardiovascular: Regular rate and rhythm, no murmurs, gallops or rubs.  Abdomen: non-tender to palpation and soft, normoactive bowel sounds. Musculoskeletal:  Right Knee Exam:   No effusion present. No swelling present.   The range of motion is: 5 to 125 degrees.   Moderate crepitus on range of motion of the knee.   Positive medial joint line tenderness.   No lateral joint line tenderness.   The knee is stable.   She has a large collection of fatty tissue on the medial aspect of the knee, but it does not appear to be a discrete lipoma.  Calves soft and nontender. Motor function intact in LE. Strength 5/5 LE bilaterally. Neuro: Distal pulses 2+. Sensation to light touch intact in LE.    Vital signs in last 24 hours:    Imaging Review Radiographs- AP and lateral of the right knee dated 08/03/2021 demonstrate bone-on-bone arthritis in the medial and patellofemoral compartments.   Assessment/Plan:  End stage arthritis, right knee   The patient history, physical examination, clinical judgment of the provider and imaging studies are consistent with end stage degenerative joint disease of the right knee and total knee arthroplasty is deemed medically necessary. The treatment options including medical management, injection therapy arthroscopy and arthroplasty were discussed at length. The risks and benefits of total knee arthroplasty were presented and reviewed. The risks due to aseptic loosening, infection, stiffness, patella tracking problems, thromboembolic complications and other imponderables were discussed. The patient acknowledged the explanation, agreed to proceed with the plan and consent was signed. Patient is being admitted for inpatient treatment for surgery, pain control, PT, OT, prophylactic antibiotics, VTE prophylaxis, progressive  ambulation and ADLs and discharge planning. The patient is planning to be discharged  home .   Patient's anticipated LOS is less than 2 midnights, meeting these requirements: - Younger than 73 - Lives within 1 hour of care - Has a competent adult at home to recover with post-op recover - NO history of  - Chronic pain requiring opiods  - Diabetes  - Coronary Artery Disease  - Heart failure  - Heart attack  - Stroke  - DVT/VTE  - Cardiac arrhythmia  - Respiratory Failure/COPD  - Renal failure  - Anemia  - Advanced Liver disease  Therapy Plans: [facility] Disposition: Home with Daughter and Husband Planned DVT Prophylaxis: Xarelto 22m (Intolerance to Aspirin) DME Needed: [facility]: [facility] (clear[facility] received) TXA: IV Allergies: Flagyl (tongue swelling) Anesthesia Concerns: None BMI: 34.4 Last HgbA1c: 6.5  [facility] [address] - Patient was instructed on what medications to stop prior to surgery. - Follow-up visit in 2 weeks with Dr. AWynelle Link- Begin physical therapy following surgery - Pre-operative lab work as pre-surgical testing - Prescriptions will be provided in hospital at time of discharge  SFenton Foy [facility] PA-C Orthopedic Surgery [facility] [place]

## 2021-12-25 ENCOUNTER — Other Ambulatory Visit: Payer: Self-pay | Admitting: Adult Health

## 2021-12-25 ENCOUNTER — Encounter (HOSPITAL_COMMUNITY)
Admission: RE | Admit: 2021-12-25 | Discharge: 2021-12-25 | Disposition: A | Discharge status: Home / Self Care | Payer: Medicare HMO | Source: Ambulatory Visit | Attending: Orthopedic Surgery | Admitting: Orthopedic Surgery

## 2021-12-25 ENCOUNTER — Other Ambulatory Visit: Payer: Self-pay

## 2021-12-25 DIAGNOSIS — U071 COVID-19: Secondary | ICD-10-CM | POA: Insufficient documentation

## 2021-12-25 DIAGNOSIS — Z01812 Encounter for preprocedural laboratory examination: Secondary | ICD-10-CM | POA: Insufficient documentation

## 2021-12-25 LAB — SARS CORONAVIRUS 2 (TAT 6-24 HRS): SARS Coronavirus 2: POSITIVE — AB

## 2021-12-29 ENCOUNTER — Encounter: Payer: Self-pay | Admitting: Nurse Practitioner

## 2021-12-29 ENCOUNTER — Other Ambulatory Visit: Payer: Self-pay | Admitting: Adult Health

## 2021-12-29 ENCOUNTER — Other Ambulatory Visit: Payer: Self-pay

## 2021-12-29 ENCOUNTER — Ambulatory Visit: Payer: Medicare HMO | Admitting: Nurse Practitioner

## 2021-12-29 VITALS — Temp 97.7°F

## 2021-12-29 DIAGNOSIS — U071 COVID-19: Secondary | ICD-10-CM | POA: Diagnosis not present

## 2021-12-29 DIAGNOSIS — M1711 Unilateral primary osteoarthritis, right knee: Secondary | ICD-10-CM

## 2021-12-29 MED ORDER — DEXAMETHASONE 1 MG PO TABS: ORAL_TABLET | ORAL | 0 refills | Status: DC

## 2021-12-29 MED ORDER — AZITHROMYCIN 250 MG PO TABS: ORAL_TABLET | ORAL | 1 refills | Status: DC

## 2021-12-29 MED ORDER — PROMETHAZINE-DM 6.25-15 MG/5ML PO SYRP: 5.0000 mL | ORAL_SOLUTION | Freq: Four times a day (QID) | ORAL | 1 refills | Status: DC | PRN

## 2021-12-29 MED ORDER — MOLNUPIRAVIR EUA 200MG CAPSULE: 4.0000 | ORAL_CAPSULE | Freq: Two times a day (BID) | ORAL | 0 refills | Status: AC

## 2021-12-29 NOTE — Progress Notes (Signed)
THIS ENCOUNTER IS A VIRTUAL VISIT DUE TO COVID-19 - PATIENT WAS NOT SEEN IN THE OFFICE.  PATIENT HAS CONSENTED TO VIRTUAL VISIT / TELEMEDICINE VISIT   Virtual Visit via telephone Note  I connected with  Lauren Lopez on 12/29/2021 by telephone.  I verified that I am speaking with the correct person using two identifiers.    I discussed the limitations of evaluation and management by telemedicine and the availability of in person appointments. The patient expressed understanding and agreed to proceed.  History of Present Illness:  Temp 97.7 F (36.5 C)  71 y.o. patient contacted office reporting URI sx . she tested positive on 12/25/21 at preop visit. . OV was conducted by telephone to minimize exposure. This patient was vaccinated for covid 19, last 08/2021 Immunization History  Administered Date(s) Administered   Influenza, High Dose Seasonal PF 11/10/2017, 09/15/2018, 09/04/2019, 04/26/2020, 10/14/2021   Influenza, Seasonal, Injecte, Preservative Fre 10/17/2015   Influenza,inj,quad, With Preservative 10/15/2016   Influenza-Unspecified 08/30/2020   PFIZER(Purple Top)SARS-COV-2 Vaccination 12/22/2019, 01/12/2020, 04/26/2020, 04/01/2021   Pneumococcal Conjugate-13 12/22/2018   Pneumococcal Polysaccharide-23 02/04/2017, 04/25/2021   Pneumococcal-Unspecified 06/27/2013   Tdap 04/23/2012   Zoster, Live 07/31/2015     Sx began 1 days ago with congestion, productive cough of yellow mucus, muscle aches, headache, fatigue  Treatments tried so far: Mucinex, Albuterol  Exposures: [place]  Has knee replacement rescheduled 02/02/22  Medications  Current Outpatient Medications (Endocrine & Metabolic):    metFORMIN (GLUCOPHAGE) 500 MG tablet, Take 1 tab twice daily with food for diabetes. Do not increase dose due to causing diarrhea.  Current Outpatient Medications (Cardiovascular):    hydrochlorothiazide (HYDRODIURIL) 25 MG tablet, TAKE 1 TABLET BY MOUTH EVERY DAY   rosuvastatin  (CRESTOR) 20 MG tablet, Take  1 tablet  Daily  for Cholesterol  Current Outpatient Medications (Respiratory):    ADVAIR HFA 287-68 MCG/ACT inhaler, Inhale 2 puffs into the lungs 2 (two) times daily.   fexofenadine (ALLEGRA) 180 MG tablet, Take 1 tablet (180 mg total) by mouth daily.   mometasone (NASONEX) 50 MCG/ACT nasal spray, Place 2 sprays into the nose daily as needed (allergies).  Current Outpatient Medications (Analgesics):    meloxicam (MOBIC) 15 MG tablet, Take 15 mg by mouth daily as needed for pain.  Current Outpatient Medications (Hematological):    cyanocobalamin 1000 MCG tablet, Take 1,000 mcg by mouth daily.  Current Outpatient Medications (Other):    Cholecalciferol (VITAMIN D3) 5000 UNITS TABS, Take 10,000 Units by mouth daily.   cyclobenzaprine (FLEXERIL) 10 MG tablet, TAKE 1 TABLET BY MOUTH AT BEDTIME AS NEEDED FOR MUSCLE SPASMS   docusate sodium (COLACE) 100 MG capsule, Take 100 mg by mouth daily.   latanoprost (XALATAN) 0.005 % ophthalmic solution, Place 1 drop into both eyes at bedtime.   MAGNESIUM PO, Take 650 mg by mouth daily.   omeprazole (PRILOSEC) 40 MG capsule, Take one capsule daily for indigestion and heartburn   Potassium 99 MG TABS, Take 2 tablets by mouth daily.   Probiotic Product (PROBIOTIC-10 PO), Take 1 capsule by mouth daily.   Rhubarb (ESTROVEN MENOPAUSE RELIEF PO), Take 1 capsule by mouth daily.   blood glucose meter kit and supplies, Test sugars twice daily. Dx E11.9   Lancets (ONETOUCH DELICA PLUS [id number]) MISC, TEST SUGAR TWICE A DAY   ONETOUCH ULTRA test strip, USE TO TEST SUGAR TWICE A DAY   OVER THE COUNTER MEDICATION, Take 1 capsule by mouth daily. 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   Social History:   reports that she has never smoked. She has never used smokeless tobacco. She reports that she does not drink alcohol and does not use drugs.  Observations/Objective:  General : Well sounding patient in no apparent distress HEENT: no hoarseness, no cough for duration of visit Lungs: speaks in complete sentences, no audible wheezing, no apparent distress Neurological: alert, oriented x 3 Psychiatric: pleasant, judgement appropriate   Assessment and Plan:  Lauren Lopez was seen today for acute visit.  Diagnoses and all orders for this visit:  COVID -     molnupiravir EUA (LAGEVRIO) 200 mg CAPS capsule; Take 4 capsules (800 mg total) by mouth 2 (two) times daily for 5 days. -     azithromycin (ZITHROMAX) 250 MG tablet; Take 2 tablets (500 mg) on  Day 1,  followed by 1 tablet (250 mg) once daily on Days 2 through 5. -     dexamethasone (DECADRON) 1 MG tablet; Take 3 tabs for 3 days, 2 tabs for 3 days 1 tab for 5 days. Take with food. -     promethazine-dextromethorphan (PROMETHAZINE-DM) 6.25-15 MG/5ML syrup; Take 5 mLs by mouth 4 (four) times daily as needed for cough.    Covid 19 positive per rapid screening test in hospital preop 3 days ago Risk factors include: hypertension, obesity , diabetes Symptoms are: mild Due to co morbid conditions and risk factors, discussed antivirals  Immue support reviewed  Take tylenol PRN temp 101+ Push hydration Regular ambulation or calf exercises exercises for clot  prevention and 81 mg ASA unless contraindicated Sx supportive therapy suggested Follow up via mychart or telephone if needed Advised patient obtain O2 monitor; present to ED if persistently <90% or with severe dyspnea, CP, fever uncontrolled by tylenol, confusion, sudden decline Should remain in isolation until at least 5 days from onset of sx, 24-48 hours fever free without tylenol, sx such as cough are improved.      Follow Up Instructions:  I discussed the assessment and treatment plan with the patient. The patient was provided an opportunity to ask questions and all were answered. The patient agreed with the plan and demonstrated an understanding of the instructions.   The patient was advised to call back or seek an in-person evaluation if the symptoms worsen or if the condition fails to improve as anticipated.  I provided 20 minutes of non-face-to-face time during this encounter.   Magda Bernheim, NP

## 2021-12-31 ENCOUNTER — Other Ambulatory Visit: Payer: Self-pay

## 2021-12-31 DIAGNOSIS — E1122 Type 2 diabetes mellitus with diabetic chronic kidney disease: Secondary | ICD-10-CM

## 2021-12-31 MED ORDER — ONETOUCH ULTRA VI STRP: ORAL_STRIP | 3 refills | Status: DC

## 2022-01-01 ENCOUNTER — Encounter: Payer: Medicare HMO | Admitting: Adult Health

## 2022-01-06 DIAGNOSIS — J3089 Other allergic rhinitis: Secondary | ICD-10-CM | POA: Diagnosis not present

## 2022-01-06 DIAGNOSIS — J301 Allergic rhinitis due to pollen: Secondary | ICD-10-CM | POA: Diagnosis not present

## 2022-01-13 DIAGNOSIS — J301 Allergic rhinitis due to pollen: Secondary | ICD-10-CM | POA: Diagnosis not present

## 2022-01-13 DIAGNOSIS — J3081 Allergic rhinitis due to animal (cat) (dog) hair and dander: Secondary | ICD-10-CM | POA: Diagnosis not present

## 2022-01-13 DIAGNOSIS — J3089 Other allergic rhinitis: Secondary | ICD-10-CM | POA: Diagnosis not present

## 2022-01-21 ENCOUNTER — Other Ambulatory Visit: Payer: Self-pay | Admitting: Adult Health

## 2022-01-22 NOTE — Progress Notes (Addendum)
COVID swab appointment: n/a COVID + 12/25/21 in [place] Vaccine Completed: yes x4 Date COVID Vaccine completed: 12/22/19, 01/12/20 Has received booster: 04/26/20, 04/01/21 COVID vaccine manufacturer: [employer]      Date of COVID positive in last 90 days: yes 12/25/21 in [place]  PCP - Unk Pinto, MD Cardiologist - n/a  Medical Clearance by Liane Comber 12/09/21 in Epic   Chest x-ray - n/a EKG - 12/09/21 Epic Stress Test - many years ago per pt ECHO - 04/04/16 Epic Cardiac Cath - n/a Pacemaker/ICD device last checked: n/a Spinal Cord Stimulator: n/a  Bowel Prep - no  Sleep Study - n/a CPAP -   Fasting Blood Sugar - 110-160 Checks Blood Sugar _1_ times a day  Blood Thinner Instructions:n/a Aspirin Instructions: Last Dose:  Activity level: Can go up a flight of stairs and perform activities of daily living without stopping and without symptoms of chest pain or shortness of breath.       Anesthesia review: HTN, DM 2, CKD  Patient denies shortness of breath, fever, cough and chest pain at PAT appointment   Patient verbalized understanding of instructions that were given to them at the PAT appointment. Patient was also instructed that they will need to review over the PAT instructions again at home before surgery.

## 2022-01-22 NOTE — Patient Instructions (Addendum)
DUE TO COVID-19 ONLY ONE VISITOR IS ALLOWED TO COME WITH YOU AND STAY IN THE WAITING ROOM ONLY DURING PRE OP AND PROCEDURE.   **NO VISITORS ARE ALLOWED IN THE SHORT STAY AREA OR RECOVERY ROOM!!**  IF YOU WILL BE ADMITTED INTO THE HOSPITAL YOU ARE ALLOWED ONLY TWO SUPPORT PEOPLE DURING VISITATION HOURS ONLY (7 AM -8PM)   The support person(s) must pass our screening, gel in and out, and wear a mask at all times, including in the patients room. Patients must also wear a mask when staff or their support person are in the room. Visitors GUEST BADGE MUST BE WORN VISIBLY  One adult visitor may remain with you overnight and MUST be in the room by 8 P.M.  No visitors under the age of 15. Any visitor under the age of 25 must be accompanied by an adult.        Your procedure is scheduled on: 02/02/22   Report to [facility] Main Entrance    Report to admitting at 10:00 AM   Call this number if you have problems the morning of surgery [phone]   Do not eat food :After Midnight.   After Midnight you may have the following liquids until 9:40 AM DAY OF SURGERY  Water Black Coffee (sugar ok, NO MILK/CREAM OR CREAMERS)  Tea (sugar ok, NO MILK/CREAM OR CREAMERS) regular and decaf                             Plain Jell-O (NO RED)                                           Fruit ices (not with fruit pulp, NO RED)                                     Popsicles (NO RED)                                                                  Juice: apple, WHITE grape, WHITE cranberry Sports drinks like Gatorade (NO RED) Clear broth(vegetable,chicken,beef)    The day of surgery:  Drink ONE (1) Pre-Surgery G2 at 9:40 AM the morning of surgery. Drink in one sitting. Do not sip.  This drink was given to you during your hospital  pre-op appointment visit. Nothing else to drink after completing the  Pre-Surgery G2.          If you have questions, please contact your surgeons office.   FOLLOW  BOWEL PREP AND ANY ADDITIONAL PRE OP INSTRUCTIONS YOU RECEIVED FROM YOUR SURGEON'S OFFICE!!!     Oral Hygiene is also important to reduce your risk of infection.                                    Remember - BRUSH YOUR TEETH THE MORNING OF SURGERY WITH YOUR REGULAR TOOTHPASTE   Stop all vitamins and supplements 7 days before surgery.  Take these medicines the morning of surgery with A SIP OF WATER: Inhalers, Omeprazole, Rosuvastatin   DO NOT TAKE ANY ORAL DIABETIC MEDICATIONS DAY OF YOUR SURGERY  How to Manage Your Diabetes Before and After Surgery  Why is it important to control my blood sugar before and after surgery? Improving blood sugar levels before and after surgery helps healing and can limit problems. A way of improving blood sugar control is eating a healthy diet by:  Eating less sugar and carbohydrates  Increasing activity/exercise  Talking with your doctor about reaching your blood sugar goals High blood sugars (greater than 180 mg/dL) can raise your risk of infections and slow your recovery, so you will need to focus on controlling your diabetes during the weeks before surgery. Make sure that the doctor who takes care of your diabetes knows about your planned surgery including the date and location.  How do I manage my blood sugar before surgery? Check your blood sugar at least 4 times a day, starting 2 days before surgery, to make sure that the level is not too high or low. Check your blood sugar the morning of your surgery when you wake up and every 2 hours until you get to the Short Stay unit. If your blood sugar is less than 70 mg/dL, you will need to treat for low blood sugar: Do not take insulin. Treat a low blood sugar (less than 70 mg/dL) with  cup of clear juice (cranberry or apple), 4 glucose tablets, OR glucose gel. Recheck blood sugar in 15 minutes after treatment (to make sure it is greater than 70 mg/dL). If your blood sugar is not greater than 70 mg/dL on  recheck, call [phone] for further instructions. Report your blood sugar to the short stay nurse when you get to Short Stay.  If you are admitted to the hospital after surgery: Your blood sugar will be checked by the staff and you will probably be given insulin after surgery (instead of oral diabetes medicines) to make sure you have good blood sugar levels. The goal for blood sugar control after surgery is 80-180 mg/dL.   WHAT DO I DO ABOUT MY DIABETES MEDICATION?  Do not take oral diabetes medicines (pills) the morning of surgery.  THE DAY BEFORE SURGERY, take Metformin as prescribed.       THE MORNING OF SURGERY, do not take Metformin.   Reviewed and Endorsed by [facility] Patient Education Committee, August 2015                               You may not have any metal on your body including hair pins, jewelry, and body piercing             Do not wear make-up, lotions, powders, perfumes, or deodorant  Do not wear nail polish including gel and S&S, artificial/acrylic nails, or any other type of covering on natural nails including finger and toenails. If you have artificial nails, gel coating, etc. that needs to be removed by a nail salon please have this removed prior to surgery or surgery may need to be canceled/ delayed if the surgeon/ anesthesia feels like they are unable to be safely monitored.   Do not shave  48 hours prior to surgery.    Do not bring valuables to the hospital. [place].  Contacts, dentures or bridgework may not be worn into surgery.   Bring small overnight bag day of surgery.   Special Instructions: Bring a copy of your healthcare power of attorney and living will documents         the day of surgery if you haven't scanned them before.              Please read over the following fact sheets you were given: IF YOU HAVE QUESTIONS ABOUT YOUR PRE-OP INSTRUCTIONS PLEASE CALL [place] - Preparing for Surgery Before surgery, you can play an important role.  Because skin is not sterile, your skin needs to be as free of germs as possible.  You can reduce the number of germs on your skin by washing with CHG (chlorahexidine gluconate) soap before surgery.  CHG is an antiseptic cleaner which kills germs and bonds with the skin to continue killing germs even after washing. Please DO NOT use if you have an allergy to CHG or antibacterial soaps.  If your skin becomes reddened/irritated stop using the CHG and inform your nurse when you arrive at Short Stay. Do not shave (including legs and underarms) for at least 48 hours prior to the first CHG shower.  You may shave your face/neck.  Please follow these instructions carefully:  1.  Shower with CHG Soap the night before surgery and the  morning of surgery.  2.  If you choose to wash your hair, wash your hair first as usual with your normal  shampoo.  3.  After you shampoo, rinse your hair and body thoroughly to remove the shampoo.                             4.  Use CHG as you would any other liquid soap.  You can apply chg directly to the skin and wash.  Gently with a scrungie or clean washcloth.  5.  Apply the CHG Soap to your body ONLY FROM THE NECK DOWN.   Do   not use on face/ open                           Wound or open sores. Avoid contact with eyes, ears mouth and   genitals (private parts).                       Wash face,  Genitals (private parts) with your normal soap.             6.  Wash thoroughly, paying special attention to the area where your    surgery  will be performed.  7.  Thoroughly rinse your body with warm water from the neck down.  8.  DO NOT shower/wash with your normal soap after using and rinsing off the CHG Soap.                9.  Pat yourself dry with a clean towel.            10.  Wear clean pajamas.            11.  Place clean sheets on your bed the night of your first shower and do not  sleep with  pets. Day of Surgery : Do not apply any lotions/deodorants the morning of surgery.  Please wear clean clothes to the hospital/surgery center.  FAILURE TO FOLLOW THESE INSTRUCTIONS MAY RESULT IN THE CANCELLATION OF YOUR SURGERY  PATIENT SIGNATURE_________________________________  NURSE SIGNATURE__________________________________  ________________________________________________________________________   Adam Phenix  An incentive spirometer is a tool that can help keep your lungs clear and active. This tool measures how well you are filling your lungs with each breath. Taking long deep breaths may help reverse or decrease the chance of developing breathing (pulmonary) problems (especially infection) following: A long period of time when you are unable to move or be active. BEFORE THE PROCEDURE  If the spirometer includes an indicator to show your best effort, your nurse or respiratory therapist will set it to a desired goal. If possible, sit up straight or lean slightly forward. Try not to slouch. Hold the incentive spirometer in an upright position. INSTRUCTIONS FOR USE  Sit on the edge of your bed if possible, or sit up as far as you can in bed or on a chair. Hold the incentive spirometer in an upright position. Breathe out normally. Place the mouthpiece in your mouth and seal your lips tightly around it. Breathe in slowly and as deeply as possible, raising the piston or the ball toward the top of the column. Hold your breath for 3-5 seconds or for as long as possible. Allow the piston or ball to fall to the bottom of the column. Remove the mouthpiece from your mouth and breathe out normally. Rest for a few seconds and repeat Steps 1 through 7 at least 10 times every 1-2 hours when you are awake. Take your time and take a few normal breaths between deep breaths. The spirometer may include an indicator to show your best effort. Use the indicator as a goal to work toward during  each repetition. After each set of 10 deep breaths, practice coughing to be sure your lungs are clear. If you have an incision (the cut made at the time of surgery), support your incision when coughing by placing a pillow or rolled up towels firmly against it. Once you are able to get out of bed, walk around indoors and cough well. You may stop using the incentive spirometer when instructed by your caregiver.  RISKS AND COMPLICATIONS Take your time so you do not get dizzy or light-headed. If you are in pain, you may need to take or ask for pain medication before doing incentive spirometry. It is harder to take a deep breath if you are having pain. AFTER USE Rest and breathe slowly and easily. It can be helpful to keep track of a log of your progress. Your caregiver can provide you with a simple table to help with this. If you are using the spirometer at home, follow these instructions: [place] IF:  You are having difficultly using the spirometer. You have trouble using the spirometer as often as instructed. Your pain medication is not giving enough relief while using the spirometer. You develop fever of 100.5 F (38.1 C) or higher. SEEK IMMEDIATE MEDICAL CARE IF:  You cough up bloody sputum that had not been present before. You develop fever of 102 F (38.9 C) or greater. You develop worsening pain at or near the incision site. MAKE SURE YOU:  Understand these instructions. Will watch your condition. Will get help right away if you are not doing well or get worse. Document Released: 03/29/2007 Document Revised: 02/08/2012 Document Reviewed: 05/30/2007 [facility] Patient Information 2014 [place], [place].   ________________________________________________________________________

## 2022-01-23 ENCOUNTER — Encounter (HOSPITAL_COMMUNITY)
Admission: RE | Admit: 2022-01-23 | Discharge: 2022-01-23 | Disposition: A | Discharge status: Home / Self Care | Payer: Medicare HMO | Source: Ambulatory Visit | Attending: Orthopedic Surgery | Admitting: Orthopedic Surgery

## 2022-01-23 ENCOUNTER — Encounter (HOSPITAL_COMMUNITY): Payer: Self-pay

## 2022-01-23 ENCOUNTER — Other Ambulatory Visit: Payer: Self-pay

## 2022-01-23 VITALS — BP 159/75 | HR 70 | Temp 97.9°F | Ht 63.0 in | Wt 194.4 lb

## 2022-01-23 DIAGNOSIS — Z01812 Encounter for preprocedural laboratory examination: Secondary | ICD-10-CM | POA: Diagnosis not present

## 2022-01-23 DIAGNOSIS — I10 Essential (primary) hypertension: Secondary | ICD-10-CM | POA: Diagnosis not present

## 2022-01-23 DIAGNOSIS — Z01818 Encounter for other preprocedural examination: Secondary | ICD-10-CM

## 2022-01-23 LAB — CBC
HCT: 39.1 % (ref 36.0–46.0)
Hemoglobin: 11.8 g/dL — ABNORMAL LOW (ref 12.0–15.0)
MCH: 27 pg (ref 26.0–34.0)
MCHC: 30.2 g/dL (ref 30.0–36.0)
MCV: 89.5 fL (ref 80.0–100.0)
Platelets: 289 10*3/uL (ref 150–400)
RBC: 4.37 MIL/uL (ref 3.87–5.11)
RDW: 16.3 % — ABNORMAL HIGH (ref 11.5–15.5)
WBC: 6.1 10*3/uL (ref 4.0–10.5)
nRBC: 0 % (ref 0.0–0.2)

## 2022-01-23 LAB — BASIC METABOLIC PANEL
Anion gap: 8 (ref 5–15)
BUN: 12 mg/dL (ref 8–23)
CO2: 27 mmol/L (ref 22–32)
Calcium: 9.4 mg/dL (ref 8.9–10.3)
Chloride: 102 mmol/L (ref 98–111)
Creatinine, Ser: 0.81 mg/dL (ref 0.44–1.00)
GFR, Estimated: >60 mL/min (ref >60)
Glucose, Bld: 136 mg/dL — ABNORMAL HIGH (ref 70–99)
Potassium: 3.9 mmol/L (ref 3.5–5.1)
Sodium: 137 mmol/L (ref 135–145)

## 2022-01-23 LAB — GLUCOSE, CAPILLARY: Glucose-Capillary: 135 mg/dL — ABNORMAL HIGH (ref 70–99)

## 2022-01-23 LAB — SURGICAL PCR SCREEN
MRSA, PCR: NEGATIVE
Staphylococcus aureus: NEGATIVE

## 2022-02-02 ENCOUNTER — Ambulatory Visit (HOSPITAL_BASED_OUTPATIENT_CLINIC_OR_DEPARTMENT_OTHER): Payer: Medicare HMO | Admitting: Certified Registered"

## 2022-02-02 ENCOUNTER — Ambulatory Visit (HOSPITAL_COMMUNITY): Payer: Medicare HMO | Admitting: Physician Assistant

## 2022-02-02 ENCOUNTER — Ambulatory Visit (HOSPITAL_COMMUNITY)
Admission: RE | Admit: 2022-02-02 | Discharge: 2022-02-03 | Disposition: A | Discharge status: Home / Self Care | Payer: Medicare HMO | Attending: Orthopedic Surgery | Admitting: Orthopedic Surgery

## 2022-02-02 ENCOUNTER — Other Ambulatory Visit: Payer: Self-pay

## 2022-02-02 ENCOUNTER — Encounter (HOSPITAL_COMMUNITY)
Admission: RE | Disposition: A | Discharge status: Home / Self Care | Payer: Self-pay | Source: Home / Self Care | Attending: Orthopedic Surgery

## 2022-02-02 ENCOUNTER — Encounter (HOSPITAL_COMMUNITY): Payer: Self-pay | Admitting: Orthopedic Surgery

## 2022-02-02 DIAGNOSIS — N182 Chronic kidney disease, stage 2 (mild): Secondary | ICD-10-CM | POA: Insufficient documentation

## 2022-02-02 DIAGNOSIS — K219 Gastro-esophageal reflux disease without esophagitis: Secondary | ICD-10-CM | POA: Diagnosis not present

## 2022-02-02 DIAGNOSIS — M1711 Unilateral primary osteoarthritis, right knee: Secondary | ICD-10-CM

## 2022-02-02 DIAGNOSIS — J189 Pneumonia, unspecified organism: Secondary | ICD-10-CM | POA: Diagnosis not present

## 2022-02-02 DIAGNOSIS — J45909 Unspecified asthma, uncomplicated: Secondary | ICD-10-CM | POA: Insufficient documentation

## 2022-02-02 DIAGNOSIS — I10 Essential (primary) hypertension: Secondary | ICD-10-CM | POA: Diagnosis not present

## 2022-02-02 DIAGNOSIS — E1169 Type 2 diabetes mellitus with other specified complication: Secondary | ICD-10-CM | POA: Diagnosis not present

## 2022-02-02 DIAGNOSIS — E119 Type 2 diabetes mellitus without complications: Secondary | ICD-10-CM | POA: Diagnosis not present

## 2022-02-02 DIAGNOSIS — Z96651 Presence of right artificial knee joint: Secondary | ICD-10-CM | POA: Diagnosis present

## 2022-02-02 DIAGNOSIS — E785 Hyperlipidemia, unspecified: Secondary | ICD-10-CM | POA: Diagnosis not present

## 2022-02-02 DIAGNOSIS — E1122 Type 2 diabetes mellitus with diabetic chronic kidney disease: Secondary | ICD-10-CM | POA: Diagnosis not present

## 2022-02-02 DIAGNOSIS — G8918 Other acute postprocedural pain: Secondary | ICD-10-CM | POA: Diagnosis not present

## 2022-02-02 DIAGNOSIS — M179 Osteoarthritis of knee, unspecified: Secondary | ICD-10-CM

## 2022-02-02 HISTORY — PX: TOTAL KNEE ARTHROPLASTY: SHX125

## 2022-02-02 LAB — GLUCOSE, CAPILLARY
Glucose-Capillary: 131 mg/dL — ABNORMAL HIGH (ref 70–99)
Glucose-Capillary: 145 mg/dL — ABNORMAL HIGH (ref 70–99)
Glucose-Capillary: 165 mg/dL — ABNORMAL HIGH (ref 70–99)
Glucose-Capillary: 147 mg/dL — ABNORMAL HIGH (ref 70–99)
Glucose-Capillary: 189 mg/dL — ABNORMAL HIGH (ref 70–99)

## 2022-02-02 SURGERY — ARTHROPLASTY, KNEE, TOTAL
Anesthesia: Spinal | Site: Knee | Laterality: Right

## 2022-02-02 MED ORDER — DEXAMETHASONE SODIUM PHOSPHATE 10 MG/ML IJ SOLN: 8.0000 mg | Freq: Once | INTRAMUSCULAR | Status: AC

## 2022-02-02 MED ORDER — DEXAMETHASONE SODIUM PHOSPHATE 10 MG/ML IJ SOLN
10.0000 mg | Freq: Once | INTRAMUSCULAR | Status: AC
  Administered 2022-02-03: 10 mg via INTRAVENOUS
  Filled 2022-02-02: qty 1

## 2022-02-02 MED ORDER — BUPIVACAINE IN DEXTROSE 0.75-8.25 % IT SOLN
INTRATHECAL | Status: DC | PRN
  Administered 2022-02-02: 1.8 mL via INTRATHECAL

## 2022-02-02 MED ORDER — HYDROMORPHONE HCL 1 MG/ML IJ SOLN
0.5000 mg | INTRAMUSCULAR | Status: DC | PRN
  Administered 2022-02-02: 1 mg via INTRAVENOUS
  Filled 2022-02-02: qty 1

## 2022-02-02 MED ORDER — TRANEXAMIC ACID-NACL 1000-0.7 MG/100ML-% IV SOLN
1000.0000 mg | INTRAVENOUS | Status: AC
  Administered 2022-02-02: 1000 mg via INTRAVENOUS
  Filled 2022-02-02: qty 100

## 2022-02-02 MED ORDER — CHLORHEXIDINE GLUCONATE 0.12 % MT SOLN
15.0000 mL | Freq: Once | OROMUCOSAL | Status: AC
  Administered 2022-02-02: 15 mL via OROMUCOSAL

## 2022-02-02 MED ORDER — FENTANYL CITRATE PF 50 MCG/ML IJ SOSY
50.0000 ug | PREFILLED_SYRINGE | INTRAMUSCULAR | Status: AC
  Administered 2022-02-02: 50 ug via INTRAVENOUS
  Filled 2022-02-02: qty 2

## 2022-02-02 MED ORDER — MIDAZOLAM HCL 2 MG/2ML IJ SOLN
1.0000 mg | INTRAMUSCULAR | Status: AC
  Administered 2022-02-02: 2 mg via INTRAVENOUS
  Filled 2022-02-02: qty 2

## 2022-02-02 MED ORDER — PROPOFOL 10 MG/ML IV BOLUS
INTRAVENOUS | Status: DC | PRN
  Administered 2022-02-02 (×2): 20 mg via INTRAVENOUS

## 2022-02-02 MED ORDER — POTASSIUM 99 MG PO TABS: 2.0000 | ORAL_TABLET | Freq: Every day | ORAL | Status: DC

## 2022-02-02 MED ORDER — DOCUSATE SODIUM 100 MG PO CAPS
100.0000 mg | ORAL_CAPSULE | Freq: Two times a day (BID) | ORAL | Status: DC
  Administered 2022-02-02 – 2022-02-03 (×2): 100 mg via ORAL
  Filled 2022-02-02 (×2): qty 1

## 2022-02-02 MED ORDER — PROPOFOL 500 MG/50ML IV EMUL
INTRAVENOUS | Status: AC
  Filled 2022-02-02: qty 50

## 2022-02-02 MED ORDER — ROSUVASTATIN CALCIUM 10 MG PO TABS
10.0000 mg | ORAL_TABLET | Freq: Every day | ORAL | Status: DC
Start: 2022-02-03 — End: 2022-02-03
  Administered 2022-02-03: 10 mg via ORAL
  Filled 2022-02-02: qty 1

## 2022-02-02 MED ORDER — BUPIVACAINE LIPOSOME 1.3 % IJ SUSP
20.0000 mL | Freq: Once | INTRAMUSCULAR | Status: AC
Start: 2022-02-02 — End: 2022-02-02

## 2022-02-02 MED ORDER — FENTANYL CITRATE (PF) 100 MCG/2ML IJ SOLN
INTRAMUSCULAR | Status: AC
  Filled 2022-02-02: qty 2

## 2022-02-02 MED ORDER — ONDANSETRON HCL 4 MG/2ML IJ SOLN
INTRAMUSCULAR | Status: AC
  Filled 2022-02-02: qty 4

## 2022-02-02 MED ORDER — ONDANSETRON HCL 4 MG/2ML IJ SOLN: 4.0000 mg | Freq: Four times a day (QID) | INTRAMUSCULAR | Status: DC | PRN

## 2022-02-02 MED ORDER — BUPIVACAINE LIPOSOME 1.3 % IJ SUSP
INTRAMUSCULAR | Status: DC | PRN
  Administered 2022-02-02: 20 mL

## 2022-02-02 MED ORDER — ALBUMIN HUMAN 5 % IV SOLN
INTRAVENOUS | Status: AC
  Filled 2022-02-02: qty 250

## 2022-02-02 MED ORDER — RIVAROXABAN 10 MG PO TABS
10.0000 mg | ORAL_TABLET | Freq: Every day | ORAL | Status: DC
  Administered 2022-02-03: 10 mg via ORAL
  Filled 2022-02-02: qty 1

## 2022-02-02 MED ORDER — LIDOCAINE HCL (PF) 2 % IJ SOLN
INTRAMUSCULAR | Status: AC
  Filled 2022-02-02: qty 20

## 2022-02-02 MED ORDER — ACETAMINOPHEN 325 MG PO TABS: 325.0000 mg | ORAL_TABLET | Freq: Four times a day (QID) | ORAL | Status: DC | PRN

## 2022-02-02 MED ORDER — POLYETHYLENE GLYCOL 3350 17 G PO PACK: 17.0000 g | PACK | Freq: Every day | ORAL | Status: DC | PRN

## 2022-02-02 MED ORDER — LATANOPROST 0.005 % OP SOLN
1.0000 [drp] | Freq: Every day | OPHTHALMIC | Status: DC
  Administered 2022-02-02: 1 [drp] via OPHTHALMIC
  Filled 2022-02-02: qty 2.5

## 2022-02-02 MED ORDER — ONDANSETRON HCL 4 MG PO TABS: 4.0000 mg | ORAL_TABLET | Freq: Four times a day (QID) | ORAL | Status: DC | PRN

## 2022-02-02 MED ORDER — PHENYLEPHRINE HCL-NACL 20-0.9 MG/250ML-% IV SOLN
INTRAVENOUS | Status: DC | PRN
  Administered 2022-02-02: 25 ug/min via INTRAVENOUS

## 2022-02-02 MED ORDER — ACETAMINOPHEN 10 MG/ML IV SOLN
1000.0000 mg | Freq: Four times a day (QID) | INTRAVENOUS | Status: DC
Start: 2022-02-02 — End: 2022-02-02
  Administered 2022-02-02: 1000 mg via INTRAVENOUS
  Filled 2022-02-02: qty 100

## 2022-02-02 MED ORDER — HYDROCHLOROTHIAZIDE 25 MG PO TABS
25.0000 mg | ORAL_TABLET | Freq: Every day | ORAL | Status: DC
  Administered 2022-02-03: 25 mg via ORAL
  Filled 2022-02-02: qty 1

## 2022-02-02 MED ORDER — BUPIVACAINE LIPOSOME 1.3 % IJ SUSP
INTRAMUSCULAR | Status: AC
  Filled 2022-02-02: qty 20

## 2022-02-02 MED ORDER — LACTATED RINGERS IV SOLN: INTRAVENOUS | Status: DC

## 2022-02-02 MED ORDER — SODIUM CHLORIDE 0.9 % IR SOLN
Status: DC | PRN
  Administered 2022-02-02 (×2): 1000 mL

## 2022-02-02 MED ORDER — PHENYLEPHRINE HCL (PRESSORS) 10 MG/ML IV SOLN
INTRAVENOUS | Status: AC
  Filled 2022-02-02: qty 1

## 2022-02-02 MED ORDER — DEXAMETHASONE SODIUM PHOSPHATE 10 MG/ML IJ SOLN
INTRAMUSCULAR | Status: AC
  Filled 2022-02-02: qty 2

## 2022-02-02 MED ORDER — SODIUM CHLORIDE 0.9 % IV SOLN: INTRAVENOUS | Status: DC

## 2022-02-02 MED ORDER — FLEET ENEMA 7-19 GM/118ML RE ENEM: 1.0000 | ENEMA | Freq: Once | RECTAL | Status: DC | PRN

## 2022-02-02 MED ORDER — ROCURONIUM BROMIDE 10 MG/ML (PF) SYRINGE
PREFILLED_SYRINGE | INTRAVENOUS | Status: AC
  Filled 2022-02-02: qty 20

## 2022-02-02 MED ORDER — CEFAZOLIN SODIUM-DEXTROSE 2-4 GM/100ML-% IV SOLN
2.0000 g | Freq: Four times a day (QID) | INTRAVENOUS | Status: AC
  Administered 2022-02-02 (×2): 2 g via INTRAVENOUS
  Filled 2022-02-02 (×2): qty 100

## 2022-02-02 MED ORDER — METHOCARBAMOL 500 MG PO TABS
500.0000 mg | ORAL_TABLET | Freq: Four times a day (QID) | ORAL | Status: DC | PRN
  Administered 2022-02-03: 500 mg via ORAL
  Filled 2022-02-02: qty 1

## 2022-02-02 MED ORDER — SODIUM CHLORIDE (PF) 0.9 % IJ SOLN
INTRAMUSCULAR | Status: AC
  Filled 2022-02-02: qty 10

## 2022-02-02 MED ORDER — BISACODYL 10 MG RE SUPP
10.0000 mg | Freq: Every day | RECTAL | Status: DC | PRN
Start: 2022-02-02 — End: 2022-02-03

## 2022-02-02 MED ORDER — CYCLOBENZAPRINE HCL 10 MG PO TABS
10.0000 mg | ORAL_TABLET | Freq: Every day | ORAL | Status: DC
  Administered 2022-02-02: 10 mg via ORAL
  Filled 2022-02-02: qty 1

## 2022-02-02 MED ORDER — OXYCODONE HCL 5 MG PO TABS
5.0000 mg | ORAL_TABLET | ORAL | Status: DC | PRN
  Administered 2022-02-02 – 2022-02-03 (×5): 10 mg via ORAL
  Filled 2022-02-02 (×6): qty 2

## 2022-02-02 MED ORDER — DEXAMETHASONE SODIUM PHOSPHATE 10 MG/ML IJ SOLN
INTRAMUSCULAR | Status: DC | PRN
  Administered 2022-02-02: 8 mg via INTRAVENOUS

## 2022-02-02 MED ORDER — METOCLOPRAMIDE HCL 5 MG/ML IJ SOLN: 5.0000 mg | Freq: Three times a day (TID) | INTRAMUSCULAR | Status: DC | PRN

## 2022-02-02 MED ORDER — PROPOFOL 500 MG/50ML IV EMUL
INTRAVENOUS | Status: DC | PRN
Start: 2022-02-02 — End: 2022-02-02
  Administered 2022-02-02: 75 ug/kg/min via INTRAVENOUS

## 2022-02-02 MED ORDER — SODIUM CHLORIDE (PF) 0.9 % IJ SOLN
INTRAMUSCULAR | Status: AC
  Filled 2022-02-02: qty 50

## 2022-02-02 MED ORDER — MIDAZOLAM HCL 2 MG/2ML IJ SOLN
INTRAMUSCULAR | Status: AC
  Filled 2022-02-02: qty 2

## 2022-02-02 MED ORDER — PHENOL 1.4 % MT LIQD: 1.0000 | OROMUCOSAL | Status: DC | PRN

## 2022-02-02 MED ORDER — MOMETASONE FURO-FORMOTEROL FUM 200-5 MCG/ACT IN AERO
2.0000 | INHALATION_SPRAY | Freq: Two times a day (BID) | RESPIRATORY_TRACT | Status: DC
  Administered 2022-02-02 – 2022-02-03 (×2): 2 via RESPIRATORY_TRACT
  Filled 2022-02-02: qty 8.8

## 2022-02-02 MED ORDER — DIPHENHYDRAMINE HCL 12.5 MG/5ML PO ELIX: 12.5000 mg | ORAL_SOLUTION | ORAL | Status: DC | PRN

## 2022-02-02 MED ORDER — MENTHOL 3 MG MT LOZG: 1.0000 | LOZENGE | OROMUCOSAL | Status: DC | PRN

## 2022-02-02 MED ORDER — METOCLOPRAMIDE HCL 5 MG PO TABS: 5.0000 mg | ORAL_TABLET | Freq: Three times a day (TID) | ORAL | Status: DC | PRN

## 2022-02-02 MED ORDER — PROPOFOL 1000 MG/100ML IV EMUL
INTRAVENOUS | Status: AC
  Filled 2022-02-02: qty 300

## 2022-02-02 MED ORDER — INSULIN ASPART 100 UNIT/ML IJ SOLN
0.0000 [IU] | Freq: Three times a day (TID) | INTRAMUSCULAR | Status: DC
  Administered 2022-02-02: 3 [IU] via SUBCUTANEOUS
  Administered 2022-02-02: 2 [IU] via SUBCUTANEOUS
  Administered 2022-02-03: 3 [IU] via SUBCUTANEOUS
  Administered 2022-02-03: 2 [IU] via SUBCUTANEOUS

## 2022-02-02 MED ORDER — ONDANSETRON HCL 4 MG/2ML IJ SOLN
INTRAMUSCULAR | Status: DC | PRN
  Administered 2022-02-02: 4 mg via INTRAVENOUS

## 2022-02-02 MED ORDER — PHENYLEPHRINE 40 MCG/ML (10ML) SYRINGE FOR IV PUSH (FOR BLOOD PRESSURE SUPPORT)
PREFILLED_SYRINGE | INTRAVENOUS | Status: AC
  Filled 2022-02-02: qty 40

## 2022-02-02 MED ORDER — PROPOFOL 10 MG/ML IV BOLUS
INTRAVENOUS | Status: AC
  Filled 2022-02-02: qty 20

## 2022-02-02 MED ORDER — TRAMADOL HCL 50 MG PO TABS
50.0000 mg | ORAL_TABLET | Freq: Four times a day (QID) | ORAL | Status: DC | PRN
  Administered 2022-02-02: 100 mg via ORAL
  Filled 2022-02-02: qty 2

## 2022-02-02 MED ORDER — DEXTROSE 5 % IV SOLN
500.0000 mg | Freq: Four times a day (QID) | INTRAVENOUS | Status: DC | PRN
  Filled 2022-02-02: qty 5

## 2022-02-02 MED ORDER — FLUTICASONE PROPIONATE 50 MCG/ACT NA SUSP
2.0000 | Freq: Every day | NASAL | Status: DC | PRN
Start: 2022-02-02 — End: 2022-02-03

## 2022-02-02 MED ORDER — EPHEDRINE 5 MG/ML INJ
INTRAVENOUS | Status: AC
  Filled 2022-02-02: qty 5

## 2022-02-02 MED ORDER — LIDOCAINE 2% (20 MG/ML) 5 ML SYRINGE
INTRAMUSCULAR | Status: DC | PRN
  Administered 2022-02-02: 60 mg via INTRAVENOUS

## 2022-02-02 MED ORDER — ACETAMINOPHEN 500 MG PO TABS
1000.0000 mg | ORAL_TABLET | Freq: Four times a day (QID) | ORAL | Status: AC
  Administered 2022-02-02 – 2022-02-03 (×3): 1000 mg via ORAL
  Filled 2022-02-02 (×3): qty 2

## 2022-02-02 MED ORDER — POVIDONE-IODINE 10 % EX SWAB
2.0000 "application " | Freq: Once | CUTANEOUS | Status: AC
  Administered 2022-02-02: 2 via TOPICAL

## 2022-02-02 MED ORDER — SODIUM CHLORIDE (PF) 0.9 % IJ SOLN
INTRAMUSCULAR | Status: DC | PRN
  Administered 2022-02-02: 60 mL

## 2022-02-02 MED ORDER — BUPIVACAINE-EPINEPHRINE (PF) 0.5% -1:200000 IJ SOLN
INTRAMUSCULAR | Status: DC | PRN
  Administered 2022-02-02: 25 mL

## 2022-02-02 MED ORDER — ORAL CARE MOUTH RINSE: 15.0000 mL | Freq: Once | OROMUCOSAL | Status: AC

## 2022-02-02 MED ORDER — HYDROMORPHONE HCL 1 MG/ML IJ SOLN: 0.2500 mg | INTRAMUSCULAR | Status: DC | PRN

## 2022-02-02 MED ORDER — CEFAZOLIN SODIUM-DEXTROSE 2-4 GM/100ML-% IV SOLN
2.0000 g | INTRAVENOUS | Status: AC
  Administered 2022-02-02: 2 g via INTRAVENOUS
  Filled 2022-02-02: qty 100

## 2022-02-02 MED ORDER — PANTOPRAZOLE SODIUM 40 MG PO TBEC
80.0000 mg | DELAYED_RELEASE_TABLET | Freq: Every day | ORAL | Status: DC
  Administered 2022-02-03: 80 mg via ORAL
  Filled 2022-02-02: qty 2

## 2022-02-02 SURGICAL SUPPLY — 54 items
ATTUNE PS FEM RT SZ 5 CEM KNEE (Femur) ×1 IMPLANT
ATTUNE PSRP INSR SZ 5 10M KNEE (Insert) ×1 IMPLANT
BAG COUNTER SPONGE SURGICOUNT (BAG) ×1 IMPLANT
BAG SPEC THK2 15X12 ZIP CLS (MISCELLANEOUS) ×1
BAG SPNG CNTER NS LX DISP (BAG) ×1
BAG ZIPLOCK 12X15 (MISCELLANEOUS) ×2 IMPLANT
BASEPLATE TIBIAL ROTATING SZ 4 (Knees) ×1 IMPLANT
BLADE SAG 18X100X1.27 (BLADE) ×2 IMPLANT
BLADE SAW SGTL 11.0X1.19X90.0M (BLADE) ×2 IMPLANT
BNDG ELASTIC 6X5.8 VLCR STR LF (GAUZE/BANDAGES/DRESSINGS) ×2 IMPLANT
BOWL SMART MIX CTS (DISPOSABLE) ×2 IMPLANT
BSPLAT TIB 4 CMNT ROT PLAT STR (Knees) ×1 IMPLANT
CEMENT HV SMART SET (Cement) ×4 IMPLANT
COVER SURGICAL LIGHT HANDLE (MISCELLANEOUS) ×2 IMPLANT
CUFF TOURN SGL QUICK 34 (TOURNIQUET CUFF) ×2
CUFF TRNQT CYL 34X4.125X (TOURNIQUET CUFF) ×1 IMPLANT
DRAPE INCISE IOBAN 66X45 STRL (DRAPES) ×2 IMPLANT
DRAPE U-SHAPE 47X51 STRL (DRAPES) ×2 IMPLANT
DRSG AQUACEL AG ADV 3.5X10 (GAUZE/BANDAGES/DRESSINGS) ×2 IMPLANT
DURAPREP 26ML APPLICATOR (WOUND CARE) ×2 IMPLANT
ELECT REM PT RETURN 15FT ADLT (MISCELLANEOUS) ×2 IMPLANT
GLOVE SRG 8 PF TXTR STRL LF DI (GLOVE) ×1 IMPLANT
GLOVE SURG ENC MOIS LTX SZ6.5 (GLOVE) ×2 IMPLANT
GLOVE SURG ENC MOIS LTX SZ8 (GLOVE) ×4 IMPLANT
GLOVE SURG UNDER POLY LF SZ7 (GLOVE) ×2 IMPLANT
GLOVE SURG UNDER POLY LF SZ8 (GLOVE) ×2
GLOVE SURG UNDER POLY LF SZ8.5 (GLOVE) ×2 IMPLANT
GOWN STRL REUS W/TWL LRG LVL3 (GOWN DISPOSABLE) ×2 IMPLANT
GOWN STRL REUS W/TWL XL LVL3 (GOWN DISPOSABLE) IMPLANT
HANDPIECE INTERPULSE COAX TIP (DISPOSABLE) ×2
HOLDER FOLEY CATH W/STRAP (MISCELLANEOUS) ×1 IMPLANT
IMMOBILIZER KNEE 20 (SOFTGOODS) ×2
IMMOBILIZER KNEE 20 THIGH 36 (SOFTGOODS) ×1 IMPLANT
KIT TURNOVER KIT A (KITS) IMPLANT
MANIFOLD NEPTUNE II (INSTRUMENTS) ×2 IMPLANT
NS IRRIG 1000ML POUR BTL (IV SOLUTION) ×2 IMPLANT
PACK TOTAL KNEE CUSTOM (KITS) ×2 IMPLANT
PADDING CAST COTTON 6X4 STRL (CAST SUPPLIES) ×3 IMPLANT
PATELLA MEDIAL ATTUN 35MM KNEE (Knees) ×1 IMPLANT
PROTECTOR NERVE ULNAR (MISCELLANEOUS) ×2 IMPLANT
SET HNDPC FAN SPRY TIP SCT (DISPOSABLE) ×1 IMPLANT
SPIKE FLUID TRANSFER (MISCELLANEOUS) ×2 IMPLANT
SPONGE T-LAP 18X18 ~~LOC~~+RFID (SPONGE) ×4 IMPLANT
STRIP CLOSURE SKIN 1/2X4 (GAUZE/BANDAGES/DRESSINGS) ×4 IMPLANT
SUT MNCRL AB 4-0 PS2 18 (SUTURE) ×2 IMPLANT
SUT STRATAFIX 0 PDS 27 VIOLET (SUTURE) ×2
SUT VIC AB 2-0 CT1 27 (SUTURE) ×6
SUT VIC AB 2-0 CT1 TAPERPNT 27 (SUTURE) ×3 IMPLANT
SUTURE STRATFX 0 PDS 27 VIOLET (SUTURE) ×1 IMPLANT
TRAY FOLEY MTR SLVR 14FR STAT (SET/KITS/TRAYS/PACK) ×1 IMPLANT
TRAY FOLEY MTR SLVR 16FR STAT (SET/KITS/TRAYS/PACK) ×1 IMPLANT
TUBE SUCTION HIGH CAP CLEAR NV (SUCTIONS) ×2 IMPLANT
WATER STERILE IRR 1000ML POUR (IV SOLUTION) ×4 IMPLANT
WRAP KNEE MAXI GEL POST OP (GAUZE/BANDAGES/DRESSINGS) ×2 IMPLANT

## 2022-02-02 NOTE — Evaluation (Signed)
Physical Therapy Evaluation ?Patient Details ?Name: SHARDAY MICHL ?MRN: [id number] ?DOB: 06/01/1951 ?Today's Date: 02/02/2022 ? ?History of Present Illness ? 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 The patient is sleepy, did mobilize and ambulate x 10 feet using Rw. Patient should progress to Dc home with family support. Pt admitted with above diagnosis.  Pt currently with functional limitations due to the deficits listed below (see PT Problem List). Pt will benefit from skilled PT to increase their independence and safety with mobility to allow discharge to the venue listed below.   ?   ?   ? ?Recommendations for follow up therapy are one component of a multi-disciplinary discharge planning process, led by the attending physician.  Recommendations may be updated based on patient status, additional functional criteria and insurance authorization. ? ?Follow Up Recommendations Follow physician's recommendations for discharge plan and follow up therapies ? ?  ?Assistance Recommended at Discharge Intermittent Supervision/Assistance  ?Patient can return home with the following ? A little help with walking and/or transfers;Assistance with cooking/housework;Assist for transportation;A little help with bathing/dressing/bathroom;Help with stairs or ramp for entrance ? ?  ?Equipment Recommendations Rolling walker (2 wheels)  ?Recommendations for Other Services ?    ?  ?Functional Status Assessment Patient has had a recent decline in their functional status and demonstrates the ability to make significant improvements in function in a reasonable and predictable amount of time.  ? ?  ?Precautions / Restrictions Precautions ?Precautions: Fall;Knee ?Required Braces or Orthoses: Knee Immobilizer - Right ?Restrictions ?Weight Bearing Restrictions: No  ? ?  ? ?Mobility ? Bed Mobility ?Overal bed mobility: Needs Assistance ?Bed Mobility: Supine to Sit ?  ?  ?Supine to sit: Min assist ?  ?  ?General bed mobility  comments: support right leg to lower to the floor, used belt also ?  ? ?Transfers ?Overall transfer level: Needs assistance ?  ?Transfers: Sit to/from Stand ?Sit to Stand: Min assist ?  ?  ?  ?  ?  ?General transfer comment: cues for hand and right leg position ?  ? ?Ambulation/Gait ?Ambulation/Gait assistance: Min assist ?Gait Distance (Feet): 10 Feet ?Assistive device: Rolling walker (2 wheels) ?Gait Pattern/deviations: Step-to pattern, Antalgic, Decreased step length - right ?Gait velocity: decr ?  ?  ?General Gait Details: cues for posture, sequence ? ?Stairs ?  ?  ?  ?  ?  ? ?Wheelchair Mobility ?  ? ?Modified Rankin (Stroke Patients Only) ?  ? ?  ? ?Balance Overall balance assessment: Needs assistance ?Sitting-balance support: Bilateral upper extremity supported, Feet supported ?Sitting balance-Leahy Scale: Fair ?  ?  ?Standing balance support: During functional activity, Bilateral upper extremity supported, Reliant on assistive device for balance ?Standing balance-Leahy Scale: Poor ?  ?  ?  ?  ?  ?  ?  ?  ?  ?  ?  ?  ?   ? ? ? ?Pertinent Vitals/Pain Pain Assessment ?Pain Assessment: 0-10 ?Pain Score: 6  ?Pain Location: right knee ?Pain Descriptors / Indicators: Aching, Grimacing, Dull ?Pain Intervention(s): Monitored during session, Repositioned, Ice applied, Limited activity within patient's tolerance, Patient requesting pain meds-RN notified  ? ? ?Home Living Family/patient expects to be discharged to:: Private residence ?Living Arrangements: Spouse/significant other;Children ?Available Help at Discharge: Family;Available PRN/intermittently ?Type of Home: House ?Home Access: Level entry ?  ?  ?  ?Home Layout: One level ?Home Equipment: None ?   ?  ?Prior Function Prior Level of Function : Independent/Modified Independent ?  ?  ?  ?  ?  ?  ?  ?  ?  ? ? ?  Hand Dominance  ? Dominant Hand: Right ? ?  ?Extremity/Trunk Assessment  ? Upper Extremity Assessment ?Upper Extremity Assessment: Overall WFL for tasks  assessed ?  ? ?Lower Extremity Assessment ?Lower Extremity Assessment: RLE deficits/detail ?RLE Deficits / Details: slight SLR performed ?  ? ?Cervical / Trunk Assessment ?Cervical / Trunk Assessment: Normal  ?Communication  ? Communication: No difficulties  ?Cognition Arousal/Alertness: Awake/alert ?Behavior During Therapy: [facility] for tasks assessed/performed ?Overall Cognitive Status: Within Functional Limits for tasks assessed ?  ?  ?  ?  ?  ?  ?  ?  ?  ?  ?  ?  ?  ?  ?  ?  ?  ?  ?  ? ?  ?General Comments   ? ?  ?Exercises    ? ?Assessment/Plan  ?  ?PT Assessment Patient needs continued PT services  ?PT Problem List Decreased strength;Decreased mobility;Decreased safety awareness;Decreased range of motion;Decreased knowledge of precautions;Decreased activity tolerance;Pain;Decreased knowledge of use of DME ? ?   ?  ?PT Treatment Interventions DME instruction;Therapeutic activities;Gait training;Therapeutic exercise;Patient/family education;Functional mobility training   ? ?PT Goals (Current goals can be found in the Care Plan section)  ?Acute Rehab PT Goals ?Patient Stated Goal: go home ?PT Goal Formulation: With patient/family ?Time For Goal Achievement: 02/09/22 ?Potential to Achieve Goals: Good ? ?  ?Frequency 7X/week ?  ? ? ?Co-evaluation   ?  ?  ?  ?  ? ? ?  ?AM-PAC PT "6 Clicks" Mobility  ?Outcome Measure Help needed turning from your back to your side while in a flat bed without using bedrails?: A Little ?Help needed moving from lying on your back to sitting on the side of a flat bed without using bedrails?: A Little ?Help needed moving to and from a bed to a chair (including a wheelchair)?: A Lot ?Help needed standing up from a chair using your arms (e.g., wheelchair or bedside chair)?: A Lot ?Help needed to walk in hospital room?: A Lot ?Help needed climbing 3-5 steps with a railing? : A Lot ?6 Click Score: 14 ? ?  ?End of Session Equipment Utilized During Treatment: Gait belt;Right knee  immobilizer ?Activity Tolerance: Patient limited by pain ?Patient left: in chair;with call bell/phone within reach;with family/visitor present;with chair alarm set ?Nurse Communication: Mobility status ?PT Visit Diagnosis: Unsteadiness on feet (R26.81);Difficulty in walking, not elsewhere classified (R26.2);Pain ?Pain - Right/Left: Right ?Pain - part of body: Knee ?  ? ?Time: 0973-5329 ?PT Time Calculation (min) (ACUTE ONLY): 30 min ? ? ?Charges:   PT Evaluation ?$PT Eval Low Complexity: 1 Low ?PT Treatments ?$Gait Training: 8-22 mins ?  ?   ? ? ?Tresa Endo PT ?Acute Rehabilitation Services ?Pager [phone] ?Office [phone] ? ? ?Claretha Cooper ?02/02/2022, 5:04 PM ? ?

## 2022-02-02 NOTE — Discharge Instructions (Signed)

## 2022-02-02 NOTE — Progress Notes (Signed)
AssistedDr. Charlene Green with right, ultrasound guided, adductor canal block. Side rails up, monitors on throughout procedure. See vital signs in flow sheet. Tolerated Procedure well. ? ?

## 2022-02-02 NOTE — Transfer of Care (Signed)
Immediate Anesthesia Transfer of Care Note ? ?Patient: Lauren Lopez ? ?Procedure(s) Performed: TOTAL KNEE ARTHROPLASTY (Right: Knee) ? ?Patient Location: PACU ? ?Anesthesia Type:Spinal ? ?Level of Consciousness: sedated ? ?Airway & Oxygen Therapy: Patient Spontanous Breathing and Patient connected to face mask oxygen ? ?Post-op Assessment: Report given to RN and Post -op Vital signs reviewed and stable ? ?Post vital signs: Reviewed and stable ? ?Last Vitals:  ?Vitals Value Taken Time  ?BP 131/75 02/02/22 0947  ?Temp    ?Pulse 78 02/02/22 0949  ?Resp 16 02/02/22 0949  ?SpO2 100 % 02/02/22 0949  ?Vitals shown include unvalidated device data. ? ?Last Pain:  ?Vitals:  ? 02/02/22 0810  ?TempSrc:   ?PainSc: 0-No pain  ?   ? ?Patients Stated Pain Goal: 4 (02/02/22 4591) ? ?Complications: No notable events documented. ?

## 2022-02-02 NOTE — Care Plan (Signed)
Ortho Bundle Case Management Note ? ?Patient Details  ?Name: Lauren Lopez ?MRN: [id number] ?Date of Birth: [birth year] ? ?R TKA on 02-02-22 ?DCP:  Home with dtr and husband ?DME:  RW ordered through [facility] ?PT:  EO on 02-05-22                ? ? ? ?DME Arranged:  Walker rolling ?DME Agency:  [facility] ? ?HH Arranged:  NA ?[place] Agency:  NA ? ?Additional Comments: ?Please contact me with any questions of if this plan should need to change. ? ?Larwance Rote  [phone] ?02/02/2022, 4:29 PM ?  ?

## 2022-02-02 NOTE — Anesthesia Postprocedure Evaluation (Signed)
Anesthesia Post Note ? ?Patient: Lauren Lopez ? ?Procedure(s) Performed: TOTAL KNEE ARTHROPLASTY (Right: Knee) ? ?  ? ?Patient location during evaluation: PACU ?Anesthesia Type: Spinal ?Level of consciousness: awake ?Pain management: pain level controlled ?Vital Signs Assessment: post-procedure vital signs reviewed and stable ?Respiratory status: spontaneous breathing ?Cardiovascular status: stable ?Postop Assessment: no apparent nausea or vomiting ?Anesthetic complications: no ? ? ?No notable events documented. ? ?Last Vitals:  ?Vitals:  ? 02/02/22 1253 02/02/22 1637  ?BP: 134/67 129/73  ?Pulse: 72 72  ?Resp: 14 14  ?Temp: 36.4 ?C (!) 36.3 ?C  ?SpO2: 100% 100%  ?  ?Last Pain:  ?Vitals:  ? 02/02/22 1637  ?TempSrc: Oral  ?PainSc:   ? ? ?  ?  ?  ?  ?  ?  ? ?Ralonda Tartt ? ? ? ? ?

## 2022-02-02 NOTE — Op Note (Signed)
OPERATIVE REPORT-TOTAL KNEE ARTHROPLASTY ? ? ?Pre-operative diagnosis- Osteoarthritis  Right knee(s) ? ?Post-operative diagnosis- Osteoarthritis Right knee(s) ? ?Procedure-  Right  Total Knee Arthroplasty ? ?Surgeon- Dione Plover. Muskaan Smet, MD ? ?Assistant- Fenton Foy, PA-C  ? ?Anesthesia-   Adductor canal block and spinal ? ?EBL-50 mL ?  ?Drains None ? ?Tourniquet time-  ?Total Tourniquet Time Documented: ?Thigh (Right) - 35 minutes ?Total: Thigh (Right) - 35 minutes ?   ? ?Complications- None ? ?Condition-PACU - hemodynamically stable.  ? ?Brief Clinical Note  Lauren Lopez is a 71 y.o. year old female with end stage OA of her right knee with progressively worsening pain and dysfunction. She has constant pain, with activity and at rest and significant functional deficits with difficulties even with ADLs. She has had extensive non-op management including analgesics, injections of cortisone and viscosupplements, and home exercise program, but remains in significant pain with significant dysfunction.Radiographs show bone on bone arthritis medial and patellofemoral. She presents now for right Total Knee Arthroplasty.    ? ?Procedure in detail--- ? ? The patient is brought into the operating room and positioned supine on the operating table. After successful administration of  Adductor canal block and spinal,   a tourniquet is placed high on the  Right thigh(s) and the lower extremity is prepped and draped in the usual sterile fashion. Time out is performed by the operating team and then the  Right lower extremity is wrapped in Esmarch, knee flexed and the tourniquet inflated to 300 mmHg.  ?     A midline incision is made with a ten blade through the subcutaneous tissue to the level of the extensor mechanism. A fresh blade is used to make a medial parapatellar arthrotomy. Soft tissue over the proximal medial tibia is subperiosteally elevated to the joint line with a knife and into the semimembranosus bursa with a  Cobb elevator. Soft tissue over the proximal lateral tibia is elevated with attention being paid to avoiding the patellar tendon on the tibial tubercle. The patella is everted, knee flexed 90 degrees and the ACL and PCL are removed. Findings are bone on bone medial and patellofemoral with large global osteophytes   ?     The drill is used to create a starting hole in the distal femur and the canal is thoroughly irrigated with sterile saline to remove the fatty contents. The 5 degree Right  valgus alignment guide is placed into the femoral canal and the distal femoral cutting block is pinned to remove 9 mm off the distal femur. Resection is made with an oscillating saw. ?     The tibia is subluxed forward and the menisci are removed. The extramedullary alignment guide is placed referencing proximally at the medial aspect of the tibial tubercle and distally along the second metatarsal axis and tibial crest. The block is pinned to remove 42m off the more deficient medial  side. Resection is made with an oscillating saw. Size 4is the most appropriate size for the tibia and the proximal tibia is prepared with the modular drill and keel punch for that size. ?     The femoral sizing guide is placed and size 5 is most appropriate. Rotation is marked off the epicondylar axis and confirmed by creating a rectangular flexion gap at 90 degrees. The size 5 cutting block is pinned in this rotation and the anterior, posterior and chamfer cuts are made with the oscillating saw. The intercondylar block is then placed and that cut is made. ?  Trial size 4 tibial component, trial size 5 posterior stabilized femur and a 10  mm posterior stabilized rotating platform insert trial is placed. Full extension is achieved with excellent varus/valgus and anterior/posterior balance throughout full range of motion. The patella is everted and thickness measured to be 22  mm. Free hand resection is taken to 12 mm, a 35 template is placed, lug  holes are drilled, trial patella is placed, and it tracks normally. Osteophytes are removed off the posterior femur with the trial in place. All trials are removed and the cut bone surfaces prepared with pulsatile lavage. Cement is mixed and once ready for implantation, the size 4 tibial implant, size  5 posterior stabilized femoral component, and the size 35 patella are cemented in place and the patella is held with the clamp. The trial insert is placed and the knee held in full extension. The Exparel (20 ml mixed with 60 ml saline) is injected into the extensor mechanism, posterior capsule, medial and lateral gutters and subcutaneous tissues.  All extruded cement is removed and once the cement is hard the permanent 10 mm posterior stabilized rotating platform insert is placed into the tibial tray. ?     The wound is copiously irrigated with saline solution and the extensor mechanism closed with # 0 Stratofix suture. The tourniquet is released for a total tourniquet time of 35  minutes. Flexion against gravity is 140 degrees and the patella tracks normally. Subcutaneous tissue is closed with 2.0 vicryl and subcuticular with running 4.0 Monocryl. The incision is cleaned and dried and steri-strips and a bulky sterile dressing are applied. The limb is placed into a knee immobilizer and the patient is awakened and transported to recovery in stable condition. ?     Please note that a surgical assistant was a medical necessity for this procedure in order to perform it in a safe and expeditious manner. Surgical assistant was necessary to retract the ligaments and vital neurovascular structures to prevent injury to them and also necessary for proper positioning of the limb to allow for anatomic placement of the prosthesis. ? ? ?Dione Plover Samhitha Rosen, MD ? ? ? ?02/02/2022, 9:19 AM ? ? ?

## 2022-02-02 NOTE — Progress Notes (Signed)
Orthopedic Tech Progress Note ?Patient Details:  ?Lauren Lopez ?[birth year] ?[id number] ? ?CPM Right Knee ?CPM Right Knee: On ?Right Knee Flexion (Degrees): 40 ?Right Knee Extension (Degrees): 10 ? ?Post Interventions ?Patient Tolerated: Well ?Instructions Provided: Care of device, Adjustment of device ? ?[name] ?02/02/2022, 9:45 AM ? ?

## 2022-02-02 NOTE — Anesthesia Procedure Notes (Signed)
Spinal ? ?Patient location during procedure: OR ?End time: 02/02/2022 8:23 AM ?Reason for block: surgical anesthesia ?Staffing ?Performed: resident/CRNA  ?Resident/CRNA: Cynda Familia, CRNA ?Preanesthetic Checklist ?Completed: patient identified, IV checked, site marked, risks and benefits discussed, surgical consent, monitors and equipment checked, pre-op evaluation and timeout performed ?Spinal Block ?Patient position: sitting ?Prep: DuraPrep and site prepped and draped ?Patient monitoring: heart rate, continuous pulse ox, blood pressure and cardiac monitor ?Approach: midline ?Location: L3-4 ?Injection technique: single-shot ?Needle ?Needle type: Sprotte  ?Needle gauge: 24 G ?Assessment ?Sensory level: T6 ?Events: CSF return ?Additional Notes ?Expiration date of tray noted and within date.   Patient tolerated procedure well. Green supervised and assisted- prep dry at time of insertion ? ? ? ?

## 2022-02-02 NOTE — Interval H&P Note (Signed)
History and Physical Interval Note: ? ?02/02/2022 ?6:49 AM ? ?Lauren Lopez  has presented today for surgery, with the diagnosis of right knee osteoarthritis.  The various methods of treatment have been discussed with the patient and family. After consideration of risks, benefits and other options for treatment, the patient has consented to  Procedure(s): ?TOTAL KNEE ARTHROPLASTY (Right) as a surgical intervention.  The patient's history has been reviewed, patient examined, no change in status, stable for surgery.  I have reviewed the patient's chart and labs.  Questions were answered to the patient's satisfaction.   ? ? ?Lauren Lopez ? ? ?

## 2022-02-02 NOTE — Anesthesia Procedure Notes (Signed)
Date/Time: 02/02/2022 8:15 AM ?Performed by: Cynda Familia, CRNA ?Pre-anesthesia Checklist: Patient identified, Emergency Drugs available, Suction available, Patient being monitored and Timeout performed ?Patient Re-evaluated:Patient Re-evaluated prior to induction ?Oxygen Delivery Method: Simple face mask ?Placement Confirmation: positive ETCO2 and breath sounds checked- equal and bilateral ?Dental Injury: Teeth and Oropharynx as per pre-operative assessment  ? ? ? ? ?

## 2022-02-02 NOTE — Anesthesia Procedure Notes (Signed)
Anesthesia Regional Block: Adductor canal block  ? ?Pre-Anesthetic Checklist: , timeout performed,  Correct Patient, Correct Site, Correct Laterality,  Correct Procedure, Correct Position, site marked,  Risks and benefits discussed,  Surgical consent,  Pre-op evaluation,  At surgeon's request and post-op pain management ? ?Laterality: Right ? ?Prep: chloraprep     ?  ?Needles:  ?Injection technique: Single-shot ? ?Needle Type: Stimulator Needle - 80   ? ? ? ? ? ? ? ?Additional Needles: ? ? ?Procedures: Doppler guided,,,, ultrasound used (permanent image in chart),,    ?Narrative:  ?Start time: 02/02/2022 8:05 AM ?End time: 02/02/2022 8:20 AM ?Injection made incrementally with aspirations every 5 mL. ? ?Performed by: Personally  ?Anesthesiologist: Belinda Block, MD ? ? ? ? ?

## 2022-02-02 NOTE — Anesthesia Preprocedure Evaluation (Signed)
Anesthesia Evaluation  ?Patient identified by MRN, date of birth, ID band ?Patient awake ? ? ? ?Reviewed: ?Allergy & Precautions, NPO status , Patient's Chart, lab work & pertinent test results ? ?Airway ?Mallampati: II ? ?TM Distance: >3 FB ? ? ? ? Dental ?  ?Pulmonary ?asthma , pneumonia,  ?  ?breath sounds clear to auscultation ? ? ? ? ? ? Cardiovascular ?hypertension,  ?Rhythm:Regular Rate:Normal ? ? ?  ?Neuro/Psych ? Neuromuscular disease   ? GI/Hepatic ?Neg liver ROS, GERD  ,  ?Endo/Other  ?diabetes ? Renal/GU ?Renal disease  ? ?  ?Musculoskeletal ? ? Abdominal ?  ?Peds ? Hematology ?  ?Anesthesia Other Findings ? ? Reproductive/Obstetrics ? ?  ? ? ? ? ? ? ? ? ? ? ? ? ? ?  ?  ? ? ? ? ? ? ? ? ?Anesthesia Physical ?Anesthesia Plan ? ?ASA: 3 ? ?Anesthesia Plan: Spinal  ? ?Post-op Pain Management:   ? ?Induction: Intravenous ? ?PONV Risk Score and Plan: Ondansetron, Dexamethasone and Midazolam ? ?Airway Management Planned: Simple Face Mask ? ?Additional Equipment:  ? ?Intra-op Plan:  ? ?Post-operative Plan:  ? ?Informed Consent: I have reviewed the patients History and Physical, chart, labs and discussed the procedure including the risks, benefits and alternatives for the proposed anesthesia with the patient or authorized representative who has indicated his/her understanding and acceptance.  ? ? ? ?Dental advisory given ? ?Plan Discussed with: Anesthesiologist and CRNA ? ?Anesthesia Plan Comments:   ? ? ? ? ? ? ?Anesthesia Quick Evaluation ? ?

## 2022-02-03 ENCOUNTER — Encounter (HOSPITAL_COMMUNITY): Payer: Self-pay | Admitting: Orthopedic Surgery

## 2022-02-03 DIAGNOSIS — K219 Gastro-esophageal reflux disease without esophagitis: Secondary | ICD-10-CM | POA: Diagnosis not present

## 2022-02-03 DIAGNOSIS — I10 Essential (primary) hypertension: Secondary | ICD-10-CM | POA: Diagnosis not present

## 2022-02-03 DIAGNOSIS — E1122 Type 2 diabetes mellitus with diabetic chronic kidney disease: Secondary | ICD-10-CM | POA: Diagnosis not present

## 2022-02-03 DIAGNOSIS — N182 Chronic kidney disease, stage 2 (mild): Secondary | ICD-10-CM | POA: Diagnosis not present

## 2022-02-03 DIAGNOSIS — E785 Hyperlipidemia, unspecified: Secondary | ICD-10-CM | POA: Diagnosis not present

## 2022-02-03 DIAGNOSIS — Z96651 Presence of right artificial knee joint: Secondary | ICD-10-CM | POA: Diagnosis not present

## 2022-02-03 DIAGNOSIS — M1711 Unilateral primary osteoarthritis, right knee: Secondary | ICD-10-CM | POA: Diagnosis not present

## 2022-02-03 DIAGNOSIS — J45909 Unspecified asthma, uncomplicated: Secondary | ICD-10-CM | POA: Diagnosis not present

## 2022-02-03 DIAGNOSIS — E1169 Type 2 diabetes mellitus with other specified complication: Secondary | ICD-10-CM | POA: Diagnosis not present

## 2022-02-03 LAB — BASIC METABOLIC PANEL
Anion gap: 5 (ref 5–15)
BUN: 13 mg/dL (ref 8–23)
CO2: 24 mmol/L (ref 22–32)
Calcium: 8.4 mg/dL — ABNORMAL LOW (ref 8.9–10.3)
Chloride: 104 mmol/L (ref 98–111)
Creatinine, Ser: 0.72 mg/dL (ref 0.44–1.00)
GFR, Estimated: >60 mL/min (ref >60)
Glucose, Bld: 148 mg/dL — ABNORMAL HIGH (ref 70–99)
Potassium: 3.6 mmol/L (ref 3.5–5.1)
Sodium: 133 mmol/L — ABNORMAL LOW (ref 135–145)

## 2022-02-03 LAB — CBC
HCT: 36.7 % (ref 36.0–46.0)
Hemoglobin: 11 g/dL — ABNORMAL LOW (ref 12.0–15.0)
MCH: 26.6 pg (ref 26.0–34.0)
MCHC: 30 g/dL (ref 30.0–36.0)
MCV: 88.9 fL (ref 80.0–100.0)
Platelets: 308 10*3/uL (ref 150–400)
RBC: 4.13 MIL/uL (ref 3.87–5.11)
RDW: 15.9 % — ABNORMAL HIGH (ref 11.5–15.5)
WBC: 7.8 10*3/uL (ref 4.0–10.5)
nRBC: 0 % (ref 0.0–0.2)

## 2022-02-03 LAB — GLUCOSE, CAPILLARY
Glucose-Capillary: 133 mg/dL — ABNORMAL HIGH (ref 70–99)
Glucose-Capillary: 166 mg/dL — ABNORMAL HIGH (ref 70–99)

## 2022-02-03 MED ORDER — OXYCODONE HCL 5 MG PO TABS: 5.0000 mg | ORAL_TABLET | Freq: Four times a day (QID) | ORAL | 0 refills | Status: DC | PRN

## 2022-02-03 MED ORDER — RIVAROXABAN 10 MG PO TABS: ORAL_TABLET | ORAL | 0 refills | Status: DC

## 2022-02-03 MED ORDER — METHOCARBAMOL 500 MG PO TABS: 500.0000 mg | ORAL_TABLET | Freq: Four times a day (QID) | ORAL | 0 refills | Status: DC | PRN

## 2022-02-03 MED ORDER — TRAMADOL HCL 50 MG PO TABS: 50.0000 mg | ORAL_TABLET | Freq: Four times a day (QID) | ORAL | 0 refills | Status: DC | PRN

## 2022-02-03 NOTE — Progress Notes (Signed)
Physical Therapy Treatment ?Patient Details ?Name: Lauren Lopez ?MRN: [id number] ?DOB: 08-11-1951 ?Today's Date: 02/03/2022 ? ? ?History of Present Illness 37 yoo female, S/P RTKA 02/02/22 ? ?  ?PT Comments  ? ? POD # 1 pm session ?Assisted with amb to bathroom again.  General transfer comment: 25% cues for hand and right leg position.  also assisted with a toilet tranfer requiring increased time and VC's for safety/fall prevention. General Gait Details: 25% cues for posture, sequence plus increased time to complete distance. Assisted back to bed per pt request to rest before car ride home.  Verbally reviewed HEP.  Addressed all mobility questions, discussed appropriate activity, educated on use of ICE.  Pt ready for D/C to home. ?  ?Recommendations for follow up therapy are one component of a multi-disciplinary discharge planning process, led by the attending physician.  Recommendations may be updated based on patient status, additional functional criteria and insurance authorization. ? ?Follow Up Recommendations ? Follow physician's recommendations for discharge plan and follow up therapies ?  ?  ?Assistance Recommended at Discharge Intermittent Supervision/Assistance  ?Patient can return home with the following A little help with walking and/or transfers;Assistance with cooking/housework;Assist for transportation;A little help with bathing/dressing/bathroom;Help with stairs or ramp for entrance ?  ?Equipment Recommendations ? Rolling walker (2 wheels)  ?  ?Recommendations for Other Services   ? ? ?  ?Precautions / Restrictions Precautions ?Precautions: Fall;Knee ?Precaution Comments: instructed no pillow under knee ?Restrictions ?Weight Bearing Restrictions: No  ?  ? ?Mobility ? Bed Mobility ?Overal bed mobility: Needs Assistance ?Bed Mobility: Sit to Supine ?  ?  ?  ?  ?  ?General bed mobility comments: assisted back to bed per pt request to rest before D/C/car ride. ?  ? ?Transfers ?Overall transfer level:  Needs assistance ?Equipment used: Rolling walker (2 wheels) ?Transfers: Sit to/from Stand ?Sit to Stand: Min guard, Min assist ?  ?  ?  ?  ?  ?General transfer comment: 25% cues for hand and right leg position.  also assisted with a toilet tranfer requiring increased time and VC's for safety/fall prevention. ?  ? ?Ambulation/Gait ?Ambulation/Gait assistance: Min guard, Supervision ?Gait Distance (Feet): 26 Feet ?Assistive device: Rolling walker (2 wheels) ?Gait Pattern/deviations: Step-to pattern, Antalgic, Decreased step length - right ?Gait velocity: decr ?  ?  ?General Gait Details: 25% cues for posture, sequence plus increased time to complete distance. ? ? ?Stairs ?  ?  ?  ?  ?  ? ? ?Wheelchair Mobility ?  ? ?Modified Rankin (Stroke Patients Only) ?  ? ? ?  ?Balance   ?  ?  ?  ?  ?  ?  ?  ?  ?  ?  ?  ?  ?  ?  ?  ?  ?  ?  ?  ? ?  ?Cognition Arousal/Alertness: Awake/alert ?Behavior During Therapy: [facility] for tasks assessed/performed ?Overall Cognitive Status: Within Functional Limits for tasks assessed ?  ?  ?  ?  ?  ?  ?  ?  ?  ?  ?  ?  ?  ?  ?  ?  ?General Comments: AxO x 3 very pleasant ?  ?  ? ?  ?Exercises   ? ?  ?General Comments   ?  ?  ? ?Pertinent Vitals/Pain Pain Assessment ?Pain Assessment: 0-10 ?Pain Score: 5  ?Pain Location: right knee ?Pain Descriptors / Indicators: Aching, Grimacing, Dull, Operative site guarding ?Pain Intervention(s): Monitored during session, Premedicated before  session, Repositioned, Ice applied  ? ? ?Home Living   ?  ?  ?  ?  ?  ?  ?  ?  ?  ?   ?  ?Prior Function    ?  ?  ?   ? ?PT Goals (current goals can now be found in the care plan section) Progress towards PT goals: Progressing toward goals ? ?  ?Frequency ? ? ? 7X/week ? ? ? ?  ?PT Plan Current plan remains appropriate  ? ? ?Co-evaluation   ?  ?  ?  ?  ? ?  ?AM-PAC PT "6 Clicks" Mobility   ?Outcome Measure ? Help needed turning from your back to your side while in a flat bed without using bedrails?: A Little ?Help needed  moving from lying on your back to sitting on the side of a flat bed without using bedrails?: A Little ?Help needed moving to and from a bed to a chair (including a wheelchair)?: A Little ?Help needed standing up from a chair using your arms (e.g., wheelchair or bedside chair)?: A Little ?Help needed to walk in hospital room?: A Little ?Help needed climbing 3-5 steps with a railing? : A Lot ?6 Click Score: 17 ? ?  ?End of Session Equipment Utilized During Treatment: Gait belt ?Activity Tolerance: Patient tolerated treatment well ?Patient left: in bed ?Nurse Communication: Mobility status ?PT Visit Diagnosis: Unsteadiness on feet (R26.81);Difficulty in walking, not elsewhere classified (R26.2);Pain ?Pain - Right/Left: Right ?Pain - part of body: Knee ?  ? ? ?Time: 4496-7591 ?PT Time Calculation (min) (ACUTE ONLY): 24 min ? ?Charges:  $Gait Training: 8-22 mins ?$Therapeutic Activity: 8-22 mins          ?          ? ?Rica Koyanagi  PTA ?Acute  Rehabilitation Services ?Pager      [phone] ?Office      [phone] ? ? ? ?

## 2022-02-03 NOTE — TOC Transition Note (Signed)
Transition of Care (TOC) - CM/SW Discharge Note ? ?Patient Details  ?Name: Lauren Lopez ?MRN: [id number] ?Date of Birth: 1951-08-10 ? ?Transition of Care (TOC) CM/SW Contact:  ?Sherie Don, LCSW ?Phone Number: ?02/03/2022, 9:52 AM ? ?Clinical Narrative: Patient is expected to discharge home after working with PT. CSW met with patient to confirm discharge plan and needs. Patient will discharge home with OPPT at [facility]. Patient will need a rolling walker, which [employer] delivered to patient's room. TOC signing off. ? ?Final next level of care: OP Rehab ?Barriers to Discharge: No Barriers Identified ? ?Patient Goals and CMS Choice ?Patient states their goals for this hospitalization and ongoing recovery are:: Discharge home with OPPT at [facility] ?CMS Medicare.gov Compare Post Acute Care list provided to:: Patient ?Choice offered to / list presented to : Patient ? ?Discharge Plan and Services       ?DME Arranged: Walker rolling ?DME Agency: [employer] ?Representative spoke with at DME Agency: Prearranged in orthopedist's office ?HH Arranged: NA ?[place] Agency: NA ? ?Readmission Risk Interventions ?No flowsheet data found. ? ?

## 2022-02-03 NOTE — Progress Notes (Signed)
Physical Therapy Treatment ?Patient Details ?Name: Lauren Lopez ?MRN: [id number] ?DOB: October 12, 1951 ?Today's Date: 02/03/2022 ? ? ?History of Present Illness 55 yoo female, S/P RTKA 02/02/22 ? ?  ?PT Comments  ? ? POD # 1 am session ?Pt was already OOB in recliner.  Assisted with amb to bathroom required increased time.  General transfer comment: 25% cues for hand and right leg position.  also assisted with a toilet tranfer requiring increased time and VC's for safety/fall prevention.General Gait Details: 25% cues for posture, sequence plus increased time to complete distance. Performed a few TE's followed by ICE.  Pt will need another PT session to increase amb distance and complete HEP education. ?  ?Recommendations for follow up therapy are one component of a multi-disciplinary discharge planning process, led by the attending physician.  Recommendations may be updated based on patient status, additional functional criteria and insurance authorization. ? ?Follow Up Recommendations ? Follow physician's recommendations for discharge plan and follow up therapies ?  ?  ?Assistance Recommended at Discharge Intermittent Supervision/Assistance  ?Patient can return home with the following A little help with walking and/or transfers;Assistance with cooking/housework;Assist for transportation;A little help with bathing/dressing/bathroom;Help with stairs or ramp for entrance ?  ?Equipment Recommendations ? Rolling walker (2 wheels)  ?  ?Recommendations for Other Services   ? ? ?  ?Precautions / Restrictions Precautions ?Precautions: Fall;Knee ?Precaution Comments: instructed no pillow under knee ?Restrictions ?Weight Bearing Restrictions: No  ?  ? ?Mobility ? Bed Mobility ?  ?  ?  ?  ?  ?  ?  ?General bed mobility comments: OOB in recliner ?  ? ?Transfers ?Overall transfer level: Needs assistance ?Equipment used: Rolling walker (2 wheels) ?Transfers: Sit to/from Stand ?Sit to Stand: Min guard, Min assist ?  ?  ?  ?  ?   ?General transfer comment: 25% cues for hand and right leg position.  also assisted with a toilet tranfer requiring increased time and VC's for safety/fall prevention. ?  ? ?Ambulation/Gait ?Ambulation/Gait assistance: Min guard, Supervision ?Gait Distance (Feet): 22 Feet (11 x 2 to and from bathroom) ?Assistive device: Rolling walker (2 wheels) ?Gait Pattern/deviations: Step-to pattern, Antalgic, Decreased step length - right ?Gait velocity: decr ?  ?  ?General Gait Details: 25% cues for posture, sequence plus increased time to complete distance. ? ? ?Stairs ?  ?  ?  ?  ?  ? ? ?Wheelchair Mobility ?  ? ?Modified Rankin (Stroke Patients Only) ?  ? ? ?  ?Balance   ?  ?  ?  ?  ?  ?  ?  ?  ?  ?  ?  ?  ?  ?  ?  ?  ?  ?  ?  ? ?  ?Cognition Arousal/Alertness: Awake/alert ?Behavior During Therapy: [facility] for tasks assessed/performed ?Overall Cognitive Status: Within Functional Limits for tasks assessed ?  ?  ?  ?  ?  ?  ?  ?  ?  ?  ?  ?  ?  ?  ?  ?  ?General Comments: AxO x 3 very pleasant ?  ?  ? ?  ?Exercises  20 reps AP ?10 reps knee presses ?5 reps HS ? ?  ?General Comments   ?  ?  ? ?Pertinent Vitals/Pain Pain Assessment ?Pain Assessment: 0-10 ?Pain Score: 5  ?Pain Location: right knee ?Pain Descriptors / Indicators: Aching, Grimacing, Dull, Operative site guarding ?Pain Intervention(s): Monitored during session, Premedicated before session, Repositioned, Ice applied  ? ? ?  Home Living   ?  ?  ?  ?  ?  ?  ?  ?  ?  ?   ?  ?Prior Function    ?  ?  ?   ? ?PT Goals (current goals can now be found in the care plan section) Progress towards PT goals: Progressing toward goals ? ?  ?Frequency ? ? ? 7X/week ? ? ? ?  ?PT Plan Current plan remains appropriate  ? ? ?Co-evaluation   ?  ?  ?  ?  ? ?  ?AM-PAC PT "6 Clicks" Mobility   ?Outcome Measure ? Help needed turning from your back to your side while in a flat bed without using bedrails?: A Little ?Help needed moving from lying on your back to sitting on the side of a flat bed  without using bedrails?: A Little ?Help needed moving to and from a bed to a chair (including a wheelchair)?: A Little ?Help needed standing up from a chair using your arms (e.g., wheelchair or bedside chair)?: A Little ?Help needed to walk in hospital room?: A Little ?Help needed climbing 3-5 steps with a railing? : A Lot ?6 Click Score: 17 ? ?  ?End of Session Equipment Utilized During Treatment: Gait belt ?Activity Tolerance: Patient tolerated treatment well ?Patient left: in chair;with call bell/phone within reach;with family/visitor present;with chair alarm set ?Nurse Communication: Mobility status ?PT Visit Diagnosis: Unsteadiness on feet (R26.81);Difficulty in walking, not elsewhere classified (R26.2);Pain ?Pain - Right/Left: Right ?Pain - part of body: Knee ?  ? ? ?Time: 0102-7253 ?PT Time Calculation (min) (ACUTE ONLY): 42 min ? ?Charges:  $Gait Training: 8-22 mins ?$Therapeutic Activity: 8-22 mins          ?          ? ?{Jhania Etherington  PTA ?Acute  Rehabilitation Services ?Pager      [phone] ?Office      [phone] ? ?

## 2022-02-03 NOTE — Progress Notes (Signed)
? ?Subjective: ?1 Day Post-Op Procedure(s) (LRB): ?TOTAL KNEE ARTHROPLASTY (Right) ?Patient reports pain as mild.   ?Patient seen in rounds by Dr. Wynelle Link. ?Patient is well, and has had no acute complaints or problems. Denies SOB, chest pain, or calf pain. No acute overnight events. Ambulated 10 feet with therapy yesterday. Will continue therapy today.  ? ? ?Objective: ?Vital signs in last 24 hours: ?Temp:  [96.9 ?F (36.1 ?C)-97.9 ?F (36.6 ?C)] 97.9 ?F (36.6 ?C) (03/07 0440) ?Pulse Rate:  [58-83] 61 (03/07 0440) ?Resp:  [12-18] 18 (03/07 0440) ?BP: (126-149)/(67-83) 126/83 (03/07 0440) ?SpO2:  [100 %] 100 % (03/07 0440) ? ?Intake/Output from previous day: ? ?Intake/Output Summary (Last 24 hours) at 02/03/2022 0823 ?Last data filed at 02/03/2022 0600 ?Gross per 24 hour  ?Intake 3675.87 ml  ?Output 1750 ml  ?Net 1925.87 ml  ?  ? ?Intake/Output this shift: ?No intake/output data recorded. ? ?Labs: ?Recent Labs  ?  02/03/22 ?0332  ?HGB 11.0*  ? ?Recent Labs  ?  02/03/22 ?0332  ?WBC 7.8  ?RBC 4.13  ?HCT 36.7  ?PLT 308  ? ?Recent Labs  ?  02/03/22 ?0332  ?NA 133*  ?K 3.6  ?CL 104  ?CO2 24  ?BUN 13  ?CREATININE 0.72  ?GLUCOSE 148*  ?CALCIUM 8.4*  ? ?No results for input(s): LABPT, INR in the last 72 hours. ? ?Exam: ?General - Patient is Alert and Oriented ?Extremity - Neurologically intact ?Neurovascular intact ?Intact pulses distally ?Dorsiflexion/Plantar flexion intact ?Dressing - dressing C/D/I ?Motor Function - intact, moving foot and toes well on exam.  ? ?Past Medical History:  ?Diagnosis Date  ? Arthritis   ? "right hand, back" (04/03/2016)  ? Asthma   ? Bilateral carpal tunnel syndrome   ? Chronic lower back pain   ? GERD (gastroesophageal reflux disease)   ? Hyperlipidemia   ? Hypertension   ? Multiple allergies   ? Pneumonia 03/2012  ? Positive PPD   ? "they told me it wasn't positive; I was allergic to the test itself"  ? Right rotator cuff tear 01/20/2013  ? Type II diabetes mellitus ([place])   ? 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 Vitamin deficiency   ? Wears dentures   ? upper  ? Wears glasses   ? ? ?Assessment/Plan: ?1 Day Post-Op Procedure(s) (LRB): ?TOTAL KNEE ARTHROPLASTY (Right) ?Principal Problem: ?  OA (osteoarthritis) of knee ? ?Estimated body mass index is 34.44 kg/m? as calculated from the following: ?  Height as of this encounter: '5\' 3"'$  (1.6 m). ?  Weight as of this encounter: 88.2 kg. ?Up with therapy ? ? ?Patient's anticipated LOS is less than 2 midnights, meeting these requirements: ?- Younger than 63 ?- Lives within 1 hour of care ?- Has a competent adult at home to recover with post-op recover ?- NO history of ? - Chronic pain requiring opiods ? - Diabetes ? - Coronary Artery Disease ? - Heart failure ? - Heart attack ? - Stroke ? - DVT/VTE ? - Cardiac arrhythmia ? - Respiratory Failure/COPD ? - Renal failure ? - Anemia ? - Advanced Liver disease ? ?  ? ?DVT Prophylaxis - Xarelto and TED hose ?Weight bearing as tolerated. ?Continue therapy. ? ?Plan is to go Home after hospital stay. ? ?Plan for two sessions with PT this morning, and if meeting goals, will plan for discharge this afternoon.  ? ?Patient to follow up in two weeks with Dr. Wynelle Link in clinic.  ? ?The PDMP database was reviewed  today prior to any opioid medications being prescribed to this patient.. ? ? ?Fenton Foy, MBA, PA-C ?Orthopedic Surgery ?02/03/2022, 8:23 AM  ?

## 2022-02-05 DIAGNOSIS — M25661 Stiffness of right knee, not elsewhere classified: Secondary | ICD-10-CM | POA: Diagnosis not present

## 2022-02-05 DIAGNOSIS — M25561 Pain in right knee: Secondary | ICD-10-CM | POA: Diagnosis not present

## 2022-02-09 DIAGNOSIS — M25661 Stiffness of right knee, not elsewhere classified: Secondary | ICD-10-CM | POA: Diagnosis not present

## 2022-02-09 DIAGNOSIS — M25561 Pain in right knee: Secondary | ICD-10-CM | POA: Diagnosis not present

## 2022-02-11 DIAGNOSIS — M25661 Stiffness of right knee, not elsewhere classified: Secondary | ICD-10-CM | POA: Diagnosis not present

## 2022-02-11 DIAGNOSIS — M25561 Pain in right knee: Secondary | ICD-10-CM | POA: Diagnosis not present

## 2022-02-13 DIAGNOSIS — M25561 Pain in right knee: Secondary | ICD-10-CM | POA: Diagnosis not present

## 2022-02-13 DIAGNOSIS — M25661 Stiffness of right knee, not elsewhere classified: Secondary | ICD-10-CM | POA: Diagnosis not present

## 2022-02-16 DIAGNOSIS — M25561 Pain in right knee: Secondary | ICD-10-CM | POA: Diagnosis not present

## 2022-02-16 DIAGNOSIS — M25661 Stiffness of right knee, not elsewhere classified: Secondary | ICD-10-CM | POA: Diagnosis not present

## 2022-02-18 DIAGNOSIS — M25661 Stiffness of right knee, not elsewhere classified: Secondary | ICD-10-CM | POA: Diagnosis not present

## 2022-02-18 DIAGNOSIS — M25561 Pain in right knee: Secondary | ICD-10-CM | POA: Diagnosis not present

## 2022-02-20 ENCOUNTER — Encounter (HOSPITAL_COMMUNITY): Payer: Self-pay | Admitting: Internal Medicine

## 2022-02-20 ENCOUNTER — Other Ambulatory Visit: Payer: Self-pay

## 2022-02-20 ENCOUNTER — Inpatient Hospital Stay (HOSPITAL_COMMUNITY)
Admission: EM | Admit: 2022-02-20 | Discharge: 2022-02-24 | DRG: 202 | Disposition: A | Discharge status: Home / Self Care | Payer: Medicare HMO | Attending: Internal Medicine | Admitting: Internal Medicine

## 2022-02-20 ENCOUNTER — Emergency Department (HOSPITAL_COMMUNITY): Payer: Medicare HMO

## 2022-02-20 DIAGNOSIS — I700 Atherosclerosis of aorta: Secondary | ICD-10-CM | POA: Diagnosis not present

## 2022-02-20 DIAGNOSIS — Z96659 Presence of unspecified artificial knee joint: Secondary | ICD-10-CM

## 2022-02-20 DIAGNOSIS — Z79890 Hormone replacement therapy (postmenopausal): Secondary | ICD-10-CM

## 2022-02-20 DIAGNOSIS — Z8616 Personal history of COVID-19: Secondary | ICD-10-CM

## 2022-02-20 DIAGNOSIS — J45909 Unspecified asthma, uncomplicated: Secondary | ICD-10-CM | POA: Diagnosis not present

## 2022-02-20 DIAGNOSIS — N182 Chronic kidney disease, stage 2 (mild): Secondary | ICD-10-CM | POA: Diagnosis present

## 2022-02-20 DIAGNOSIS — Z713 Dietary counseling and surveillance: Secondary | ICD-10-CM

## 2022-02-20 DIAGNOSIS — G8929 Other chronic pain: Secondary | ICD-10-CM | POA: Diagnosis present

## 2022-02-20 DIAGNOSIS — E785 Hyperlipidemia, unspecified: Secondary | ICD-10-CM

## 2022-02-20 DIAGNOSIS — R000 Tachycardia, unspecified: Secondary | ICD-10-CM | POA: Diagnosis not present

## 2022-02-20 DIAGNOSIS — Z96651 Presence of right artificial knee joint: Secondary | ICD-10-CM | POA: Diagnosis present

## 2022-02-20 DIAGNOSIS — J929 Pleural plaque without asbestos: Secondary | ICD-10-CM | POA: Diagnosis not present

## 2022-02-20 DIAGNOSIS — J9811 Atelectasis: Secondary | ICD-10-CM | POA: Diagnosis not present

## 2022-02-20 DIAGNOSIS — Z7984 Long term (current) use of oral hypoglycemic drugs: Secondary | ICD-10-CM

## 2022-02-20 DIAGNOSIS — E669 Obesity, unspecified: Secondary | ICD-10-CM

## 2022-02-20 DIAGNOSIS — M545 Low back pain, unspecified: Secondary | ICD-10-CM | POA: Diagnosis present

## 2022-02-20 DIAGNOSIS — I129 Hypertensive chronic kidney disease with stage 1 through stage 4 chronic kidney disease, or unspecified chronic kidney disease: Secondary | ICD-10-CM | POA: Diagnosis not present

## 2022-02-20 DIAGNOSIS — R062 Wheezing: Secondary | ICD-10-CM | POA: Diagnosis not present

## 2022-02-20 DIAGNOSIS — J4541 Moderate persistent asthma with (acute) exacerbation: Secondary | ICD-10-CM

## 2022-02-20 DIAGNOSIS — R9431 Abnormal electrocardiogram [ECG] [EKG]: Secondary | ICD-10-CM

## 2022-02-20 DIAGNOSIS — E1122 Type 2 diabetes mellitus with diabetic chronic kidney disease: Secondary | ICD-10-CM | POA: Diagnosis not present

## 2022-02-20 DIAGNOSIS — Z743 Need for continuous supervision: Secondary | ICD-10-CM | POA: Diagnosis not present

## 2022-02-20 DIAGNOSIS — Z79891 Long term (current) use of opiate analgesic: Secondary | ICD-10-CM

## 2022-02-20 DIAGNOSIS — I471 Supraventricular tachycardia: Secondary | ICD-10-CM | POA: Diagnosis not present

## 2022-02-20 DIAGNOSIS — I10 Essential (primary) hypertension: Secondary | ICD-10-CM

## 2022-02-20 DIAGNOSIS — Z9079 Acquired absence of other genital organ(s): Secondary | ICD-10-CM | POA: Diagnosis not present

## 2022-02-20 DIAGNOSIS — Z66 Do not resuscitate: Secondary | ICD-10-CM

## 2022-02-20 DIAGNOSIS — E876 Hypokalemia: Secondary | ICD-10-CM | POA: Diagnosis present

## 2022-02-20 DIAGNOSIS — K219 Gastro-esophageal reflux disease without esophagitis: Secondary | ICD-10-CM | POA: Diagnosis present

## 2022-02-20 DIAGNOSIS — E1169 Type 2 diabetes mellitus with other specified complication: Secondary | ICD-10-CM | POA: Diagnosis not present

## 2022-02-20 DIAGNOSIS — I499 Cardiac arrhythmia, unspecified: Secondary | ICD-10-CM | POA: Diagnosis not present

## 2022-02-20 DIAGNOSIS — Z6833 Body mass index (BMI) 33.0-33.9, adult: Secondary | ICD-10-CM

## 2022-02-20 DIAGNOSIS — Z8249 Family history of ischemic heart disease and other diseases of the circulatory system: Secondary | ICD-10-CM

## 2022-02-20 DIAGNOSIS — Z7901 Long term (current) use of anticoagulants: Secondary | ICD-10-CM

## 2022-02-20 DIAGNOSIS — M1990 Unspecified osteoarthritis, unspecified site: Secondary | ICD-10-CM | POA: Diagnosis present

## 2022-02-20 DIAGNOSIS — J45901 Unspecified asthma with (acute) exacerbation: Principal | ICD-10-CM | POA: Diagnosis present

## 2022-02-20 DIAGNOSIS — Z79899 Other long term (current) drug therapy: Secondary | ICD-10-CM

## 2022-02-20 DIAGNOSIS — Z90722 Acquired absence of ovaries, bilateral: Secondary | ICD-10-CM | POA: Diagnosis not present

## 2022-02-20 DIAGNOSIS — Z888 Allergy status to other drugs, medicaments and biological substances status: Secondary | ICD-10-CM | POA: Diagnosis not present

## 2022-02-20 DIAGNOSIS — Z20822 Contact with and (suspected) exposure to covid-19: Secondary | ICD-10-CM | POA: Diagnosis not present

## 2022-02-20 DIAGNOSIS — Z833 Family history of diabetes mellitus: Secondary | ICD-10-CM

## 2022-02-20 DIAGNOSIS — Z90710 Acquired absence of both cervix and uterus: Secondary | ICD-10-CM | POA: Diagnosis not present

## 2022-02-20 DIAGNOSIS — R0609 Other forms of dyspnea: Secondary | ICD-10-CM | POA: Diagnosis not present

## 2022-02-20 DIAGNOSIS — E66811 Obesity, class 1: Secondary | ICD-10-CM | POA: Diagnosis present

## 2022-02-20 DIAGNOSIS — G894 Chronic pain syndrome: Secondary | ICD-10-CM | POA: Diagnosis present

## 2022-02-20 DIAGNOSIS — R0689 Other abnormalities of breathing: Secondary | ICD-10-CM | POA: Diagnosis not present

## 2022-02-20 DIAGNOSIS — R0602 Shortness of breath: Secondary | ICD-10-CM | POA: Diagnosis not present

## 2022-02-20 DIAGNOSIS — H409 Unspecified glaucoma: Secondary | ICD-10-CM | POA: Diagnosis present

## 2022-02-20 HISTORY — DX: Unspecified asthma with (acute) exacerbation: J45.901

## 2022-02-20 LAB — RESP PANEL BY RT-PCR (FLU A&B, COVID) ARPGX2
Influenza A by PCR: NEGATIVE
Influenza B by PCR: NEGATIVE
SARS Coronavirus 2 by RT PCR: NEGATIVE

## 2022-02-20 LAB — CBC
HCT: 33.3 % — ABNORMAL LOW (ref 36.0–46.0)
Hemoglobin: 10.2 g/dL — ABNORMAL LOW (ref 12.0–15.0)
MCH: 27.1 pg (ref 26.0–34.0)
MCHC: 30.6 g/dL (ref 30.0–36.0)
MCV: 88.6 fL (ref 80.0–100.0)
Platelets: 470 10*3/uL — ABNORMAL HIGH (ref 150–400)
RBC: 3.76 MIL/uL — ABNORMAL LOW (ref 3.87–5.11)
RDW: 16.3 % — ABNORMAL HIGH (ref 11.5–15.5)
WBC: 10.9 10*3/uL — ABNORMAL HIGH (ref 4.0–10.5)
nRBC: 0 % (ref 0.0–0.2)

## 2022-02-20 LAB — BASIC METABOLIC PANEL
Anion gap: 15 (ref 5–15)
BUN: 10 mg/dL (ref 8–23)
CO2: 22 mmol/L (ref 22–32)
Calcium: 9.4 mg/dL (ref 8.9–10.3)
Chloride: 101 mmol/L (ref 98–111)
Creatinine, Ser: 0.96 mg/dL (ref 0.44–1.00)
GFR, Estimated: >60 mL/min (ref >60)
Glucose, Bld: 175 mg/dL — ABNORMAL HIGH (ref 70–99)
Potassium: 2.5 mmol/L — CL (ref 3.5–5.1)
Sodium: 138 mmol/L (ref 135–145)

## 2022-02-20 LAB — BRAIN NATRIURETIC PEPTIDE: B Natriuretic Peptide: 8.6 pg/mL (ref 0.0–100.0)

## 2022-02-20 LAB — TROPONIN I (HIGH SENSITIVITY)
Troponin I (High Sensitivity): 4 ng/L (ref <18)
Troponin I (High Sensitivity): 3 ng/L (ref <18)

## 2022-02-20 LAB — CBG MONITORING, ED: Glucose-Capillary: 236 mg/dL — ABNORMAL HIGH (ref 70–99)

## 2022-02-20 LAB — MAGNESIUM: Magnesium: 1.8 mg/dL (ref 1.7–2.4)

## 2022-02-20 LAB — GLUCOSE, CAPILLARY
Glucose-Capillary: 140 mg/dL — ABNORMAL HIGH (ref 70–99)
Glucose-Capillary: 171 mg/dL — ABNORMAL HIGH (ref 70–99)

## 2022-02-20 MED ORDER — PANTOPRAZOLE SODIUM 40 MG PO TBEC
40.0000 mg | DELAYED_RELEASE_TABLET | Freq: Every day | ORAL | Status: DC
Start: 2022-02-20 — End: 2022-02-24
  Administered 2022-02-20 – 2022-02-24 (×5): 40 mg via ORAL
  Filled 2022-02-20 (×5): qty 1

## 2022-02-20 MED ORDER — FLUTICASONE PROPIONATE 50 MCG/ACT NA SUSP
2.0000 | Freq: Every day | NASAL | Status: DC
Start: 2022-02-20 — End: 2022-02-24
  Administered 2022-02-20 – 2022-02-24 (×5): 2 via NASAL
  Filled 2022-02-20: qty 16

## 2022-02-20 MED ORDER — IOHEXOL 350 MG/ML SOLN
100.0000 mL | Freq: Once | INTRAVENOUS | Status: AC | PRN
  Administered 2022-02-20: 100 mL via INTRAVENOUS

## 2022-02-20 MED ORDER — LACTATED RINGERS IV BOLUS
500.0000 mL | Freq: Once | INTRAVENOUS | Status: AC
  Administered 2022-02-20: 500 mL via INTRAVENOUS

## 2022-02-20 MED ORDER — HYDRALAZINE HCL 20 MG/ML IJ SOLN: 5.0000 mg | INTRAMUSCULAR | Status: DC | PRN

## 2022-02-20 MED ORDER — HYDROCODONE-ACETAMINOPHEN 5-325 MG PO TABS: 1.0000 | ORAL_TABLET | ORAL | Status: DC | PRN

## 2022-02-20 MED ORDER — ROSUVASTATIN CALCIUM 20 MG PO TABS
20.0000 mg | ORAL_TABLET | Freq: Every day | ORAL | Status: DC
  Administered 2022-02-20 – 2022-02-24 (×5): 20 mg via ORAL
  Filled 2022-02-20 (×5): qty 1

## 2022-02-20 MED ORDER — SODIUM CHLORIDE 0.9% FLUSH
3.0000 mL | Freq: Two times a day (BID) | INTRAVENOUS | Status: DC
  Administered 2022-02-20 – 2022-02-24 (×8): 3 mL via INTRAVENOUS

## 2022-02-20 MED ORDER — PREDNISONE 20 MG PO TABS: 40.0000 mg | ORAL_TABLET | Freq: Every day | ORAL | Status: DC

## 2022-02-20 MED ORDER — MAGNESIUM SULFATE 2 GM/50ML IV SOLN
2.0000 g | Freq: Once | INTRAVENOUS | Status: AC
  Administered 2022-02-20: 2 g via INTRAVENOUS
  Filled 2022-02-20: qty 50

## 2022-02-20 MED ORDER — ACETAMINOPHEN 650 MG RE SUPP: 650.0000 mg | Freq: Four times a day (QID) | RECTAL | Status: DC | PRN

## 2022-02-20 MED ORDER — ACETAMINOPHEN 325 MG PO TABS: 650.0000 mg | ORAL_TABLET | Freq: Four times a day (QID) | ORAL | Status: DC | PRN

## 2022-02-20 MED ORDER — ONDANSETRON HCL 4 MG/2ML IJ SOLN
4.0000 mg | Freq: Four times a day (QID) | INTRAMUSCULAR | Status: DC | PRN
Start: 2022-02-20 — End: 2022-02-24

## 2022-02-20 MED ORDER — DILTIAZEM HCL-DEXTROSE 125-5 MG/125ML-% IV SOLN (PREMIX): 5.0000 mg/h | INTRAVENOUS | Status: DC

## 2022-02-20 MED ORDER — ONDANSETRON HCL 4 MG PO TABS: 4.0000 mg | ORAL_TABLET | Freq: Four times a day (QID) | ORAL | Status: DC | PRN

## 2022-02-20 MED ORDER — METHYLPREDNISOLONE SODIUM SUCC 125 MG IJ SOLR
80.0000 mg | Freq: Two times a day (BID) | INTRAMUSCULAR | Status: AC
  Administered 2022-02-20 – 2022-02-21 (×2): 80 mg via INTRAVENOUS
  Filled 2022-02-20 (×2): qty 2

## 2022-02-20 MED ORDER — IPRATROPIUM-ALBUTEROL 0.5-2.5 (3) MG/3ML IN SOLN
3.0000 mL | Freq: Four times a day (QID) | RESPIRATORY_TRACT | Status: DC
  Administered 2022-02-20 – 2022-02-21 (×4): 3 mL via RESPIRATORY_TRACT
  Filled 2022-02-20 (×5): qty 3

## 2022-02-20 MED ORDER — OXYCODONE HCL 5 MG PO TABS
5.0000 mg | ORAL_TABLET | Freq: Four times a day (QID) | ORAL | Status: DC | PRN
  Administered 2022-02-20: 5 mg via ORAL
  Filled 2022-02-20: qty 1

## 2022-02-20 MED ORDER — INSULIN ASPART 100 UNIT/ML IJ SOLN: 0.0000 [IU] | Freq: Every day | INTRAMUSCULAR | Status: DC

## 2022-02-20 MED ORDER — POLYETHYLENE GLYCOL 3350 17 G PO PACK
17.0000 g | PACK | Freq: Every day | ORAL | Status: DC | PRN
  Administered 2022-02-21: 17 g via ORAL
  Filled 2022-02-20: qty 1

## 2022-02-20 MED ORDER — DOCUSATE SODIUM 100 MG PO CAPS
100.0000 mg | ORAL_CAPSULE | Freq: Two times a day (BID) | ORAL | Status: DC
  Administered 2022-02-20 – 2022-02-24 (×9): 100 mg via ORAL
  Filled 2022-02-20 (×9): qty 1

## 2022-02-20 MED ORDER — GUAIFENESIN ER 600 MG PO TB12
600.0000 mg | ORAL_TABLET | Freq: Two times a day (BID) | ORAL | Status: DC | PRN
  Administered 2022-02-20: 600 mg via ORAL
  Filled 2022-02-20: qty 1

## 2022-02-20 MED ORDER — DILTIAZEM LOAD VIA INFUSION
20.0000 mg | Freq: Once | INTRAVENOUS | Status: DC
  Filled 2022-02-20: qty 20

## 2022-02-20 MED ORDER — METHOCARBAMOL 500 MG PO TABS: 500.0000 mg | ORAL_TABLET | Freq: Four times a day (QID) | ORAL | Status: DC | PRN

## 2022-02-20 MED ORDER — LACTATED RINGERS IV BOLUS
250.0000 mL | Freq: Once | INTRAVENOUS | Status: AC
  Administered 2022-02-20: 250 mL via INTRAVENOUS

## 2022-02-20 MED ORDER — LORATADINE 10 MG PO TABS
10.0000 mg | ORAL_TABLET | Freq: Every day | ORAL | Status: DC
Start: 2022-02-20 — End: 2022-02-24
  Administered 2022-02-20 – 2022-02-24 (×5): 10 mg via ORAL
  Filled 2022-02-20 (×5): qty 1

## 2022-02-20 MED ORDER — POTASSIUM CHLORIDE 10 MEQ/100ML IV SOLN
10.0000 meq | INTRAVENOUS | Status: AC
  Administered 2022-02-20 (×3): 10 meq via INTRAVENOUS
  Filled 2022-02-20 (×2): qty 100

## 2022-02-20 MED ORDER — TRAMADOL HCL 50 MG PO TABS: 50.0000 mg | ORAL_TABLET | Freq: Four times a day (QID) | ORAL | Status: DC | PRN

## 2022-02-20 MED ORDER — BISACODYL 5 MG PO TBEC: 5.0000 mg | DELAYED_RELEASE_TABLET | Freq: Every day | ORAL | Status: DC | PRN

## 2022-02-20 MED ORDER — RIVAROXABAN 10 MG PO TABS
10.0000 mg | ORAL_TABLET | Freq: Every day | ORAL | Status: AC
  Administered 2022-02-20 – 2022-02-24 (×5): 10 mg via ORAL
  Filled 2022-02-20 (×5): qty 1

## 2022-02-20 MED ORDER — ALBUTEROL SULFATE (2.5 MG/3ML) 0.083% IN NEBU: 2.5000 mg | INHALATION_SOLUTION | RESPIRATORY_TRACT | Status: DC | PRN

## 2022-02-20 MED ORDER — MOMETASONE FURO-FORMOTEROL FUM 200-5 MCG/ACT IN AERO
2.0000 | INHALATION_SPRAY | Freq: Two times a day (BID) | RESPIRATORY_TRACT | Status: DC
Start: 2022-02-20 — End: 2022-02-24
  Administered 2022-02-20 – 2022-02-24 (×9): 2 via RESPIRATORY_TRACT
  Filled 2022-02-20: qty 8.8

## 2022-02-20 MED ORDER — POTASSIUM CHLORIDE CRYS ER 20 MEQ PO TBCR
40.0000 meq | EXTENDED_RELEASE_TABLET | Freq: Once | ORAL | Status: AC
  Administered 2022-02-20: 40 meq via ORAL
  Filled 2022-02-20: qty 2

## 2022-02-20 MED ORDER — LATANOPROST 0.005 % OP SOLN
1.0000 [drp] | Freq: Every day | OPHTHALMIC | Status: DC
  Administered 2022-02-20 – 2022-02-23 (×4): 1 [drp] via OPHTHALMIC
  Filled 2022-02-20: qty 2.5

## 2022-02-20 MED ORDER — MORPHINE SULFATE (PF) 2 MG/ML IV SOLN: 2.0000 mg | INTRAVENOUS | Status: DC | PRN

## 2022-02-20 MED ORDER — INSULIN ASPART 100 UNIT/ML IJ SOLN
0.0000 [IU] | Freq: Three times a day (TID) | INTRAMUSCULAR | Status: DC
  Administered 2022-02-20: 5 [IU] via SUBCUTANEOUS
  Administered 2022-02-20: 2 [IU] via SUBCUTANEOUS
  Administered 2022-02-21 (×3): 3 [IU] via SUBCUTANEOUS
  Administered 2022-02-22: 2 [IU] via SUBCUTANEOUS
  Administered 2022-02-22: 8 [IU] via SUBCUTANEOUS
  Administered 2022-02-22 – 2022-02-23 (×2): 5 [IU] via SUBCUTANEOUS
  Administered 2022-02-23: 2 [IU] via SUBCUTANEOUS
  Administered 2022-02-23: 3 [IU] via SUBCUTANEOUS
  Administered 2022-02-24: 8 [IU] via SUBCUTANEOUS
  Administered 2022-02-24: 2 [IU] via SUBCUTANEOUS

## 2022-02-20 NOTE — H&P (Addendum)
?History and Physical  ? ? ?Patient: Lauren Lopez OKH:[id number] DOB: May 27, 1951 ?DOA: 02/20/2022 ?DOS: the patient was seen and examined on 02/20/2022 ?PCP: Unk Pinto, MD  ?Patient coming from: Home - lives with husband; NOK: Daughter, [phone] ? ? ?Chief Complaint: SOB ? ?HPI: Lauren Lopez is a 71 y.o. female with medical history significant of HTN; HLD; DM; chronic back pain; and asthma presenting with SOB.  She had knee replacement 3 weeks ago.  She had COVID in January and has continued to cough.  Wednesday, she had a coughing spell and then she had another last night.  She reported difficulty breathing.  She was placed on BIPAP but appeared to improve and was taken off.  She tried to go to the bathroom and had increased WOB and so was placed back on BIPAP.  No fever.  No URI symptoms other than as mentioned.  No sick contacts. ? ? ? ?ER Course:  [place], per Dr. Sidney Ace: ? ?71 year old female with H/oh DM 2, HTN, HL, asthma and GERD, coming with acute asthma exacerbation with acute respiratory failure requiring BiPAP, with hypokalemia of 2.5, status post Solu-Medrol, nebulized bronchodilator therapy and magnesium with improvement initially however with atrial tachycardia that later improved also,  however when patient walked to the bathroom she had recurrent worsening dyspnea and wheezing again requiring BiPAP ? ? ? ? ?Review of Systems: unable to review all systems due to the inability of the patient to answer questions. Due to BIPAP ?Past Medical History:  ?Diagnosis Date  ? Arthritis   ? "right hand, back" (04/03/2016)  ? Asthma   ? takes weekly allergy shots, none in 5-6 weeks due to knee surgery  ? Bilateral carpal tunnel syndrome   ? Chronic lower back pain   ? GERD (gastroesophageal reflux disease)   ? Hyperlipidemia   ? Hypertension   ? Multiple allergies   ? Pneumonia 03/2012  ? Positive PPD   ? "they told me it wasn't positive; I was allergic to the test itself"  ? Right rotator cuff  tear 01/20/2013  ? Type II diabetes mellitus ([place])   ? Vasculitis ([place]) 04/03/2016  ? Vitamin deficiency   ? Wears dentures   ? upper  ? Wears glasses   ? ?Past Surgical History:  ?Procedure Laterality Date  ? ABDOMINAL HYSTERECTOMY    ? BILATERAL SALPINGOOPHORECTOMY  11/28/2010  ? open laparoscopy with adhesiolysis also  ? CARPAL TUNNEL RELEASE  07/15/2012  ? Procedure: CARPAL TUNNEL RELEASE;  Surgeon: Cammie Sickle., MD;  Location: [facility];  Service: Orthopedics;  Laterality: Left;  ? CARPAL TUNNEL RELEASE Right 05/31/2014  ? Procedure: RIGHT CARPAL TUNNEL RELEASE;  Surgeon: Cammie Sickle, MD;  Location: [facility];  Service: Orthopedics;  Laterality: Right;  ? COLONOSCOPY    ? KNEE ARTHROSCOPY Bilateral 2005-2016  ? "right-left"  ? SHOULDER ARTHROSCOPY WITH ROTATOR CUFF REPAIR AND SUBACROMIAL DECOMPRESSION Right 01/20/2013  ? Procedure: RIGHT ARTHROSCOPY SHOULDER DEBRIDEMENT LIMITED, ARTHROSCOPY SHOULDER DECOMPRESSION SUBACROMIAL PARTIAL ACROMIOPLASTY WITH CORACOACROMIAL RELEASE, ROTATOR CUFF REPAIR ;  Surgeon: Johnny Bridge, MD;  Location: [facility];  Service: Orthopedics;  Laterality: Right;  RIGHT SHOULDER SCOPE DEBRIDEMENT, ACRIOMIOPLASTY, ROTATOR CUFF REPAIR  ? TOTAL KNEE ARTHROPLASTY Right 02/02/2022  ? Procedure: TOTAL KNEE ARTHROPLASTY;  Surgeon: Gaynelle Arabian, MD;  [facility];  Service: Orthopedics;  Laterality: Right;  ? TUBAL LIGATION    ? ?Social History:  reports that she has never smoked.  She has never used smokeless tobacco. She reports that she does not drink alcohol and does not use drugs. ? ?Allergies  ?Allergen Reactions  ? Metronidazole Other (See Comments)  ?  unknown  ? Ppd [Tuberculin Purified Protein Derivative] Other (See Comments)  ?  + PPD 2013 with NEG CXR 05/2012 ?Redness and severe swelling at injection site  ? ? ?Family History  ?Problem Relation Age of Onset  ? Hypertension Mother   ? Diabetes Mother   ? Dementia Mother    ? Hypertension Father   ? Alzheimer's disease Father   ? Tongue cancer Sister   ?     smoker  ? Cirrhosis Brother   ? Hypertension Brother   ? ? ?Prior to Admission medications   ?Medication Sig Start Date End Date Taking? Authorizing Provider  ?ADVAIR HFA 230-21 MCG/ACT inhaler Inhale 2 puffs into the lungs 2 (two) times daily. 07/28/21  Yes [provider]  ?EPINEPHrine 0.3 mg/0.3 mL IJ SOAJ injection SMARTSIG:Injection IM As Directed 12/02/21  Yes [provider]  ?hydrochlorothiazide (HYDRODIURIL) 25 MG tablet TAKE 1 TABLET BY MOUTH EVERY DAY ?Patient taking differently: Take 25 mg by mouth daily. 01/21/22  Yes Liane Comber, NP  ?latanoprost (XALATAN) 0.005 % ophthalmic solution Place 1 drop into both eyes at bedtime. 11/26/21  Yes [provider]  ?metFORMIN (GLUCOPHAGE) 500 MG tablet Take 1 tab twice daily with food for diabetes. Do not increase dose due to causing diarrhea. 07/07/21  Yes Liane Comber, NP  ?methocarbamol (ROBAXIN) 500 MG tablet Take 1 tablet (500 mg total) by mouth every 6 (six) hours as needed for muscle spasms. 02/03/22  Yes Fenton Foy D, PA-C  ?mometasone (NASONEX) 50 MCG/ACT nasal spray Place 2 sprays into the nose daily as needed (allergies).   Yes [provider]  ?omeprazole (PRILOSEC) 40 MG capsule Take one capsule daily for indigestion and heartburn 07/31/21  Yes Liane Comber, NP  ?oxyCODONE (OXY IR/ROXICODONE) 5 MG immediate release tablet Take 1-2 tablets (5-10 mg total) by mouth every 6 (six) hours as needed for severe pain. 02/03/22  Yes Fenton Foy D, PA-C  ?Phenylephrine-DM-GG-APAP (MUCINEX FAST-MAX CLD FLU THRT PO) Take by mouth.   Yes [provider]  ?promethazine-dextromethorphan (PROMETHAZINE-DM) 6.25-15 MG/5ML syrup Take 5 mLs by mouth 4 (four) times daily as needed for cough. 12/29/21  Yes Magda Bernheim, NP  ?Rhubarb (ESTROVEN MENOPAUSE RELIEF PO) Take 1 capsule by mouth daily.   Yes [provider]  ?rivaroxaban  (XARELTO) 10 MG TABS tablet Take one tablet Xarelto once a day for three weeks following surgery. Then change to one baby Aspirin (81 mg) once a day for three weeks. Then discontinue Aspirin. 02/03/22  Yes Fenton Foy D, PA-C  ?rosuvastatin (CRESTOR) 20 MG tablet Take  1 tablet  Daily  for Cholesterol 07/08/21  Yes Liane Comber, NP  ?traMADol (ULTRAM) 50 MG tablet Take 1-2 tablets (50-100 mg total) by mouth every 6 (six) hours as needed for moderate pain. 02/03/22  Yes Fenton Foy D, PA-C  ?blood glucose meter kit and supplies Test sugars twice daily. Dx E11.9 01/24/19   Vladimir Crofts, PA-C  ?docusate sodium (COLACE) 100 MG capsule Take 100 mg by mouth daily. ?Patient not taking: Reported on 02/20/2022    [provider]  ?fexofenadine (ALLEGRA) 180 MG tablet Take 1 tablet (180 mg total) by mouth daily. ?Patient not taking: Reported on 02/20/2022 10/17/15   Rolene Course, PA-C  ?glucose blood (ONETOUCH ULTRA) test strip USE  TO TEST SUGAR TWICE A DAY 12/31/21   Magda Bernheim, NP  ?Lancets (ONETOUCH DELICA PLUS YHTMBP11E) [place] TEST SUGAR TWICE A DAY 07/25/19   Unk Pinto, MD  ?Probiotic Product (PROBIOTIC-10 PO) Take 1 capsule by mouth daily. ?Patient not taking: Reported on 02/20/2022    [provider]  ? ? ?Physical Exam: ?Vitals:  ? 02/20/22 1430 02/20/22 1500 02/20/22 1530 02/20/22 1621  ?BP: 126/80 94/78 134/80 125/63  ?Pulse: 87 82 (!) 110 (!) 143  ?Resp: (!) '21 14 18 18  ' ?Temp:    98.6 ?F (37 ?C)  ?TempSrc:    Oral  ?SpO2: 97% 96% 98% 94%  ? ?General:  Appears calm and comfortable and is in NAD ?Eyes:  PERRL, EOMI, normal lids, iris ?ENT:  grossly normal hearing, lips & tongue, mmm; BIPAP in place ?Neck:  no LAD, masses or thyromegaly ?Cardiovascular:  RRR, no m/r/g. No LE edema.  ?Respiratory:   CTA bilaterally with no wheezes/rales/rhonchi.  Normal respiratory effort on BIPAP. ?Abdomen:  soft, NT, ND ?Skin:  no rash or induration seen on limited exam ?Musculoskeletal:  RLE with  mild edema compared to left diffusely; bandage along entire anterior knee is C/D/O ?Psychiatric:  blunted mood and affect, speech fluent and appropriate, AOx3 ?Neurologic:  CN 2-12 grossly intact, moves

## 2022-02-20 NOTE — ED Notes (Signed)
ED TO INPATIENT HANDOFF REPORT ? ?ED Nurse Name and Phone #: Baxter Flattery, RN ? ?S ?Name/Age/Gender ?Lauren Lopez ?71 y.o. ?female ?Room/Bed: 025C/025C ? ?Code Status ?  Code Status: DNR ? ?Home/SNF/Other ?Home ?Patient oriented to: self, place, time, and situation ?Is this baseline? Yes  ? ?Triage Complete: Triage complete  ?Chief Complaint ?Acute asthma exacerbation [J45.901] ? ?Triage Note ?EMS arrival. Patient came from home. Hx of asthma. Arrives on CPAP. '125mg'$  Solumedrol, 2g Mag, '10mg'$  albuterol, '1mg'$  Atrovent given enroute. Tachy 152 HR on arrival.   ? ?Allergies ?Allergies  ?Allergen Reactions  ? Metronidazole Other (See Comments)  ?  unknown  ? Ppd [Tuberculin Purified Protein Derivative] Other (See Comments)  ?  + PPD 2013 with NEG CXR 05/2012 ?Redness and severe swelling at injection site  ? ? ?Level of Care/Admitting Diagnosis ?ED Disposition   ? ? ED Disposition  ?Admit  ? Condition  ?--  ? Comment  ?Hospital [facility] [[id number]] ? Level of Care: Progressive [102] ? Admit to Progressive based on following criteria: RESPIRATORY PROBLEMS hypoxemic/hypercapnic respiratory failure that is responsive to NIPPV (BiPAP) or High Flow Nasal Cannula (6-80 lpm). Frequent assessment/intervention, no > Q2 hrs < Q4 hrs, to maintain oxygenation and pulmonary hygiene. ? May [facility] to [facility] if equivalent level of care is available:: No ? Covid Evaluation: Confirmed COVID Negative ? Diagnosis: Acute asthma exacerbation [712458] ? Admitting Physician: Christel Mormon [[id number]] ? Attending Physician: Christel Mormon [[id number]] ? Estimated length of stay: past midnight tomorrow ? Certification:: I certify this patient will need inpatient services for at least 2 midnights ?  ?  ? ?  ? ? ?B ?Medical/Surgery History ?Past Medical History:  ?Diagnosis Date  ? Arthritis   ? "right hand, back" (04/03/2016)  ? Asthma   ? takes weekly allergy shots, none in 5-6 weeks due to knee surgery  ?  Bilateral carpal tunnel syndrome   ? Chronic lower back pain   ? GERD (gastroesophageal reflux disease)   ? Hyperlipidemia   ? Hypertension   ? Multiple allergies   ? Pneumonia 03/2012  ? Positive PPD   ? "they told me it wasn't positive; I was allergic to the test itself"  ? Right rotator cuff tear 01/20/2013  ? Type II diabetes mellitus ([place])   ? Vasculitis ([place]) 04/03/2016  ? Vitamin deficiency   ? Wears dentures   ? upper  ? Wears glasses   ? ?Past Surgical History:  ?Procedure Laterality Date  ? ABDOMINAL HYSTERECTOMY    ? BILATERAL SALPINGOOPHORECTOMY  11/28/2010  ? open laparoscopy with adhesiolysis also  ? CARPAL TUNNEL RELEASE  07/15/2012  ? Procedure: CARPAL TUNNEL RELEASE;  Surgeon: Cammie Sickle., MD;  Location: [facility];  Service: Orthopedics;  Laterality: Left;  ? CARPAL TUNNEL RELEASE Right 05/31/2014  ? Procedure: RIGHT CARPAL TUNNEL RELEASE;  Surgeon: Cammie Sickle, MD;  Location: [facility];  Service: Orthopedics;  Laterality: Right;  ? COLONOSCOPY    ? KNEE ARTHROSCOPY Bilateral 2005-2016  ? "right-left"  ? SHOULDER ARTHROSCOPY WITH ROTATOR CUFF REPAIR AND SUBACROMIAL DECOMPRESSION Right 01/20/2013  ? Procedure: RIGHT ARTHROSCOPY SHOULDER DEBRIDEMENT LIMITED, ARTHROSCOPY SHOULDER DECOMPRESSION SUBACROMIAL PARTIAL ACROMIOPLASTY WITH CORACOACROMIAL RELEASE, ROTATOR CUFF REPAIR ;  Surgeon: Johnny Bridge, MD;  Location: [facility];  Service: Orthopedics;  Laterality: Right;  RIGHT SHOULDER SCOPE DEBRIDEMENT, ACRIOMIOPLASTY, ROTATOR CUFF REPAIR  ? TOTAL KNEE ARTHROPLASTY Right  02/02/2022  ? Procedure: TOTAL KNEE ARTHROPLASTY;  Surgeon: Gaynelle Arabian, MD;  [facility];  Service: Orthopedics;  Laterality: Right;  ? TUBAL LIGATION    ?  ? ?A ?IV Location/Drains/Wounds ?Patient Lines/Drains/Airways Status   ? ? Active Line/Drains/Airways   ? ? Name Placement date Placement time Site Days  ? Peripheral IV 02/20/22 Left Antecubital 02/20/22  0230   Antecubital  less than 1  ? External Urinary Catheter 02/20/22  0800  --  less than 1  ? Incision (Closed) 02/02/22 Knee Right 02/02/22  0944  -- 18  ? ?  ?  ? ?  ? ? ?Intake/Output Last 24 hours ? ?Intake/Output Summary (Last 24 hours) at 02/20/2022 1518 ?Last data filed at 02/20/2022 1056 ?Gross per 24 hour  ?Intake 762.55 ml  ?Output --  ?Net 762.55 ml  ? ? ?Labs/Imaging ?Results for orders placed or performed during the hospital encounter of 02/20/22 (from the past 48 hour(s))  ?Basic metabolic panel     Status: Abnormal  ? Collection Time: 02/20/22  1:35 AM  ?Result Value Ref Range  ? Sodium 138 135 - 145 mmol/L  ? Potassium 2.5 (LL) 3.5 - 5.1 mmol/L  ?  Comment: REPEATED TO VERIFY ?CRITICAL RESULT CALLED TO, READ BACK BY AND VERIFIED WITH: ?BRUCKNER M,RN 02/20/22 [place] ?  ? Chloride 101 98 - 111 mmol/L  ? CO2 22 22 - 32 mmol/L  ? Glucose, Bld 175 (H) 70 - 99 mg/dL  ?  Comment: Glucose reference range applies only to samples taken after fasting for at least 8 hours.  ? BUN 10 8 - 23 mg/dL  ? Creatinine, Ser 0.96 0.44 - 1.00 mg/dL  ? Calcium 9.4 8.9 - 10.3 mg/dL  ? GFR, Estimated >60 >60 mL/min  ?  Comment: (NOTE) ?Calculated using the CKD-EPI Creatinine Equation (2021) ?  ? Anion gap 15 5 - 15  ?  Comment: Performed at [facility], [place] [address], [place], [place] [address]  ?CBC     Status: Abnormal  ? Collection Time: 02/20/22  1:35 AM  ?Result Value Ref Range  ? WBC 10.9 (H) 4.0 - 10.5 K/uL  ? RBC 3.76 (L) 3.87 - 5.11 MIL/uL  ? Hemoglobin 10.2 (L) 12.0 - 15.0 g/dL  ? HCT 33.3 (L) 36.0 - 46.0 %  ? MCV 88.6 80.0 - 100.0 fL  ? MCH 27.1 26.0 - 34.0 pg  ? MCHC 30.6 30.0 - 36.0 g/dL  ? RDW 16.3 (H) 11.5 - 15.5 %  ? Platelets 470 (H) 150 - 400 K/uL  ? nRBC 0.0 0.0 - 0.2 %  ?  Comment: Performed at [facility], [place] [address], [place], [place] [address]  ?Troponin I (High Sensitivity)     Status: None  ? Collection Time: 02/20/22  1:35 AM  ?Result Value Ref Range  ? Troponin I (High Sensitivity) 4  <18 ng/L  ?  Comment: (NOTE) ?Elevated high sensitivity troponin I (hsTnI) values and significant  ?changes across serial measurements may suggest ACS but many other  ?chronic and acute conditions are known to elevate hsTnI results.  ?Refer to the Links section for chest pain algorithms and additional  ?guidance. ?Performed at [facility], [place] [address] [place], [place] ?[address] ?  ?Brain natriuretic peptide     Status: None  ? Collection Time: 02/20/22  1:35 AM  ?Result Value Ref Range  ? B Natriuretic Peptide 8.6 0.0 - 100.0 pg/mL  ?  Comment: Performed at [facility], [place] [address] [place], [place] [address]  ?Resp Panel by RT-PCR (Flu A&B, Covid) Nasopharyngeal Swab     Status: None  ? Collection Time: 02/20/22  2:50 AM  ? Specimen: Nasopharyngeal Swab; Nasopharyngeal(NP) swabs in vial transport medium  ?Result Value Ref Range  ? SARS Coronavirus 2 by RT PCR NEGATIVE NEGATIVE  ?  Comment: (NOTE) ?SARS-CoV-2 target nucleic acids are NOT DETECTED. ? ?The SARS-CoV-2 RNA is generally detectable in upper respiratory ?specimens during the acute phase of infection. The lowest ?concentration of SARS-CoV-2 viral copies this assay can detect is ?138 copies/mL. A negative result does not preclude SARS-Cov-2 ?infection and should not be used as the sole basis for treatment or ?other patient management decisions. A negative result may occur with  ?improper specimen collection/handling, submission of specimen other ?than nasopharyngeal swab, presence of viral mutation(s) within the ?areas targeted by this assay, and inadequate number of viral ?copies(<138 copies/mL). A negative result must be combined with ?clinical observations, patient history, and epidemiological ?information. The expected result is Negative. ? ?Fact Sheet for Patients:  ?EntrepreneurPulse.com.au ? ?Fact Sheet for Healthcare Providers:  ?IncredibleEmployment.be ? ?This test is no t yet approved or  cleared by the [place] FDA and  ?has been authorized for detection and/or diagnosis of SARS-CoV-2 by ?FDA under an Emergency Use Authorization (EUA). This EUA will remain  ?in effect (meaning this test can be u

## 2022-02-20 NOTE — ED Triage Notes (Signed)
EMS arrival. Patient came from home. Hx of asthma. Arrives on CPAP. '125mg'$  Solumedrol, 2g Mag, '10mg'$  albuterol, '1mg'$  Atrovent given enroute. Tachy 152 HR on arrival.  ?

## 2022-02-20 NOTE — ED Provider Notes (Signed)
?[facility] DEPT ?[facility] Emergency Department ?Provider Note ?MRN:  [id number]  ?Arrival date & time: 02/20/22    ? ?Chief Complaint   ?Shortness of Breath ?  ?History of Present Illness   ?Lauren Lopez is a 71 y.o. year-old female presents to the ED with chief complaint of shortness of breath.  Onset was tonight.  She reports recent right TKA.  States that she is anticoagulated on Xarelto.  Reports that she sees an allergist for asthma, but she states she is uncertain if she actually has asthma.  She was given 125 mg IV Solu-Medrol, 2 g magnesium, and DuoNebs by EMS prior to arrival.  Patient denies fever, chills, or cough.  She is on BiPAP and is noted to be quite tachycardic to the 150s. ? ?History provided by patient. ? ? ?Review of Systems  ?Pertinent review of systems noted in HPI.  ? ? ?Physical Exam  ? ?Vitals:  ? 02/20/22 0430 02/20/22 0500  ?BP:  (!) 149/80  ?Pulse: (!) 108 98  ?Resp: (!) 27 (!) 30  ?Temp:    ?SpO2: 99% 100%  ?  ?CONSTITUTIONAL:  nontoxic-appearing, NAD, on Bipap ?NEURO:  Alert and oriented x 3, CN 3-12 grossly intact ?EYES:  eyes equal and reactive ?ENT/NECK:  Supple, no stridor  ?CARDIO:  tachycardic, regular rhythm, appears well-perfused  ?PULM:  Moderate respiratory distress (improved on Bipap), rales in bases ?GI/GU:  non-distended,  ?MSK/SPINE:  No gross deformities, no edema, moves all extremities  ?SKIN:  no rash, atraumatic ? ? ?*Additional and/or pertinent findings included in MDM below ? ?Diagnostic and Interventional Summary  ? ? EKG Interpretation ? ?Date/Time:  Friday February 20 2022 01:34:42 EDT ?Ventricular Rate:  153 ?PR Interval:  139 ?QRS Duration: 100 ?QT Interval:  275 ?QTC Calculation: 439 ?R Axis:   2 ?Text Interpretation: narrow complex tachycardia Repolarization abnormality, prob rate related Confirmed by Orpah Greek [phone]) on 02/20/2022 2:08:32 AM ?  ? ?  ? ?Labs Reviewed  ?BASIC METABOLIC PANEL - Abnormal; Notable for the following  components:  ?    Result Value  ? Potassium 2.5 (*)   ? Glucose, Bld 175 (*)   ? All other components within normal limits  ?CBC - Abnormal; Notable for the following components:  ? WBC 10.9 (*)   ? RBC 3.76 (*)   ? Hemoglobin 10.2 (*)   ? HCT 33.3 (*)   ? RDW 16.3 (*)   ? Platelets 470 (*)   ? All other components within normal limits  ?RESP PANEL BY RT-PCR (FLU A&B, COVID) ARPGX2  ?BRAIN NATRIURETIC PEPTIDE  ?MAGNESIUM  ?TROPONIN I (HIGH SENSITIVITY)  ?TROPONIN I (HIGH SENSITIVITY)  ?  ?CT Angio Chest PE W and/or Wo Contrast  ?Final Result  ?  ?DG Chest Port 1 View  ?Final Result  ?  ?  ?Medications  ?potassium chloride 10 mEq in 100 mL IVPB (10 mEq Intravenous Incomplete 02/20/22 0355)  ?lactated ringers bolus 500 mL (has no administration in time range)  ?potassium chloride SA (KLOR-CON M) CR tablet 40 mEq (40 mEq Oral Given 02/20/22 0355)  ?iohexol (OMNIPAQUE) 350 MG/ML injection 100 mL (100 mLs Intravenous Contrast Given 02/20/22 0425)  ?  ? ?Procedures  /  Critical Care ?Procedures ? ?ED Course and Medical Decision Making  ?I have reviewed the triage vital signs, the nursing notes, and pertinent available records from the EMR. ? ?Complexity of Problems Addressed: ?High Complexity: Acute illness/injury posing a threat to life or bodily  function, requiring emergent diagnostic workup, evaluation, and treatment as below. ?Comorbidities affecting this illness/injury include: ?Asthma, HL, DM, CKD, HTN ?Social Determinants Affecting Care: ? No clinically significant social determinants affecting this chief complaint.. ? ? ?ED Course: ?After considering the following differential, asthma exacerbation, CHF, PE, pneumonia, I ordered labs and imaging. ?I personally interpreted the labs which are notable for negative troponin and normal BNP ?I visualized the chest x-ray which is notable for no opacity and agree with the radiologist interpretation. ?I visualized the CT, which is notable for no obvious PE and agree with  radiologist interpretation. ?EKG shows normal sinus rhythm on the monitor after fluid. ?I observed the patient while on the cardiac monitor and noted that she was initially tachycardic to the 140s and 150s, but this resolved after some fluid and she is now in normal sinus rhythm.. ? ?Clinical Course as of 02/20/22 0528  ?Fri Feb 20, 2022  ?0223 Patient noted to be tachycardic, thought secondary to albuterol, but will monitor closely and will medicate or give fluid if she doesn't start trend down. ? [RB]  ?  ?Clinical Course User Index ?[RB] Montine Circle, PA-C  ? ? ?Consultants: ?I discussed the case with Hospitalist, Dr. Sidney Ace, who is appreciated for admitting. ? ?Treatment and Plan: ?Treated with BiPAP. ? ?Patient's exam and diagnostic results are concerning for asthma exacerbation requiring bipap.  Feel that patient will need admission to the hospital for further treatment and evaluation. ? ?Patient seen by and discussed with attending physician, Dr. Betsey Holiday, who agrees with plan. ? ?Final Clinical Impressions(s) / ED Diagnoses  ? ?  ICD-10-CM   ?1. Exacerbation of asthma, unspecified asthma severity, unspecified whether persistent  J45.901   ?  ?  ?ED Discharge Orders   ? ? None  ? ?  ?  ? ? ?Discharge Instructions Discussed with and Provided to Patient:  ? ?Discharge Instructions   ?None ?  ? ?  ?Montine Circle, PA-C ?02/20/22 6237 ? ?  ?Orpah Greek, MD ?02/20/22 [phone] ? ?

## 2022-02-20 NOTE — Assessment & Plan Note (Signed)
-  I have discussed code status with the patient and her daughter and the patient would not desire resuscitation and would prefer to die a natural death should that situation arise. ?-She will need a gold out of facility DNR form at the time of discharge ?

## 2022-02-20 NOTE — ED Notes (Signed)
Pt taken off Bipap, provider aware. No distress at this time. ?

## 2022-02-20 NOTE — Assessment & Plan Note (Signed)
-  Will attempt to avoid/minimize QT-prolonging medications  ?-Repeat EKG in AM ?

## 2022-02-20 NOTE — Assessment & Plan Note (Signed)
-  Weight loss should be encouraged ?-Outpatient PCP/bariatric medicine f/u encouraged ?

## 2022-02-20 NOTE — Assessment & Plan Note (Signed)
-  recent knee replacement ?-She needs to mobilize when possible but not at the risk of respiratory distress ?-Continue Xarelto ?

## 2022-02-20 NOTE — Assessment & Plan Note (Addendum)
-  Patient with recent R TKR and prior COVID infection with persistent cough since ?-She has had a couple of coughing episodes this week and became SOB this AM ?-No PE on CTA and she is on Xarelto due to recent knee replacement ?-Apparent bronchitis on CT, treated earlier this week with azithromycin without improvement ?-Suspect bronchitic asthma from a URI as her current issue ?-Obesity and immobility may also be contributing ?-CXR negative for PNA ?-will admit to progressive care ?-Nebulizers: prn albuterol and standing Duonebs ?-Solu-Medrol 80 mg IV BID -> Prednisone ?-No antibiotics at this time  ?-Received magnesium in the ER ?-Continue Advair (Dulera per formulary) ?-She was started on BIPAP in the ER but has transitioned off this ?

## 2022-02-20 NOTE — Evaluation (Signed)
Physical Therapy Evaluation ?Patient Details ?Name: Lauren Lopez ?MRN: [id number] ?DOB: 05-10-1951 ?Today's Date: 02/20/2022 ? ?History of Present Illness ? Pt is a 71 y.o. female presenting to ED 02/20/22 due to SOB. Imaging positive for lower lobe and R middle lobe central brachial thickening. Per notes, likely from asthma exacerbation. PMH includes DM2, HTN, HLD, CKD, and s/p R TKA (March 2023).  ?Clinical Impression ? Patient presented to the ED on 02/20/22 with SOB. Pt's impairments include decreased activity tolerance, strength, range of motion, and balance that are limiting her ability to safely and independently transfer and ambulate. Patient requires supervision for bed mobility and min guard A with all functional mobility with an AD. VSS during session with no SOB with mobility performed. Pt limited by pain in her R knee due to her recent R TKA earlier this month. SPT recommending continued outpatient PT upon D/C due to mobility deficits. PT will continue to follow acutely to maximize pt's safely and independence with functional mobility. ?   ?   ? ?Recommendations for follow up therapy are one component of a multi-disciplinary discharge planning process, led by the attending physician.  Recommendations may be updated based on patient status, additional functional criteria and insurance authorization. ? ?Follow Up Recommendations Outpatient PT (resumption) ? ?  ?Assistance Recommended at Discharge Intermittent Supervision/Assistance  ?Patient can return home with the following ? Assistance with cooking/housework;Assist for transportation;Help with stairs or ramp for entrance ? ?  ?Equipment Recommendations None recommended by PT  ?Recommendations for Other Services ?    ?  ?Functional Status Assessment Patient has had a recent decline in their functional status and demonstrates the ability to make significant improvements in function in a reasonable and predictable amount of time.  ? ?  ?Precautions /  Restrictions Precautions ?Precautions: Fall ?Precaution Comments: recent R TKA ?Restrictions ?Weight Bearing Restrictions: No  ? ?  ? ?Mobility ? Bed Mobility ?Overal bed mobility: Needs Assistance ?Bed Mobility: Sit to Supine ?  ?  ?Supine to sit: Supervision ?  ?  ?General bed mobility comments: Pt required supervision and increased time to sit at EOB with HOB elevated. ?  ? ?Transfers ?Overall transfer level: Needs assistance ?Equipment used: None ?Transfers: Sit to/from Stand ?Sit to Stand: Min guard ?  ?  ?  ?  ?  ?General transfer comment: pt required min guard and pt steadying during transfer. Pt required increased time and used BUE to power up. Pt able to perform without UE use for power up, however, pt rocking for momentum and close min guard assist to steady once in standing on the last sit<>stand (x4) ?  ? ?Ambulation/Gait ?Ambulation/Gait assistance: Min guard ?Gait Distance (Feet): 20 Feet ?Assistive device: None ?Gait Pattern/deviations: Step-to pattern, Step-through pattern, Decreased stance time - right, Decreased stride length ?Gait velocity: decreased ?  ?  ?General Gait Details: required min guard for ambulating forward and backward in room. Pt required cues to perform step through pattern of gait. Pt had excellent carryover with task. SpO2 remained 96% or greater on RA with mobility. Pt became faitgued quickly and mostly limited by R knee pain. ? ?Stairs ?  ?  ?  ?  ?  ? ?Wheelchair Mobility ?  ? ?Modified Rankin (Stroke Patients Only) ?  ? ?  ? ?Balance Overall balance assessment: Needs assistance ?Sitting-balance support: Bilateral upper extremity supported, Feet supported ?Sitting balance-Leahy Scale: Fair ?Sitting balance - Comments: pt used BUE support in sitting, however, pt not requiring UE support  for stability. ?  ?Standing balance support: No upper extremity supported, During functional activity ?Standing balance-Leahy Scale: Good ?Standing balance comment: pt able to shift weight  during gait without external support ?  ?  ?  ?  ?  ?  ?  ?  ?  ?  ?  ?   ? ? ? ?Pertinent Vitals/Pain Pain Assessment ?Pain Assessment: Faces ?Faces Pain Scale: Hurts little more ?Pain Location: right knee ?Pain Descriptors / Indicators: Discomfort, Grimacing, Guarding ?Pain Intervention(s): Monitored during session, Limited activity within patient's tolerance  ? ? ?Home Living Family/patient expects to be discharged to:: Private residence ?Living Arrangements: Spouse/significant other;Children ?Available Help at Discharge: Family;Available PRN/intermittently ?Type of Home: House ?Home Access: Level entry ?  ?  ?  ?Home Layout: One level ?Home Equipment: BSC/3in1;Rolling Walker (2 wheels);Tub bench;Grab bars - toilet ?Additional Comments: is home alone most of the day since husband owns a lawn service  ?  ?Prior Function Prior Level of Function : Independent/Modified Independent ?  ?  ?  ?  ?  ?  ?  ?  ?  ? ? ?Hand Dominance  ? Dominant Hand: Right ? ?  ?Extremity/Trunk Assessment  ? Upper Extremity Assessment ?Upper Extremity Assessment: Overall WFL for tasks assessed ?  ? ?Lower Extremity Assessment ?Lower Extremity Assessment: RLE deficits/detail ?RLE Deficits / Details: consistent with recent R TKA with decreased knee extension, swelling, and operative site guarding ?  ? ?Cervical / Trunk Assessment ?Cervical / Trunk Assessment: Normal  ?Communication  ? Communication: No difficulties  ?Cognition Arousal/Alertness: Awake/alert ?Behavior During Therapy: [facility] for tasks assessed/performed ?Overall Cognitive Status: Within Functional Limits for tasks assessed ?  ?  ?  ?  ?  ?  ?  ?  ?  ?  ?  ?  ?  ?  ?  ?  ?  ?  ?  ? ?  ?General Comments General comments (skin integrity, edema, etc.): SpO2 remained 96% or greater with mobility ? ?  ?Exercises General Exercises - Lower Extremity ?Ankle Circles/Pumps: AROM, Both, 5 reps, Seated ?Quad Sets: Right, AROM, Seated (3 reps)  ? ?Assessment/Plan  ?  ?PT Assessment Patient  needs continued PT services  ?PT Problem List Decreased strength;Decreased range of motion;Decreased activity tolerance;Decreased balance;Decreased mobility;Pain ? ?   ?  ?PT Treatment Interventions DME instruction;Gait training;Functional mobility training;Therapeutic activities;Therapeutic exercise;Balance training;Neuromuscular re-education;Patient/family education   ? ?PT Goals (Current goals can be found in the Care Plan section)  ?Acute Rehab PT Goals ?Patient Stated Goal: go home ?PT Goal Formulation: With patient ?Time For Goal Achievement: 03/06/22 ?Potential to Achieve Goals: Good ? ?  ?Frequency Min 3X/week ?  ? ? ?Co-evaluation   ?  ?  ?  ?  ? ? ?  ?AM-PAC PT "6 Clicks" Mobility  ?Outcome Measure Help needed turning from your back to your side while in a flat bed without using bedrails?: None ?Help needed moving from lying on your back to sitting on the side of a flat bed without using bedrails?: A Little ?Help needed moving to and from a bed to a chair (including a wheelchair)?: A Little ?Help needed standing up from a chair using your arms (e.g., wheelchair or bedside chair)?: A Little ?Help needed to walk in hospital room?: A Little ?Help needed climbing 3-5 steps with a railing? : A Lot ?6 Click Score: 18 ? ?  ?End of Session Equipment Utilized During Treatment: Gait belt ?Activity Tolerance: Patient tolerated treatment well ?Patient left:  in chair;with call bell/phone within reach;with nursing/sitter in room (in ED) ?Nurse Communication: Mobility status ?PT Visit Diagnosis: Other abnormalities of gait and mobility (R26.89);Muscle weakness (generalized) (M62.81);Difficulty in walking, not elsewhere classified (R26.2);Pain ?Pain - Right/Left: Right ?Pain - part of body: Knee ?  ? ?Time: 1102-1117 ?PT Time Calculation (min) (ACUTE ONLY): 25 min ? ? ?Charges:   PT Evaluation ?$PT Eval Low Complexity: 1 Low ?PT Treatments ?$Gait Training: 8-22 mins ?  ?   ? ? ?Jonne Ply, SPT ? ?Jonne Ply ?02/20/2022, 1:55 PM ? ?

## 2022-02-20 NOTE — Progress Notes (Signed)
Pt taken off Bipap at this time to evaluate pt's breathing.  Pt is comfortable, talking in full sentences, RR 18, Sp02 98-100% on RA, Bilateral clear BS noted. RN at bedside. Bipap on standby if needed ?

## 2022-02-20 NOTE — Progress Notes (Addendum)
Pt with increased WOB after walking to the bathroom.  Pt placed back on documented Bipap settings.  Pt tolerated well. ?

## 2022-02-20 NOTE — Assessment & Plan Note (Signed)
Continue latanoprost ?

## 2022-02-20 NOTE — Assessment & Plan Note (Signed)
-  Recent A1c was 6.5, indicating good control ?-hold Glucophage ?-Cover with moderate-scale SSI ?

## 2022-02-20 NOTE — Assessment & Plan Note (Signed)
Continue Crestor 

## 2022-02-20 NOTE — Assessment & Plan Note (Signed)
-  She is taking HCTZ ?-She had marked hypokalemia on presentation (repleted in ER, will recheck in AM) ?-Hold HCTZ ?-Will cover with prn IV hydralazine for now ?

## 2022-02-20 NOTE — TOC Initial Note (Signed)
Transition of Care (TOC) - Initial/Assessment Note  ? ? ?Patient Details  ?Name: Lauren Lopez ?MRN: [id number] ?Date of Birth: 1951-07-12 ? ?Transition of Care (TOC) CM/SW Contact:    ?Verdell Carmine, RN ?Phone Number: ?02/20/2022, 3:17 PM ? ?Clinical Narrative:                 ?Patient presented with  Asthma,  and tachycardia, post surgical Knee replacement 2 weeks ago, No PE on CTA. Bronchitis, nebs given. Has RW at home and does OP therapy at [facility]. ?PT has evaluated and still recommends OP PT resumption when DC.  ? ?CM will follow for needs, recommendations, and transitions of care ? ?Expected Discharge Plan: Home/Self Care ?Barriers to Discharge: Continued Medical Work up ? ? ?Patient Goals and CMS Choice ?  ?  ?  ? ?Expected Discharge Plan and Services ?Expected Discharge Plan: Home/Self Care ?  ?  ?  ?Living arrangements for the past 2 months: [place] ?                ?  ?  ?  ?  ?  ?  ?  ?  ?  ?  ? ?Prior Living Arrangements/Services ?Living arrangements for the past 2 months: [place] ?  ?Patient language and need for interpreter reviewed:: Yes ?       ?Need for Family Participation in Patient Care: Yes (Comment) ?Care giver support system in place?: Yes (comment) ?Current home services:  (OP PT) ?Criminal Activity/Legal Involvement Pertinent to Current Situation/Hospitalization: No - Comment as needed ? ?Activities of Daily Living ?  ?  ? ?Permission Sought/Granted ?  ?  ?   ?   ?   ?   ? ?Emotional Assessment ?  ?  ?  ?Orientation: : Oriented to Self, Oriented to Place, Oriented to  Time, Oriented to Situation ?Alcohol / Substance Use: Not Applicable ?Psych Involvement: No (comment) ? ?Admission diagnosis:  Acute asthma exacerbation [J45.901] ?Patient Active Problem List  ? Diagnosis Date Noted  ? Acute asthma exacerbation 02/20/2022  ? OA (osteoarthritis) of knee 02/02/2022  ? Allergic rhinitis due to pollen 04/03/2021  ? Lipoma of right upper extremity 05/01/2020  ?  Hyperlipidemia associated with type 2 diabetes mellitus ([place]) 04/30/2020  ? CKD stage 2 due to type 2 diabetes mellitus ([place]) 04/30/2020  ? Type 2 diabetes mellitus with stage 2 chronic kidney disease, without long-term current use of insulin ([place]) 04/30/2020  ? Obesity (BMI 30.0-34.9) 05/30/2018  ? Osteoporosis 06/01/2017  ? Back pain 04/05/2016  ? Medication management 01/29/2015  ? GERD (gastroesophageal reflux disease)   ? Vitamin D deficiency   ? Asthma   ? Essential hypertension 08/09/2009  ? ?PCP:  Unk Pinto, MD ?Pharmacy:   ?[id number] [id number]?Phone: [phone]: [phone]? ?[id number] [place] [facility] [address] [id number]: [phone]: [phone] ? ? ? ?Social Determinants of Health (SDOH) Interventions ?  ? ?Readmission Risk Interventions ?   ? View : No data to display.  ?  ?  ?  ? ? ? ?

## 2022-02-20 NOTE — ED Notes (Signed)
DNR bracelet applied and confirmed with pt and family. ?

## 2022-02-21 DIAGNOSIS — E1122 Type 2 diabetes mellitus with diabetic chronic kidney disease: Secondary | ICD-10-CM

## 2022-02-21 DIAGNOSIS — N182 Chronic kidney disease, stage 2 (mild): Secondary | ICD-10-CM

## 2022-02-21 DIAGNOSIS — I499 Cardiac arrhythmia, unspecified: Secondary | ICD-10-CM

## 2022-02-21 DIAGNOSIS — I471 Supraventricular tachycardia: Secondary | ICD-10-CM

## 2022-02-21 LAB — BASIC METABOLIC PANEL
Anion gap: 7 (ref 5–15)
BUN: 9 mg/dL (ref 8–23)
CO2: 24 mmol/L (ref 22–32)
Calcium: 9.1 mg/dL (ref 8.9–10.3)
Chloride: 107 mmol/L (ref 98–111)
Creatinine, Ser: 0.73 mg/dL (ref 0.44–1.00)
GFR, Estimated: >60 mL/min (ref >60)
Glucose, Bld: 211 mg/dL — ABNORMAL HIGH (ref 70–99)
Potassium: 4 mmol/L (ref 3.5–5.1)
Sodium: 138 mmol/L (ref 135–145)

## 2022-02-21 LAB — GLUCOSE, CAPILLARY
Glucose-Capillary: 201 mg/dL — ABNORMAL HIGH (ref 70–99)
Glucose-Capillary: 176 mg/dL — ABNORMAL HIGH (ref 70–99)
Glucose-Capillary: 166 mg/dL — ABNORMAL HIGH (ref 70–99)
Glucose-Capillary: 179 mg/dL — ABNORMAL HIGH (ref 70–99)

## 2022-02-21 LAB — HIV ANTIBODY (ROUTINE TESTING W REFLEX): HIV Screen 4th Generation wRfx: NONREACTIVE

## 2022-02-21 LAB — CBC
HCT: 31.7 % — ABNORMAL LOW (ref 36.0–46.0)
Hemoglobin: 9.8 g/dL — ABNORMAL LOW (ref 12.0–15.0)
MCH: 27.1 pg (ref 26.0–34.0)
MCHC: 30.9 g/dL (ref 30.0–36.0)
MCV: 87.6 fL (ref 80.0–100.0)
Platelets: 454 10*3/uL — ABNORMAL HIGH (ref 150–400)
RBC: 3.62 MIL/uL — ABNORMAL LOW (ref 3.87–5.11)
RDW: 16.5 % — ABNORMAL HIGH (ref 11.5–15.5)
WBC: 13 10*3/uL — ABNORMAL HIGH (ref 4.0–10.5)
nRBC: 0 % (ref 0.0–0.2)

## 2022-02-21 MED ORDER — SODIUM CHLORIDE 0.9 % IV BOLUS
250.0000 mL | Freq: Once | INTRAVENOUS | Status: AC
  Administered 2022-02-21: 250 mL via INTRAVENOUS

## 2022-02-21 MED ORDER — GUAIFENESIN 100 MG/5ML PO LIQD
5.0000 mL | ORAL | Status: DC | PRN
  Administered 2022-02-21 – 2022-02-23 (×4): 5 mL via ORAL
  Filled 2022-02-21 (×4): qty 5

## 2022-02-21 MED ORDER — ASPIRIN 81 MG PO CHEW: 81.0000 mg | CHEWABLE_TABLET | Freq: Every day | ORAL | Status: DC

## 2022-02-21 MED ORDER — DILTIAZEM HCL 60 MG PO TABS
60.0000 mg | ORAL_TABLET | Freq: Four times a day (QID) | ORAL | Status: DC
  Administered 2022-02-21 – 2022-02-23 (×8): 60 mg via ORAL
  Filled 2022-02-21 (×8): qty 1

## 2022-02-21 MED ORDER — METHYLPREDNISOLONE SODIUM SUCC 125 MG IJ SOLR
80.0000 mg | Freq: Two times a day (BID) | INTRAMUSCULAR | Status: DC
  Administered 2022-02-21 – 2022-02-22 (×2): 80 mg via INTRAVENOUS
  Filled 2022-02-21 (×2): qty 2

## 2022-02-21 MED ORDER — LEVALBUTEROL HCL 0.63 MG/3ML IN NEBU
0.6300 mg | INHALATION_SOLUTION | Freq: Four times a day (QID) | RESPIRATORY_TRACT | Status: DC | PRN
  Filled 2022-02-21: qty 3

## 2022-02-21 NOTE — Evaluation (Addendum)
Occupational Therapy Evaluation ?Patient Details ?Name: Lauren Lopez ?MRN: [id number] ?DOB: 02/04/1951 ?Today's Date: 02/21/2022 ? ? ?History of Present Illness Pt is a 71 y.o. female presenting to ED 02/20/22 due to SOB. Imaging positive for lower lobe and R middle lobe central brachial thickening. Per notes, likely from asthma exacerbation. PMH includes DM2, HTN, HLD, CKD, and s/p R TKA (March 2023).  ? ?Clinical Impression ?  ?Pt admitted with the above diagnoses and presents with below problem list. Pt will benefit from continued acute OT to address the below listed deficits and maximize independence with basic ADLs prior to d/c home. At basline, pt is independent with ADLs, recently mod I since R TKR 3 weeks ago. Pt on the phone when OT entered the room, HR 143 and maintained HR 141-145 for the next several minutes. Nursing notified and aware. Did not mobilize pt this session d/t episode of tachycardia at rest. Per clinical judgement pt currently needs up to min guard assist with LB ADLs, functional transfers and mobility. Pt's HR noted to decrease to 85-87 range at end of session.  ?  ?   ? ?Recommendations for follow up therapy are one component of a multi-disciplinary discharge planning process, led by the attending physician.  Recommendations may be updated based on patient status, additional functional criteria and insurance authorization.  ? ?Follow Up Recommendations ? Other (comment) (cardiac rehab?)  ?  ?Assistance Recommended at Discharge PRN  ?Patient can return home with the following Assistance with cooking/housework ? ?  ?Functional Status Assessment ? Patient has had a recent decline in their functional status and demonstrates the ability to make significant improvements in function in a reasonable and predictable amount of time.  ?Equipment Recommendations ? None recommended by OT  ?  ?Recommendations for Other Services   ? ? ?  ?Precautions / Restrictions Precautions ?Precautions:  Fall ?Precaution Comments: recent R TKA ?Restrictions ?Weight Bearing Restrictions: No (Right knee has no ordered restrictions)  ? ?  ? ?Mobility Bed Mobility ?  ?  ?  ?  ?  ?  ?  ?General bed mobility comments: up in recliner ?  ? ?Transfers ?Overall transfer level: Needs assistance ?  ?  ?  ?  ?  ?  ?  ?  ?General transfer comment: Per nurse and pt she needed up to min guard assist to take pivotal steps to sit up in recliner this morning. HR 141-145 limiting ability to safely complete transfer this session. ?  ? ?  ?Balance   ?  ?  ?  ?  ?  ?  ?  ?  ?  ?  ?  ?  ?  ?  ?  ?  ?  ?  ?   ? ?ADL either performed or assessed with clinical judgement  ? ?ADL Overall ADL's : Needs assistance/impaired ?Eating/Feeding: Set up;Sitting ?  ?Grooming: Set up;Sitting ?  ?Upper Body Bathing: Set up;Sitting ?  ?Lower Body Bathing: Min guard;Sit to/from stand ?  ?Upper Body Dressing : Set up;Sitting ?  ?Lower Body Dressing: Min guard;Sit to/from stand ?  ?Toilet Transfer: Min guard ?  ?  ?  ?  ?  ?  ?General ADL Comments: per clinical judgement, chart review, and conversations with pt and nurse. Tachycardia at rest limiting session.  ? ? ? ?Vision   ?   ?   ?Perception   ?  ?Praxis   ?  ? ?Pertinent Vitals/Pain Pain Assessment ?Pain Assessment: Faces ?Pain  Score: 5  ?Pain Location: right knee ?Pain Descriptors / Indicators: Discomfort, Grimacing, Guarding ?Pain Intervention(s): Ice applied, Monitored during session  ? ? ? ?Hand Dominance Right ?  ?Extremity/Trunk Assessment Upper Extremity Assessment ?Upper Extremity Assessment: Overall WFL for tasks assessed ?  ?Lower Extremity Assessment ?Lower Extremity Assessment: Defer to PT evaluation;RLE deficits/detail ?RLE Deficits / Details: recent R TKR ?  ?  ?  ?Communication Communication ?Communication: No difficulties ?  ?Cognition Arousal/Alertness: Awake/alert ?Behavior During Therapy: [facility] for tasks assessed/performed ?Overall Cognitive Status: Within Functional Limits for tasks  assessed ?  ?  ?  ?  ?  ?  ?  ?  ?  ?  ?  ?  ?  ?  ?  ?  ?  ?  ?  ?General Comments    ? ?  ?Exercises   ?  ?Shoulder Instructions    ? ? ?Home Living Family/patient expects to be discharged to:: Private residence ?Living Arrangements: Spouse/significant other;Children ?Available Help at Discharge: Family;Available PRN/intermittently ?Type of Home: House ?Home Access: Level entry ?  ?  ?Home Layout: One level ?  ?  ?Bathroom Shower/Tub: Walk-in shower ?  ?Bathroom Toilet: Standard ?  ?  ?Home Equipment: BSC/3in1;Rolling Walker (2 wheels);Tub bench;Grab bars - toilet ?  ?Additional Comments: spoude works ?  ? ?  ?Prior Functioning/Environment Prior Level of Function : Independent/Modified Independent ?  ?  ?  ?  ?  ?  ?  ?  ?  ? ?  ?  ?OT Problem List: Decreased strength;Decreased activity tolerance;Impaired balance (sitting and/or standing);Decreased knowledge of use of DME or AE;Decreased knowledge of precautions;Cardiopulmonary status limiting activity;Pain ?  ?   ?OT Treatment/Interventions: Self-care/ADL training;DME and/or AE instruction;Balance training;Patient/family education;Therapeutic activities  ?  ?OT Goals(Current goals can be found in the care plan section) Acute Rehab OT Goals ?Patient Stated Goal: figure out what's going on with her heart ?OT Goal Formulation: With patient ?Time For Goal Achievement: 03/07/22 ?Potential to Achieve Goals: Good ?ADL Goals ?Pt Will Perform Grooming: with modified independence;standing ?Pt Will Perform Lower Body Bathing: with modified independence;sit to/from stand ?Pt Will Perform Lower Body Dressing: with modified independence;sit to/from stand ?Pt Will Transfer to Toilet: with modified independence;ambulating  ?OT Frequency: Min 2X/week ?  ? ?Co-evaluation   ?  ?  ?  ?  ? ?  ?AM-PAC OT "6 Clicks" Daily Activity     ?Outcome Measure Help from another person eating meals?: None ?Help from another person taking care of personal grooming?: None ?Help from another person  toileting, which includes using toliet, bedpan, or urinal?: A Little ?Help from another person bathing (including washing, rinsing, drying)?: A Little ?Help from another person to put on and taking off regular upper body clothing?: None ?Help from another person to put on and taking off regular lower body clothing?: A Little ?6 Click Score: 21 ?  ?End of Session Nurse Communication: Other (comment) (HR) ? ?Activity Tolerance: Other (comment) (HR 141-145 at rest at beginning of session, sustained for several minutes. Noted to drop to 85-87 at end of session. Did not attempt mobility this session.) ?Patient left: in chair;with call bell/phone within reach ? ?OT Visit Diagnosis: Unsteadiness on feet (R26.81);Pain;Other (comment) (decreased activity tolerance) ?Pain - Right/Left: Right ?Pain - part of body: Knee  ?              ?Time: 6283-6629 ?OT Time Calculation (min): 17 min ?Charges:  OT General Charges ?$OT Visit: 1 Visit ?OT Evaluation ?$  OT Eval Low Complexity: 1 Low ? ?Tyrone Schimke, OT ?Acute Rehabilitation Services ?Office: [phone] ? ? ?Tyrone Schimke H ?02/21/2022, 12:31 PM ?

## 2022-02-21 NOTE — Progress Notes (Signed)
New orders for EKG and 250 mL of NS bolus received form Dr. Nevada Crane. Orders already implemented. HR is in the 80s. Will continue to monitor. ?

## 2022-02-21 NOTE — Progress Notes (Signed)
?PROGRESS NOTE ? ? ? ?Lauren Lopez  KGM:[id number] DOB: 01-29-1951 DOA: 02/20/2022 ?PCP: Unk Pinto, MD  ? ? ?Chief Complaint  ?Patient presents with  ? Shortness of Breath  ? ? ?Brief Narrative:  ? ?Lauren Lopez is a 71 year old female with H/oh DM 2, HTN, HL, asthma and GERD, coming with acute asthma exacerbation with acute respiratory failure requiring BiPAP, with hypokalemia of 2.5, status post Solu-Medrol, nebulized bronchodilator therapy and magnesium with improvement initially however with atrial tachycardia that later improved also,  however when patient walked to the bathroom she had recurrent worsening dyspnea and wheezing again requiring BiPAP ?  ?Assessment & Plan: ?  ?Principal Problem: ?  Acute asthma exacerbation ?Active Problems: ?  Essential hypertension ?  Obesity (BMI 30.0-34.9) ?  Hyperlipidemia associated with type 2 diabetes mellitus ([place]) ?  Type 2 diabetes mellitus with stage 2 chronic kidney disease, without long-term current use of insulin ([place]) ?  Glaucoma ?  Prolonged QT interval ?  S/P knee replacement ?  DNR (do not resuscitate) ? ? ?Acute asthma exacerbation ?-Patient with recent R TKR and prior COVID infection with persistent cough since ?-She has had a couple of coughing episodes this week and became SOB this AM ?-No PE on CTA and she is on Xarelto due to recent knee replacement ?-Apparent bronchitis on CT, treated earlier this week with azithromycin without improvement ?-Suspect bronchitic asthma from a URI as her current issue ?-Obesity and immobility may also be contributing ?-CXR negative for PNA ?-Continue treatment for asthma exacerbation, dyspnea has improved, but she remains with some wheezing, she will continue with IV Solu-Medrol currently hoping to change to oral prednisone in 1 to 2 days ?-No antibiotics at this time  ?-Received magnesium in the ER ?-Continue Advair ([place] per formulary) ?  ?S/P knee replacement ?-recent knee replacement ?-She needs to  mobilize when possible but not at the risk of respiratory distress ?-Continue Xarelto ?-Consult PT/OT ?  ?Arrhythmias ?-Patient was monitored on telemetry since admission as she had prolonged QTc, is having few bursts of tachycardia where her heart rate shoots up to 140s 150s even at rest, I have requested cardiology input for further evaluation. ? ?Glaucoma ?-Continue latanoprost ?  ?Type 2 diabetes mellitus with stage 2 chronic kidney disease, without long-term current use of insulin ([place]) ?-Recent A1c was 6.5, indicating good control ?-hold Glucophage ?-Cover with moderate-scale SSI ?  ?Hyperlipidemia associated with type 2 diabetes mellitus ([place]) ?-Continue Crestor ?  ?Obesity (BMI 30.0-34.9) ?-Weight loss should be encouraged ?-Outpatient PCP/bariatric medicine f/u encouraged ?  ?Essential hypertension ?-She is taking HCTZ ?-She had marked hypokalemia on presentation (repleted in ER, will recheck in AM) ?-Hold HCTZ ?-Will cover with prn IV hydralazine for now ?  ?  ?  ? ?DVT prophylaxis:Xarelto ?Code Status: Full, this was confirmed by the patient today. ?Family Communication: None at bedside ?Disposition:  ? ?Status is: Inpatient ?Remains inpatient appropriate because: Remains with significant arrhythmias, on steroids for asthma treatment. ?  ?Consultants:  ?Cardiology ? ?Subjective: ? ?She reports some mild dyspnea, generalized body ache, she denies any dizziness or lightheadedness or palpitations. ? ?Objective: ?Vitals:  ? 02/21/22 1030 02/21/22 1040 02/21/22 1100 02/21/22 1146  ?BP:    139/79  ?Pulse: (!) 143 87 91 (!) 144  ?Resp: 20 (!) 22 (!) 21 20  ?Temp:    98.4 ?F (36.9 ?C)  ?TempSrc:    Oral  ?SpO2: 100% 100% 99% 100%  ?Weight:      ? ? ?  Intake/Output Summary (Last 24 hours) at 02/21/2022 1156 ?Last data filed at 02/21/2022 1100 ?Gross per 24 hour  ?Intake 490 ml  ?Output 300 ml  ?Net 190 ml  ? ?Filed Weights  ? 02/21/22 0402  ?Weight: 84.8 kg  ? ? ?Examination: ? ?Awake Alert, Oriented X 3, No new F.N  deficits, Normal affect ?Symmetrical Chest wall movement, Good air movement bilaterally, scattered wheezing ?Tachycardic,No Gallops,Rubs or new Murmurs, No Parasternal Heave ?+ve B.Sounds, Abd Soft, No tenderness, No rebound - guarding or rigidity. ?No Cyanosis, Clubbing right knee swollen postoperatively, history of bandage applied anteriorly. ? ? ?Data Reviewed: I have personally reviewed following labs and imaging studies ? ?CBC: ?Recent Labs  ?Lab 02/20/22 ?0135 02/21/22 ?0141  ?WBC 10.9* 13.0*  ?HGB 10.2* 9.8*  ?HCT 33.3* 31.7*  ?MCV 88.6 87.6  ?PLT 470* 454*  ? ? ?Basic Metabolic Panel: ?Recent Labs  ?Lab 02/20/22 ?0135 02/20/22 ?0359 02/21/22 ?0141  ?NA 138  --  138  ?K 2.5*  --  4.0  ?CL 101  --  107  ?CO2 22  --  24  ?GLUCOSE 175*  --  211*  ?BUN 10  --  9  ?CREATININE 0.96  --  0.73  ?CALCIUM 9.4  --  9.1  ?MG  --  1.8  --   ? ? ?GFR: ?Estimated Creatinine Clearance: 67.6 mL/min (by C-G formula based on SCr of 0.73 mg/dL). ? ?Liver Function Tests: ?No results for input(s): AST, ALT, ALKPHOS, BILITOT, PROT, ALBUMIN in the last 168 hours. ? ?CBG: ?Recent Labs  ?Lab 02/20/22 ?1213 02/20/22 ?1738 02/20/22 ?2028 02/21/22 ?0829  ?GLUCAP 236* 140* 171* 201*  ? ? ? ?Recent Results (from the past 240 hour(s))  ?Resp Panel by RT-PCR (Flu A&B, Covid) Nasopharyngeal Swab     Status: None  ? Collection Time: 02/20/22  2:50 AM  ? Specimen: Nasopharyngeal Swab; Nasopharyngeal(NP) swabs in vial transport medium  ?Result Value Ref Range Status  ? SARS Coronavirus 2 by RT PCR NEGATIVE NEGATIVE Final  ?  Comment: (NOTE) ?SARS-CoV-2 target nucleic acids are NOT DETECTED. ? ?The SARS-CoV-2 RNA is generally detectable in upper respiratory ?specimens during the acute phase of infection. The lowest ?concentration of SARS-CoV-2 viral copies this assay can detect is ?138 copies/mL. A negative result does not preclude SARS-Cov-2 ?infection and should not be used as the sole basis for treatment or ?other patient management decisions.  A negative result may occur with  ?improper specimen collection/handling, submission of specimen other ?than nasopharyngeal swab, presence of viral mutation(s) within the ?areas targeted by this assay, and inadequate number of viral ?copies(<138 copies/mL). A negative result must be combined with ?clinical observations, patient history, and epidemiological ?information. The expected result is Negative. ? ?Fact Sheet for Patients:  ?EntrepreneurPulse.com.au ? ?Fact Sheet for Healthcare Providers:  ?IncredibleEmployment.be ? ?This test is no t yet approved or cleared by the [place] FDA and  ?has been authorized for detection and/or diagnosis of SARS-CoV-2 by ?FDA under an Emergency Use Authorization (EUA). This EUA will remain  ?in effect (meaning this test can be used) for the duration of the ?COVID-19 declaration under Section 564(b)(1) of the Act, 21 ?U.S.C.section 360bbb-3(b)(1), unless the authorization is terminated  ?or revoked sooner.  ? ? ?  ? Influenza A by PCR NEGATIVE NEGATIVE Final  ? Influenza B by PCR NEGATIVE NEGATIVE Final  ?  Comment: (NOTE) ?The Xpert Xpress SARS-CoV-2/FLU/RSV plus assay is intended as an aid ?in the diagnosis of influenza from Nasopharyngeal swab  specimens and ?should not be used as a sole basis for treatment. Nasal washings and ?aspirates are unacceptable for Xpert Xpress SARS-CoV-2/FLU/RSV ?testing. ? ?Fact Sheet for Patients: ?EntrepreneurPulse.com.au ? ?Fact Sheet for Healthcare Providers: ?IncredibleEmployment.be ? ?This test is not yet approved or cleared by the [place] FDA and ?has been authorized for detection and/or diagnosis of SARS-CoV-2 by ?FDA under an Emergency Use Authorization (EUA). This EUA will remain ?in effect (meaning this test can be used) for the duration of the ?COVID-19 declaration under Section 564(b)(1) of the Act, 21 U.S.C. ?section 360bbb-3(b)(1), unless the authorization  is terminated or ?revoked. ? ?Performed at [facility], [place] [address], [place], [place] ?[address] ?  ?  ? ? ? ? ? ?Radiology Studies: ?CT Angio Chest PE W and/or Wo Contrast ? ?Result Date: 3/

## 2022-02-21 NOTE — Consult Note (Signed)
?Cardiology Consultation:  ? ?Patient ID: Lauren Lopez ?MRN: [id number]; DOB: [birth year] ? ?Admit date: 02/20/2022 ?Date of Consult: 02/21/2022 ? ?PCP:  Unk Pinto, MD ?  ?[facility] Providers ?Cardiologist:  None      ? ? ?Patient Profile:  ? ?Lauren Lopez is a 71 y.o. female with a hx of hypertension, hyperlipidemia, T2DM, asthma who is being seen 02/21/2022 for the evaluation of SVT at the request of Dr Waldron Labs. ? ?History of Present Illness:  ? ?Ms. Yaw is a 71 year old female with a history of hypertension, hyperlipidemia, T2DM, asthma who presented with shortness of breath.  She underwent knee replacement 3 weeks ago.  She does not have acute asthma exacerbation requiring BiPAP.  She has had hypokalemia to 2.5.  She was treated with Solu-Medrol and bronchodilators with improvement.  She had a CTA which showed no PE.  She is on Xarelto status post knee replacement.  She is having runs of SVT to 140s.  Appears asymptomatic during episodes.  Denies any chest pain, dyspnea, lightheadedness. ? ? ?Past Medical History:  ?Diagnosis Date  ? Arthritis   ? "right hand, back" (04/03/2016)  ? Asthma   ? takes weekly allergy shots, none in 5-6 weeks due to knee surgery  ? Bilateral carpal tunnel syndrome   ? Chronic lower back pain   ? GERD (gastroesophageal reflux disease)   ? Hyperlipidemia   ? Hypertension   ? Multiple allergies   ? Pneumonia 03/2012  ? Positive PPD   ? "they told me it wasn't positive; I was allergic to the test itself"  ? Right rotator cuff tear 01/20/2013  ? Type II diabetes mellitus ([place])   ? 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 Vitamin deficiency   ? Wears dentures   ? upper  ? Wears glasses   ? ? ?Past Surgical History:  ?Procedure Laterality Date  ? ABDOMINAL HYSTERECTOMY    ? BILATERAL SALPINGOOPHORECTOMY  11/28/2010  ? open laparoscopy with adhesiolysis also  ? CARPAL TUNNEL RELEASE  07/15/2012  ? Procedure: CARPAL TUNNEL RELEASE;  Surgeon: Cammie Sickle., MD;  Location:  [facility];  Service: Orthopedics;  Laterality: Left;  ? CARPAL TUNNEL RELEASE Right 05/31/2014  ? Procedure: RIGHT CARPAL TUNNEL RELEASE;  Surgeon: Cammie Sickle, MD;  Location: [facility];  Service: Orthopedics;  Laterality: Right;  ? COLONOSCOPY    ? KNEE ARTHROSCOPY Bilateral 2005-2016  ? "right-left"  ? SHOULDER ARTHROSCOPY WITH ROTATOR CUFF REPAIR AND SUBACROMIAL DECOMPRESSION Right 01/20/2013  ? Procedure: RIGHT ARTHROSCOPY SHOULDER DEBRIDEMENT LIMITED, ARTHROSCOPY SHOULDER DECOMPRESSION SUBACROMIAL PARTIAL ACROMIOPLASTY WITH CORACOACROMIAL RELEASE, ROTATOR CUFF REPAIR ;  Surgeon: Johnny Bridge, MD;  Location: [facility];  Service: Orthopedics;  Laterality: Right;  RIGHT SHOULDER SCOPE DEBRIDEMENT, ACRIOMIOPLASTY, ROTATOR CUFF REPAIR  ? TOTAL KNEE ARTHROPLASTY Right 02/02/2022  ? Procedure: TOTAL KNEE ARTHROPLASTY;  Surgeon: Gaynelle Arabian, MD;  Location: [facility];  Service: Orthopedics;  Laterality: Right;  ? TUBAL LIGATION    ?  ? ? ?Inpatient Medications: ?Scheduled Meds: ? [START ON 02/25/2022] aspirin  81 mg Oral Daily  ? docusate sodium  100 mg Oral BID  ? fluticasone  2 spray Each Nare Daily  ? insulin aspart  0-15 Units Subcutaneous TID WC  ? insulin aspart  0-5 Units Subcutaneous QHS  ? latanoprost  1 drop Both Eyes QHS  ? loratadine  10 mg Oral Daily  ? methylPREDNISolone (SOLU-MEDROL) injection  80 mg Intravenous Q12H  ?  mometasone-formoterol  2 puff Inhalation BID  ? pantoprazole  40 mg Oral Daily  ? rivaroxaban  10 mg Oral Daily  ? rosuvastatin  20 mg Oral Daily  ? sodium chloride flush  3 mL Intravenous Q12H  ? ?Continuous Infusions: ? ?PRN Meds: ?acetaminophen **OR** acetaminophen, bisacodyl, guaiFENesin, hydrALAZINE, HYDROcodone-acetaminophen, levalbuterol, methocarbamol, morphine injection, ondansetron **OR** ondansetron (ZOFRAN) IV, oxyCODONE, polyethylene glycol, traMADol ? ?Allergies:    ?Allergies  ?Allergen Reactions  ? Metronidazole Other (See  Comments)  ?  unknown  ? Ppd [Tuberculin Purified Protein Derivative] Other (See Comments)  ?  + PPD 2013 with NEG CXR 05/2012 ?Redness and severe swelling at injection site  ? ? ?Social History:   ?Social History  ? ?Socioeconomic History  ? Marital status: Married  ?  Spouse name: Not on file  ? Number of children: Not on file  ? Years of education: Not on file  ? Highest education level: Not on file  ?Occupational History  ? Occupation: retired  ?Tobacco Use  ? Smoking status: Never  ? Smokeless tobacco: Never  ?Vaping Use  ? Vaping Use: Never used  ?Substance and Sexual Activity  ? Alcohol use: No  ? Drug use: No  ? Sexual activity: Not Currently  ?  Birth control/protection: Post-menopausal  ?Other Topics Concern  ? Not on file  ?Social History Narrative  ? Not on file  ? ?Social Determinants of Health  ? ?Financial Resource Strain: Not on file  ?Food Insecurity: Not on file  ?Transportation Needs: Not on file  ?Physical Activity: Not on file  ?Stress: Not on file  ?Social Connections: Not on file  ?Intimate Partner Violence: Not on file  ?  ?Family History:   ? ?Family History  ?Problem Relation Age of Onset  ? Hypertension Mother   ? Diabetes Mother   ? Dementia Mother   ? Hypertension Father   ? Alzheimer's disease Father   ? Tongue cancer Sister   ?     smoker  ? Cirrhosis Brother   ? Hypertension Brother   ?  ? ?ROS:  ?Please see the history of present illness.  ? ?All other ROS reviewed and negative.    ? ?Physical Exam/Data:  ? ?Vitals:  ? 02/21/22 1040 02/21/22 1100 02/21/22 1146 02/21/22 1232  ?BP:   139/79   ?Pulse: 87 91 (!) 144 (!) 144  ?Resp: (!) 22 (!) '21 20 17  '$ ?Temp:   98.4 ?F (36.9 ?C)   ?TempSrc:   Oral   ?SpO2: 100% 99% 100% 98%  ?Weight:      ? ? ?Intake/Output Summary (Last 24 hours) at 02/21/2022 1326 ?Last data filed at 02/21/2022 1100 ?Gross per 24 hour  ?Intake 490 ml  ?Output 300 ml  ?Net 190 ml  ? ? ?  02/21/2022  ?  4:02 AM 02/02/2022  ?  6:24 AM 01/23/2022  ? 11:02 AM  ?Last 3 Weights   ?Weight (lbs) 186 lb 15.2 oz 194 lb 6.4 oz 194 lb 6.4 oz  ?Weight (kg) 84.8 kg 88.179 kg 88.179 kg  ?   ?Body mass index is 33.12 kg/m?.  ?General:  Well nourished, well developed, in no acute distress ?HEENT: normal ?Neck: no JVD ?Vascular: No carotid bruits; Distal pulses 2+ bilaterally ?Cardiac:  normal S1, S2; RRR; no murmur  ?Lungs:  clear to auscultation bilaterally, no wheezing, rhonchi or rales  ?Abd: soft, nontender, no hepatomegaly  ?Ext: no edema ?Musculoskeletal:  No deformities, BUE and BLE strength normal and  equal ?Skin: warm and dry  ?Neuro:  CNs 2-12 intact, no focal abnormalities noted ?Psych:  Normal affect  ? ?EKG:  The EKG was personally reviewed and demonstrates: Narrow complex tachycardia, rate 137 ?Telemetry:  Telemetry was personally reviewed and demonstrates: Runs of SVT to 140s, currently normal sinus rhythm in 90s ? ?Relevant CV Studies: ? ? ?Laboratory Data: ? ?High Sensitivity Troponin:   ?Recent Labs  ?Lab 02/20/22 ?0135 02/20/22 ?0359  ?TROPONINIHS 4 3  ?   ?Chemistry ?Recent Labs  ?Lab 02/20/22 ?0135 02/20/22 ?0359 02/21/22 ?0141  ?NA 138  --  138  ?K 2.5*  --  4.0  ?CL 101  --  107  ?CO2 22  --  24  ?GLUCOSE 175*  --  211*  ?BUN 10  --  9  ?CREATININE 0.96  --  0.73  ?CALCIUM 9.4  --  9.1  ?MG  --  1.8  --   ?GFRNONAA >60  --  >60  ?ANIONGAP 15  --  7  ?  ?No results for input(s): PROT, ALBUMIN, AST, ALT, ALKPHOS, BILITOT in the last 168 hours. ?Lipids No results for input(s): CHOL, TRIG, HDL, LABVLDL, LDLCALC, CHOLHDL in the last 168 hours.  ?Hematology ?Recent Labs  ?Lab 02/20/22 ?0135 02/21/22 ?0141  ?WBC 10.9* 13.0*  ?RBC 3.76* 3.62*  ?HGB 10.2* 9.8*  ?HCT 33.3* 31.7*  ?MCV 88.6 87.6  ?MCH 27.1 27.1  ?MCHC 30.6 30.9  ?RDW 16.3* 16.5*  ?PLT 470* 454*  ? ?Thyroid No results for input(s): TSH, FREET4 in the last 168 hours.  ?BNP ?Recent Labs  ?Lab 02/20/22 ?0135  ?BNP 8.6  ?  ?DDimer No results for input(s): DDIMER in the last 168 hours. ? ? ?Radiology/Studies:  ?CT Angio Chest PE  W and/or Wo Contrast ? ?Result Date: 02/20/2022 ?CLINICAL DATA:  History of asthma, suspected pulmonary embolism, high probability. EXAM: CT ANGIOGRAPHY CHEST WITH CONTRAST TECHNIQUE: Multidetector CT imagi

## 2022-02-21 NOTE — Progress Notes (Signed)
HR back to 130 s. Patient is asymptomatic. Dr. Nevada Crane notified and awaiting for new direction. Will continue to monitor. ?

## 2022-02-21 NOTE — Progress Notes (Signed)
Pt has PRN Bipap , no distress noted at this time.  

## 2022-02-21 NOTE — Progress Notes (Signed)
HR came down to the 80 s without any intervention. ?

## 2022-02-21 NOTE — Progress Notes (Signed)
Patient's HR is sustaining at 140 after receiving a breathing treatment. She's asymptomatic. It's been sustaining for about 12 minutes. Notified Dr. Nevada Crane and she indicated to wait for 30 minutes because of the duoneb. Will continue to monitor. ?

## 2022-02-22 DIAGNOSIS — I471 Supraventricular tachycardia: Secondary | ICD-10-CM

## 2022-02-22 LAB — BASIC METABOLIC PANEL
Anion gap: 6 (ref 5–15)
BUN: 16 mg/dL (ref 8–23)
CO2: 22 mmol/L (ref 22–32)
Calcium: 9.2 mg/dL (ref 8.9–10.3)
Chloride: 110 mmol/L (ref 98–111)
Creatinine, Ser: 0.76 mg/dL (ref 0.44–1.00)
GFR, Estimated: >60 mL/min (ref >60)
Glucose, Bld: 158 mg/dL — ABNORMAL HIGH (ref 70–99)
Potassium: 4.5 mmol/L (ref 3.5–5.1)
Sodium: 138 mmol/L (ref 135–145)

## 2022-02-22 LAB — CBC
HCT: 31.6 % — ABNORMAL LOW (ref 36.0–46.0)
Hemoglobin: 9.8 g/dL — ABNORMAL LOW (ref 12.0–15.0)
MCH: 27.1 pg (ref 26.0–34.0)
MCHC: 31 g/dL (ref 30.0–36.0)
MCV: 87.3 fL (ref 80.0–100.0)
Platelets: 496 10*3/uL — ABNORMAL HIGH (ref 150–400)
RBC: 3.62 MIL/uL — ABNORMAL LOW (ref 3.87–5.11)
RDW: 16.8 % — ABNORMAL HIGH (ref 11.5–15.5)
WBC: 15.1 10*3/uL — ABNORMAL HIGH (ref 4.0–10.5)
nRBC: 0 % (ref 0.0–0.2)

## 2022-02-22 LAB — GLUCOSE, CAPILLARY
Glucose-Capillary: 198 mg/dL — ABNORMAL HIGH (ref 70–99)
Glucose-Capillary: 199 mg/dL — ABNORMAL HIGH (ref 70–99)
Glucose-Capillary: 247 mg/dL — ABNORMAL HIGH (ref 70–99)
Glucose-Capillary: 259 mg/dL — ABNORMAL HIGH (ref 70–99)
Glucose-Capillary: 184 mg/dL — ABNORMAL HIGH (ref 70–99)

## 2022-02-22 LAB — MAGNESIUM: Magnesium: 2.2 mg/dL (ref 1.7–2.4)

## 2022-02-22 MED ORDER — PREDNISONE 20 MG PO TABS
40.0000 mg | ORAL_TABLET | Freq: Every day | ORAL | Status: DC
  Administered 2022-02-23 – 2022-02-24 (×2): 40 mg via ORAL
  Filled 2022-02-22 (×2): qty 2

## 2022-02-22 MED ORDER — ENSURE ENLIVE PO LIQD
237.0000 mL | Freq: Two times a day (BID) | ORAL | Status: DC
  Administered 2022-02-22: 237 mL via ORAL

## 2022-02-22 NOTE — Progress Notes (Addendum)
? ?Progress Note ? ?Patient Name: Lauren Lopez ?Date of Encounter: 02/22/2022 ? ?[facility] Cardiologist: Donato Heinz, MD  ? ?Subjective  ? ?No chest pain or dyspnea ? ?Inpatient Medications  ?  ?Scheduled Meds: ? [START ON 02/25/2022] aspirin  81 mg Oral Daily  ? diltiazem  60 mg Oral Q6H  ? docusate sodium  100 mg Oral BID  ? feeding supplement  237 mL Oral BID BM  ? fluticasone  2 spray Each Nare Daily  ? insulin aspart  0-15 Units Subcutaneous TID WC  ? insulin aspart  0-5 Units Subcutaneous QHS  ? latanoprost  1 drop Both Eyes QHS  ? loratadine  10 mg Oral Daily  ? mometasone-formoterol  2 puff Inhalation BID  ? pantoprazole  40 mg Oral Daily  ? [START ON 02/23/2022] predniSONE  40 mg Oral Q breakfast  ? rivaroxaban  10 mg Oral Daily  ? rosuvastatin  20 mg Oral Daily  ? sodium chloride flush  3 mL Intravenous Q12H  ? ?Continuous Infusions: ? ?PRN Meds: ?acetaminophen **OR** acetaminophen, bisacodyl, guaiFENesin, hydrALAZINE, HYDROcodone-acetaminophen, levalbuterol, methocarbamol, morphine injection, ondansetron **OR** ondansetron (ZOFRAN) IV, oxyCODONE, polyethylene glycol, traMADol  ? ?Vital Signs  ?  ?Vitals:  ? 02/22/22 0400 02/22/22 0815 02/22/22 0825 02/22/22 1231  ?BP: 118/80 120/76  (!) 121/59  ?Pulse: (!) 110 (!) 122  82  ?Resp: '17 17  14  '$ ?Temp:  98.5 ?F (36.9 ?C)  98.3 ?F (36.8 ?C)  ?TempSrc:  Oral  Oral  ?SpO2: 95% 100% 98% 98%  ?Weight:      ? ? ?Intake/Output Summary (Last 24 hours) at 02/22/2022 1306 ?Last data filed at 02/21/2022 1720 ?Gross per 24 hour  ?Intake 240 ml  ?Output --  ?Net 240 ml  ? ? ?  02/22/2022  ?  2:03 AM 02/21/2022  ?  4:02 AM 02/02/2022  ?  6:24 AM  ?Last 3 Weights  ?Weight (lbs) 187 lb 9.8 oz 186 lb 15.2 oz 194 lb 6.4 oz  ?Weight (kg) 85.1 kg 84.8 kg 88.179 kg  ?   ? ?Telemetry  ?  ?SVT up to 150, currently NSR in 80s - Personally Reviewed ? ?ECG  ?  ?No new ECG - Personally Reviewed ? ?Physical Exam  ? ?GEN: No acute distress.   ?Neck: No JVD ?Cardiac: RRR, no  murmurs, rubs, or gallops.  ?Respiratory: Clear to auscultation bilaterally. ?GI: Soft, nontender, non-distended  ?MS: No edema; No deformity. ?Neuro:  Nonfocal  ?Psych: Normal affect  ? ?Labs  ?  ?High Sensitivity Troponin:   ?Recent Labs  ?Lab 02/20/22 ?0135 02/20/22 ?0359  ?TROPONINIHS 4 3  ?   ?Chemistry ?Recent Labs  ?Lab 02/20/22 ?0135 02/20/22 ?0359 02/21/22 ?0141 02/22/22 ?0132  ?NA 138  --  138 138  ?K 2.5*  --  4.0 4.5  ?CL 101  --  107 110  ?CO2 22  --  24 22  ?GLUCOSE 175*  --  211* 158*  ?BUN 10  --  9 16  ?CREATININE 0.96  --  0.73 0.76  ?CALCIUM 9.4  --  9.1 9.2  ?MG  --  1.8  --  2.2  ?GFRNONAA >60  --  >60 >60  ?ANIONGAP 15  --  7 6  ?  ?Lipids No results for input(s): CHOL, TRIG, HDL, LABVLDL, LDLCALC, CHOLHDL in the last 168 hours.  ?Hematology ?Recent Labs  ?Lab 02/20/22 ?0135 02/21/22 ?0141 02/22/22 ?0132  ?WBC 10.9* 13.0* 15.1*  ?RBC 3.76* 3.62*  3.62*  ?HGB 10.2* 9.8* 9.8*  ?HCT 33.3* 31.7* 31.6*  ?MCV 88.6 87.6 87.3  ?MCH 27.1 27.1 27.1  ?MCHC 30.6 30.9 31.0  ?RDW 16.3* 16.5* 16.8*  ?PLT 470* 454* 496*  ? ?Thyroid No results for input(s): TSH, FREET4 in the last 168 hours.  ?BNP ?Recent Labs  ?Lab 02/20/22 ?0135  ?BNP 8.6  ?  ?DDimer No results for input(s): DDIMER in the last 168 hours.  ? ?Radiology  ?  ?No results found. ? ?Cardiac Studies  ? ? ? ?Patient Profile  ?   ?71 y.o. female with a hx of hypertension, hyperlipidemia, T2DM, asthma who is being seen 02/21/2022 for the evaluation of SVT  ? ?Assessment & Plan  ?  ?SVT: Having runs of narrow complex tachycardia with rate 140s.  Differential includes AVNRT, AT or 2:1 atrial flutter.  No clear flutter waves seen.  She is on Xarelto 10 mg daily for DVT ppx status post recent knee replacement ?-Avoid beta-blockers given admission with asthma exacerbation.  Started diltiazem 60 mg every 6 hours, plan to consolidate to once daily diltiazem prior to discharge ?-Echocardiogram ?-Currently in sinus rhythm.  If has another prolonged episode would  recommend giving adenosine, which should break rhythm if AVNRT but will see flutter waves if aflutter.  If aflutter, she will need to be on increased dose of xarelto ?  ? ?For questions or updates, please contact [place] ?Please consult [email or web address] for contact info under  ? ?  ?   ?Signed, ?Donato Heinz, MD  ?02/22/2022, 1:06 PM    ?

## 2022-02-22 NOTE — Progress Notes (Signed)
?PROGRESS NOTE ? ? ? ?Lauren Lopez  ZWC:[id number] DOB: 08-26-1951 DOA: 02/20/2022 ?PCP: Unk Pinto, MD  ? ? ?Chief Complaint  ?Patient presents with  ? Shortness of Breath  ? ? ?Brief Narrative:  ? ?Lauren Lopez is a 71 year old female with H/oh DM 2, HTN, HL, asthma and GERD, coming with acute asthma exacerbation with acute respiratory failure requiring BiPAP, with hypokalemia of 2.5, status post Solu-Medrol, nebulized bronchodilator therapy and magnesium with improvement initially however with atrial tachycardia that later improved also,  however when patient walked to the bathroom she had recurrent worsening dyspnea and wheezing again requiring BiPAP, her respiratory status has improved with no further requirement of BiPAP, she was noted to have SVT. ?  ?Assessment & Plan: ?  ?Principal Problem: ?  Acute asthma exacerbation ?Active Problems: ?  Essential hypertension ?  Obesity (BMI 30.0-34.9) ?  Hyperlipidemia associated with type 2 diabetes mellitus ([place]) ?  Type 2 diabetes mellitus with stage 2 chronic kidney disease, without long-term current use of insulin ([place]) ?  Glaucoma ?  Prolonged QT interval ?  S/P knee replacement ?  DNR (do not resuscitate) ?  SVT (supraventricular tachycardia) ([place]) ? ? ?Acute asthma exacerbation ?-Patient with recent R TKR and prior COVID infection with persistent cough since ?-She has had a couple of coughing episodes this week and became SOB this AM ?-No PE on CTA and she is on Xarelto due to recent knee replacement ?-Apparent bronchitis on CT, treated earlier this week with azithromycin without improvement ?-Suspect bronchitic asthma from a URI as her current issue ?-Obesity and immobility may also be contributing ?-CXR negative for PNA ?-On IV steroids for asthma Sister patient, this has improved, wheezing has subsided today, no respiratory distress, will transition to oral prednisone.  . ?-No antibiotics at this time  ?-Received magnesium in the ER ?-Continue  Advair ([place] per formulary) ?  ?S/P knee replacement ?-recent knee replacement ?-She needs to mobilize when possible but not at the risk of respiratory distress ?-Continue Xarelto ?-Consult PT/OT ?  ?Arrhythmias/SVT  ?-Patient was monitored on telemetry since admission as she had prolonged QTc, is having few bursts of tachycardia where her heart rate shoots up to 140s 150s even at rest. ?-Cardiology input greatly appreciated, started on Cardizem scheduled . ?-Follow on 2D echo.   ? ?Glaucoma ?-Continue latanoprost ?  ?Type 2 diabetes mellitus with stage 2 chronic kidney disease, without long-term current use of insulin ([place]) ?-Recent A1c was 6.5, indicating good control ?-hold Glucophage ?-Cover with moderate-scale SSI ?  ?Hyperlipidemia associated with type 2 diabetes mellitus ([place]) ?-Continue Crestor ?  ?Obesity (BMI 30.0-34.9) ?-Weight loss should be encouraged ?-Outpatient PCP/bariatric medicine f/u encouraged ?  ?Essential hypertension ?-She is taking HCTZ currently on hold, she is on Cardizem currently ? ?Obesity with BMI of 33 ? ?Hypokalemia ?- repleted ? ?  ?  ?  ? ?DVT prophylaxis:Xarelto ?Code Status: Full, this was confirmed by the patient today. ?Family Communication: None at bedside ?Disposition:  ? ?Status is: Inpatient ?Remains inpatient appropriate because: Remains with significant arrhythmias, on steroids for asthma treatment. ?  ?Consultants:  ?Cardiology ? ?Subjective: ? ?Ports her dyspnea and cough has improved, she denies any chest pain ? ?Objective: ?Vitals:  ? 02/22/22 0359 02/22/22 0400 02/22/22 0815 02/22/22 0825  ?BP: 115/82 118/80 120/76   ?Pulse: 99 (!) 110 (!) 122   ?Resp: '16 17 17   '$ ?Temp: 97.8 ?F (36.6 ?C)  98.5 ?F (36.9 ?C)   ?TempSrc: Oral  Oral   ?SpO2: 98% 95% 100% 98%  ?Weight:      ? ? ?Intake/Output Summary (Last 24 hours) at 02/22/2022 1141 ?Last data filed at 02/21/2022 1720 ?Gross per 24 hour  ?Intake 240 ml  ?Output --  ?Net 240 ml  ? ?Filed Weights  ? 02/21/22 0402 02/22/22  0203  ?Weight: 84.8 kg 85.1 kg  ? ? ?Examination: ? ?Awake Alert, Oriented X 3, No new F.N deficits, Normal affect ?Symmetrical Chest wall movement, Good air movement bilaterally, CTAB ?Tachycardic.,No Gallops,Rubs or new Murmurs, No Parasternal Heave ?+ve B.Sounds, Abd Soft, No tenderness, No rebound - guarding or rigidity. ?No Cyanosis, Clubbing or edema, No new Rash or bruise   ? ? ?Data Reviewed: I have personally reviewed following labs and imaging studies ? ?CBC: ?Recent Labs  ?Lab 02/20/22 ?0135 02/21/22 ?0141 02/22/22 ?0132  ?WBC 10.9* 13.0* 15.1*  ?HGB 10.2* 9.8* 9.8*  ?HCT 33.3* 31.7* 31.6*  ?MCV 88.6 87.6 87.3  ?PLT 470* 454* 496*  ? ? ?Basic Metabolic Panel: ?Recent Labs  ?Lab 02/20/22 ?0135 02/20/22 ?0359 02/21/22 ?0141 02/22/22 ?0132  ?NA 138  --  138 138  ?K 2.5*  --  4.0 4.5  ?CL 101  --  107 110  ?CO2 22  --  24 22  ?GLUCOSE 175*  --  211* 158*  ?BUN 10  --  9 16  ?CREATININE 0.96  --  0.73 0.76  ?CALCIUM 9.4  --  9.1 9.2  ?MG  --  1.8  --  2.2  ? ? ?GFR: ?Estimated Creatinine Clearance: 67.7 mL/min (by C-G formula based on SCr of 0.76 mg/dL). ? ?Liver Function Tests: ?No results for input(s): AST, ALT, ALKPHOS, BILITOT, PROT, ALBUMIN in the last 168 hours. ? ?CBG: ?Recent Labs  ?Lab 02/21/22 ?1155 02/21/22 ?1631 02/21/22 ?2012 02/22/22 ?0357 02/22/22 ?0820  ?GLUCAP 176* 166* 179* 198* 199*  ? ? ? ?Recent Results (from the past 240 hour(s))  ?Resp Panel by RT-PCR (Flu A&B, Covid) Nasopharyngeal Swab     Status: None  ? Collection Time: 02/20/22  2:50 AM  ? Specimen: Nasopharyngeal Swab; Nasopharyngeal(NP) swabs in vial transport medium  ?Result Value Ref Range Status  ? SARS Coronavirus 2 by RT PCR NEGATIVE NEGATIVE Final  ?  Comment: (NOTE) ?SARS-CoV-2 target nucleic acids are NOT DETECTED. ? ?The SARS-CoV-2 RNA is generally detectable in upper respiratory ?specimens during the acute phase of infection. The lowest ?concentration of SARS-CoV-2 viral copies this assay can detect is ?138 copies/mL. A  negative result does not preclude SARS-Cov-2 ?infection and should not be used as the sole basis for treatment or ?other patient management decisions. A negative result may occur with  ?improper specimen collection/handling, submission of specimen other ?than nasopharyngeal swab, presence of viral mutation(s) within the ?areas targeted by this assay, and inadequate number of viral ?copies(<138 copies/mL). A negative result must be combined with ?clinical observations, patient history, and epidemiological ?information. The expected result is Negative. ? ?Fact Sheet for Patients:  ?EntrepreneurPulse.com.au ? ?Fact Sheet for Healthcare Providers:  ?IncredibleEmployment.be ? ?This test is no t yet approved or cleared by the [place] FDA and  ?has been authorized for detection and/or diagnosis of SARS-CoV-2 by ?FDA under an Emergency Use Authorization (EUA). This EUA will remain  ?in effect (meaning this test can be used) for the duration of the ?COVID-19 declaration under Section 564(b)(1) of the Act, 21 ?U.S.C.section 360bbb-3(b)(1), unless the authorization is terminated  ?or revoked sooner.  ? ? ?  ?  Influenza A by PCR NEGATIVE NEGATIVE Final  ? Influenza B by PCR NEGATIVE NEGATIVE Final  ?  Comment: (NOTE) ?The Xpert Xpress SARS-CoV-2/FLU/RSV plus assay is intended as an aid ?in the diagnosis of influenza from Nasopharyngeal swab specimens and ?should not be used as a sole basis for treatment. Nasal washings and ?aspirates are unacceptable for Xpert Xpress SARS-CoV-2/FLU/RSV ?testing. ? ?Fact Sheet for Patients: ?EntrepreneurPulse.com.au ? ?Fact Sheet for Healthcare Providers: ?IncredibleEmployment.be ? ?This test is not yet approved or cleared by the [place] FDA and ?has been authorized for detection and/or diagnosis of SARS-CoV-2 by ?FDA under an Emergency Use Authorization (EUA). This EUA will remain ?in effect (meaning this test can  be used) for the duration of the ?COVID-19 declaration under Section 564(b)(1) of the Act, 21 U.S.C. ?section 360bbb-3(b)(1), unless the authorization is terminated or ?revoked. ? ?Performed at [facility]

## 2022-02-22 NOTE — Progress Notes (Signed)
Initial Nutrition Assessment ? ?DOCUMENTATION CODES:  ? ?Obesity unspecified ? ?INTERVENTION:  ?Ensure Enlive po BID, each supplement provides 350 kcal and 20 grams of protein. ? ?Encourage adequate PO intake.  ? ?NUTRITION DIAGNOSIS:  ? ?Increased nutrient needs related to acute illness as evidenced by estimated needs. ? ?GOAL:  ? ?Patient will meet greater than or equal to 90% of their needs ? ?MONITOR:  ? ?PO intake, Supplement acceptance, Skin, Weight trends, I & O's, Labs ? ?REASON FOR ASSESSMENT:  ? ?Consult ?Assessment of nutrition requirement/status ? ?ASSESSMENT:  ? ?71 y.o. female with medical history significant of HTN; HLD; DM; chronic back pain; and asthma presenting with SOB. Pt with acute asthma exacerbation. ? ?Pt unavailable during attempted time of contact. Meal completion has been 50-70%. Pt with no significant weight loss per weight records. RD to order nutritional supplements to aid in caloric and protein needs. Unable to complete Nutrition-Focused physical exam at this time.  ? ?Labs and medications reviewed.  ? ?Diet Order:   ?Diet Order   ? ?       ?  Diet Carb Modified Fluid consistency: Thin; Room service appropriate? Yes  Diet effective now       ?  ? ?  ?  ? ?  ? ? ?EDUCATION NEEDS:  ? ?Not appropriate for education at this time ? ?Skin:  Skin Assessment: Skin Integrity Issues: ?Skin Integrity Issues:: Incisions ?Incisions: R knee ? ?Last BM:  Unknown ? ?Height:  ? ?Ht Readings from Last 1 Encounters:  ?02/02/22 '5\' 3"'$  (1.6 m)  ? ? ?Weight:  ? ?Wt Readings from Last 1 Encounters:  ?02/22/22 85.1 kg  ? ?BMI:  Body mass index is 33.23 kg/m?. ? ?Estimated Nutritional Needs:  ? ?Kcal:  1800-2000 ? ?Protein:  85-100 grams ? ?Fluid:  >/= 1.8 L/day ? ?Corrin Parker, MS, RD, LDN ?RD pager number/after hours weekend pager number on Amion. ?

## 2022-02-22 NOTE — Progress Notes (Signed)
Pt has PRN BiPAP orders, no distress noted at this time. ?

## 2022-02-23 ENCOUNTER — Inpatient Hospital Stay (HOSPITAL_COMMUNITY): Payer: Medicare HMO

## 2022-02-23 DIAGNOSIS — R0609 Other forms of dyspnea: Secondary | ICD-10-CM

## 2022-02-23 LAB — CBC
HCT: 31.8 % — ABNORMAL LOW (ref 36.0–46.0)
Hemoglobin: 9.7 g/dL — ABNORMAL LOW (ref 12.0–15.0)
MCH: 26.6 pg (ref 26.0–34.0)
MCHC: 30.5 g/dL (ref 30.0–36.0)
MCV: 87.1 fL (ref 80.0–100.0)
Platelets: 462 10*3/uL — ABNORMAL HIGH (ref 150–400)
RBC: 3.65 MIL/uL — ABNORMAL LOW (ref 3.87–5.11)
RDW: 16.6 % — ABNORMAL HIGH (ref 11.5–15.5)
WBC: 11.6 10*3/uL — ABNORMAL HIGH (ref 4.0–10.5)
nRBC: 0 % (ref 0.0–0.2)

## 2022-02-23 LAB — ECHOCARDIOGRAM COMPLETE
Area-P 1/2: 4.86 cm2
S' Lateral: 3.1 cm
Weight: 3019.42 oz

## 2022-02-23 LAB — BASIC METABOLIC PANEL
Anion gap: 6 (ref 5–15)
BUN: 18 mg/dL (ref 8–23)
CO2: 22 mmol/L (ref 22–32)
Calcium: 8.9 mg/dL (ref 8.9–10.3)
Chloride: 110 mmol/L (ref 98–111)
Creatinine, Ser: 0.8 mg/dL (ref 0.44–1.00)
GFR, Estimated: >60 mL/min (ref >60)
Glucose, Bld: 176 mg/dL — ABNORMAL HIGH (ref 70–99)
Potassium: 4.3 mmol/L (ref 3.5–5.1)
Sodium: 138 mmol/L (ref 135–145)

## 2022-02-23 LAB — GLUCOSE, CAPILLARY
Glucose-Capillary: 148 mg/dL — ABNORMAL HIGH (ref 70–99)
Glucose-Capillary: 213 mg/dL — ABNORMAL HIGH (ref 70–99)
Glucose-Capillary: 188 mg/dL — ABNORMAL HIGH (ref 70–99)
Glucose-Capillary: 192 mg/dL — ABNORMAL HIGH (ref 70–99)

## 2022-02-23 LAB — TSH: TSH: 0.277 u[IU]/mL — ABNORMAL LOW (ref 0.350–4.500)

## 2022-02-23 MED ORDER — DILTIAZEM HCL ER COATED BEADS 240 MG PO CP24
240.0000 mg | ORAL_CAPSULE | Freq: Every day | ORAL | Status: DC
  Administered 2022-02-23 – 2022-02-24 (×2): 240 mg via ORAL
  Filled 2022-02-23 (×2): qty 1

## 2022-02-23 NOTE — Progress Notes (Signed)
Pt has PRN BIPAP order no distress noted at this time. ?

## 2022-02-23 NOTE — Progress Notes (Signed)
Occupational Therapy Treatment ?Patient Details ?Name: Lauren Lopez ?MRN: [id number] ?DOB: 1951/07/23 ?Today's Date: 02/23/2022 ? ? ?History of present illness Pt is a 71 y.o. female presenting to ED 02/20/22 due to SOB. Imaging positive for lower lobe and R middle lobe central brachial thickening. Per notes, likely from asthma exacerbation. PMH includes DM2, HTN, HLD, CKD, and s/p R TKA (March 2023). ?  ?OT comments ? Patient progressing and showed improved tolerance to all activity with HR remaining no higher than 88 BPM with activity and down to 79 at rest, compared to previous session.  Pt now able to tolerate ambulation in room, using RW to get to bathroom and back and walk within her room with supervision only. RW used more for RT knee pain management than balance impairments.  Patient remains limited by post-op pain and decreased activity tolerance along with deficits noted below. While pt has progressed significantly towards OT goals, will keep pt on service to ensure consistency with functional ADLs.  Pt continues to demonstrate very good rehab potential and would benefit from continued skilled acute OT to increase safety and independence with ADLs and functional transfers to allow pt to return home safely and reduce caregiver burden and fall risk. ?  ? ?Recommendations for follow up therapy are one component of a multi-disciplinary discharge planning process, led by the attending physician.  Recommendations may be updated based on patient status, additional functional criteria and insurance authorization. ?   ?Follow Up Recommendations ? No OT follow up (Possible cardiac rehab)  ?  ?Assistance Recommended at Discharge PRN  ?Patient can return home with the following ? Assistance with cooking/housework ?  ?Equipment Recommendations ? None recommended by OT  ?  ?Recommendations for Other Services   ? ?  ?Precautions / Restrictions Precautions ?Precautions: Fall ?Precaution Comments: recent R  TKA ?Restrictions ?Weight Bearing Restrictions: No  ? ? ?  ? ?Mobility Bed Mobility ?  ?  ?  ?  ?  ?  ?  ?General bed mobility comments: up in recliner. Transffered to EOB for ADLs but did not move into supine positoin. ?  ? ?Transfers ?Overall transfer level: Needs assistance ?Equipment used: Rolling walker (2 wheels) ?Transfers: Sit to/from Stand ?Sit to Stand: Supervision ?  ?  ?  ?  ?  ?General transfer comment: Pt cued to use RW due to 5/10 RT knee pain and pt agreeable.  Pt ambulated with supervision and RW ~20' x 2.  Cues to keep head upright for safety. ?  ?  ?Balance Overall balance assessment: Needs assistance ?Sitting-balance support: Bilateral upper extremity supported, Feet supported ?Sitting balance-Leahy Scale: Good ?  ?  ?Standing balance support: During functional activity ?Standing balance-Leahy Scale: Fair ?  ?  ?  ?  ?  ?  ?  ?  ?  ?  ?  ?  ?   ? ?ADL either performed or assessed with clinical judgement  ? ?ADL Overall ADL's : Needs assistance/impaired ?  ?  ?Grooming: Standing;Supervision/safety;Wash/dry face ?  ?  ?  ?  ?  ?  ?  ?Lower Body Dressing: Set up;Sitting/lateral leans ?Lower Body Dressing Details (indicate cue type and reason): Pt able to sit EOB and use compensatory strategies on her RT LE to don and doff each sock with setup only. ?Toilet Transfer: Supervision/safety;Comfort height toilet;Ambulation;Rolling walker (2 wheels) ?Toilet Transfer Details (indicate cue type and reason): Pt stood from recliner to [place] with superiviosn and ambulated to bathroom. Cues for safety and  pt able to demonstrate on and off commode with supervision. ?  ?Toileting - Clothing Manipulation Details (indicate cue type and reason): Pt demonstrated toilet transfer but denied need to void. ?  ?  ?  ?General ADL Comments: HR maitained in 80s throughout session and 79 at rest. SpO2 99% and above 95% throughout session on room air. RR 20. ?  ? ?Extremity/Trunk Assessment Upper Extremity Assessment ?Upper  Extremity Assessment: Overall WFL for tasks assessed ?  ?Lower Extremity Assessment ?Lower Extremity Assessment: Defer to PT evaluation ?RLE Deficits / Details: recent R TKR.  Noted edema surrounding RT knee and RLE was elevated with folding pillow under ankle. Pt ed on ankle pumps. ?  ?Cervical / Trunk Assessment ?Cervical / Trunk Assessment: Normal ?  ? ?Vision Baseline Vision/History: 1 Wears glasses ?Ability to See in Adequate Light: 0 Adequate ?Vision Assessment?: No apparent visual deficits ?  ?Perception   ?  ?Praxis   ?  ? ?Cognition Arousal/Alertness: Awake/alert ?Behavior During Therapy: [facility] for tasks assessed/performed ?Overall Cognitive Status: Within Functional Limits for tasks assessed ?  ?  ?  ?  ?  ?  ?  ?  ?  ?  ?  ?  ?  ?  ?  ?  ?General Comments: AxO x 3 very pleasant ?  ?  ?   ?Exercises   ? ?  ?Shoulder Instructions   ? ? ?  ?General Comments    ? ? ?Pertinent Vitals/ Pain       Pain Assessment ?Pain Assessment: 0-10 ?Pain Score: 5  ?Pain Location: right knee.  At rest in  recliner. After in-room ambulation, pt repored pain had lessened and was likely just stiff. ?Pain Intervention(s): Limited activity within patient's tolerance, Monitored during session, Repositioned (elevated) ? ?Home Living   ?  ?  ?  ?  ?  ?  ?  ?  ?  ?  ?  ?  ?  ?  ?  ?  ?  ?  ? ?  ?Prior Functioning/Environment    ?  ?  ?  ?   ? ?Frequency ? Min 2X/week  ? ? ? ? ?  ?Progress Toward Goals ? ?OT Goals(current goals can now be found in the care plan section) ? Progress towards OT goals: Progressing toward goals ? ?Acute Rehab OT Goals ?Patient Stated Goal: To wean self off RW now that heart rate has improved. ?OT Goal Formulation: With patient ?Time For Goal Achievement: 03/07/22 ?Potential to Achieve Goals: Good  ?Plan Discharge plan remains appropriate   ? ?Co-evaluation ? ? ?   ?  ?  ?  ?  ? ?  ?AM-PAC OT "6 Clicks" Daily Activity     ?Outcome Measure ? ? Help from another person eating meals?: None ?Help from another  person taking care of personal grooming?: None ?Help from another person toileting, which includes using toliet, bedpan, or urinal?: A Little ?Help from another person bathing (including washing, rinsing, drying)?: A Little ?Help from another person to put on and taking off regular upper body clothing?: None ?Help from another person to put on and taking off regular lower body clothing?: A Little ?6 Click Score: 21 ? ?  ?End of Session Equipment Utilized During Treatment: Rolling walker (2 wheels) ? ?OT Visit Diagnosis: Unsteadiness on feet (R26.81);Pain;Other (comment) ?Pain - Right/Left: Right ?Pain - part of body: Knee ?  ?Activity Tolerance Patient tolerated treatment well;No increased pain ?  ?Patient Left in chair;with call  bell/phone within reach ?  ?Nurse Communication Other (comment) (Cleared OT to see pt) ?  ? ?   ? ?Time: 9833-8250 ?OT Time Calculation (min): 29 min ? ?Charges: OT General Charges ?$OT Visit: 1 Visit ?OT Treatments ?$Self Care/Home Management : 8-22 mins ?$Therapeutic Activity: 8-22 mins ? ?Anderson Malta, OT ?Acute Rehab Services ?Office: [phone] ?02/23/2022 ? ?Julien Girt ?02/23/2022, 10:45 AM ?

## 2022-02-23 NOTE — Progress Notes (Signed)
?PROGRESS NOTE ? ? ? ?Lauren Lopez  OTL:[id number] DOB: [birth year] DOA: 02/20/2022 ?PCP: Unk Pinto, MD  ? ? ?Chief Complaint  ?Patient presents with  ? Shortness of Breath  ? ? ?Brief Narrative:  ? ?Lauren Lopez is a 71 year old female with H/oh DM 2, HTN, HL, asthma and GERD, coming with acute asthma exacerbation with acute respiratory failure requiring BiPAP, with hypokalemia of 2.5, status post Solu-Medrol, nebulized bronchodilator therapy and magnesium with improvement initially however with atrial tachycardia that later improved also,  however when patient walked to the bathroom she had recurrent worsening dyspnea and wheezing again requiring BiPAP, her respiratory status has improved with no further requirement of BiPAP, she was noted to have SVT. ?  ?Assessment & Plan: ?  ?Principal Problem: ?  Acute asthma exacerbation ?Active Problems: ?  Essential hypertension ?  Obesity (BMI 30.0-34.9) ?  Hyperlipidemia associated with type 2 diabetes mellitus ([place]) ?  Type 2 diabetes mellitus with stage 2 chronic kidney disease, without long-term current use of insulin ([place]) ?  Glaucoma ?  Prolonged QT interval ?  S/P knee replacement ?  SVT (supraventricular tachycardia) ([place]) ? ? ?Acute asthma exacerbation ?-Patient with recent R TKR and prior COVID infection with persistent cough since ?-She has had a couple of coughing episodes this week and became SOB this AM ?-No PE on CTA and she is on Xarelto due to recent knee replacement ?-Apparent bronchitis on CT, treated earlier this week with azithromycin without improvement ?-Suspect bronchitic asthma from a URI as her current issue ?-Obesity and immobility may also be contributing ?-CXR negative for PNA ?-Requiring IV steroids for asthma exacerbation, this has improved, she is currently on steroid taper.   ?-No antibiotics at this time  ?-Received magnesium in the ER ?-Continue Advair ([place] per formulary) ?  ?S/P knee replacement ?-recent knee  replacement ?-She needs to mobilize when possible but not at the risk of respiratory distress ?-Continue Xarelto ?-Consult PT/OT ?  ?Arrhythmias/SVT  ?-Patient was monitored on telemetry since admission as she had prolonged QTc, is having few bursts of tachycardia where her heart rate shoots up to 140s 150s even at rest. ?-Cardiology input greatly appreciated, started on Cardizem scheduled .  Heart rate has improved, currently on Cardizem CD ?-2D echo is pending. ? ?Glaucoma ?-Continue latanoprost ?  ?Type 2 diabetes mellitus with stage 2 chronic kidney disease, without long-term current use of insulin ([place]) ?-Recent A1c was 6.5, indicating good control ?-hold Glucophage ?-Cover with moderate-scale SSI ?  ?Hyperlipidemia associated with type 2 diabetes mellitus ([place]) ?-Continue Crestor ?  ?Obesity (BMI 30.0-34.9) ?-Weight loss should be encouraged ?-Outpatient PCP/bariatric medicine f/u encouraged ?  ?Essential hypertension ?-She is taking HCTZ currently on hold, she is on Cardizem currently ? ?Obesity with BMI of 33 ? ?Hypokalemia ?- repleted ? ?  ?  ?  ? ?DVT prophylaxis:Xarelto ?Code Status: Full ?Family Communication: None at bedside ?Disposition:  ? ?Status is: Inpatient ?Remains inpatient appropriate because: Remains with significant arrhythmias, on steroids for asthma treatment. ?  ?Consultants:  ?Cardiology ? ?Subjective: ? ?Ports her dyspnea and cough has improved, she denies any chest pain ? ?Objective: ?Vitals:  ? 02/23/22 0500 02/23/22 0531 02/23/22 0744 02/23/22 1207  ?BP:  119/75 (!) 125/97 124/82  ?Pulse: 62 69 60 100  ?Resp: '14 16 18 18  '$ ?Temp:   98.3 ?F (36.8 ?C) 98.7 ?F (37.1 ?C)  ?TempSrc:   Oral Oral  ?SpO2: 95% 100% 100% 100%  ?Weight:      ? ? ?  Intake/Output Summary (Last 24 hours) at 02/23/2022 1444 ?Last data filed at 02/23/2022 0438 ?Gross per 24 hour  ?Intake 360 ml  ?Output 150 ml  ?Net 210 ml  ? ?Filed Weights  ? 02/21/22 0402 02/22/22 0203 02/23/22 0148  ?Weight: 84.8 kg 85.1 kg 85.6 kg   ? ? ?Examination: ? ?Awake Alert, Oriented X 3, No new F.N deficits, Normal affect ?Symmetrical Chest wall movement, Good air movement bilaterally, CTAB ?RRR,No Gallops,Rubs or new Murmurs, No Parasternal Heave ?+ve B.Sounds, Abd Soft, No tenderness, No rebound - guarding or rigidity. ?No Cyanosis, Clubbing or edema, right knee s/p recent TKA ? ? ? ?Data Reviewed: I have personally reviewed following labs and imaging studies ? ?CBC: ?Recent Labs  ?Lab 02/20/22 ?0135 02/21/22 ?0141 02/22/22 ?0132 02/23/22 ?1791  ?WBC 10.9* 13.0* 15.1* 11.6*  ?HGB 10.2* 9.8* 9.8* 9.7*  ?HCT 33.3* 31.7* 31.6* 31.8*  ?MCV 88.6 87.6 87.3 87.1  ?PLT 470* 454* 496* 462*  ? ? ?Basic Metabolic Panel: ?Recent Labs  ?Lab 02/20/22 ?0135 02/20/22 ?0359 02/21/22 ?0141 02/22/22 ?0132 02/23/22 ?5056  ?NA 138  --  138 138 138  ?K 2.5*  --  4.0 4.5 4.3  ?CL 101  --  107 110 110  ?CO2 22  --  '24 22 22  '$ ?GLUCOSE 175*  --  211* 158* 176*  ?BUN 10  --  '9 16 18  '$ ?CREATININE 0.96  --  0.73 0.76 0.80  ?CALCIUM 9.4  --  9.1 9.2 8.9  ?MG  --  1.8  --  2.2  --   ? ? ?GFR: ?Estimated Creatinine Clearance: 67.9 mL/min (by C-G formula based on SCr of 0.8 mg/dL). ? ?Liver Function Tests: ?No results for input(s): AST, ALT, ALKPHOS, BILITOT, PROT, ALBUMIN in the last 168 hours. ? ?CBG: ?Recent Labs  ?Lab 02/22/22 ?1235 02/22/22 ?1612 02/22/22 ?1945 02/23/22 ?0747 02/23/22 ?1206  ?GLUCAP 247* 259* 184* 148* 213*  ? ? ? ?Recent Results (from the past 240 hour(s))  ?Resp Panel by RT-PCR (Flu A&B, Covid) Nasopharyngeal Swab     Status: None  ? Collection Time: 02/20/22  2:50 AM  ? Specimen: Nasopharyngeal Swab; Nasopharyngeal(NP) swabs in vial transport medium  ?Result Value Ref Range Status  ? SARS Coronavirus 2 by RT PCR NEGATIVE NEGATIVE Final  ?  Comment: (NOTE) ?SARS-CoV-2 target nucleic acids are NOT DETECTED. ? ?The SARS-CoV-2 RNA is generally detectable in upper respiratory ?specimens during the acute phase of infection. The lowest ?concentration of SARS-CoV-2  viral copies this assay can detect is ?138 copies/mL. A negative result does not preclude SARS-Cov-2 ?infection and should not be used as the sole basis for treatment or ?other patient management decisions. A negative result may occur with  ?improper specimen collection/handling, submission of specimen other ?than nasopharyngeal swab, presence of viral mutation(s) within the ?areas targeted by this assay, and inadequate number of viral ?copies(<138 copies/mL). A negative result must be combined with ?clinical observations, patient history, and epidemiological ?information. The expected result is Negative. ? ?Fact Sheet for Patients:  ?EntrepreneurPulse.com.au ? ?Fact Sheet for Healthcare Providers:  ?IncredibleEmployment.be ? ?This test is no t yet approved or cleared by the [place] FDA and  ?has been authorized for detection and/or diagnosis of SARS-CoV-2 by ?FDA under an Emergency Use Authorization (EUA). This EUA will remain  ?in effect (meaning this test can be used) for the duration of the ?COVID-19 declaration under Section 564(b)(1) of the Act, 21 ?U.S.C.section 360bbb-3(b)(1), unless the authorization is terminated  ?or revoked  sooner.  ? ? ?  ? Influenza A by PCR NEGATIVE NEGATIVE Final  ? Influenza B by PCR NEGATIVE NEGATIVE Final  ?  Comment: (NOTE) ?The Xpert Xpress SARS-CoV-2/FLU/RSV plus assay is intended as an aid ?in the diagnosis of influenza from Nasopharyngeal swab specimens and ?should not be used as a sole basis for treatment. Nasal washings and ?aspirates are unacceptable for Xpert Xpress SARS-CoV-2/FLU/RSV ?testing. ? ?Fact Sheet for Patients: ?EntrepreneurPulse.com.au ? ?Fact Sheet for Healthcare Providers: ?IncredibleEmployment.be ? ?This test is not yet approved or cleared by the [place] FDA and ?has been authorized for detection and/or diagnosis of SARS-CoV-2 by ?FDA under an Emergency Use Authorization  (EUA). This EUA will remain ?in effect (meaning this test can be used) for the duration of the ?COVID-19 declaration under Section 564(b)(1) of the Act, 21 U.S.C. ?section 360bbb-3(b)(1), unless the authorization is

## 2022-02-23 NOTE — Progress Notes (Signed)
?  Echocardiogram ?2D Echocardiogram has been performed. ? ?Lauren Lopez ?02/23/2022, 12:51 PM ?

## 2022-02-23 NOTE — Progress Notes (Signed)
Physical Therapy Treatment ?Patient Details ?Name: Lauren Lopez ?MRN: [id number] ?DOB: 20-Apr-1951 ?Today's Date: 02/23/2022 ? ? ?History of Present Illness Pt is a 71 y.o. female presenting to ED 02/20/22 due to SOB. Imaging positive for lower lobe and R middle lobe central brachial thickening. Per notes, likely from asthma exacerbation. PMH includes DM2, HTN, HLD, CKD, and s/p R TKA (March 2023). ? ?  ?PT Comments  ? ? Patient did well with walking x 400 ft with RW and max HR 100 (resting HR 76). Had done some of her TKA exercises earlier today and completed exercises with PT.   ?Recommendations for follow up therapy are one component of a multi-disciplinary discharge planning process, led by the attending physician.  Recommendations may be updated based on patient status, additional functional criteria and insurance authorization. ? ?Follow Up Recommendations ? Outpatient PT (resumption) ?  ?  ?Assistance Recommended at Discharge Intermittent Supervision/Assistance  ?Patient can return home with the following Assistance with cooking/housework;Assist for transportation;Help with stairs or ramp for entrance ?  ?Equipment Recommendations ? None recommended by PT  ?  ?Recommendations for Other Services   ? ? ?  ?Precautions / Restrictions Precautions ?Precautions: Fall ?Precaution Comments: recent R TKA ?Restrictions ?Weight Bearing Restrictions: No  ?  ? ?Mobility ? Bed Mobility ?Overal bed mobility: Independent ?Bed Mobility: Supine to Sit ?  ?  ?Supine to sit: Independent ?  ?  ?  ?  ? ?Transfers ?Overall transfer level: Modified independent ?Equipment used: Rolling walker (2 wheels), Rollator (4 wheels) ?Transfers: Sit to/from Stand ?Sit to Stand: Modified independent (Device/Increase time) ?  ?  ?  ?  ?  ?General transfer comment: using RW due to knee pain/stiffness ?  ? ?Ambulation/Gait ?Ambulation/Gait assistance: Supervision ?Gait Distance (Feet): 400 Feet ?Assistive device: Rolling walker (2 wheels) ?Gait  Pattern/deviations: Decreased stance time - right, Decreased stride length, Step-through pattern ?Gait velocity: decreased ?  ?  ?General Gait Details: supervision for lines and to monitor HR response ? ? ?Stairs ?  ?  ?  ?  ?  ? ? ?Wheelchair Mobility ?  ? ?Modified Rankin (Stroke Patients Only) ?  ? ? ?  ?Balance Overall balance assessment: Needs assistance ?Sitting-balance support: Bilateral upper extremity supported, Feet supported ?Sitting balance-Leahy Scale: Good ?Sitting balance - Comments: pt used BUE support in sitting, however, pt not requiring UE support for stability. ?  ?Standing balance support: During functional activity ?Standing balance-Leahy Scale: Fair ?Standing balance comment: pt able to shift weight during gait without external support ?  ?  ?  ?  ?  ?  ?  ?  ?  ?  ?  ?  ? ?  ?Cognition Arousal/Alertness: Awake/alert ?Behavior During Therapy: [facility] for tasks assessed/performed ?Overall Cognitive Status: Within Functional Limits for tasks assessed ?  ?  ?  ?  ?  ?  ?  ?  ?  ?  ?  ?  ?  ?  ?  ?  ?General Comments: AxO x 3 very pleasant ?  ?  ? ?  ?Exercises General Exercises - Lower Extremity ?Ankle Circles/Pumps: AROM, Both, Seated, 10 reps ?Quad Sets: AROM, Both, 10 reps, Supine (3 reps) ?Heel Slides: AROM, AAROM, Right, 10 reps, Supine ?Other Exercises ?Other Exercises: seated knee flexion with foot on washcloth, sliding foot forward and backward x 15 reps ?Other Exercises: pt reported she did standing exercises earlier today when washing up at the sink ? ?  ?General Comments General comments (skin  integrity, edema, etc.): HR max 100 (started at 76) ?  ?  ? ?Pertinent Vitals/Pain Pain Assessment ?Pain Assessment: 0-10 ?Pain Score: 2  ?Pain Location: right knee ?Pain Descriptors / Indicators: Discomfort, Sore ?Pain Intervention(s): Monitored during session, Limited activity within patient's tolerance, Repositioned  ? ? ?Home Living   ?  ?  ?  ?  ?  ?  ?  ?  ?  ?   ?  ?Prior Function    ?  ?  ?    ? ?PT Goals (current goals can now be found in the care plan section) Acute Rehab PT Goals ?Patient Stated Goal: go home ?Time For Goal Achievement: 03/06/22 ?Potential to Achieve Goals: Good ?Progress towards PT goals: Progressing toward goals ? ?  ?Frequency ? ? ? Min 3X/week ? ? ? ?  ?PT Plan Current plan remains appropriate  ? ? ?Co-evaluation   ?  ?  ?  ?  ? ?  ?AM-PAC PT "6 Clicks" Mobility   ?Outcome Measure ? Help needed turning from your back to your side while in a flat bed without using bedrails?: None ?Help needed moving from lying on your back to sitting on the side of a flat bed without using bedrails?: None ?Help needed moving to and from a bed to a chair (including a wheelchair)?: None ?Help needed standing up from a chair using your arms (e.g., wheelchair or bedside chair)?: None ?Help needed to walk in hospital room?: A Little ?Help needed climbing 3-5 steps with a railing? : A Lot ?6 Click Score: 21 ? ?  ?End of Session   ?Activity Tolerance: Patient tolerated treatment well ?Patient left: in chair;with call bell/phone within reach;with chair alarm set ?  ?PT Visit Diagnosis: Other abnormalities of gait and mobility (R26.89);Muscle weakness (generalized) (M62.81);Difficulty in walking, not elsewhere classified (R26.2);Pain ?Pain - Right/Left: Right ?Pain - part of body: Knee ?  ? ? ?Time: 4259-5638 ?PT Time Calculation (min) (ACUTE ONLY): 43 min ? ?Charges:  $Gait Training: 23-37 mins ?$Therapeutic Exercise: 8-22 mins          ?          ? ? ?Arby Barrette, PT ?Acute Rehabilitation Services  ?Pager [phone] ?Office [phone] ? ? ? ?Jeanie Cooks Rhet Rorke ?02/23/2022, 3:59 PM ? ?

## 2022-02-23 NOTE — Progress Notes (Signed)
? ?Progress Note ? ?Patient Name: Lauren Lopez ?Date of Encounter: 02/23/2022 ? ?[facility] Cardiologist: Donato Heinz, MD  ? ?Subjective  ? ?No chest pain or dyspnea ? ?Inpatient Medications  ?  ?Scheduled Meds: ? [START ON 02/25/2022] aspirin  81 mg Oral Daily  ? diltiazem  240 mg Oral Daily  ? docusate sodium  100 mg Oral BID  ? feeding supplement  237 mL Oral BID BM  ? fluticasone  2 spray Each Nare Daily  ? insulin aspart  0-15 Units Subcutaneous TID WC  ? insulin aspart  0-5 Units Subcutaneous QHS  ? latanoprost  1 drop Both Eyes QHS  ? loratadine  10 mg Oral Daily  ? mometasone-formoterol  2 puff Inhalation BID  ? pantoprazole  40 mg Oral Daily  ? predniSONE  40 mg Oral Q breakfast  ? rivaroxaban  10 mg Oral Daily  ? rosuvastatin  20 mg Oral Daily  ? sodium chloride flush  3 mL Intravenous Q12H  ? ?Continuous Infusions: ? ?PRN Meds: ?acetaminophen **OR** acetaminophen, bisacodyl, guaiFENesin, hydrALAZINE, HYDROcodone-acetaminophen, levalbuterol, methocarbamol, morphine injection, ondansetron **OR** ondansetron (ZOFRAN) IV, oxyCODONE, polyethylene glycol, traMADol  ? ?Vital Signs  ?  ?Vitals:  ? 02/23/22 0400 02/23/22 0500 02/23/22 0531 02/23/22 0744  ?BP: 109/65  119/75 (!) 125/97  ?Pulse: 62 62 69 60  ?Resp: '16 14 16 18  '$ ?Temp:    98.3 ?F (36.8 ?C)  ?TempSrc:    Oral  ?SpO2: 98% 95% 100% 100%  ?Weight:      ? ? ?Intake/Output Summary (Last 24 hours) at 02/23/2022 1118 ?Last data filed at 02/23/2022 0438 ?Gross per 24 hour  ?Intake 360 ml  ?Output 150 ml  ?Net 210 ml  ? ? ? ?  02/23/2022  ?  1:48 AM 02/22/2022  ?  2:03 AM 02/21/2022  ?  4:02 AM  ?Last 3 Weights  ?Weight (lbs) 188 lb 11.4 oz 187 lb 9.8 oz 186 lb 15.2 oz  ?Weight (kg) 85.6 kg 85.1 kg 84.8 kg  ?   ? ?Telemetry  ?  ?SVT 110-120s this morning, short episodes- Personally Reviewed ? ?ECG  ?  ?No new ECG - Personally Reviewed ? ?Physical Exam  ? ?GEN: No acute distress.   ?Neck: No JVD ?Cardiac: RRR, no murmurs, rubs, or gallops.   ?Respiratory: Clear to auscultation bilaterally. ?GI: Soft, nontender, non-distended  ?MS: No edema; No deformity. ?Neuro:  Nonfocal  ?Psych: Normal affect  ? ?Labs  ?  ?High Sensitivity Troponin:   ?Recent Labs  ?Lab 02/20/22 ?0135 02/20/22 ?0359  ?TROPONINIHS 4 3  ? ?   ?Chemistry ?Recent Labs  ?Lab 02/20/22 ?0359 02/21/22 ?0141 02/22/22 ?0132 02/23/22 ?0370  ?NA  --  138 138 138  ?K  --  4.0 4.5 4.3  ?CL  --  107 110 110  ?CO2  --  '24 22 22  '$ ?GLUCOSE  --  211* 158* 176*  ?BUN  --  '9 16 18  '$ ?CREATININE  --  0.73 0.76 0.80  ?CALCIUM  --  9.1 9.2 8.9  ?MG 1.8  --  2.2  --   ?GFRNONAA  --  >60 >60 >60  ?ANIONGAP  --  '7 6 6  '$ ? ?  ?Lipids No results for input(s): CHOL, TRIG, HDL, LABVLDL, LDLCALC, CHOLHDL in the last 168 hours.  ?Hematology ?Recent Labs  ?Lab 02/21/22 ?0141 02/22/22 ?0132 02/23/22 ?4888  ?WBC 13.0* 15.1* 11.6*  ?RBC 3.62* 3.62* 3.65*  ?HGB 9.8* 9.8* 9.7*  ?  HCT 31.7* 31.6* 31.8*  ?MCV 87.6 87.3 87.1  ?MCH 27.1 27.1 26.6  ?MCHC 30.9 31.0 30.5  ?RDW 16.5* 16.8* 16.6*  ?PLT 454* 496* 462*  ? ? ?Thyroid  ?Recent Labs  ?Lab 02/23/22 ?2248  ?TSH 0.277*  ?  ?BNP ?Recent Labs  ?Lab 02/20/22 ?0135  ?BNP 8.6  ? ?  ?DDimer No results for input(s): DDIMER in the last 168 hours.  ? ?Radiology  ?  ?No results found. ? ?Cardiac Studies  ? ? ? ?Patient Profile  ?   ?71 y.o. female with a hx of hypertension, hyperlipidemia, T2DM, asthma who is being seen 02/21/2022 for the evaluation of SVT  ? ?Assessment & Plan  ?  ?SVT: Having runs of narrow complex tachycardia with rate 140s.  Differential includes AVNRT, AT or 2:1 atrial flutter.  No clear flutter waves seen.  She is on Xarelto 10 mg daily for DVT ppx status post recent knee replacement ?-Avoid beta-blockers given admission with asthma exacerbation.  Started diltiazem 60 mg every 6 hours, will consolidate to 240 mg daily ?-Echocardiogram ?-Episodes appear improved this morning, shorter duration and rates 110s to 120s.  Does appear to be inverted P wave following T  wave, suspect this likely represents an atrial tachycardia ?  ? ?For questions or updates, please contact [place] ?Please consult [email or web address] for contact info under  ? ?  ?   ?Signed, ?Donato Heinz, MD  ?02/23/2022, 11:18 AM    ?

## 2022-02-24 ENCOUNTER — Inpatient Hospital Stay (HOSPITAL_BASED_OUTPATIENT_CLINIC_OR_DEPARTMENT_OTHER)
Admit: 2022-02-24 | Discharge: 2022-02-24 | Disposition: A | Discharge status: Home / Self Care | Payer: Medicare HMO | Attending: Cardiology | Admitting: Cardiology

## 2022-02-24 DIAGNOSIS — I471 Supraventricular tachycardia: Secondary | ICD-10-CM

## 2022-02-24 DIAGNOSIS — Z96651 Presence of right artificial knee joint: Secondary | ICD-10-CM

## 2022-02-24 LAB — BASIC METABOLIC PANEL
Anion gap: 8 (ref 5–15)
BUN: 17 mg/dL (ref 8–23)
CO2: 20 mmol/L — ABNORMAL LOW (ref 22–32)
Calcium: 8.7 mg/dL — ABNORMAL LOW (ref 8.9–10.3)
Chloride: 108 mmol/L (ref 98–111)
Creatinine, Ser: 0.74 mg/dL (ref 0.44–1.00)
GFR, Estimated: >60 mL/min (ref >60)
Glucose, Bld: 220 mg/dL — ABNORMAL HIGH (ref 70–99)
Potassium: 4 mmol/L (ref 3.5–5.1)
Sodium: 136 mmol/L (ref 135–145)

## 2022-02-24 LAB — CBC
HCT: 32.8 % — ABNORMAL LOW (ref 36.0–46.0)
Hemoglobin: 10.2 g/dL — ABNORMAL LOW (ref 12.0–15.0)
MCH: 27 pg (ref 26.0–34.0)
MCHC: 31.1 g/dL (ref 30.0–36.0)
MCV: 86.8 fL (ref 80.0–100.0)
Platelets: 438 10*3/uL — ABNORMAL HIGH (ref 150–400)
RBC: 3.78 MIL/uL — ABNORMAL LOW (ref 3.87–5.11)
RDW: 16.4 % — ABNORMAL HIGH (ref 11.5–15.5)
WBC: 10.9 10*3/uL — ABNORMAL HIGH (ref 4.0–10.5)
nRBC: 0 % (ref 0.0–0.2)

## 2022-02-24 LAB — GLUCOSE, CAPILLARY
Glucose-Capillary: 139 mg/dL — ABNORMAL HIGH (ref 70–99)
Glucose-Capillary: 273 mg/dL — ABNORMAL HIGH (ref 70–99)
Glucose-Capillary: 307 mg/dL — ABNORMAL HIGH (ref 70–99)

## 2022-02-24 MED ORDER — PREDNISONE 20 MG PO TABS: ORAL_TABLET | ORAL | 0 refills | Status: DC

## 2022-02-24 MED ORDER — DILTIAZEM HCL ER COATED BEADS 240 MG PO CP24: 240.0000 mg | ORAL_CAPSULE | Freq: Every day | ORAL | 0 refills | Status: DC

## 2022-02-24 NOTE — Progress Notes (Signed)
Inpatient Diabetes Program Recommendations ? ?AACE/ADA: New Consensus Statement on Inpatient Glycemic Control (2015) ? ?Target Ranges:  Prepandial:   less than 140 mg/dL ?     Peak postprandial:   less than 180 mg/dL (1-2 hours) ?     Critically ill patients:  140 - 180 mg/dL  ? ?Lab Results  ?Component Value Date  ? GLUCAP 139 (H) 02/24/2022  ? HGBA1C 6.5 (H) 12/09/2021  ? ? ?Review of Glycemic Control ? Latest Reference Range & Units 02/23/22 07:47 02/23/22 12:06 02/23/22 17:14 02/23/22 22:08 02/24/22 08:19  ?Glucose-Capillary 70 - 99 mg/dL 148 (H) 213 (H) 188 (H) 192 (H) 139 (H)  ? ?Diabetes history: DM 2 ?Outpatient Diabetes medications:  ?Metformin 500 mg daily ?Current orders for Inpatient glycemic control:  ?Novolog moderate tid with meals and HS ?Prednisone 40 mg daily  ?Inpatient Diabetes Program Recommendations:   ? ?While on prednisone, consider adding Novolog meal coverage 2 units tid with meals (hold if patient eats less than 50% or NPO).  ? ?Thanks,  ?Adah Perl, RN, BC-ADM ?Inpatient Diabetes Coordinator ?Pager [phone]  (8a-5p) ? ? ?

## 2022-02-24 NOTE — Plan of Care (Signed)
?  Problem: Education: ?Goal: Knowledge of disease or condition will improve ?Outcome: Adequate for Discharge ?Goal: Knowledge of the prescribed therapeutic regimen will improve ?Outcome: Adequate for Discharge ?Goal: Individualized Educational Video(s) ?Outcome: Adequate for Discharge ?  ?Problem: Activity: ?Goal: Ability to tolerate increased activity will improve ?Outcome: Adequate for Discharge ?Goal: Will verbalize the importance of balancing activity with adequate rest periods ?Outcome: Adequate for Discharge ?  ?Problem: Respiratory: ?Goal: Ability to maintain a clear airway will improve ?Outcome: Adequate for Discharge ?Goal: Levels of oxygenation will improve ?Outcome: Adequate for Discharge ?Goal: Ability to maintain adequate ventilation will improve ?Outcome: Adequate for Discharge ?  ?Problem: Education: ?Goal: Knowledge of General Education information will improve ?Description: Including pain rating scale, medication(s)/side effects and non-pharmacologic comfort measures ?Outcome: Adequate for Discharge ?  ?Problem: Health Behavior/Discharge Planning: ?Goal: Ability to manage health-related needs will improve ?Outcome: Adequate for Discharge ?  ?Problem: Clinical Measurements: ?Goal: Ability to maintain clinical measurements within normal limits will improve ?Outcome: Adequate for Discharge ?Goal: Will remain free from infection ?Outcome: Adequate for Discharge ?Goal: Diagnostic test results will improve ?Outcome: Adequate for Discharge ?Goal: Respiratory complications will improve ?Outcome: Adequate for Discharge ?Goal: Cardiovascular complication will be avoided ?Outcome: Adequate for Discharge ?  ?Problem: Activity: ?Goal: Risk for activity intolerance will decrease ?Outcome: Adequate for Discharge ?  ?Problem: Coping: ?Goal: Level of anxiety will decrease ?Outcome: Adequate for Discharge ?  ?Problem: Elimination: ?Goal: Will not experience complications related to bowel motility ?Outcome: Adequate  for Discharge ?Goal: Will not experience complications related to urinary retention ?Outcome: Adequate for Discharge ?  ?Problem: Pain Managment: ?Goal: General experience of comfort will improve ?Outcome: Adequate for Discharge ?  ?Problem: Safety: ?Goal: Ability to remain free from injury will improve ?Outcome: Adequate for Discharge ?  ?Problem: Skin Integrity: ?Goal: Risk for impaired skin integrity will decrease ?Outcome: Adequate for Discharge ?  ?

## 2022-02-24 NOTE — Discharge Summary (Signed)
Physician Discharge Summary  ?Lauren Lopez:[id number] DOB: 10/30/1951 DOA: 02/20/2022 ? ?PCP: Unk Pinto, MD ? ?Admit date: 02/20/2022 ?Discharge date: 02/24/2022 ? ?Admitted From: Home ?Disposition:  Home ? ?Recommendations for Outpatient Follow-up:  ?Follow up with PCP in 1-2 weeks ?Please obtain BMP/CBC in one week ? ? ?Home Health: Continue outpatient PT ? ?Discharge Condition:Stable ?CODE STATUS:FULL ?Diet recommendation: Heart Healthy / Carb Modified  ? ?Brief/Interim Summary: ? ?Lauren Lopez is a 71 year old female with H/oh DM 2, HTN, HL, asthma and GERD, coming with acute asthma exacerbation with acute respiratory failure requiring BiPAP, with hypokalemia of 2.5, status post Solu-Medrol, nebulized bronchodilator therapy and magnesium with improvement initially however with atrial tachycardia that later improved also,  however when patient walked to the bathroom she had recurrent worsening dyspnea and wheezing again requiring BiPAP, her respiratory status has improved with no further requirement of BiPAP, she was noted to have SVT as well. ? ?Acute asthma exacerbation ?-Patient with recent R TKR and prior COVID infection with persistent cough since ?-No PE on CTA and she is on Xarelto due to recent knee replacement ?-Apparent bronchitis on CT, treated earlier this week with azithromycin without improvement ?-CXR negative for PNA ?-Continue Advair  ?-She was treated with IV Solu-Medrol during hospital stay, with significant improvement of her symptoms, she has no wheezing over last 24 hours, she will be discharged on short prednisone taper over next 4 days. ?  ?S/P knee replacement ?-recent knee replacement ?-She needs to mobilize when possible but not at the risk of respiratory distress ?-Continue Xarelto ?-Continue outpatient PT ?-Orthopedic has followed her during hospital stay and discontinued her bandage before discharge. ?  ?Arrhythmias/SVT  ?-Patient was monitored on telemetry since  admission as she had prolonged QTc, is having few bursts of tachycardia where her heart rate shoots up to 140s 150s even at rest. ?-Cardiology input greatly appreciated, started on Cardizem scheduled .  Heart rate has improved, currently on Cardizem CD, to follow-up as an outpatient with Dr. Nechama Guard ?-Zio patch to be applied before discharge ? ?  ?Glaucoma ?-Continue latanoprost ?  ?Type 2 diabetes mellitus with stage 2 chronic kidney disease, without long-term current use of insulin ([place]) ?-Recent A1c was 6.5, indicating good control ?-Resume home medications on discharge ?  ?Hyperlipidemia associated with type 2 diabetes mellitus ([place]) ?-Continue Crestor ?  ?Obesity (BMI 30.0-34.9) ?-Weight loss should be encouraged ?-Outpatient PCP/bariatric medicine f/u encouraged ?  ?Essential hypertension ?-She is taking HCTZ at home, currently on hold, she is on Cardizem currently ?  ?Hypokalemia ?- repleted ?  ? ?Discharge Diagnoses:  ?Principal Problem: ?  Acute asthma exacerbation ?Active Problems: ?  Essential hypertension ?  Obesity (BMI 30.0-34.9) ?  Hyperlipidemia associated with type 2 diabetes mellitus ([place]) ?  Type 2 diabetes mellitus with stage 2 chronic kidney disease, without long-term current use of insulin ([place]) ?  Glaucoma ?  Prolonged QT interval ?  S/P knee replacement ?  SVT (supraventricular tachycardia) ([place]) ? ? ? ? ?Discharge Instructions ? ?Discharge Instructions   ? ? Diet - low sodium heart healthy   Complete by: As directed ?  ? Discharge instructions   Complete by: As directed ?  ? Increase activity slowly   Complete by: As directed ?  ? No wound care   Complete by: As directed ?  ? ?  ? ?Allergies as of 02/24/2022   ? ?   Reactions  ? Metronidazole Other (See Comments)  ? unknown  ?  Ppd [tuberculin Purified Protein Derivative] Other (See Comments)  ? + PPD 2013 with NEG CXR 05/2012 ?Redness and severe swelling at injection site  ? ?  ? ?  ?Medication List  ?  ? ?STOP taking these medications    ? ?hydrochlorothiazide 25 MG tablet ?Commonly known as: HYDRODIURIL ?  ? ?  ? ?TAKE these medications   ? ?Advair HFA 230-21 MCG/ACT inhaler ?Generic drug: fluticasone-salmeterol ?Inhale 2 puffs into the lungs 2 (two) times daily. ?  ?blood glucose meter kit and supplies ?Test sugars twice daily. Dx E11.9 ?  ?diltiazem 240 MG 24 hr capsule ?Commonly known as: CARDIZEM CD ?Take 1 capsule (240 mg total) by mouth daily. ?Start taking on: February 25, 2022 ?  ?EPINEPHrine 0.3 mg/0.3 mL Soaj injection ?Commonly known as: EPI-PEN ?SMARTSIG:Injection IM As Directed ?  ?ESTROVEN MENOPAUSE RELIEF PO ?Take 1 capsule by mouth daily. ?  ?fexofenadine 180 MG tablet ?Commonly known as: ALLEGRA ?Take 1 tablet (180 mg total) by mouth daily. ?  ?latanoprost 0.005 % ophthalmic solution ?Commonly known as: XALATAN ?Place 1 drop into both eyes at bedtime. ?  ?metFORMIN 500 MG tablet ?Commonly known as: GLUCOPHAGE ?Take 1 tab twice daily with food for diabetes. Do not increase dose due to causing diarrhea. ?  ?methocarbamol 500 MG tablet ?Commonly known as: ROBAXIN ?Take 1 tablet (500 mg total) by mouth every 6 (six) hours as needed for muscle spasms. ?  ?mometasone 50 MCG/ACT nasal spray ?Commonly known as: NASONEX ?Place 2 sprays into the nose daily as needed (allergies). ?  ?MUCINEX FAST-MAX CLD FLU THRT PO ?Take by mouth. ?  ?omeprazole 40 MG capsule ?Commonly known as: PRILOSEC ?Take one capsule daily for indigestion and heartburn ?  ?OneTouch Delica Plus [id number] Misc ?TEST SUGAR TWICE A DAY ?  ?OneTouch Ultra test strip ?Generic drug: glucose blood ?USE TO TEST SUGAR TWICE A DAY ?  ?oxyCODONE 5 MG immediate release tablet ?Commonly known as: Oxy IR/ROXICODONE ?Take 1-2 tablets (5-10 mg total) by mouth every 6 (six) hours as needed for severe pain. ?  ?predniSONE 20 MG tablet ?Commonly known as: DELTASONE ?Please take 40 mg oral daily x2 days, then 20 mg oral daily x2 days then stop. ?  ?promethazine-dextromethorphan 6.25-15 MG/5ML  syrup ?Commonly known as: PROMETHAZINE-DM ?Take 5 mLs by mouth 4 (four) times daily as needed for cough. ?  ?rivaroxaban 10 MG Tabs tablet ?Commonly known as: XARELTO ?Take one tablet Xarelto once a day for three weeks following surgery. Then change to one baby Aspirin (81 mg) once a day for three weeks. Then discontinue Aspirin. ?  ?rosuvastatin 20 MG tablet ?Commonly known as: Crestor ?Take  1 tablet  Daily  for Cholesterol ?  ?traMADol 50 MG tablet ?Commonly known as: ULTRAM ?Take 1-2 tablets (50-100 mg total) by mouth every 6 (six) hours as needed for moderate pain. ?  ? ?  ? ? Follow-up Information   ? ? Donato Heinz, MD Follow up.   ?Specialties: Cardiology, Radiology ?Why: the office will call with date and time to see your cardiologist ?Contact information: ?[address] ?[address] ?[place] [address] ?[phone] ? ? ?  ?  ? ? Unk Pinto, MD Follow up.   ?Specialty: Internal Medicine ?Contact information: ?[address] ?[address] ?[place] [address] ?[phone] ? ? ?  ?  ? ?  ?  ? ?  ? ?Allergies  ?Allergen Reactions  ? Metronidazole Other (See Comments)  ?  unknown  ? Ppd [  Tuberculin Purified Protein Derivative] Other (See Comments)  ?  + PPD 2013 with NEG CXR 05/2012 ?Redness and severe swelling at injection site  ? ? ?Consultations: ?Cardiology ?Orthopedic ? ? ?Procedures/Studies: ?CT Angio Chest PE W and/or Wo Contrast ? ?Result Date: 02/20/2022 ?CLINICAL DATA:  History of asthma, suspected pulmonary embolism, high probability. EXAM: CT ANGIOGRAPHY CHEST WITH CONTRAST TECHNIQUE: Multidetector CT imaging of the chest was performed using the standard protocol during bolus administration of intravenous contrast. Multiplanar CT image reconstructions and MIPs were obtained to evaluate the vascular anatomy. RADIATION DOSE REDUCTION: This exam was performed according to the departmental dose-optimization program which includes automated exposure control, adjustment of the mA  and/or kV according to patient size and/or use of iterative reconstruction technique. CONTRAST:  154m OMNIPAQUE IOHEXOL 350 MG/ML SOLN COMPARISON:  Portable chest earlier today, CTA chest 04/03/2016 FINDINGS:

## 2022-02-24 NOTE — Discharge Instructions (Signed)
You have a monitor to wear for 2 weeks.  Call Dr. Newman Nickels office if any issues. ? ? ?

## 2022-02-24 NOTE — Progress Notes (Signed)
? ?Progress Note ? ?Patient Name: Lauren Lopez ?Date of Encounter: 02/24/2022 ? ?[facility] Cardiologist: Donato Heinz, MD  ? ?Subjective  ? ?No chest pain or dyspnea ? ?Inpatient Medications  ?  ?Scheduled Meds: ? [START ON 02/25/2022] aspirin  81 mg Oral Daily  ? diltiazem  240 mg Oral Daily  ? docusate sodium  100 mg Oral BID  ? feeding supplement  237 mL Oral BID BM  ? fluticasone  2 spray Each Nare Daily  ? insulin aspart  0-15 Units Subcutaneous TID WC  ? insulin aspart  0-5 Units Subcutaneous QHS  ? latanoprost  1 drop Both Eyes QHS  ? loratadine  10 mg Oral Daily  ? mometasone-formoterol  2 puff Inhalation BID  ? pantoprazole  40 mg Oral Daily  ? predniSONE  40 mg Oral Q breakfast  ? rosuvastatin  20 mg Oral Daily  ? sodium chloride flush  3 mL Intravenous Q12H  ? ?Continuous Infusions: ? ?PRN Meds: ?acetaminophen **OR** acetaminophen, bisacodyl, guaiFENesin, hydrALAZINE, HYDROcodone-acetaminophen, levalbuterol, methocarbamol, morphine injection, ondansetron **OR** ondansetron (ZOFRAN) IV, oxyCODONE, polyethylene glycol, traMADol  ? ?Vital Signs  ?  ?Vitals:  ? 02/24/22 0500 02/24/22 0511 02/24/22 0600 02/24/22 0821  ?BP:  113/71  122/70  ?Pulse:  62 62 64  ?Resp:  '17 14 18  '$ ?Temp:  97.9 ?F (36.6 ?C)  97.6 ?F (36.4 ?C)  ?TempSrc:  Oral  Oral  ?SpO2:  99% 98% 99%  ?Weight: 85.6 kg     ? ? ?Intake/Output Summary (Last 24 hours) at 02/24/2022 1130 ?Last data filed at 02/24/2022 1000 ?Gross per 24 hour  ?Intake 240 ml  ?Output --  ?Net 240 ml  ? ? ? ?  02/24/2022  ?  5:00 AM 02/23/2022  ?  1:48 AM 02/22/2022  ?  2:03 AM  ?Last 3 Weights  ?Weight (lbs) 188 lb 11.4 oz 188 lb 11.4 oz 187 lb 9.8 oz  ?Weight (kg) 85.6 kg 85.6 kg 85.1 kg  ?   ? ?Telemetry  ?  ?NSR, short episodes of likely AT up to 130s- Personally Reviewed ? ?ECG  ?  ?No new ECG - Personally Reviewed ? ?Physical Exam  ? ?GEN: No acute distress.   ?Neck: No JVD ?Cardiac: RRR, no murmurs, rubs, or gallops.  ?Respiratory: Clear to  auscultation bilaterally. ?GI: Soft, nontender, non-distended  ?MS: No edema; No deformity. ?Neuro:  Nonfocal  ?Psych: Normal affect  ? ?Labs  ?  ?High Sensitivity Troponin:   ?Recent Labs  ?Lab 02/20/22 ?0135 02/20/22 ?0359  ?TROPONINIHS 4 3  ? ?   ?Chemistry ?Recent Labs  ?Lab 02/20/22 ?0359 02/21/22 ?0141 02/22/22 ?0132 02/23/22 ?2836 02/24/22 ?6294  ?NA  --    < > 138 138 136  ?K  --    < > 4.5 4.3 4.0  ?CL  --    < > 110 110 108  ?CO2  --    < > 22 22 20*  ?GLUCOSE  --    < > 158* 176* 220*  ?BUN  --    < > '16 18 17  '$ ?CREATININE  --    < > 0.76 0.80 0.74  ?CALCIUM  --    < > 9.2 8.9 8.7*  ?MG 1.8  --  2.2  --   --   ?GFRNONAA  --    < > >60 >60 >60  ?ANIONGAP  --    < > '6 6 8  '$ ? < > = values  in this interval not displayed.  ? ?  ?Lipids No results for input(s): CHOL, TRIG, HDL, LABVLDL, LDLCALC, CHOLHDL in the last 168 hours.  ?Hematology ?Recent Labs  ?Lab 02/22/22 ?0132 02/23/22 ?9924 02/24/22 ?2683  ?WBC 15.1* 11.6* 10.9*  ?RBC 3.62* 3.65* 3.78*  ?HGB 9.8* 9.7* 10.2*  ?HCT 31.6* 31.8* 32.8*  ?MCV 87.3 87.1 86.8  ?MCH 27.1 26.6 27.0  ?MCHC 31.0 30.5 31.1  ?RDW 16.8* 16.6* 16.4*  ?PLT 496* 462* 438*  ? ? ?Thyroid  ?Recent Labs  ?Lab 02/23/22 ?4196  ?TSH 0.277*  ? ?  ?BNP ?Recent Labs  ?Lab 02/20/22 ?0135  ?BNP 8.6  ? ?  ?DDimer No results for input(s): DDIMER in the last 168 hours.  ? ?Radiology  ?  ?ECHOCARDIOGRAM COMPLETE ? ?Result Date: 02/23/2022 ?   ECHOCARDIOGRAM REPORT   Patient Name:   Lauren Lopez Date of Exam: 02/23/2022 Medical Rec #:  [id number]         Height:       63.0 in Accession #:    [id number]        Weight:       188.7 lb Date of Birth:  11-17-1951        BSA:          1.886 m? Patient Age:    71 years          BP:           119/75 mmHg Patient Gender: F                 HR:           72 bpm. Exam Location:  Inpatient Procedure: 2D Echo, Cardiac Doppler and Color Doppler Indications:    Dyspnea  History:        Patient has prior history of Echocardiogram examinations, most                  recent 04/04/2016. Risk Factors:Diabetes, Hypertension and                 Dyslipidemia.  Sonographer:    Bernadene Person RDCS Referring Phys: [place]  1. Left ventricular ejection fraction, by estimation, is 55 to 60%. The left ventricle has normal function. The left ventricle has no regional wall motion abnormalities. Left ventricular diastolic parameters were normal.  2. Right ventricular systolic function is normal. The right ventricular size is normal.  3. The mitral valve is normal in structure. Trivial mitral valve regurgitation. No evidence of mitral stenosis.  4. The aortic valve is tricuspid. Aortic valve regurgitation is not visualized. No aortic stenosis is present.  5. The inferior vena cava is normal in size with greater than 50% respiratory variability, suggesting right atrial pressure of 3 mmHg. FINDINGS  Left Ventricle: Left ventricular ejection fraction, by estimation, is 55 to 60%. The left ventricle has normal function. The left ventricle has no regional wall motion abnormalities. The left ventricular internal cavity size was normal in size. There is  no left ventricular hypertrophy. Left ventricular diastolic parameters were normal. Right Ventricle: The right ventricular size is normal. Right ventricular systolic function is normal. Left Atrium: Left atrial size was normal in size. Right Atrium: Right atrial size was normal in size. Pericardium: There is no evidence of pericardial effusion. Mitral Valve: The mitral valve is normal in structure. Trivial mitral valve regurgitation. No evidence of mitral valve stenosis. Tricuspid Valve: The tricuspid valve is normal in structure. Tricuspid  valve regurgitation is trivial. No evidence of tricuspid stenosis. Aortic Valve: The aortic valve is tricuspid. Aortic valve regurgitation is not visualized. No aortic stenosis is present. Pulmonic Valve: The pulmonic valve was normal in structure. Pulmonic valve regurgitation is not  visualized. No evidence of pulmonic stenosis. Aorta: The aortic root is normal in size and structure. Venous: The inferior vena cava is normal in size with greater than 50% respiratory variability, suggesting right atrial pressure of 3 mmHg. IAS/Shunts: No atrial level shunt detected by color flow Doppler.  LEFT VENTRICLE PLAX 2D LVIDd:         4.50 cm   Diastology LVIDs:         3.10 cm   LV e' medial:    13.80 cm/s LV PW:         1.00 cm   LV E/e' medial:  6.1 LV IVS:        0.90 cm   LV e' lateral:   20.00 cm/s LVOT diam:     2.10 cm   LV E/e' lateral: 4.2 LV SV:         58 LV SV Index:   31 LVOT Area:     3.46 cm?  RIGHT VENTRICLE RV S prime:     14.50 cm/s TAPSE (M-mode): 1.1 cm LEFT ATRIUM             Index        RIGHT ATRIUM          Index LA diam:        3.10 cm 1.64 cm/m?   RA Area:     9.62 cm? LA Vol (A2C):   28.2 ml 14.95 ml/m?  RA Volume:   18.90 ml 10.02 ml/m? LA Vol (A4C):   32.9 ml 17.44 ml/m? LA Biplane Vol: 32.0 ml 16.96 ml/m?  AORTIC VALVE LVOT Vmax:   101.00 cm/s LVOT Vmean:  69.400 cm/s LVOT VTI:    0.168 m  AORTA Ao Root diam: 2.70 cm Ao Asc diam:  2.90 cm MITRAL VALVE MV Area (PHT): 4.86 cm?    SHUNTS MV Decel Time: 156 msec    Systemic VTI:  0.17 m MV E velocity: 84.10 cm/s  Systemic Diam: 2.10 cm MV A velocity: 61.30 cm/s MV E/A ratio:  1.37 Kirk Ruths MD Electronically signed by Kirk Ruths MD Signature Date/Time: 02/23/2022/2:07:51 PM    Final    ? ?Cardiac Studies  ? ?02/23/22: ? 1. Left ventricular ejection fraction, by estimation, is 55 to 60%. The  ?left ventricle has normal function. The left ventricle has no regional  ?wall motion abnormalities. Left ventricular diastolic parameters were  ?normal.  ? 2. Right ventricular systolic function is normal. The right ventricular  ?size is normal.  ? 3. The mitral valve is normal in structure. Trivial mitral valve  ?regurgitation. No evidence of mitral stenosis.  ? 4. The aortic valve is tricuspid. Aortic valve regurgitation is not   ?visualized. No aortic stenosis is present.  ? 5. The inferior vena cava is normal in size with greater than 50%  ?respiratory variability, suggesting right atrial pressure of 3 mmHg.  ? ?Patient Profile  ?   ?67 y.

## 2022-02-24 NOTE — Progress Notes (Signed)
Explained discharge summary to patient and daughter. Reviewed discharged medications and next administration times. Also reviewed follow up appointments. Zio patch information was reviewed and educational information was given to patient. IV was removed by staff NT. All belongings. are in patient's possess. Transporting down for discharge.  ?

## 2022-02-24 NOTE — Progress Notes (Signed)
? ?  Subjective: ?  Patient status post right total knee arthroplasty from 02/02/2022. Was notified about patient in hospital due to surgical dressing (Aquacel) that was still on the incision. Patient originally scheduled for postop appointment last week, but missed due to admission.  ? ?Objective: ?Vital signs in last 24 hours: ?Temp:  [97.6 ?F (36.4 ?C)-98.3 ?F (36.8 ?C)] 97.6 ?F (36.4 ?C) (03/28 0175) ?Pulse Rate:  [60-123] 64 (03/28 0821) ?Resp:  [14-19] 18 (03/28 1025) ?BP: (106-122)/(69-74) 122/70 (03/28 8527) ?SpO2:  [96 %-100 %] 99 % (03/28 0821) ?Weight:  [85.6 kg] 85.6 kg (03/28 0500) ? ?Intake/Output from previous day: ? ?Intake/Output Summary (Last 24 hours) at 02/24/2022 1240 ?Last data filed at 02/24/2022 1000 ?Gross per 24 hour  ?Intake 240 ml  ?Output --  ?Net 240 ml  ?  ?Intake/Output this shift: ?Total I/O ?In: 240 [P.O.:240] ?Out: -  ? ?Labs: ?Recent Labs  ?  02/22/22 ?0132 02/23/22 ?7824 02/24/22 ?2353  ?HGB 9.8* 9.7* 10.2*  ? ?Recent Labs  ?  02/23/22 ?6144 02/24/22 ?3154  ?WBC 11.6* 10.9*  ?RBC 3.65* 3.78*  ?HCT 31.8* 32.8*  ?PLT 462* 438*  ? ?Recent Labs  ?  02/23/22 ?0086 02/24/22 ?7619  ?NA 138 136  ?K 4.3 4.0  ?CL 110 108  ?CO2 22 20*  ?BUN 18 17  ?CREATININE 0.80 0.74  ?GLUCOSE 176* 220*  ?CALCIUM 8.9 8.7*  ? ?No results for input(s): LABPT, INR in the last 72 hours. ? ?Exam: ?General - Patient is Alert and Oriented ?Extremity - Neurologically intact ?Neurovascular intact ?Sensation intact distally ?Dorsiflexion/Plantar flexion intact ?Dressing/Incision - clean, dry, no drainage. Aquacel removed, incision looks great. ?Motor Function - intact, moving foot and toes well on exam.  ? ?Past Medical History:  ?Diagnosis Date  ? Arthritis   ? "right hand, back" (04/03/2016)  ? Asthma   ? takes weekly allergy shots, none in 5-6 weeks due to knee surgery  ? Bilateral carpal tunnel syndrome   ? Chronic lower back pain   ? GERD (gastroesophageal reflux disease)   ? Hyperlipidemia   ? Hypertension   ?  Multiple allergies   ? Pneumonia 03/2012  ? Positive PPD   ? "they told me it wasn't positive; I was allergic to the test itself"  ? Right rotator cuff tear 01/20/2013  ? Type II diabetes mellitus ([place])   ? 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 Vitamin deficiency   ? Wears dentures   ? upper  ? Wears glasses   ? ? ?Assessment/Plan: ?   ?Principal Problem: ?  Acute asthma exacerbation ?Active Problems: ?  Essential hypertension ?  Obesity (BMI 30.0-34.9) ?  Hyperlipidemia associated with type 2 diabetes mellitus ([place]) ?  Type 2 diabetes mellitus with stage 2 chronic kidney disease, without long-term current use of insulin ([place]) ?  Glaucoma ?  Prolonged QT interval ?  S/P knee replacement ?  SVT (supraventricular tachycardia) ([place]) ? ?Estimated body mass index is 33.43 kg/m? as calculated from the following: ?  Height as of 02/02/22: '5\' 3"'$  (1.6 m). ?  Weight as of this encounter: 85.6 kg. ? ? ?Aquacel removed, incision looks great. Should resume outpatient physical therapy upon discharge. ? ?Will schedule an appointment for her later this week in clinic.  ? ?Theresa Duty, PA-C ?Orthopedic Surgery ?[phone] ?02/24/2022, 12:40 PM ? ?

## 2022-02-24 NOTE — Care Management Important Message (Signed)
Important Message ? ?Patient Details  ?Name: Lauren Lopez ?MRN: [id number] ?Date of Birth: [birth year] ? ? ?Medicare Important Message Given:  Yes ? ? ? ? ?Luverne Zerkle ?02/24/2022, 1:53 PM ?

## 2022-02-25 ENCOUNTER — Telehealth: Payer: Self-pay | Admitting: Cardiology

## 2022-02-25 ENCOUNTER — Encounter: Payer: Self-pay | Admitting: Cardiology

## 2022-02-25 DIAGNOSIS — I480 Paroxysmal atrial fibrillation: Secondary | ICD-10-CM | POA: Insufficient documentation

## 2022-02-25 DIAGNOSIS — I471 Supraventricular tachycardia: Secondary | ICD-10-CM | POA: Diagnosis not present

## 2022-02-25 NOTE — Telephone Encounter (Signed)
After talking with Dr. [place] the DOD.Marland Kitchen pt to see Dr. Percival Spanish 02/26/22... she is off the Xarelto that she was on for a short time after her knee replacement.  ? ?She is still taking ASA 81 mg and her Diltiazem.  ?Strips placed in Dr. Rosezella [place] mailbox.  ?

## 2022-02-25 NOTE — Telephone Encounter (Signed)
Received patient call from [facility] stating that patient was in Aflutter rate of 156 today at 9:03 AM.  ?I contacted patient, she states she is feeling fine, no concerns, no palpitations.  ? ?I attempted to review and give monitor, but was unable to pull it up on the website.  ? ?Will wait for copy to be sent to [name].  ? ?Thanks! ?

## 2022-02-25 NOTE — Telephone Encounter (Signed)
[facility] is calling to report pt's critical EKG ?

## 2022-02-25 NOTE — Telephone Encounter (Signed)
Calling with an abnormal EKG 

## 2022-02-25 NOTE — Telephone Encounter (Signed)
I got a call form [employer] monitoring and they are reporting that the pt had Rapid Afib rate 176-189 average HR 185... at 11:30 am. I spoke with the pt and she reports that's he has been feeling well... no dizziness, palpitations, SOB, no presyncope.  ? ?I will obtain the strips and talk with the DOD..Dr. [place].  ?

## 2022-02-25 NOTE — Progress Notes (Signed)
?  ?Cardiology Office Note ? ? ?Date:  02/26/2022  ? ?ID:  Lauren Lopez, DOB [birth year], MRN [id number] ? ?PCP:  Unk Pinto, MD  ?Cardiologist:   Donato Heinz, MD ? ? ?Chief Complaint  ?Patient presents with  ? Tachycardia  ? ? ?  ?History of Present Illness: ?Lauren Lopez is a 71 y.o. female who presents for evaluation of tachycardia. The patient was added to my scheduled for follow up of what the monitoring company thought was atrial flutter.Lauren Lopez  She was discharged in the hospital a couple of days ago with acute asthma exacerbation. She was seen by [name] because of SVT.  She was treated with Cardizem.  Zio patch was applied.   We were called yesterday because she was noted to have rapid atrial fib.   I was able to review the strips.  She did have narrow complex tachycardia with rates in the 150s to 160s.  Yesterday he had this and it was thought to be atrial flutter.  She comes in today and she looks to have an ectopic atrial tachycardia that is not sustained.  She does have a wearable on her wrist which shows her heart rate at the moment is in the 80s but she has not looked at the trend.  She does not feel any tachypalpitations.  She does not have any presyncope or syncope.  She has no chest pressure, neck or arm discomfort.  She is otherwise not had any heart problems.  She is recovering from knee surgery.  She has had the asthma exacerbation she had has improved.  She was treated with steroids.  Her echocardiogram done in the hospital demonstrated a well-preserved ejection fraction.  There were no significant valvular abnormalities. ? ?Past Medical History:  ?Diagnosis Date  ? Arthritis   ? "right hand, back" (04/03/2016)  ? Asthma   ? takes weekly allergy shots, none in 5-6 weeks due to knee surgery  ? Bilateral carpal tunnel syndrome   ? Chronic lower back pain   ? GERD (gastroesophageal reflux disease)   ? Hyperlipidemia   ? Hypertension   ? Multiple allergies   ? Pneumonia 03/2012  ?  Positive PPD   ? "they told me it wasn't positive; I was allergic to the test itself"  ? Right rotator cuff tear 01/20/2013  ? Type II diabetes mellitus ([place])   ? 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 Vitamin deficiency   ? Wears dentures   ? upper  ? Wears glasses   ? ? ?Past Surgical History:  ?Procedure Laterality Date  ? ABDOMINAL HYSTERECTOMY    ? BILATERAL SALPINGOOPHORECTOMY  11/28/2010  ? open laparoscopy with adhesiolysis also  ? CARPAL TUNNEL RELEASE  07/15/2012  ? Procedure: CARPAL TUNNEL RELEASE;  Surgeon: Cammie Sickle., MD;  Location: [facility];  Service: Orthopedics;  Laterality: Left;  ? CARPAL TUNNEL RELEASE Right 05/31/2014  ? Procedure: RIGHT CARPAL TUNNEL RELEASE;  Surgeon: Cammie Sickle, MD;  Location: [facility];  Service: Orthopedics;  Laterality: Right;  ? COLONOSCOPY    ? KNEE ARTHROSCOPY Bilateral 2005-2016  ? "right-left"  ? SHOULDER ARTHROSCOPY WITH ROTATOR CUFF REPAIR AND SUBACROMIAL DECOMPRESSION Right 01/20/2013  ? Procedure: RIGHT ARTHROSCOPY SHOULDER DEBRIDEMENT LIMITED, ARTHROSCOPY SHOULDER DECOMPRESSION SUBACROMIAL PARTIAL ACROMIOPLASTY WITH CORACOACROMIAL RELEASE, ROTATOR CUFF REPAIR ;  Surgeon: Johnny Bridge, MD;  Location: [facility];  Service: Orthopedics;  Laterality: Right;  RIGHT SHOULDER SCOPE DEBRIDEMENT, ACRIOMIOPLASTY, ROTATOR CUFF REPAIR  ?  TOTAL KNEE ARTHROPLASTY Right 02/02/2022  ? Procedure: TOTAL KNEE ARTHROPLASTY;  Surgeon: Gaynelle Arabian, MD;  Location: [facility];  Service: Orthopedics;  Laterality: Right;  ? TUBAL LIGATION    ? ? ? ?Current Outpatient Medications  ?Medication Sig Dispense Refill  ? ADVAIR HFA 230-21 MCG/ACT inhaler Inhale 2 puffs into the lungs 2 (two) times daily.    ? aspirin EC 81 MG tablet Take 81 mg by mouth daily. Swallow whole.    ? blood glucose meter kit and supplies Test sugars twice daily. Dx E11.9 1 each 0  ? diltiazem (CARDIZEM CD) 240 MG 24 hr capsule Take 1 capsule (240 mg total) by mouth  daily. 30 capsule 0  ? fexofenadine (ALLEGRA) 180 MG tablet Take 1 tablet (180 mg total) by mouth daily. 90 tablet 6  ? glucose blood (ONETOUCH ULTRA) test strip USE TO TEST SUGAR TWICE A DAY 100 strip 3  ? Lancets (ONETOUCH DELICA PLUS [id number]) MISC TEST SUGAR TWICE A DAY 200 each 1  ? latanoprost (XALATAN) 0.005 % ophthalmic solution Place 1 drop into both eyes at bedtime.    ? metFORMIN (GLUCOPHAGE) 500 MG tablet Take 1 tab twice daily with food for diabetes. Do not increase dose due to causing diarrhea. 180 tablet 3  ? metoprolol tartrate (LOPRESSOR) 25 MG tablet Take 1 tablet (25 mg total) by mouth 2 (two) times daily. 180 tablet 3  ? omeprazole (PRILOSEC) 40 MG capsule Take one capsule daily for indigestion and heartburn 90 capsule 3  ? predniSONE (DELTASONE) 20 MG tablet Please take 40 mg oral daily x2 days, then 20 mg oral daily x2 days then stop. 6 tablet 0  ? Rhubarb (ESTROVEN MENOPAUSE RELIEF PO) Take 1 capsule by mouth daily.    ? rosuvastatin (CRESTOR) 20 MG tablet Take  1 tablet  Daily  for Cholesterol 90 tablet 3  ? EPINEPHrine 0.3 mg/0.3 mL IJ SOAJ injection SMARTSIG:Injection IM As Directed (Patient not taking: Reported on 02/26/2022)    ? methocarbamol (ROBAXIN) 500 MG tablet Take 1 tablet (500 mg total) by mouth every 6 (six) hours as needed for muscle spasms. (Patient not taking: Reported on 02/26/2022) 40 tablet 0  ? mometasone (NASONEX) 50 MCG/ACT nasal spray Place 2 sprays into the nose daily as needed (allergies). (Patient not taking: Reported on 02/26/2022)    ? oxyCODONE (OXY IR/ROXICODONE) 5 MG immediate release tablet Take 1-2 tablets (5-10 mg total) by mouth every 6 (six) hours as needed for severe pain. (Patient not taking: Reported on 02/26/2022) 42 tablet 0  ? Phenylephrine-DM-GG-APAP (MUCINEX FAST-MAX CLD FLU THRT PO) Take by mouth. (Patient not taking: Reported on 02/26/2022)    ? promethazine-dextromethorphan (PROMETHAZINE-DM) 6.25-15 MG/5ML syrup Take 5 mLs by mouth 4 (four) times  daily as needed for cough. (Patient not taking: Reported on 02/26/2022) 240 mL 1  ? traMADol (ULTRAM) 50 MG tablet Take 1-2 tablets (50-100 mg total) by mouth every 6 (six) hours as needed for moderate pain. (Patient not taking: Reported on 02/26/2022) 40 tablet 0  ? ?No current facility-administered medications for this visit.  ? ? ?Allergies:   Metronidazole and Ppd [tuberculin purified protein derivative]  ? ? ?ROS:  Please see the history of present illness.   Otherwise, review of systems are positive for none.   All other systems are reviewed and negative.  ? ? ?PHYSICAL EXAM: ?VS:  BP 126/74   Pulse (!) 108   Ht '5\' 3"'  (1.6 m)   Wt 184 lb 6.4 oz (  83.6 kg)   SpO2 99%   BMI 32.66 kg/m?  , BMI Body mass index is 32.66 kg/m?. ?GENERAL:  Well appearing ?HEENT:  Pupils equal round and reactive, fundi not visualized, oral mucosa unremarkable ?NECK:  No jugular venous distention, waveform within normal limits, carotid upstroke brisk and symmetric, no bruits, no thyromegaly ?LYMPHATICS:  No cervical, inguinal adenopathy ?LUNGS:  Clear to auscultation bilaterally ?BACK:  No CVA tenderness ?CHEST:  Unremarkable ?HEART:  PMI not displaced or sustained,S1 and S2 within normal limits, no S3, no S4, no clicks, no rubs, no murmurs ?ABD:  Flat, positive bowel sounds normal in frequency in pitch, no bruits, no rebound, no guarding, no midline pulsatile mass, no hepatomegaly, no splenomegaly ?EXT:  2 plus pulses throughout, no cyanosis no clubbing, status post recent right knee surgery with some right leg swelling ?SKIN:  No rashes no nodules ?NEURO:  Cranial nerves II through XII grossly intact, motor grossly intact throughout ?PSYCH:  Cognitively intact, oriented to person place and time ? ? ? ?EKG:  EKG is ordered today. ?The ekg ordered today demonstrates ectopic atrial tachycardia with intermittent ectopic beats ? ? ?Recent Labs: ?12/09/2021: ALT 14 ?02/20/2022: B Natriuretic Peptide 8.6 ?02/22/2022: Magnesium  2.2 ?02/23/2022: TSH 0.277 ?02/24/2022: BUN 17; Creatinine, Ser 0.74; Hemoglobin 10.2; Platelets 438; Potassium 4.0; Sodium 136  ? ? ?Lipid Panel ?   ?Component Value Date/Time  ? CHOL 188 07/07/2021 1143  ? TRIG 63 07/07/2021 1

## 2022-02-26 ENCOUNTER — Ambulatory Visit (INDEPENDENT_AMBULATORY_CARE_PROVIDER_SITE_OTHER): Payer: PRIVATE HEALTH INSURANCE | Admitting: Cardiology

## 2022-02-26 ENCOUNTER — Encounter: Payer: Self-pay | Admitting: Cardiology

## 2022-02-26 VITALS — BP 126/74 | HR 108 | Ht 63.0 in | Wt 184.4 lb

## 2022-02-26 DIAGNOSIS — I10 Essential (primary) hypertension: Secondary | ICD-10-CM | POA: Diagnosis not present

## 2022-02-26 DIAGNOSIS — I480 Paroxysmal atrial fibrillation: Secondary | ICD-10-CM | POA: Diagnosis not present

## 2022-02-26 MED ORDER — METOPROLOL TARTRATE 25 MG PO TABS: 25.0000 mg | ORAL_TABLET | Freq: Two times a day (BID) | ORAL | 3 refills | Status: DC

## 2022-02-26 NOTE — Patient Instructions (Signed)
Medication Instructions:  ?START Metoprolol Tartrate 25 mg twice daily ? ?*If you need a refill on your cardiac medications before your next appointment, please call your pharmacy* ? ? ?Lab Work: ?None ordered ?If you have labs (blood work) drawn today and your tests are completely normal, you will receive your results only by: ?MyChart Message (if you have MyChart) OR ?A paper copy in the mail ?If you have any lab test that is abnormal or we need to change your treatment, we will call you to review the results. ? ? ?Testing/Procedures: ?None ordered ? ? ?Follow-Up: ?At [facility], you and your health needs are our priority.  As part of our continuing mission to provide you with exceptional heart care, we have created designated Provider Care Teams.  These Care Teams include your primary Cardiologist (physician) and Advanced Practice Providers (APPs -  Physician Assistants and Nurse Practitioners) who all work together to provide you with the care you need, when you need it. ? ?We recommend signing up for the patient portal called "MyChart".  Sign up information is provided on this After Visit Summary.  MyChart is used to connect with patients for Virtual Visits (Telemedicine).  Patients are able to view lab/test results, encounter notes, upcoming appointments, etc.  Non-urgent messages can be sent to your provider as well.   ?To learn more about what you can do with MyChart, go to [email or web address].   ? ?Your next appointment:   ?2 week(s) ? ?The format for your next appointment:   ?In Person ? ?Provider:   ?Donato Heinz, MD { ? ? ?

## 2022-02-27 ENCOUNTER — Telehealth: Payer: Self-pay | Admitting: Cardiology

## 2022-02-27 DIAGNOSIS — M25661 Stiffness of right knee, not elsewhere classified: Secondary | ICD-10-CM | POA: Diagnosis not present

## 2022-02-27 DIAGNOSIS — M25561 Pain in right knee: Secondary | ICD-10-CM | POA: Diagnosis not present

## 2022-02-27 NOTE — Telephone Encounter (Signed)
[facility] calling with results Ref# [id number] ? HR 180 bmp over 60 secs ?

## 2022-02-27 NOTE — Telephone Encounter (Signed)
Received call from [facility](ZIO) spoke with Elizabeth([phone]) she states that pt has sustained AFIB HR180 over 60sec. They do not call pt. ? ?Called pt she states that she was getting ready to go to [facility] and did not feel anything. She states that "this is the problem, I do not feel AFIB" she will continue to monitor and 'push the button'. ? ?3-31 @ 1:28pm LMVM on cell phone and home number with appointment date/time. 03-06-22 @ 930am. ? ?Mychart message sent. ?

## 2022-03-02 DIAGNOSIS — M25661 Stiffness of right knee, not elsewhere classified: Secondary | ICD-10-CM | POA: Diagnosis not present

## 2022-03-02 DIAGNOSIS — M25561 Pain in right knee: Secondary | ICD-10-CM | POA: Diagnosis not present

## 2022-03-03 ENCOUNTER — Telehealth: Payer: Self-pay

## 2022-03-03 DIAGNOSIS — K760 Fatty (change of) liver, not elsewhere classified: Secondary | ICD-10-CM | POA: Insufficient documentation

## 2022-03-03 DIAGNOSIS — I700 Atherosclerosis of aorta: Secondary | ICD-10-CM | POA: Insufficient documentation

## 2022-03-03 NOTE — Telephone Encounter (Signed)
Called patient on 03/03/2022 , 1:32 PM in an attempt to reach the patient for a hospital follow up.  ? ?Admit date: 02/20/22 ?Discharge: 02/24/22  ? ?She does not have any questions or concerns about medications from the hospital admission. The patient's medications were reviewed over the phone, they were counseled to bring in all current medications to the hospital follow up visit.  ? ?I advised the patient to call if any questions or concerns arise about the hospital admission or medications   ? ?Home health was not started in the hospital.  ?All questions were answered and a follow up appointment was made.  ? ?Prior to Admission medications   ?Medication Sig Start Date End Date Taking? Authorizing Provider  ?ADVAIR HFA 230-21 MCG/ACT inhaler Inhale 2 puffs into the lungs 2 (two) times daily. 07/28/21   [provider]  ?aspirin EC 81 MG tablet Take 81 mg by mouth daily. Swallow whole.    [provider]  ?blood glucose meter kit and supplies Test sugars twice daily. Dx E11.9 01/24/19   Vladimir Crofts, PA-C  ?diltiazem (CARDIZEM CD) 240 MG 24 hr capsule Take 1 capsule (240 mg total) by mouth daily. 02/25/22   Elgergawy, Silver Huguenin, MD  ?EPINEPHrine 0.3 mg/0.3 mL IJ SOAJ injection SMARTSIG:Injection IM As Directed ?Patient not taking: Reported on 02/26/2022 12/02/21   [provider]  ?fexofenadine (ALLEGRA) 180 MG tablet Take 1 tablet (180 mg total) by mouth daily. 10/17/15   Rolene Course, PA-C  ?glucose blood (ONETOUCH ULTRA) test strip USE TO TEST SUGAR TWICE A DAY 12/31/21   Magda Bernheim, NP  ?Lancets (ONETOUCH DELICA PLUS [id number]) [address] TEST SUGAR TWICE A DAY 07/25/19   Unk Pinto, MD  ?latanoprost (XALATAN) 0.005 % ophthalmic solution Place 1 drop into both eyes at bedtime. 11/26/21   [provider]  ?metFORMIN (GLUCOPHAGE) 500 MG tablet Take 1 tab twice daily with food for diabetes. Do not increase dose due to causing diarrhea. 07/07/21   Liane Comber, NP  ?methocarbamol  (ROBAXIN) 500 MG tablet Take 1 tablet (500 mg total) by mouth every 6 (six) hours as needed for muscle spasms. ?Patient not taking: Reported on 02/26/2022 02/03/22   Fenton Foy D, PA-C  ?metoprolol tartrate (LOPRESSOR) 25 MG tablet Take 1 tablet (25 mg total) by mouth 2 (two) times daily. 02/26/22 05/27/22  Minus Breeding, MD  ?mometasone (NASONEX) 50 MCG/ACT nasal spray Place 2 sprays into the nose daily as needed (allergies). ?Patient not taking: Reported on 02/26/2022    [provider]  ?omeprazole (PRILOSEC) 40 MG capsule Take one capsule daily for indigestion and heartburn 07/31/21   Liane Comber, NP  ?oxyCODONE (OXY IR/ROXICODONE) 5 MG immediate release tablet Take 1-2 tablets (5-10 mg total) by mouth every 6 (six) hours as needed for severe pain. ?Patient not taking: Reported on 02/26/2022 02/03/22   Jonnie Kind, PA-C  ?Phenylephrine-DM-GG-APAP (MUCINEX FAST-MAX CLD FLU THRT PO) Take by mouth. ?Patient not taking: Reported on 02/26/2022    [provider]  ?predniSONE (DELTASONE) 20 MG tablet Please take 40 mg oral daily x2 days, then 20 mg oral daily x2 days then stop. 02/24/22   Elgergawy, Silver Huguenin, MD  ?promethazine-dextromethorphan (PROMETHAZINE-DM) 6.25-15 MG/5ML syrup Take 5 mLs by mouth 4 (four) times daily as needed for cough. ?Patient not taking: Reported on 02/26/2022 12/29/21   Magda Bernheim, NP  ?Rhubarb (ESTROVEN MENOPAUSE RELIEF PO) Take 1 capsule by mouth daily.    [provider]  ?  rosuvastatin (CRESTOR) 20 MG tablet Take  1 tablet  Daily  for Cholesterol 07/08/21   Liane Comber, NP  ?traMADol (ULTRAM) 50 MG tablet Take 1-2 tablets (50-100 mg total) by mouth every 6 (six) hours as needed for moderate pain. ?Patient not taking: Reported on 02/26/2022 02/03/22   Jonnie Kind, PA-C  ? ? ? ? ? ?  ?

## 2022-03-03 NOTE — Progress Notes (Signed)
Hospital follow up ? ?Assessment and Plan: ?Hospital visit follow up for:  ? ?Diagnoses and all orders for this visit: ? ?SVT (supraventricular tachycardia) ([place]) ?Controlled on cardizem/metoprolol; cardiology following ? ?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 02/20/2022 ?Control blood pressure, cholesterol, glucose, increase exercise.  ? ?Hepatic steatosis (mild per CTA 02/20/2022) ?Weight loss with low processed carbohydrate diet recommended ?Limit tylenol/alcohol ?Monitor LFT trend and follow up US if trending up ? ?Anemia, unspecified type ?Post op, persistent fatigue, recheck levels with iron to determine need for supplement ?-     CBC with Differential/Platelet ?-     Iron, TIBC and Ferritin Panel ? ?Medication management ?-     CBC with Differential/Platelet ?-     BASIC METABOLIC PANEL WITH GFR ? ?Mild intermittent asthma without complication ?Followed by Vel[facility] Heckler allergy/asthma ?She is reporting they advised to stop advair and taking albuterol BID scheduled; discussed this is atypical; will reach out to request notes to clarify.  ? ?Type 2 diabetes mellitus with stage 2 chronic kidney disease, without long-term current use of insulin ([place]) ?Advised to stop apple/orange juice, can eat whole fruit instead ?Continue metformint ?Check fasting, contact office if fasting persistent 130+ ?Follow up for A1C in 1 month ? ?S/p R TKA ?Ortho/PT following, healing well  ?Dicussed can use tylenol [phone] mg TID PRN ?Follow up ortho PRN concerns ? ? ?All medications were reviewed with patient and family and fully reconciled. All questions answered fully, and patient and family members were encouraged to call the office with any further questions or concerns. Discussed goal to avoid readmission related to this diagnosis.  ?Medications Discontinued During This Encounter  ?Medication Reason  ? predniSONE (DELTASONE) 20 MG tablet Completed Course  ? ? ?Over 40 minutes of exam, counseling, chart review, and complex,  high/moderate level critical decision making was performed this visit.  ? ?Future Appointments  ?Date Time Provider Bibb  ?03/06/2022  9:30 AM Donato Heinz, MD [facility]  ?03/30/2022  9:30 AM Donato Heinz, MD [facility]  ?04/21/2022  3:00 PM Liane Comber, NP [facility] None  ?07/07/2022 10:30 AM Darrol Jump, NP [facility] None  ? ? ? ?HPI ?71 y.o.female presents for follow up for transition from recent hospitalization or SNIF stay. Admit date to the hospital was 02/20/22, patient was discharged from the hospital on 02/24/22 and our clinical staff contacted the office the day after discharge to set up a follow up appointment. The discharge summary, medications, and diagnostic test results were reviewed before meeting with the patient. The patient was admitted for:  ? ?Principal Problem: ?  Acute asthma exacerbation ?Active Problems: ?  Prolonged QT interval ?  S/P knee replacement ?  SVT (supraventricular tachycardia) ([place]) ? ?Recommendations for Outpatient Follow-up:  ?Follow up with PCP in 1-2 weeks ?Please obtain BMP/CBC in one week ?  ?Home Health: Continue outpatient PT ?  ?Brief/Interim Summary per discharge note: ?  ?Lauren Lopez is a 71 year old female with H/oh DM 2, HTN, HL, asthma and GERD, presented to ED with acute asthma exacerbation with acute respiratory failure requiring BiPAP, with hypokalemia of 2.5, status post Solu-Medrol, nebulized bronchodilator therapy and magnesium with improvement initially however with atrial tachycardia that later improved also,  however when patient walked to the bathroom she had recurrent worsening dyspnea and wheezing again requiring BiPAP, her respiratory status has improved with no further requirement of BiPAP, she was noted to have SVT as well. ?  ?Acute asthma exacerbation ?-Patient with  recent R TKR and prior COVID infection with persistent cough since ?-No PE on CTA and she is on Xarelto due to recent knee  replacement ?-Apparent bronchitis on CT, treated earlier that week with azithromycin without improvement ?-CXR negative for PNA ?-Continue Advair  ?-She was treated with IV Solu-Medrol during hospital stay, with significant improvement of her symptoms, she has no wheezing over last 24 hours, she will be discharged on short prednisone taper over next 4 days. ?  ?S/P knee replacement ?-recent knee replacement ?-completed xarelto x 3 weeks, then back to bASA ?-Continue outpatient PT ?-Orthopedic followed her during hospital stay and discontinued her bandage before discharge. ?  ?Arrhythmias/SVT  ?-Patient was monitored on telemetry since admission as she had prolonged QTc, was noted to have a few bursts of tachycardia where her heart rate shoots up to 140s 150s even at rest. Cardiology evaluated and started on cardizem with improvement, discharged on zio patch, had outpatient follow up with Dr. Gardiner Rhyme  ? ?Hypertension ?- HCTZ held while in hospital, discharged on cardizem due to SVT ? ?Hypokalemia ?- repleted ?  ?Hospital follow up 03/04/2022: ?  ? BP 108/62   Pulse (!) 106   Temp (!) 96.6 ?F (35.9 ?C)   Wt 188 lb (85.3 kg)   SpO2 99%   BMI 33.30 kg/m?  ? ?71 y.o. female presents for 8 day hospital follow up for asthma exacerbation complicated by SVT.  ? ?She had covid 19 end of January 2023, tx with paxlovid and steroid taper which mostly resolved but reports had persistent dry cough. Underwent R TKA by Dr. Wynelle Link 02/02/2022, did well but about a week later had progressive coughing and wheezing and was transported to ED by EMS on 02/20/2022. She had unremarkable CXR, CTA 02/20/2022 showed possible bronchitis, mild aortic arch atherosclerosis, otherwise unchanged. She was treated with IV steroids and discharged on advair HFA. Her admission was complicated by episode of SVT, cardiology iniated cardizem 240 mg  and discharged with zio patch.  ? ?On 02/25/2022 via zio cardiology noted possible A. Flutter, pt was  evaluated in cardiology office 02/26/2022 and they did not feel clear a. Fib/flutter, recommended continue zio, added metoprolol 20 mg BID, hold on DOAC with close follow up planned 03/06/2022. Patient denied any accompaniments with the episodes, denies episodes since.  ? ?Her blood pressure has been controlled at home, today their BP is BP: 108/62 ? She does workout (PT). She denies chest pain, shortness of breath, dizziness. ? ?She has hx of allergies/mild intermittent asthma, follows with Dr. Donneta Romberg for allergy shots and inhalers (recently off of shots). Hasn't followed up since recent admission, but states she called their office and was advised to stop advair and prescribed albuterol, has been taking 2 puffs BID per her understanding of their recommendation. I don't have access to notes, but discussed this would be an atypical recommendation with her today. Sx are well controlled, denies wheezing, dyspnea, cough.  ? ?R knee is healing well, doing PT, down to twice a week. Uses rare tramadol for pain. Reports had constipation but improved. Does note persistent fatigue following surgery, has been taking more naps.  ? ? ?  Latest Ref Rng & Units 02/24/2022  ?  2:19 AM 02/23/2022  ?  5:29 AM 02/22/2022  ?  1:32 AM  ?CBC  ?WBC 4.0 - 10.5 K/uL 10.9   11.6   15.1    ?Hemoglobin 12.0 - 15.0 g/dL 10.2   9.7   9.8    ?  Hematocrit 36.0 - 46.0 % 32.8   31.8   31.6    ?Platelets 150 - 400 K/uL 438   462   496    ? ? ?Reports fasting has been running about 150s, up from previous. Taking metformin. Admits has been drinking apple/orange juice.  ?Lab Results  ?Component Value Date  ? HGBA1C 6.5 (H) 12/09/2021  ? ?Lab Results  ?Component Value Date  ? NA 136 02/24/2022  ? K 4.0 02/24/2022  ? CL 108 02/24/2022  ? CO2 20 (L) 02/24/2022  ? GLUCOSE 220 (H) 02/24/2022  ? BUN 17 02/24/2022  ? CREATININE 0.74 02/24/2022  ? CALCIUM 8.7 (L) 02/24/2022  ? GFRAA 80 04/03/2021  ? GFRNONAA >60 02/24/2022  ? ? ?Home health is not involved.   ? ?Images while in the hospital: ? ?02/20/2022 ?CT ANGIOGRAPHY CHEST WITH CONTRAST ?   ?COMPARISON:  Portable chest earlier today, CTA chest 04/03/2016 ?  ?FINDINGS: ?Cardiovascular: The cardiac size is normal. Pulmonary vei

## 2022-03-04 ENCOUNTER — Ambulatory Visit (INDEPENDENT_AMBULATORY_CARE_PROVIDER_SITE_OTHER): Payer: Medicare HMO | Admitting: Adult Health

## 2022-03-04 ENCOUNTER — Encounter: Payer: Self-pay | Admitting: Adult Health

## 2022-03-04 VITALS — BP 108/62 | HR 106 | Temp 96.6°F | Wt 188.0 lb

## 2022-03-04 DIAGNOSIS — D649 Anemia, unspecified: Secondary | ICD-10-CM

## 2022-03-04 DIAGNOSIS — Z79899 Other long term (current) drug therapy: Secondary | ICD-10-CM | POA: Diagnosis not present

## 2022-03-04 DIAGNOSIS — E1122 Type 2 diabetes mellitus with diabetic chronic kidney disease: Secondary | ICD-10-CM

## 2022-03-04 DIAGNOSIS — I471 Supraventricular tachycardia: Secondary | ICD-10-CM

## 2022-03-04 DIAGNOSIS — J4520 Mild intermittent asthma, uncomplicated: Secondary | ICD-10-CM

## 2022-03-04 DIAGNOSIS — N182 Chronic kidney disease, stage 2 (mild): Secondary | ICD-10-CM | POA: Diagnosis not present

## 2022-03-04 DIAGNOSIS — K760 Fatty (change of) liver, not elsewhere classified: Secondary | ICD-10-CM

## 2022-03-04 DIAGNOSIS — I700 Atherosclerosis of aorta: Secondary | ICD-10-CM

## 2022-03-04 DIAGNOSIS — J45909 Unspecified asthma, uncomplicated: Secondary | ICD-10-CM

## 2022-03-04 LAB — CBC WITH DIFFERENTIAL/PLATELET
Absolute Monocytes: 440 cells/uL (ref 200–950)
Basophils Absolute: 18 cells/uL (ref 0–200)
Basophils Relative: 0.2 %
Eosinophils Absolute: 141 cells/uL (ref 15–500)
Eosinophils Relative: 1.6 %
HCT: 36.3 % (ref 35.0–45.0)
Hemoglobin: 11.1 g/dL — ABNORMAL LOW (ref 11.7–15.5)
Lymphs Abs: 3203 cells/uL (ref 850–3900)
MCH: 26.6 pg — ABNORMAL LOW (ref 27.0–33.0)
MCHC: 30.6 g/dL — ABNORMAL LOW (ref 32.0–36.0)
MCV: 87.1 fL (ref 80.0–100.0)
MPV: 11.1 fL (ref 7.5–12.5)
Monocytes Relative: 5 %
Neutro Abs: 4998 cells/uL (ref 1500–7800)
Neutrophils Relative %: 56.8 %
Platelets: 299 10*3/uL (ref 140–400)
RBC: 4.17 10*6/uL (ref 3.80–5.10)
RDW: 14.9 % (ref 11.0–15.0)
Total Lymphocyte: 36.4 %
WBC: 8.8 10*3/uL (ref 3.8–10.8)

## 2022-03-04 LAB — IRON,TIBC AND FERRITIN PANEL
%SAT: 18 % (calc) (ref 16–45)
Ferritin: 133 ng/mL (ref 16–288)
Iron: 53 ug/dL (ref 45–160)
TIBC: 295 mcg/dL (calc) (ref 250–450)

## 2022-03-04 LAB — BASIC METABOLIC PANEL WITH GFR
BUN: 12 mg/dL (ref 7–25)
CO2: 26 mmol/L (ref 20–32)
Calcium: 9.5 mg/dL (ref 8.6–10.4)
Chloride: 104 mmol/L (ref 98–110)
Creat: 0.9 mg/dL (ref 0.60–1.00)
Glucose, Bld: 170 mg/dL — ABNORMAL HIGH (ref 65–99)
Potassium: 4.4 mmol/L (ref 3.5–5.3)
Sodium: 138 mmol/L (ref 135–146)
eGFR: 69 mL/min/{1.73_m2} (ref >=60)

## 2022-03-04 NOTE — Patient Instructions (Signed)
? ? ? ?  Please clarify with Dr. Donneta Romberg - advair vs albuterol  ? ?Usually want a daily "maintenance" inhaler - daily  ?Albuterol ONLY AS NEEDED for wheezing or shortness of breath  ?

## 2022-03-05 ENCOUNTER — Telehealth: Payer: Self-pay

## 2022-03-05 DIAGNOSIS — M25661 Stiffness of right knee, not elsewhere classified: Secondary | ICD-10-CM | POA: Diagnosis not present

## 2022-03-05 DIAGNOSIS — M25561 Pain in right knee: Secondary | ICD-10-CM | POA: Diagnosis not present

## 2022-03-05 NOTE — Telephone Encounter (Signed)
Pt came into the office because the monitor will not stay on. She had the monitor in the gateway when we spoke in the exam room. Pt advised to send the monitor back along with the log book. She verbalized understanding. She states she has an appointment with Dr. [place] Rhyme in the morning.   ?

## 2022-03-06 ENCOUNTER — Ambulatory Visit: Payer: Medicare HMO | Admitting: Cardiology

## 2022-03-06 VITALS — BP 108/72 | HR 70 | Ht 63.0 in | Wt 186.0 lb

## 2022-03-06 DIAGNOSIS — R7989 Other specified abnormal findings of blood chemistry: Secondary | ICD-10-CM | POA: Diagnosis not present

## 2022-03-06 DIAGNOSIS — I471 Supraventricular tachycardia: Secondary | ICD-10-CM

## 2022-03-06 LAB — TSH: TSH: 2.05 u[IU]/mL (ref 0.450–4.500)

## 2022-03-06 LAB — T4, FREE: Free T4: 1.08 ng/dL (ref 0.82–1.77)

## 2022-03-06 MED ORDER — DILTIAZEM HCL ER COATED BEADS 300 MG PO CP24: 300.0000 mg | ORAL_CAPSULE | Freq: Every day | ORAL | 3 refills | Status: DC

## 2022-03-06 NOTE — Progress Notes (Addendum)
?Cardiology Office Note:   ? ?Date:  03/06/2022  ? ?ID:  Lauren Lopez, DOB [birth year], MRN [id number] ? ?PCP:  Unk Pinto, MD  ?Cardiologist:  Donato Heinz, MD  ?Electrophysiologist:  None  ? ?Referring MD: Unk Pinto, MD  ? ?Chief Complaint  ?Patient presents with  ? Follow-up  ?  Discuss monitor.  ? Irregular Heart Beat  ? ? ?History of Present Illness:   ? ?Lauren Lopez is a 71 y.o. female with a hx of hypertension, hyperlipidemia, T2DM, asthma, SVT who presents for follow-up.  She was admitted with asthma exacerbation in March 2023.  Cardiology was consulted as she was having frequent episodes of SVT.  Echocardiogram 02/23/2022 showed normal biventricular function, no significant valvular disease. ? ?Since discharge from the hospital, she reports he has been doing well.  Denies any chest pain, dyspnea, lightheadedness, syncope, lower extremity edema, or palpitations. ? ? ?Past Medical History:  ?Diagnosis Date  ? Acute asthma exacerbation 02/20/2022  ? Arthritis   ? "right hand, back" (04/03/2016)  ? Asthma   ? takes weekly allergy shots, none in 5-6 weeks due to knee surgery  ? Bilateral carpal tunnel syndrome   ? Chronic lower back pain   ? GERD (gastroesophageal reflux disease)   ? Hyperlipidemia   ? Hypertension   ? Multiple allergies   ? Pneumonia 03/2012  ? Positive PPD   ? "they told me it wasn't positive; I was allergic to the test itself"  ? Right rotator cuff tear 01/20/2013  ? Type II diabetes mellitus ([place])   ? 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 Vitamin deficiency   ? Wears dentures   ? upper  ? Wears glasses   ? ? ?Past Surgical History:  ?Procedure Laterality Date  ? ABDOMINAL HYSTERECTOMY    ? BILATERAL SALPINGOOPHORECTOMY  11/28/2010  ? open laparoscopy with adhesiolysis also  ? CARPAL TUNNEL RELEASE  07/15/2012  ? Procedure: CARPAL TUNNEL RELEASE;  Surgeon: Cammie Sickle., MD;  Location: [facility];  Service: Orthopedics;  Laterality: Left;  ? CARPAL  TUNNEL RELEASE Right 05/31/2014  ? Procedure: RIGHT CARPAL TUNNEL RELEASE;  Surgeon: Cammie Sickle, MD;  Location: [facility];  Service: Orthopedics;  Laterality: Right;  ? COLONOSCOPY    ? KNEE ARTHROSCOPY Bilateral 2005-2016  ? "right-left"  ? SHOULDER ARTHROSCOPY WITH ROTATOR CUFF REPAIR AND SUBACROMIAL DECOMPRESSION Right 01/20/2013  ? Procedure: RIGHT ARTHROSCOPY SHOULDER DEBRIDEMENT LIMITED, ARTHROSCOPY SHOULDER DECOMPRESSION SUBACROMIAL PARTIAL ACROMIOPLASTY WITH CORACOACROMIAL RELEASE, ROTATOR CUFF REPAIR ;  Surgeon: Johnny Bridge, MD;  Location: [facility];  Service: Orthopedics;  Laterality: Right;  RIGHT SHOULDER SCOPE DEBRIDEMENT, ACRIOMIOPLASTY, ROTATOR CUFF REPAIR  ? TOTAL KNEE ARTHROPLASTY Right 02/02/2022  ? Procedure: TOTAL KNEE ARTHROPLASTY;  Surgeon: Gaynelle Arabian, MD;  Location: [facility];  Service: Orthopedics;  Laterality: Right;  ? TUBAL LIGATION    ? ? ?Current Medications: ?Current Meds  ?Medication Sig  ? ADVAIR HFA 230-21 MCG/ACT inhaler Inhale 2 puffs into the lungs 2 (two) times daily.  ? albuterol (VENTOLIN HFA) 108 (90 Base) MCG/ACT inhaler Inhale 1-2 puffs into the lungs every 6 (six) hours as needed for wheezing or shortness of breath.  ? aspirin EC 81 MG tablet Take 81 mg by mouth daily. Swallow whole.  ? blood glucose meter kit and supplies Test sugars twice daily. Dx E11.9  ? EPINEPHrine 0.3 mg/0.3 mL IJ SOAJ injection   ? fexofenadine (ALLEGRA) 180 MG tablet Take 1  tablet (180 mg total) by mouth daily.  ? glucose blood (ONETOUCH ULTRA) test strip USE TO TEST SUGAR TWICE A DAY  ? Lancets (ONETOUCH DELICA PLUS [id number]) MISC TEST SUGAR TWICE A DAY  ? latanoprost (XALATAN) 0.005 % ophthalmic solution Place 1 drop into both eyes at bedtime.  ? metFORMIN (GLUCOPHAGE) 500 MG tablet Take 1 tab twice daily with food for diabetes. Do not increase dose due to causing diarrhea.  ? methocarbamol (ROBAXIN) 500 MG tablet Take 1 tablet (500 mg total) by mouth  every 6 (six) hours as needed for muscle spasms.  ? Misc Natural Products (NEURIVA PO) Take by mouth.  ? mometasone (NASONEX) 50 MCG/ACT nasal spray Place 2 sprays into the nose daily as needed (allergies).  ? omeprazole (PRILOSEC) 40 MG capsule Take one capsule daily for indigestion and heartburn  ? Phenylephrine-DM-GG-APAP (MUCINEX FAST-MAX CLD FLU THRT PO) Take by mouth.  ? Rhubarb (ESTROVEN MENOPAUSE RELIEF PO) Take 1 capsule by mouth daily.  ? rosuvastatin (CRESTOR) 20 MG tablet Take  1 tablet  Daily  for Cholesterol  ? [DISCONTINUED] diltiazem (CARDIZEM CD) 240 MG 24 hr capsule Take 1 capsule (240 mg total) by mouth daily.  ? [DISCONTINUED] metoprolol tartrate (LOPRESSOR) 25 MG tablet Take 1 tablet (25 mg total) by mouth 2 (two) times daily.  ?  ? ?Allergies:   Metronidazole and Ppd [tuberculin purified protein derivative]  ? ?Social History  ? ?Socioeconomic History  ? Marital status: Married  ?  Spouse name: Not on file  ? Number of children: Not on file  ? Years of education: Not on file  ? Highest education level: Not on file  ?Occupational History  ? Occupation: retired  ?Tobacco Use  ? Smoking status: Never  ? Smokeless tobacco: Never  ?Vaping Use  ? Vaping Use: Never used  ?Substance and Sexual Activity  ? Alcohol use: No  ? Drug use: No  ? Sexual activity: Not Currently  ?  Birth control/protection: Post-menopausal  ?Other Topics Concern  ? Not on file  ?Social History Narrative  ? Not on file  ? ?Social Determinants of Health  ? ?Financial Resource Strain: Not on file  ?Food Insecurity: Not on file  ?Transportation Needs: Not on file  ?Physical Activity: Not on file  ?Stress: Not on file  ?Social Connections: Not on file  ?  ? ?Family History: ?The patient's family history includes Alzheimer's disease in her father; Cirrhosis in her brother; Dementia in her mother; Diabetes in her mother; Hypertension in her brother, father, and mother; Tongue cancer in her sister. ? ?ROS:   ?Please see the history of  present illness.    ? All other systems reviewed and are negative. ? ?EKGs/Labs/Other Studies Reviewed:   ? ?The following studies were reviewed today: ? ? ?EKG:   ?03/06/22: Atrial tachycardia with block ? ?Recent Labs: ?12/09/2021: ALT 14 ?02/20/2022: B Natriuretic Peptide 8.6 ?02/22/2022: Magnesium 2.2 ?02/23/2022: TSH 0.277 ?03/04/2022: BUN 12; Creat 0.90; Hemoglobin 11.1; Platelets 299; Potassium 4.4; Sodium 138  ?Recent Lipid Panel ?   ?Component Value Date/Time  ? CHOL 188 07/07/2021 1143  ? TRIG 63 07/07/2021 1143  ? HDL 89 07/07/2021 1143  ? CHOLHDL 2.1 07/07/2021 1143  ? VLDL 28 07/08/2017 1515  ? [place] 85 07/07/2021 1143  ? ? ?Physical Exam:   ? ?VS:  BP 108/72 (BP Location: Left Arm, Patient Position: Sitting, Cuff Size: Normal)   Pulse 70   Ht 5' 3" (1.6 m)   Wt 186  lb (84.4 kg)   BMI 32.95 kg/m?    ? ?Wt Readings from Last 3 Encounters:  ?03/06/22 186 lb (84.4 kg)  ?03/04/22 188 lb (85.3 kg)  ?02/26/22 184 lb 6.4 oz (83.6 kg)  ?  ? ?GEN:  Well nourished, well developed in no acute distress ?HEENT: Normal ?NECK: No JVD; No carotid bruits ?CARDIAC: irregular, normal rhythm, no murmurs, rubs, gallops ?RESPIRATORY:  Clear to auscultation without rales, wheezing or rhonchi  ?ABDOMEN: Soft, non-tender, non-distended ?MUSCULOSKELETAL:  No edema; No deformity  ?SKIN: Warm and dry ?NEUROLOGIC:  Alert and oriented x 3 ?PSYCHIATRIC:  Normal affect  ? ?ASSESSMENT:   ? ?1. SVT (supraventricular tachycardia) ([place])   ?2. Low TSH level   ? ?PLAN:   ? ?SVT: Having runs of narrow complex tachycardia with rate up to 180s.  Differential includes AVNRT, AT or 2:1 atrial flutter.  No clear flutter waves seen.   Echocardiogram shows normal biventricular function, no significant valvular disease ?-Avoid beta-blockers given recent admission with asthma exacerbation.  Will discontinue metoprolol and increase diltiazem to 300 mg daily ?-On review of telemetry during hospitalization, does appear to be inverted P wave following T  wave, suspect this likely represents an atrial tachycardia.  EKG in clinic today appears likely AT with block ?-She was discharged with live Zio patch, received multiple calls from [employer] about heart rates up to 180s

## 2022-03-06 NOTE — Patient Instructions (Signed)
Medication Instructions:  ?STOP metoprolol  ?INCREASE diltiazem to 300 mg daily ? ?*If you need a refill on your cardiac medications before your next appointment, please call your pharmacy* ? ?Labs: ?TSH, FT4 today ? ?Follow-Up: ?At [facility], you and your health needs are our priority.  As part of our continuing mission to provide you with exceptional heart care, we have created designated Provider Care Teams.  These Care Teams include your primary Cardiologist (physician) and Advanced Practice Providers (APPs -  Physician Assistants and Nurse Practitioners) who all work together to provide you with the care you need, when you need it. ? ?We recommend signing up for the patient portal called "MyChart".  Sign up information is provided on this After Visit Summary.  MyChart is used to connect with patients for Virtual Visits (Telemedicine).  Patients are able to view lab/test results, encounter notes, upcoming appointments, etc.  Non-urgent messages can be sent to your provider as well.   ?To learn more about what you can do with MyChart, go to [email or web address].   ? ?Your next appointment:   ?3 month(s) ? ?The format for your next appointment:   ?In Person ? ?Provider:   ?Donato Heinz, MD { ? ? ? ?Other Instructions ?You have been referred to: Electrophysiology  ? ? ?

## 2022-03-10 DIAGNOSIS — J3089 Other allergic rhinitis: Secondary | ICD-10-CM | POA: Diagnosis not present

## 2022-03-10 DIAGNOSIS — J301 Allergic rhinitis due to pollen: Secondary | ICD-10-CM | POA: Diagnosis not present

## 2022-03-11 ENCOUNTER — Encounter: Payer: Medicare HMO | Admitting: Adult Health

## 2022-03-12 NOTE — Telephone Encounter (Signed)
[employer] has sent a replacement ZIO AT monitor serial # [id number] on 03/06/22.  According to [place] [phone], the package was refused by the receiver and will be returned to the sender.  [employer] was contacted to please provide final report based on data from patients first monitor. ?

## 2022-03-13 ENCOUNTER — Telehealth: Payer: Self-pay

## 2022-03-13 ENCOUNTER — Other Ambulatory Visit (HOSPITAL_COMMUNITY): Payer: Self-pay | Admitting: Cardiology

## 2022-03-13 DIAGNOSIS — I471 Supraventricular tachycardia: Secondary | ICD-10-CM

## 2022-03-13 NOTE — Telephone Encounter (Signed)
Received completed hardcopy of [employer]io documents, which is in the chart. Document placed in Dr. Newman Nickels mailbox. ?

## 2022-03-16 DIAGNOSIS — J3089 Other allergic rhinitis: Secondary | ICD-10-CM | POA: Diagnosis not present

## 2022-03-16 DIAGNOSIS — J301 Allergic rhinitis due to pollen: Secondary | ICD-10-CM | POA: Diagnosis not present

## 2022-03-20 DIAGNOSIS — Z5189 Encounter for other specified aftercare: Secondary | ICD-10-CM | POA: Diagnosis not present

## 2022-03-25 ENCOUNTER — Encounter: Payer: Self-pay | Admitting: *Deleted

## 2022-03-25 DIAGNOSIS — J3081 Allergic rhinitis due to animal (cat) (dog) hair and dander: Secondary | ICD-10-CM | POA: Diagnosis not present

## 2022-03-25 DIAGNOSIS — J3089 Other allergic rhinitis: Secondary | ICD-10-CM | POA: Diagnosis not present

## 2022-03-25 DIAGNOSIS — J301 Allergic rhinitis due to pollen: Secondary | ICD-10-CM | POA: Diagnosis not present

## 2022-03-30 ENCOUNTER — Ambulatory Visit: Payer: PRIVATE HEALTH INSURANCE | Admitting: Cardiology

## 2022-03-31 DIAGNOSIS — J3089 Other allergic rhinitis: Secondary | ICD-10-CM | POA: Diagnosis not present

## 2022-03-31 DIAGNOSIS — J301 Allergic rhinitis due to pollen: Secondary | ICD-10-CM | POA: Diagnosis not present

## 2022-03-31 DIAGNOSIS — J3081 Allergic rhinitis due to animal (cat) (dog) hair and dander: Secondary | ICD-10-CM | POA: Diagnosis not present

## 2022-04-08 DIAGNOSIS — J3089 Other allergic rhinitis: Secondary | ICD-10-CM | POA: Diagnosis not present

## 2022-04-08 DIAGNOSIS — J301 Allergic rhinitis due to pollen: Secondary | ICD-10-CM | POA: Diagnosis not present

## 2022-04-13 DIAGNOSIS — J301 Allergic rhinitis due to pollen: Secondary | ICD-10-CM | POA: Diagnosis not present

## 2022-04-13 DIAGNOSIS — J3089 Other allergic rhinitis: Secondary | ICD-10-CM | POA: Diagnosis not present

## 2022-04-20 ENCOUNTER — Encounter: Payer: Self-pay | Admitting: Internal Medicine

## 2022-04-20 ENCOUNTER — Ambulatory Visit: Payer: Medicare HMO | Admitting: Internal Medicine

## 2022-04-20 VITALS — BP 134/80 | HR 81 | Ht 63.0 in | Wt 188.0 lb

## 2022-04-20 DIAGNOSIS — I471 Supraventricular tachycardia: Secondary | ICD-10-CM | POA: Diagnosis not present

## 2022-04-20 NOTE — Progress Notes (Signed)
HPI Lauren Lopez is referred by Dr. Gardiner Rhyme for evaluation of SVT. She is a pleasant 71 yo woman with asthma, allergies and HTN. She does not have palpitations. She was found to have SVT when she was admitted with a viral uri/asthma exacerbation. She has been on fairly high dose cardizem. She wore a cardiac monitor several weeks ago and noted to have a long RP tachy which appeared to stop with AV block suggesting an AV node dependent etiology. She is out of rhythm about 20% of the time. She wears an Apple watch and cannot tell when she is out of rhythm.  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     albuterol (VENTOLIN HFA) 108 (90 Base) MCG/ACT inhaler Inhale 1-2 puffs into the lungs every 6 (six) hours as needed for wheezing or shortness of breath.     aspirin EC 81 MG tablet Take 81 mg by mouth daily. Swallow whole.     blood glucose meter kit and supplies Test sugars twice daily. Dx E11.9 1 each 0   diltiazem (CARDIZEM CD) 300 MG 24 hr capsule Take 1 capsule (300 mg total) by mouth daily. 90 capsule 3   diltiazem (CARDIZEM) 100 MG injection Inject 120 mg into the vein daily.     EPINEPHrine 0.3 mg/0.3 mL IJ SOAJ injection      fexofenadine (ALLEGRA) 180 MG tablet Take 1 tablet (180 mg total) by mouth daily. 90 tablet 6   glucose blood (ONETOUCH ULTRA) test strip USE TO TEST SUGAR TWICE A DAY 100 strip 3   Lancets (ONETOUCH DELICA PLUS [id number]) MISC TEST SUGAR TWICE A DAY 200 each 1   latanoprost (XALATAN) 0.005 % ophthalmic solution Place 1 drop into both eyes at bedtime.     metFORMIN (GLUCOPHAGE) 500 MG tablet Take 1 tab twice daily with  food for diabetes. Do not increase dose due to causing diarrhea. 180 tablet 3   methocarbamol (ROBAXIN) 500 MG tablet Take 1 tablet (500 mg total) by mouth every 6 (six) hours as needed for muscle spasms. 40 tablet 0   Misc Natural Products (NEURIVA PO) Take by mouth.     mometasone (NASONEX) 50 MCG/ACT nasal spray Place 2 sprays into the nose daily as needed (allergies).     omeprazole (PRILOSEC) 40 MG capsule Take one capsule daily for indigestion and heartburn 90 capsule 3   Phenylephrine-DM-GG-APAP (MUCINEX FAST-MAX CLD FLU THRT PO) Take by mouth.     Rhubarb (ESTROVEN MENOPAUSE RELIEF PO) Take 1 capsule by mouth daily.     rosuvastatin (CRESTOR) 20 MG tablet Take  1 tablet  Daily  for Cholesterol 90 tablet 3   No current facility-administered medications for this visit.     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   Past Surgical History:  Procedure Laterality Date   ABDOMINAL HYSTERECTOMY     BILATERAL SALPINGOOPHORECTOMY  11/28/2010   open laparoscopy with adhesiolysis also   CARPAL TUNNEL RELEASE  07/15/2012   Procedure: CARPAL TUNNEL RELEASE;  Surgeon: Cammie Sickle., MD;  Location: [facility];  Service: Orthopedics;  Laterality: Left;   CARPAL TUNNEL RELEASE Right 05/31/2014   Procedure:  RIGHT CARPAL TUNNEL RELEASE;  Surgeon: Cammie Sickle, MD;  Location: [facility];  Service: Orthopedics;  Laterality: Right;   COLONOSCOPY     KNEE ARTHROSCOPY Bilateral 2005-2016   "right-left"   SHOULDER ARTHROSCOPY WITH ROTATOR CUFF REPAIR AND SUBACROMIAL DECOMPRESSION Right 01/20/2013   Procedure: RIGHT ARTHROSCOPY SHOULDER DEBRIDEMENT LIMITED, ARTHROSCOPY SHOULDER DECOMPRESSION SUBACROMIAL PARTIAL ACROMIOPLASTY WITH CORACOACROMIAL RELEASE, ROTATOR CUFF REPAIR ;  Surgeon: Johnny Bridge, MD;  Location: [facility];  Service: Orthopedics;  Laterality: Right;  RIGHT SHOULDER SCOPE DEBRIDEMENT, ACRIOMIOPLASTY, ROTATOR CUFF REPAIR   TOTAL KNEE ARTHROPLASTY Right 02/02/2022   Procedure: TOTAL KNEE ARTHROPLASTY;  Surgeon: Gaynelle Arabian, MD;  [facility];  Service: Orthopedics;  Laterality: Right;   TUBAL LIGATION       Family History  Problem Relation Age of Onset   Hypertension Mother    Diabetes Mother    Dementia Mother    Hypertension Father    Alzheimer's disease Father    Tongue cancer Sister        smoker   Cirrhosis Brother    Hypertension Brother      Social History   Socioeconomic History   Marital status: Married    Spouse name: Not on file   Number of children: Not on file   Years of education: Not on file   Highest education level: Not on file  Occupational History   Occupation: retired  Tobacco Use   Smoking status: Never   Smokeless tobacco: Never  Vaping Use   Vaping Use: Never used  Substance and Sexual Activity   Alcohol use: No   Drug use: No   Sexual activity: Not Currently    Birth control/protection: Post-menopausal  Other Topics Concern   Not on file  Social History Narrative   Not on file   Social Determinants of Health   Financial Resource Strain: Not on file  Food Insecurity: Not on file  Transportation Needs: Not on file  Physical Activity: Not on file  Stress: Not on file  Social Connections: Not  on file  Intimate Partner Violence: Not on file     BP 134/80   Pulse 81   Ht '5\' 3"'  (1.6 m)   Wt 188 lb (85.3 kg)   SpO2 98%   BMI 33.30 kg/m   Physical Exam:  Well appearing NAD HEENT: Unremarkable Neck:  No JVD, no thyromegally Lymphatics:  No adenopathy Back:  No CVA tenderness Lungs:  Clear with no wheezes HEART:  Regular rate rhythm, no murmurs, no rubs, no clicks Abd:  soft,  positive bowel sounds, no organomegally, no rebound, no guarding Ext:  2 plus pulses, no edema, no cyanosis, no clubbing Skin:  No rashes no nodules Neuro:  CN II through XII intact, motor grossly intact  EKG - reviewed. NSR   Assess/Plan:  SVT - the mechanism is unclear though the finding that the tachy stops with a p wave would suggest an AV node dependent etiology. I cannot get her to tell me that she has any symptoms. At this point I have recommended watchful waiting and continued cardizem. I considered flecainide as well as ablation but the risk/benefit currently do not favor either. I'll see her back in 3 months to discuss further. HTN - her bp is fairly well controlled. We will follow.  Carleene Overlie Chealsey Miyamoto,MD

## 2022-04-20 NOTE — Patient Instructions (Addendum)
Medication Instructions:  Your physician recommends that you continue on your current medications as directed. Please refer to the Current Medication list given to you today. *If you need a refill on your cardiac medications before your next appointment, please call your pharmacy*  Lab Work: None. If you have labs (blood work) drawn today and your tests are completely normal, you will receive your results only by: [place] (if you have MyChart) OR A paper copy in the mail If you have any lab test that is abnormal or we need to change your treatment, we will call you to review the results.  Testing/Procedures: None.  Follow-Up: At [facility], you and your health needs are our priority.  As part of our continuing mission to provide you with exceptional heart care, we have created designated Provider Care Teams.  These Care Teams include your primary Cardiologist (physician) and Advanced Practice Providers (APPs -  Physician Assistants and Nurse Practitioners) who all work together to provide you with the care you need, when you need it.  Your physician wants you to follow-up in: 3 months with Cristopher Peru, MD  Appt 07/21/22 at 9:45 am  We recommend signing up for the patient portal called "MyChart".  Sign up information is provided on this After Visit Summary.  MyChart is used to connect with patients for Virtual Visits (Telemedicine).  Patients are able to view lab/test results, encounter notes, upcoming appointments, etc.  Non-urgent messages can be sent to your provider as well.   To learn more about what you can do with MyChart, go to [email or web address].    Any Other Special Instructions Will Be Listed Below (If Applicable).

## 2022-04-21 ENCOUNTER — Encounter: Payer: Self-pay | Admitting: Adult Health

## 2022-04-21 ENCOUNTER — Ambulatory Visit (INDEPENDENT_AMBULATORY_CARE_PROVIDER_SITE_OTHER): Payer: Medicare HMO | Admitting: Adult Health

## 2022-04-21 VITALS — BP 128/78 | HR 85 | Temp 97.3°F | Ht 63.0 in | Wt 188.0 lb

## 2022-04-21 DIAGNOSIS — E669 Obesity, unspecified: Secondary | ICD-10-CM

## 2022-04-21 DIAGNOSIS — E1169 Type 2 diabetes mellitus with other specified complication: Secondary | ICD-10-CM

## 2022-04-21 DIAGNOSIS — E538 Deficiency of other specified B group vitamins: Secondary | ICD-10-CM

## 2022-04-21 DIAGNOSIS — E559 Vitamin D deficiency, unspecified: Secondary | ICD-10-CM

## 2022-04-21 DIAGNOSIS — Z0000 Encounter for general adult medical examination without abnormal findings: Secondary | ICD-10-CM

## 2022-04-21 DIAGNOSIS — J3089 Other allergic rhinitis: Secondary | ICD-10-CM | POA: Diagnosis not present

## 2022-04-21 DIAGNOSIS — Z0001 Encounter for general adult medical examination with abnormal findings: Secondary | ICD-10-CM

## 2022-04-21 DIAGNOSIS — J3081 Allergic rhinitis due to animal (cat) (dog) hair and dander: Secondary | ICD-10-CM | POA: Diagnosis not present

## 2022-04-21 DIAGNOSIS — I700 Atherosclerosis of aorta: Secondary | ICD-10-CM

## 2022-04-21 DIAGNOSIS — I10 Essential (primary) hypertension: Secondary | ICD-10-CM

## 2022-04-21 DIAGNOSIS — N182 Chronic kidney disease, stage 2 (mild): Secondary | ICD-10-CM | POA: Diagnosis not present

## 2022-04-21 DIAGNOSIS — E1122 Type 2 diabetes mellitus with diabetic chronic kidney disease: Secondary | ICD-10-CM

## 2022-04-21 DIAGNOSIS — I471 Supraventricular tachycardia: Secondary | ICD-10-CM

## 2022-04-21 DIAGNOSIS — J301 Allergic rhinitis due to pollen: Secondary | ICD-10-CM | POA: Diagnosis not present

## 2022-04-21 DIAGNOSIS — Z96651 Presence of right artificial knee joint: Secondary | ICD-10-CM

## 2022-04-21 DIAGNOSIS — K219 Gastro-esophageal reflux disease without esophagitis: Secondary | ICD-10-CM

## 2022-04-21 DIAGNOSIS — M810 Age-related osteoporosis without current pathological fracture: Secondary | ICD-10-CM

## 2022-04-21 DIAGNOSIS — K760 Fatty (change of) liver, not elsewhere classified: Secondary | ICD-10-CM

## 2022-04-21 DIAGNOSIS — J4520 Mild intermittent asthma, uncomplicated: Secondary | ICD-10-CM

## 2022-04-21 DIAGNOSIS — Z79899 Other long term (current) drug therapy: Secondary | ICD-10-CM

## 2022-04-21 DIAGNOSIS — E785 Hyperlipidemia, unspecified: Secondary | ICD-10-CM | POA: Diagnosis not present

## 2022-04-21 NOTE — Progress Notes (Signed)
Encounter for Annual Physical Exam   Assessment:   Encounter for Annual Physical Exam with abnormal findings Due annually  Health Maintenance reviewed Healthy lifestyle reviewed and goals set  SVT (supraventricular tachycardia) ([place]) Currently controlled on cardizem; cardiology Dr. Lovena Le following Has 3 month follow up planned, possible ablation Follow up sooner if atypical fatigue, CP, dyspnea, palpitations, edema, dizziness, syncope/LOC  Aortic arch atherosclerosis ([place]) - mild per CTA 02/20/2022 Control blood pressure, cholesterol, glucose, increase exercise.   Essential hypertension - continue medications, DASH diet, exercise and monitor at home. Call if greater than 130/80.   Type 2 diabetes mellitus with CKD, without long-term current use of insulin ([name]) Discussed general issues about diabetes pathophysiology and management., Educational material distributed., Suggested low cholesterol diet., Encouraged aerobic exercise., Discussed foot care., Reminded to get yearly retinal exam. -     Hemoglobin A1c  Mixed hyperlipidemia associated with T2DM ([name]) LDL Goal <70; newly on rosuvastatin  decrease fatty foods increase activity. -     Lipid panel  Medication management -     Magnesium  Vitamin D deficiency Continue supplement, check levels annually and PRN  Age related osteoporosis, unspecified pathological fracture presence - on fosamax x 06/2017- 12/2019 - recent DEXA improved to osteopenia; follow up in 2 years - Continue high calcium diet, vit D supplement - restart fosamax as indicated  Obesity - BMI 33 - follow up 3 months for progress monitoring - increase veggies, decrease carbs - long discussion about weight loss, diet, and exercise  Hepatic steatosis (mild per CTA 02/20/2022) Weight loss with low processed carbohydrate diet recommended Limit tylenol/alcohol Monitor LFT trend and follow up US if trending up  Gastroesophageal reflux disease, esophagitis  presence not specified Continue PPI/H2 blocker, diet discussed  Mild intermittent asthma [place] allergy/asthma doing well with current regimen   Lipoma of right upper extremity Monitor; refer to gen surgery if needed  Hot flashes Has failed lexapro, paxil, effexor; GYN stopped estrogen; she is doing well with estroven complete. Weight loss encouraged  S/p R TKA Ortho/PT following, healing well  Following with ortho later this month  Orders Placed This Encounter  Procedures   CBC with Differential/Platelet   COMPLETE METABOLIC PANEL WITH GFR   Magnesium   Lipid panel   Hemoglobin A1c   VITAMIN D 25 Hydroxy (Vit-D Deficiency, Fractures)   Microalbumin / creatinine urine ratio   Urinalysis, Routine w reflex microscopic   Vitamin B12   HM DIABETES FOOT EXAM     Over 40 minutes of exam, counseling, chart review, and critical decision making was performed  Future Appointments  Date Time Provider [place]  06/12/2022  9:00 AM Donato Heinz, MD [facility]  07/07/2022 10:30 AM Darrol Jump, NP [facility] None  07/21/2022  9:45 AM Evans Lance, MD [facility]  04/22/2023  3:00 PM Magda Bernheim, NP [facility] None    Plan:   During the course of the visit the patient was educated and counseled about appropriate screening and preventive services including:   Pneumococcal vaccine  Prevnar 13 Influenza vaccine Td vaccine Screening electrocardiogram Bone densitometry screening Colorectal cancer screening Diabetes screening Glaucoma screening Nutrition counseling  Advanced directives: requested   Subjective:  Lauren Lopez is a 71 y.o. AA female who presents for CPE and 3 month follow up. She has Essential hypertension; GERD (gastroesophageal reflux disease); Vitamin D deficiency; Asthma; Medication management; Osteoporosis; Obesity (BMI 30.0-34.9); Hyperlipidemia associated with type 2 diabetes mellitus ([place]); CKD stage 2 due  to type 2 diabetes mellitus ([place]); Type 2 diabetes mellitus with stage 2 chronic kidney disease, without long-term current use of insulin ([place]); Lipoma of right upper extremity; Allergic rhinitis due to pollen; S/P total knee arthroplasty, right; Glaucoma; Prolonged QT interval; S/P knee replacement; SVT (supraventricular tachycardia) ([place]); 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 02/20/2022; and Hepatic steatosis on their problem list.  She is married, 2 grown daughter, 2 grandchildren live in [place], [place]. She is retired Scientist, forensic associated. Husband has leukemia, they are monitoring it, he also has dementia as well.   She is recently s/p R TKA by Dr. Wynelle Link in 01/2022, doing well, has follow up planned later this month.   She is s/p TAH, did have follow up with GYN. She does have  hot flashes, was on estrogen s/p hysterectomy but GYN tapered off; having sx day and night could not tolerate the lexapro, paxil, effexor, has been on estroven complete with benefit.   She follows with [place] allergy/asthma, on symbicort and reports well controlled, takes allegra.   BMI is Body mass index is 33.3 kg/m., she has been working on diet and exercise, she has been working on stepper and cycler. Admits irregular eating, does try to eat more salads. Has cut down on coffee from 5 cups to 2 cups/day.  Wt Readings from Last 3 Encounters:  04/21/22 188 lb (85.3 kg)  04/20/22 188 lb (85.3 kg)  03/06/22 186 lb (84.4 kg)   She follows with cardiology Dr. Gilman Schmidt and recently referred to Dr. Lovena Le due to some SVT seen while admitted in early 2023 for asthma exacerbation. Has been on cardizem 300 mg, HR frequently up to 120-150 but will resolve quickly/not persistent, patient monitors with apple watch, denies sx. She has 3 month follow up planned with Dr. Lovena Le to consider ablation or flecainide.   Her blood pressure has been controlled at home, today their BP is BP: 128/78 She does workout.  She denies  chest pain, shortness of breath, dizziness.   She has mild aortic atherosclerosis per CT 02/20/2022.   She is on cholesterol medication (newly on rosuvastatin 10 mg daily) and denies myalgias. Her cholesterol is not at goal. The cholesterol last visit was:   Lab Results  Component Value Date   CHOL 188 07/07/2021   HDL 89 07/07/2021   LDLCALC 85 07/07/2021   TRIG 63 07/07/2021   CHOLHDL 2.1 07/07/2021   She has been working on diet and exercise for diabetes with CKD, controlled with diet and metformin, she has worked hard to get it down, and denies foot ulcerations, hyperglycemia, hypoglycemia , increased appetite, nausea, paresthesia of the feet, polydipsia, polyuria, vomiting and weight loss.  She takes metformin 500 mg BID Reports fasting consistently <130 Last A1C in the office was:  Lab Results  Component Value Date   HGBA1C 6.5 (H) 12/09/2021   Last GFR Lab Results  Component Value Date   GFRAA 80 04/03/2021   Patient is not currently on Vitamin D supplement, was on 10000 IU daily  Lab Results  Component Value Date   VD25OH 72 04/03/2021   She reports hasn't been taking  Lab Results  Component Value Date   VITAMINB12 >2,000 (H) 07/07/2021     Medication Review:  Current Outpatient Medications (Endocrine & Metabolic):    metFORMIN (GLUCOPHAGE) 500 MG tablet, Take 1 tab twice daily with food for diabetes. Do not increase dose due to causing diarrhea.  Current Outpatient Medications (Cardiovascular):  diltiazem (CARDIZEM CD) 300 MG 24 hr capsule, Take 1 capsule (300 mg total) by mouth daily.   EPINEPHrine 0.3 mg/0.3 mL IJ SOAJ injection,    rosuvastatin (CRESTOR) 20 MG tablet, Take  1 tablet  Daily  for Cholesterol  Current Outpatient Medications (Respiratory):    ADVAIR HFA 373-42 MCG/ACT inhaler, Inhale 2 puffs into the lungs 2 (two) times daily.   albuterol (VENTOLIN HFA) 108 (90 Base) MCG/ACT inhaler, Inhale 1-2 puffs into the lungs every 6 (six) hours as  needed for wheezing or shortness of breath.   fexofenadine (ALLEGRA) 180 MG tablet, Take 1 tablet (180 mg total) by mouth daily.   mometasone (NASONEX) 50 MCG/ACT nasal spray, Place 2 sprays into the nose daily as needed (allergies).    Current Outpatient Medications (Other):    blood glucose meter kit and supplies, Test sugars twice daily. Dx E11.9   glucose blood (ONETOUCH ULTRA) test strip, USE TO TEST SUGAR TWICE A DAY   Lancets (ONETOUCH DELICA PLUS [id number]) MISC, TEST SUGAR TWICE A DAY   latanoprost (XALATAN) 0.005 % ophthalmic solution, Place 1 drop into both eyes at bedtime.   methocarbamol (ROBAXIN) 500 MG tablet, Take 1 tablet (500 mg total) by mouth every 6 (six) hours as needed for muscle spasms.   Misc Natural Products (NEURIVA PO), Take by mouth.   omeprazole (PRILOSEC) 40 MG capsule, Take one capsule daily for indigestion and heartburn   Rhubarb (ESTROVEN MENOPAUSE RELIEF PO), Take 1 capsule by mouth daily.  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 Redness and severe swelling at injection site    Current Problems (verified) has Essential hypertension; GERD (gastroesophageal reflux disease); Vitamin D deficiency; Asthma; Medication management; Osteoporosis; Obesity (BMI 30.0-34.9); Hyperlipidemia associated with type 2 diabetes mellitus ([place]); CKD stage 2 due to type 2 diabetes mellitus ([place]); Type 2 diabetes mellitus with stage 2 chronic kidney disease, without long-term current use of insulin ([place]); Lipoma of right upper extremity; Allergic rhinitis due to pollen; S/P total knee arthroplasty, right; Glaucoma; Prolonged QT interval; S/P knee replacement; SVT (supraventricular tachycardia) ([place]); 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 02/20/2022; and Hepatic steatosis on their problem list.  Screening  Tests Immunization History  Administered Date(s) Administered   Influenza, High Dose Seasonal PF 11/10/2017, 09/15/2018, 09/04/2019, 04/26/2020, 10/14/2021   Influenza, Seasonal, Injecte, Preservative Fre 10/17/2015   Influenza,inj,quad, With Preservative 10/15/2016   Influenza-Unspecified 08/30/2020   PFIZER(Purple Top)SARS-COV-2 Vaccination 12/22/2019, 01/12/2020, 04/26/2020, 04/01/2021   Pneumococcal Conjugate-13 12/22/2018   Pneumococcal Polysaccharide-23 02/04/2017, 04/25/2021   Pneumococcal-Unspecified 06/27/2013   Tdap 04/23/2012   Zoster, Live 07/31/2015   Health Maintenance  Topic Date Due   Zoster Vaccines- Shingrix (1 of 2) Never done   URINE MICROALBUMIN  01/01/2022   COVID-19 Vaccine (5 - Booster for [place] series) 05/07/2022 (Originally 05/27/2021)   TETANUS/TDAP  04/23/2022   HEMOGLOBIN A1C  06/08/2022   INFLUENZA VACCINE  06/30/2022   OPHTHALMOLOGY EXAM  07/10/2022   MAMMOGRAM  07/12/2022   FOOT EXAM  04/22/2023   DEXA SCAN  07/19/2023   COLONOSCOPY (Pts 45-82yr Insurance coverage will need to be confirmed)  09/09/2023   Pneumonia Vaccine 71 Years old  Completed   Hepatitis C Screening  Completed   HPV VACCINES  Aged Out   Last colonoscopy: 2019 had 6 polyps, 5 years Dr. mEarlean Shawl  Pap: Hysterectomy, DONE Mammogram:  goes to [facility] annually  DEXA: 07/2019 osteopenia, at [place] started fosamax 06/2017 but stopped due to jaw pain however doesn't believe this was r/t fosamax, had DEXA 2022, T - 1.4  Tetanus: 2013, get next visit  Shingrix: reports had, requested dates  Names of Other Physician/Practitioners you currently use: 1. [facility] here for primary care 2. Dr. Katy Fitch, eye doctor, last visit 06/2021, no retinopathy, has scheduled 3. Dr. Mariea Clonts, dentist, last visit 2023, goes q38m Patient Care Team: MUnk Pinto MD as PCP - General (Internal Medicine) SDonato Heinz MD as PCP - Cardiology  (Cardiology) Sypher, RHerbie Baltimore MD (Inactive) as Consulting Physician (Orthopedic Surgery) HMonna Fam MD as Consulting Physician (Ophthalmology) MRichmond Campbell MD as Consulting Physician (Gastroenterology) MCheri Fowler MD as Consulting Physician (Obstetrics and Gynecology) AGaynelle Arabian MD as Consulting Physician (Orthopedic Surgery) LMarchia Bond MD as Consulting Physician (Orthopedic Surgery) SMosetta Anis MD as Referring Physician (Allergy) MLarey Dresser MD as Consulting Physician (Cardiology)  Surgical: She  has a past surgical history that includes Bilateral salpingoophorectomy (11/28/2010); Knee arthroscopy (Bilateral, 2005-2016); Colonoscopy; Carpal tunnel release (07/15/2012); Shoulder arthroscopy with rotator cuff repair and subacromial decompression (Right, 01/20/2013); Abdominal hysterectomy; Carpal tunnel release (Right, 05/31/2014); Tubal ligation; and Total knee arthroplasty (Right, 02/02/2022). Family Her family history includes Alzheimer's disease in her father; COPD in her brother; Cirrhosis in her brother; Dementia in her mother; Diabetes in her mother; Hypertension in her brother, brother, father, and mother; Tongue cancer in her sister. Social history  She reports that she has never smoked. She has never used smokeless tobacco. She reports that she does not drink alcohol and does not use drugs.   Review of Systems  Constitutional:  Negative for malaise/fatigue and weight loss.  HENT:  Negative for hearing loss and tinnitus.   Eyes:  Negative for blurred vision and double vision.  Respiratory:  Negative for cough, shortness of breath and wheezing.   Cardiovascular:  Negative for chest pain, palpitations, orthopnea, claudication and leg swelling.  Gastrointestinal:  Negative for abdominal pain, blood in stool, constipation, diarrhea, heartburn, melena, nausea and vomiting.  Genitourinary: Negative.   Musculoskeletal:  Negative for joint pain (knee pain  improved, intermittent R hip pain, orth following) and myalgias.  Skin:  Negative for rash.  Neurological:  Negative for dizziness, tingling, sensory change, weakness and headaches.  Endo/Heme/Allergies:  Negative for polydipsia.  Psychiatric/Behavioral: Negative.    All other systems reviewed and are negative.   Objective:   Today's Vitals   04/21/22 1511  BP: 128/78  Pulse: 85  Temp: (!) 97.3 F (36.3 C)  SpO2: 99%  Weight: 188 lb (85.3 kg)  Height: '5\' 3"'  (1.6 m)     Body mass index is 33.3 kg/m.  General appearance: alert, no distress, WD/WN, female HEENT: normocephalic, sclerae anicteric, TMs pearly, nares patent, no discharge or erythema, pharynx normal Oral cavity: MMM, no lesions Neck: supple, no lymphadenopathy, no thyromegaly, no masses Heart: RRR, normal S1, S2, no murmurs Lungs: CTA bilaterally, no wheezes, rhonchi, or rales Abdomen: +bs, soft, non tender, non distended, no masses, no hepatomegaly, no splenomegaly Musculoskeletal: nontender, no swelling, no obvious deformity, normal gait Extremities: no edema, no cyanosis, no clubbing. She has approx 8 cm rubbery soft tissue mass to post R shoulder, clear borders.  Pulses: 2+ symmetric, upper and lower extremities, normal cap refill Neurological: alert, oriented x 3, CN2-12 intact, strength normal upper extremities and lower extremities, sensation normal throughout, intact  bil feet to monofilament, DTRs 2+ throughout, no cerebellar signs, gait normal Psychiatric: normal affect, behavior normal, pleasant  Skin: warm, dry, intact; no rash or concerning lesions. Well healed R knee midline surgical scar.   EKG: has had several recently; just saw cardiology yesterday; defer   Izora Ribas, NP   04/21/2022

## 2022-04-21 NOTE — Patient Instructions (Addendum)
Lauren Lopez , Thank you for taking time to come for your Annual Wellness Visit. I appreciate your ongoing commitment to your health goals. Please review the following plan we discussed and let me know if I can assist you in the future.   These are the goals we discussed:  Goals      Fasting Blood Glucose <130     LDL CALC < 70        This is a list of the screening recommended for you and due dates:  Health Maintenance  Topic Date Due   Zoster (Shingles) Vaccine (1 of 2) Never done   Urine Protein Check  01/01/2022   COVID-19 Vaccine (5 - Booster for Pfizer series) 05/07/2022*   Tetanus Vaccine  04/23/2022   Hemoglobin A1C  06/08/2022   Flu Shot  06/30/2022   Eye exam for diabetics  07/10/2022   Mammogram  07/12/2022   Complete foot exam   04/22/2023   DEXA scan (bone density measurement)  07/19/2023   Colon Cancer Screening  09/09/2023   Pneumonia Vaccine  Completed   Hepatitis C Screening: USPSTF Recommendation to screen - Ages 18-79 yo.  Completed   HPV Vaccine  Aged Out  *Topic was postponed. The date shown is not the original due date.           AVOID   - pseudoephedrine (Sudafed)- behind the counter, do not use if you have high blood pressure, medicine that have -D in them. - phenylephrine (Sudafed PE)  Medicines you can use  Nasal congestion Little Remedies saline spray (aerosol/mist)- can try this, it is in the kids section - pseudoephedrine (Sudafed)- behind the counter, do not use if you have high blood pressure, medicine that have -D in them. - phenylephrine (Sudafed PE) -Dextormethorphan + chlorpheniramine (Coridcidin HBP)- okay if you have high blood pressure -Oxymetazoline (Afrin) nasal spray- LIMIT to 3 days -Saline nasal spray -Neti pot (used distilled or bottled water)  Ear pain/congestion -pseudoephedrine (sudafed) - Nasonex/flonase nasal spray  Allergy symptoms (cough, sneeze, runny nose, itchy eyes) -Claritin or loratadine cheapest  but likely the weakest -Zyrtec or certizine at night because it can make you sleepy -The strongest is allegra or fexafinadine Cheapest at [employer], [employer], [employer]  Cough -Dextromethorphan (Delsym)- medicine that has DM in it -Guafenesin (Mucinex/Robitussin) - cough drops - drink lots of water  Chest Congestion -Guafenesin (Mucinex/Robitussin)     Osteopenia  Osteopenia is a loss of thickness (density) inside the bones. Another name for osteopenia is low bone mass. Mild osteopenia is a normal part of aging. It is not a disease, and it does not cause symptoms. However, if you have osteopenia and continue to lose bone mass, you could develop a condition that causes the bones to become thin and break more easily (osteoporosis). Osteoporosis can cause you to lose some height, have back pain, and have a stooped posture. Although osteopenia is not a disease, making changes to your lifestyle and diet can help to prevent osteopenia from developing into osteoporosis. What are the causes? Osteopenia is caused by loss of calcium in the bones. Bones are constantly changing. Old bone cells are continually being replaced with new bone cells. This process builds new bone. The mineral calcium is needed to build new bone and maintain bone density. Bone density is usually highest around age 6. After that, most people's bodies cannot replace all the bone they have lost with new bone. What increases the risk? You are more likely to develop  this condition if: You are older than age 39. You are a woman who went through menopause early. You have a long illness that keeps you in bed. You do not get enough exercise. You lack certain nutrients (malnutrition). You have an overactive thyroid gland (hyperthyroidism). You use products that contain nicotine or tobacco, such as cigarettes, e-cigarettes and chewing tobacco, or you drink a lot of alcohol. You are taking medicines that weaken the bones, such as  steroids. What are the signs or symptoms? This condition does not cause any symptoms. You may have a slightly higher risk for bone breaks (fractures), so getting fractures more easily than normal may be an indication of osteopenia. How is this diagnosed? This condition may be diagnosed based on an X-ray exam that measures bone density (dual-energy X-ray absorptiometry, or DEXA). This test can measure bone density in your hips, spine, and wrists. Osteopenia has no symptoms, so this condition is usually diagnosed after a routine bone density screening test is done for osteoporosis. This routine screening is usually done for: Women who are age 81 or older. Men who are age 7 or older. If you have risk factors for osteopenia, you may have the screening test at an earlier age. How is this treated? Making dietary and lifestyle changes can lower your risk for osteoporosis. If you have severe osteopenia that is close to becoming osteoporosis, this condition can be treated with medicines and dietary supplements such as calcium and vitamin D. These supplements help to rebuild bone density. Follow these instructions at home: Eating and drinking Eat a diet that is high in calcium and vitamin D. Calcium is found in dairy products, beans, salmon, and leafy green vegetables like spinach and broccoli. Look for foods that have vitamin D and calcium added to them (fortified foods), such as orange juice, cereal, and bread.  Lifestyle Do 30 minutes or more of a weight-bearing exercise every day, such as walking, jogging, or playing a sport. These types of exercises strengthen the bones. Do not use any products that contain nicotine or tobacco, such as cigarettes, e-cigarettes, and chewing tobacco. If you need help quitting, ask your health care provider. Do not drink alcohol if: Your health care provider tells you not to drink. You are pregnant, may be pregnant, or are planning to become pregnant. If you drink  alcohol: Limit how much you use to: 0-1 drink a day for women. 0-2 drinks a day for men. Be aware of how much alcohol is in your drink. In the [place] one drink equals one 12 oz bottle of beer (355 mL), one 5 oz glass of wine (148 mL), or one 1 oz glass of hard liquor (44 mL). General instructions Take over-the-counter and prescription medicines only as told by your health care provider. These include vitamins and supplements. Take precautions at home to lower your risk of falling, such as: Keeping rooms well-lit and free of clutter, such as cords. Installing safety rails on stairs. Using rubber mats in the bathroom or other areas that are often wet or slippery. Keep all follow-up visits. This is important. Contact a health care provider if: You have not had a bone density screening for osteoporosis and you are: A woman who is age 60 or older. A man who is age 74 or older. You are a postmenopausal woman who has not had a bone density screening for osteoporosis. You are older than age 65 and you want to know if you should have bone density screening for  osteoporosis. Summary Osteopenia is a loss of thickness (density) inside the bones. Another name for osteopenia is low bone mass. Osteopenia is not a disease, but it may increase your risk for a condition that causes the bones to become thin and break more easily (osteoporosis). You may be at risk for osteopenia if you are older than age 29 or if you are a woman who went through early menopause. Osteopenia does not cause any symptoms, but it can be diagnosed with a bone density screening test. Dietary and lifestyle changes are the first treatment for osteopenia. These may lower your risk for osteoporosis. This information is not intended to replace advice given to you by your health care provider. Make sure you discuss any questions you have with your health care provider. Document Revised: 05/02/2020 Document Reviewed: 05/02/2020 Elsevier  Patient Education  [place].

## 2022-04-22 LAB — COMPLETE METABOLIC PANEL WITH GFR
AG Ratio: 1.8 (calc) (ref 1.0–2.5)
ALT: 13 U/L (ref 6–29)
AST: 16 U/L (ref 10–35)
Albumin: 4.4 g/dL (ref 3.6–5.1)
Alkaline phosphatase (APISO): 102 U/L (ref 37–153)
BUN: 8 mg/dL (ref 7–25)
CO2: 28 mmol/L (ref 20–32)
Calcium: 10 mg/dL (ref 8.6–10.4)
Chloride: 106 mmol/L (ref 98–110)
Creat: 0.76 mg/dL (ref 0.60–1.00)
Globulin: 2.5 g/dL (calc) (ref 1.9–3.7)
Glucose, Bld: 124 mg/dL — ABNORMAL HIGH (ref 65–99)
Potassium: 4 mmol/L (ref 3.5–5.3)
Sodium: 144 mmol/L (ref 135–146)
Total Bilirubin: 0.3 mg/dL (ref 0.2–1.2)
Total Protein: 6.9 g/dL (ref 6.1–8.1)
eGFR: 84 mL/min/{1.73_m2} (ref >=60)

## 2022-04-22 LAB — LIPID PANEL
Cholesterol: 180 mg/dL (ref <200)
HDL: 94 mg/dL (ref >=50)
LDL Cholesterol (Calc): 73 mg/dL (calc)
Non-HDL Cholesterol (Calc): 86 mg/dL (calc) (ref <130)
Total CHOL/HDL Ratio: 1.9 (calc) (ref <5.0)
Triglycerides: 57 mg/dL (ref <150)

## 2022-04-22 LAB — URINALYSIS, ROUTINE W REFLEX MICROSCOPIC
Bilirubin Urine: NEGATIVE
Glucose, UA: NEGATIVE
Hgb urine dipstick: NEGATIVE
Ketones, ur: NEGATIVE
Leukocytes,Ua: NEGATIVE
Nitrite: NEGATIVE
Protein, ur: NEGATIVE
Specific Gravity, Urine: 1.006 (ref 1.001–1.035)
pH: 6.5 (ref 5.0–8.0)

## 2022-04-22 LAB — CBC WITH DIFFERENTIAL/PLATELET
Absolute Monocytes: 302 cells/uL (ref 200–950)
Basophils Absolute: 20 cells/uL (ref 0–200)
Basophils Relative: 0.3 %
Eosinophils Absolute: 87 cells/uL (ref 15–500)
Eosinophils Relative: 1.3 %
HCT: 38.3 % (ref 35.0–45.0)
Hemoglobin: 11.5 g/dL — ABNORMAL LOW (ref 11.7–15.5)
Lymphs Abs: 3136 cells/uL (ref 850–3900)
MCH: 25.8 pg — ABNORMAL LOW (ref 27.0–33.0)
MCHC: 30 g/dL — ABNORMAL LOW (ref 32.0–36.0)
MCV: 86.1 fL (ref 80.0–100.0)
MPV: 11.3 fL (ref 7.5–12.5)
Monocytes Relative: 4.5 %
Neutro Abs: 3156 cells/uL (ref 1500–7800)
Neutrophils Relative %: 47.1 %
Platelets: 307 10*3/uL (ref 140–400)
RBC: 4.45 10*6/uL (ref 3.80–5.10)
RDW: 14.2 % (ref 11.0–15.0)
Total Lymphocyte: 46.8 %
WBC: 6.7 10*3/uL (ref 3.8–10.8)

## 2022-04-22 LAB — MICROALBUMIN / CREATININE URINE RATIO
Creatinine, Urine: 33 mg/dL (ref 20–275)
Microalb, Ur: <0.2 mg/dL

## 2022-04-22 LAB — HEMOGLOBIN A1C
Hgb A1c MFr Bld: 6.3 % of total Hgb — ABNORMAL HIGH (ref <5.7)
Mean Plasma Glucose: 134 mg/dL
eAG (mmol/L): 7.4 mmol/L

## 2022-04-22 LAB — VITAMIN B12: Vitamin B-12: 625 pg/mL (ref 200–1100)

## 2022-04-22 LAB — VITAMIN D 25 HYDROXY (VIT D DEFICIENCY, FRACTURES): Vit D, 25-Hydroxy: 36 ng/mL (ref 30–100)

## 2022-04-22 LAB — MAGNESIUM: Magnesium: 1.8 mg/dL (ref 1.5–2.5)

## 2022-04-29 DIAGNOSIS — J301 Allergic rhinitis due to pollen: Secondary | ICD-10-CM | POA: Diagnosis not present

## 2022-04-29 DIAGNOSIS — J3081 Allergic rhinitis due to animal (cat) (dog) hair and dander: Secondary | ICD-10-CM | POA: Diagnosis not present

## 2022-04-29 DIAGNOSIS — J3089 Other allergic rhinitis: Secondary | ICD-10-CM | POA: Diagnosis not present

## 2022-05-05 DIAGNOSIS — J301 Allergic rhinitis due to pollen: Secondary | ICD-10-CM | POA: Diagnosis not present

## 2022-05-05 DIAGNOSIS — J3081 Allergic rhinitis due to animal (cat) (dog) hair and dander: Secondary | ICD-10-CM | POA: Diagnosis not present

## 2022-05-05 DIAGNOSIS — J3089 Other allergic rhinitis: Secondary | ICD-10-CM | POA: Diagnosis not present

## 2022-05-06 IMAGING — DX DG CHEST 1V PORT
1 series · 1 of 1 positions shown · non-contrast
Comparison: 04/03/2016

CLINICAL DATA: Shortness of breath

EXAM:
PORTABLE CHEST 1 VIEW

[chest]
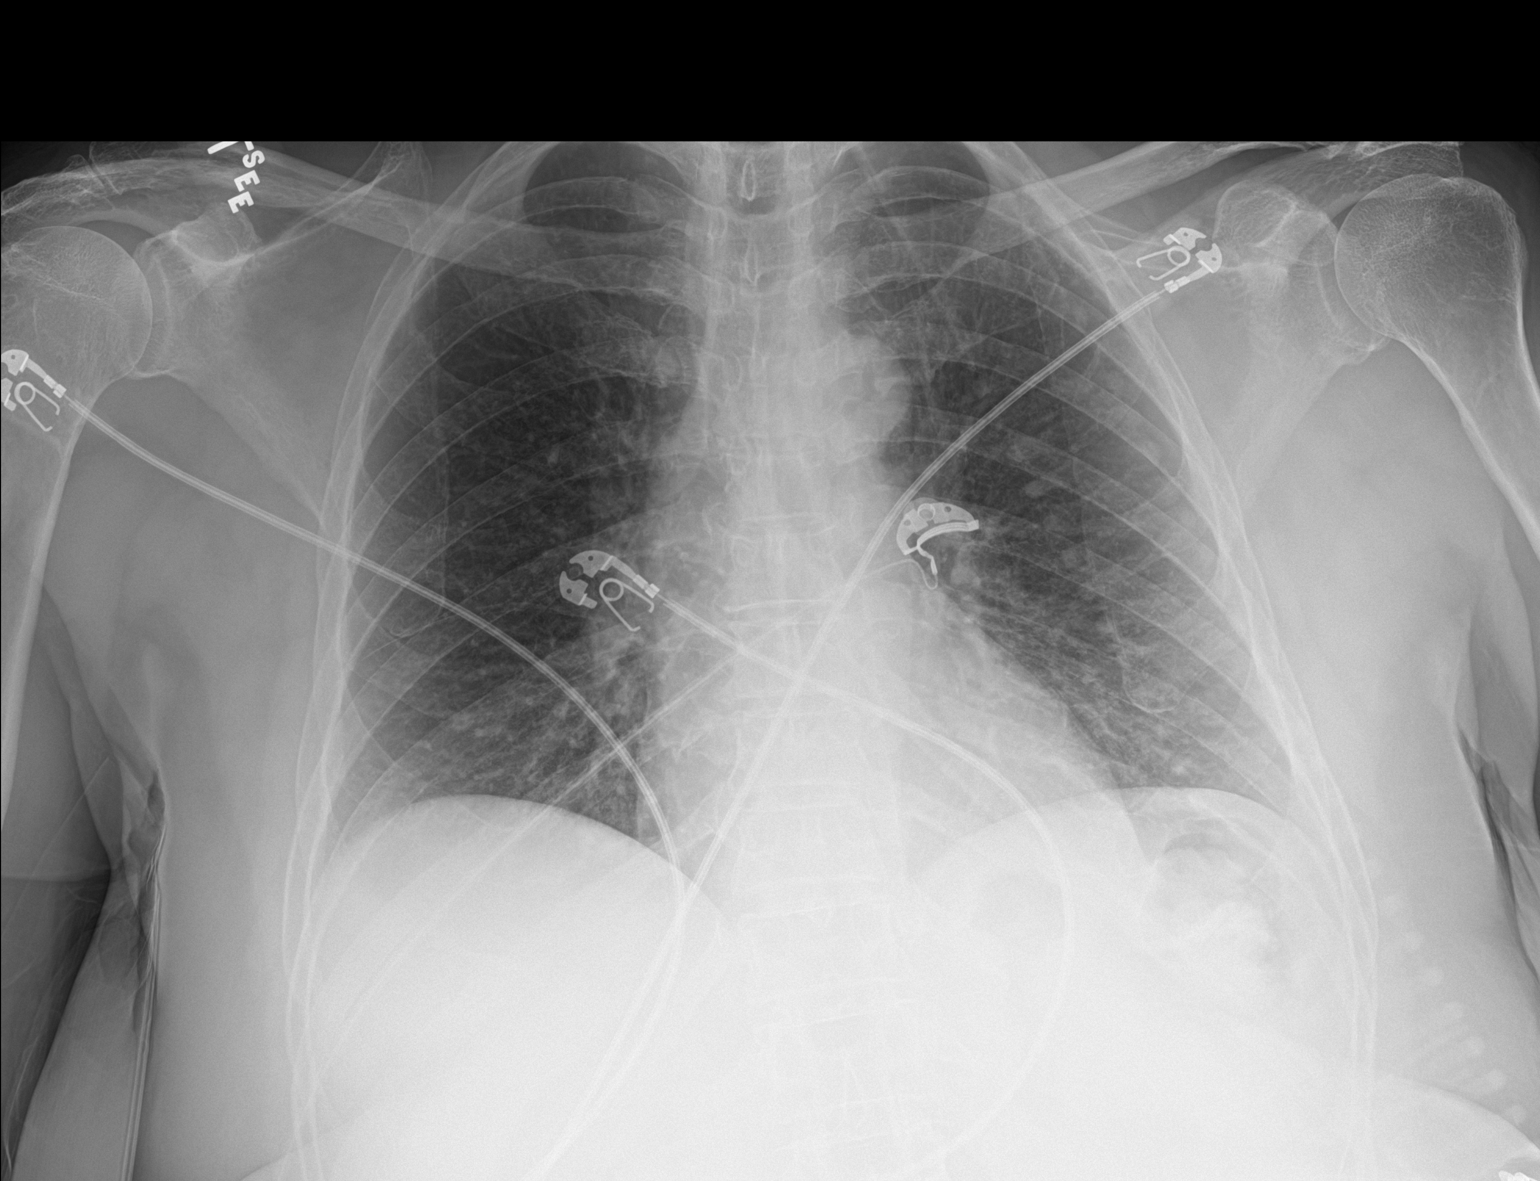

[1 of 1 positions shown; findings below may reference images not displayed]

FINDINGS: The heart size and mediastinal contours are within normal limits.
Both lungs are clear. The visualized skeletal structures are
unremarkable.
IMPRESSION: No active disease.

## 2022-05-06 IMAGING — CT CT ANGIO CHEST
2 of 6 series · 17 of 46 positions shown · IV contrast (agent unspecified)
Comparison: Portable chest earlier today, CTA chest 04/03/2016

CLINICAL DATA: History of asthma, suspected pulmonary embolism,
high probability.

EXAM:
CT ANGIOGRAPHY CHEST WITH CONTRAST
TECHNIQUE: Multidetector CT imaging of the chest was performed using the
standard protocol during bolus administration of intravenous
contrast. Multiplanar CT image reconstructions and MIPs were
obtained to evaluate the vascular anatomy.

[Series 6: thins · axial · 0.78mm/px · z∈[-496,-267]mm · 14 of 251 slices shown]
[im 11/251  lung]
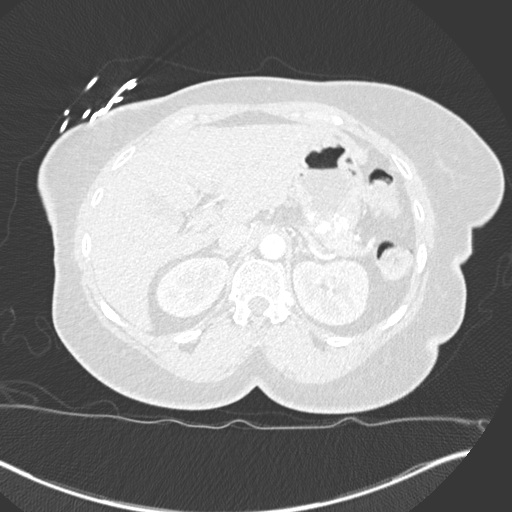
[im 33/251  soft-tissue]
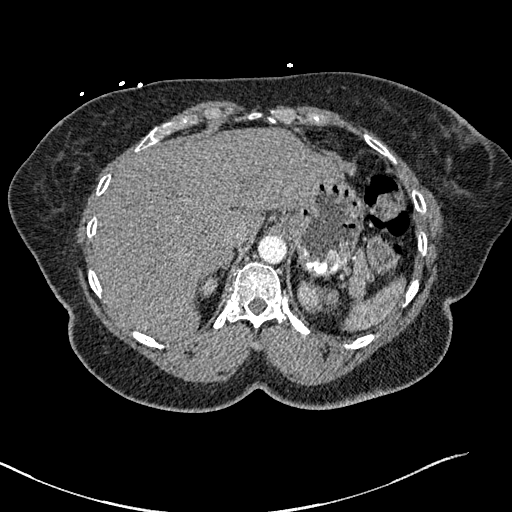
[im 44/251  lung]
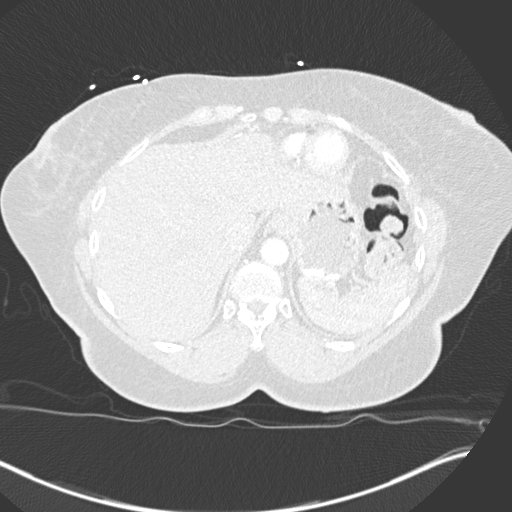
[im 66/251  soft-tissue]
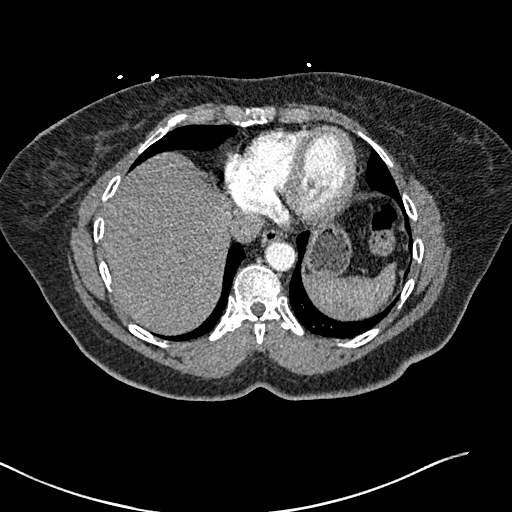
[im 87/251  lung]
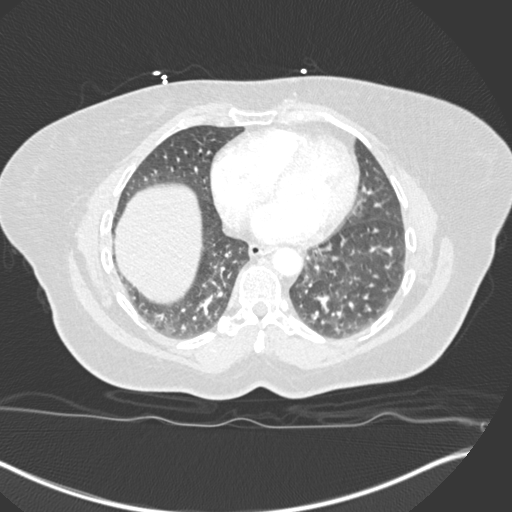
[im 98/251  soft-tissue]
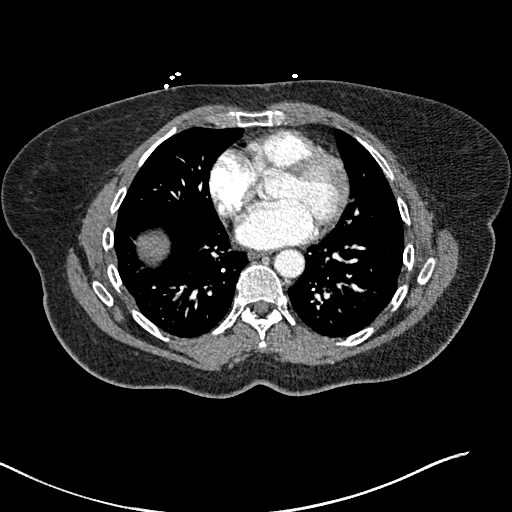
[im 120/251  lung]
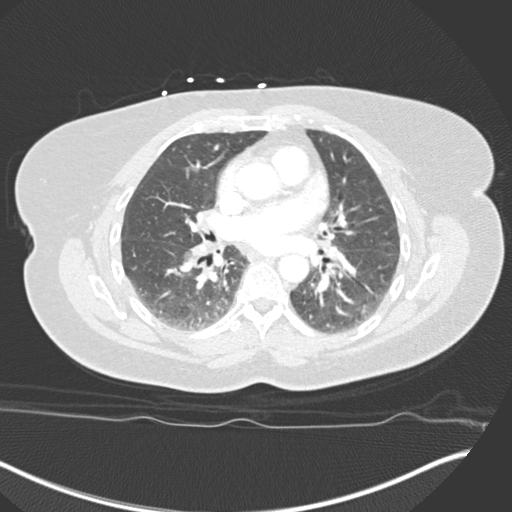
[im 131/251  soft-tissue]
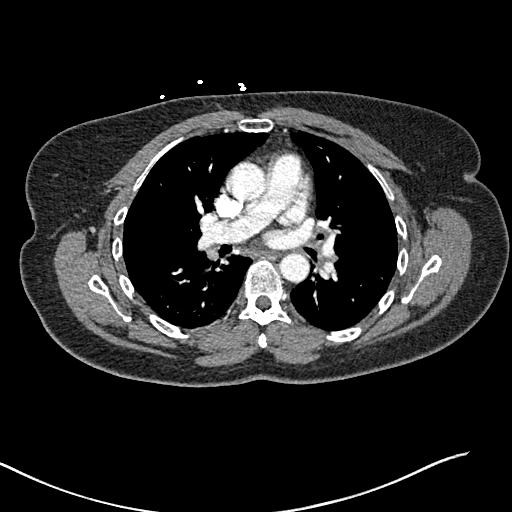
[im 153/251  lung]
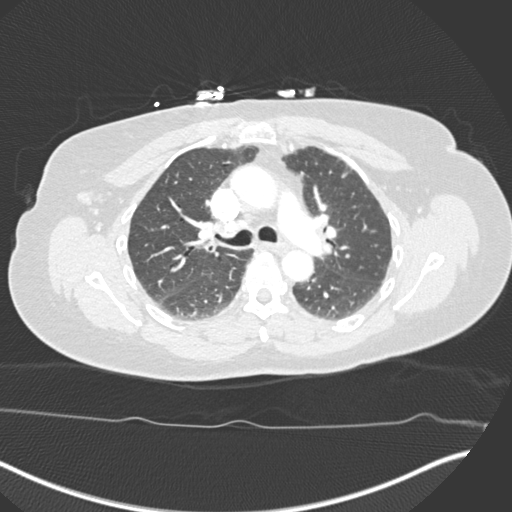
[im 164/251  soft-tissue]
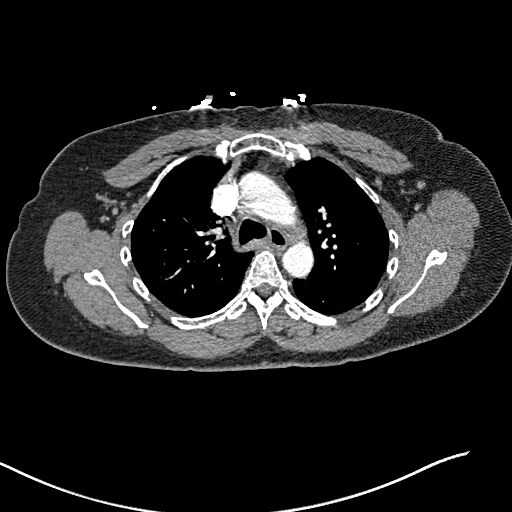
[im 185/251  lung]
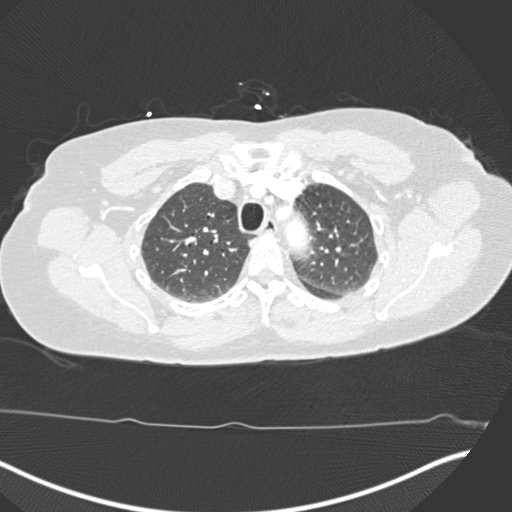
[im 207/251  soft-tissue]
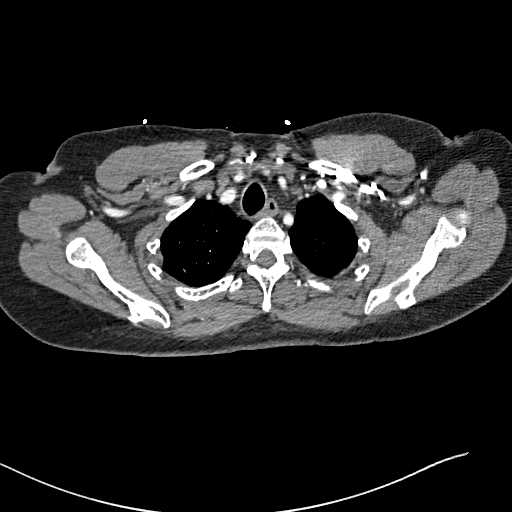
[im 218/251  lung]
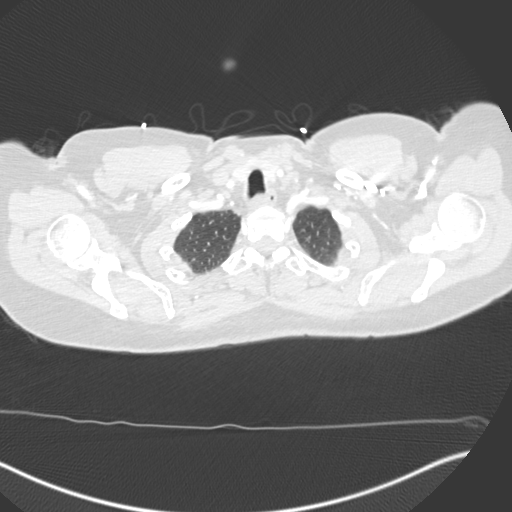
[im 240/251  soft-tissue]
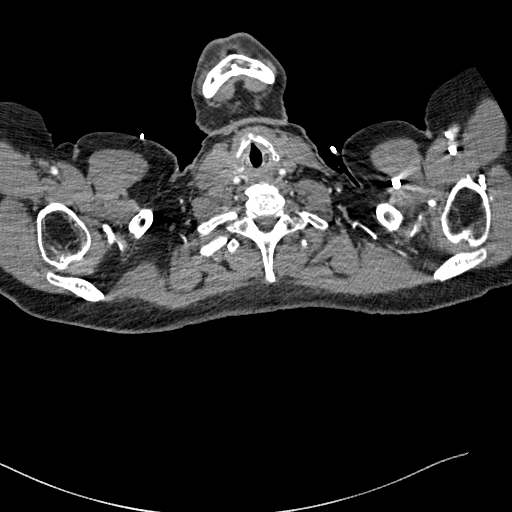

[Series 8: coronal mpr · coronal · 0.51mm/px · 3 of 136 slices shown]
[im 34/136  soft-tissue]
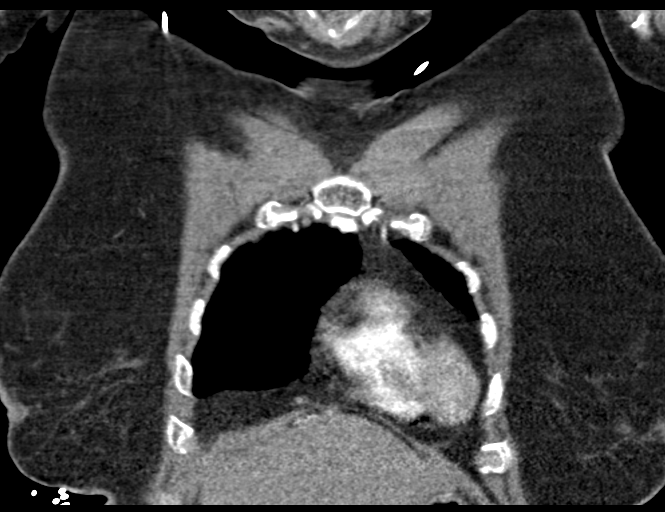
[im 68/136  soft-tissue]
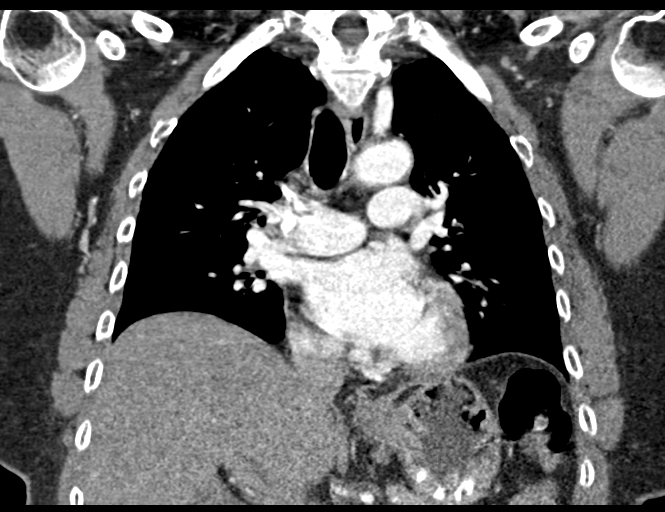
[im 102/136  soft-tissue]
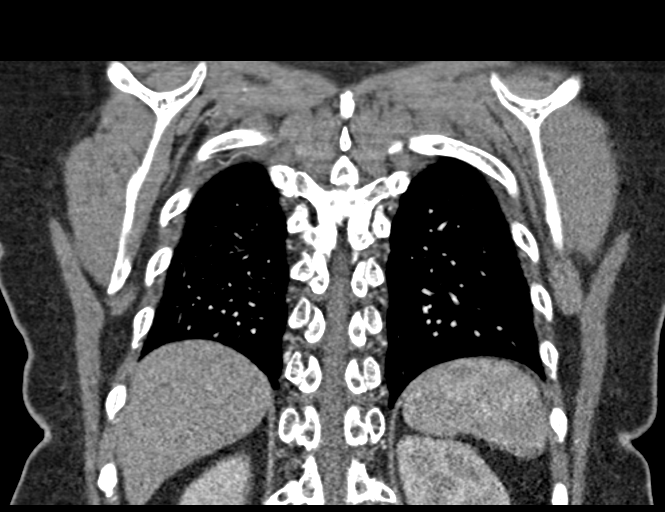

[17 of 46 positions shown; findings below may reference images not displayed]

RADIATION DOSE REDUCTION: This exam was performed according to the
departmental dose-optimization program which includes automated
exposure control, adjustment of the mA and/or kV according to
patient size and/or use of iterative reconstruction technique.

CONTRAST:  100mL OMNIPAQUE IOHEXOL 350 MG/ML SOLN
FINDINGS: Cardiovascular: The cardiac size is normal. Pulmonary veins are
decompressed. There is no appreciable coronary artery calcification
common no pericardial effusion. Small amount of aortic
atherosclerosis in the arch without aneurysm, further plaques or
dissection. The great vessels are within normal limits with
bilaterally patent subclavian arteries.

The pulmonary arteries are normal in caliber with no evidence of
arterial embolism.

Mediastinum/Nodes: Borderline sized bilateral axillary lymph nodes
are redemonstrated unchanged. There are shotty subcentimeter
bilateral hilar lymph nodes which are also unchanged. There is no
mediastinal adenopathy, thyroid mass or esophageal thickening, or
tracheal filling defect.

Lungs/Pleura: There is no pleural effusion, thickening or
pneumothorax. Posterior hazy atelectasis in the lower lobes is
similar to prior study. No infiltrate or nodule is seen. There is
mild bronchial thickening in both lower lobes, right middle lobe.
Central airways are clear.

Upper Abdomen: No acute abnormality. The liver is mildly steatotic,
and appears less steatotic than previously.

Musculoskeletal: No chest wall abnormality. No acute or significant
osseous findings.

Review of the MIP images confirms the above findings.
IMPRESSION: 1. No evidence of arterial dilatation or emboli.
2. Lower lobe and right middle lobe central bronchial thickening
consistent with bronchitis. No focal pneumonia is seen.
3. Small amount of aortic atherosclerosis. No dissection or
penetrating ulcer. No aneurysm.
4. Shotty lymph nodes, stable.
5. Mild hepatic steatosis.

## 2022-05-12 DIAGNOSIS — J301 Allergic rhinitis due to pollen: Secondary | ICD-10-CM | POA: Diagnosis not present

## 2022-05-12 DIAGNOSIS — J3089 Other allergic rhinitis: Secondary | ICD-10-CM | POA: Diagnosis not present

## 2022-05-12 DIAGNOSIS — J3081 Allergic rhinitis due to animal (cat) (dog) hair and dander: Secondary | ICD-10-CM | POA: Diagnosis not present

## 2022-05-19 DIAGNOSIS — J3081 Allergic rhinitis due to animal (cat) (dog) hair and dander: Secondary | ICD-10-CM | POA: Diagnosis not present

## 2022-05-19 DIAGNOSIS — J301 Allergic rhinitis due to pollen: Secondary | ICD-10-CM | POA: Diagnosis not present

## 2022-05-19 DIAGNOSIS — J3089 Other allergic rhinitis: Secondary | ICD-10-CM | POA: Diagnosis not present

## 2022-05-26 DIAGNOSIS — J3081 Allergic rhinitis due to animal (cat) (dog) hair and dander: Secondary | ICD-10-CM | POA: Diagnosis not present

## 2022-05-26 DIAGNOSIS — J301 Allergic rhinitis due to pollen: Secondary | ICD-10-CM | POA: Diagnosis not present

## 2022-05-26 DIAGNOSIS — J3089 Other allergic rhinitis: Secondary | ICD-10-CM | POA: Diagnosis not present

## 2022-06-03 DIAGNOSIS — J3089 Other allergic rhinitis: Secondary | ICD-10-CM | POA: Diagnosis not present

## 2022-06-03 DIAGNOSIS — J301 Allergic rhinitis due to pollen: Secondary | ICD-10-CM | POA: Diagnosis not present

## 2022-06-07 NOTE — Progress Notes (Signed)
Cardiology Office Note:    Date:  06/12/2022   ID:  Lauren Lopez, DOB 1951/10/30, MRN [id number]  PCP:  Unk Pinto, MD  Cardiologist:  Donato Heinz, MD  Electrophysiologist:  None   Referring MD: Unk Pinto, MD   Chief Complaint  Patient presents with   SVT    History of Present Illness:    Lauren Lopez is a 70 y.o. female with a hx of hypertension, hyperlipidemia, T2DM, asthma, SVT who presents for follow-up.  She was admitted with asthma exacerbation in March 2023.  Cardiology was consulted as she was having frequent episodes of SVT.  Echocardiogram 02/23/2022 showed normal biventricular function, no significant valvular disease.  Zio patch x9 days showed reported atrial flutter (23% burden) but on review I think these episodes are more likely atrial tachycardia with average rate 125 bpm.  She was seen by Dr. Lovena Le and felt to be likely SVT, recommended Cardizem and watchful waiting.  Since last clinic visit, she reports that she is doing well.  Denies any chest pain, dyspnea, lightheadedness, syncope, or palpitations.  Does report she feels tired all the time.  Does report some ankle edema.  Brought home BP log, has been 110s to 130s over 60s.  Past Medical History:  Diagnosis Date   Acute asthma exacerbation 02/20/2022   Arthritis    "right hand, back" (04/03/2016)   Asthma    takes weekly allergy shots, none in 5-6 weeks due to knee surgery   Bilateral carpal tunnel syndrome    Chronic lower back pain    GERD (gastroesophageal reflux disease)    Hyperlipidemia    Hypertension    Multiple allergies    Pneumonia 03/2012   Positive PPD    "they told me it wasn't positive; I was allergic to the test itself"   Right rotator cuff tear 01/20/2013   Type II diabetes mellitus ([place])    Vasculitis ([place]) 04/03/2016   Vitamin deficiency    Wears dentures    upper   Wears glasses     Past Surgical History:  Procedure Laterality Date   ABDOMINAL  HYSTERECTOMY     BILATERAL SALPINGOOPHORECTOMY  11/28/2010   open laparoscopy with adhesiolysis also   CARPAL TUNNEL RELEASE  07/15/2012   Procedure: CARPAL TUNNEL RELEASE;  Surgeon: Cammie Sickle., MD;  Location: [facility];  Service: Orthopedics;  Laterality: Left;   CARPAL TUNNEL RELEASE Right 05/31/2014   Procedure: RIGHT CARPAL TUNNEL RELEASE;  Surgeon: Cammie Sickle, MD;  Location: [facility];  Service: Orthopedics;  Laterality: Right;   COLONOSCOPY     KNEE ARTHROSCOPY Bilateral 2005-2016   "right-left"   SHOULDER ARTHROSCOPY WITH ROTATOR CUFF REPAIR AND SUBACROMIAL DECOMPRESSION Right 01/20/2013   Procedure: RIGHT ARTHROSCOPY SHOULDER DEBRIDEMENT LIMITED, ARTHROSCOPY SHOULDER DECOMPRESSION SUBACROMIAL PARTIAL ACROMIOPLASTY WITH CORACOACROMIAL RELEASE, ROTATOR CUFF REPAIR ;  Surgeon: Johnny Bridge, MD;  Location: [facility];  Service: Orthopedics;  Laterality: Right;  RIGHT SHOULDER SCOPE DEBRIDEMENT, ACRIOMIOPLASTY, ROTATOR CUFF REPAIR   TOTAL KNEE ARTHROPLASTY Right 02/02/2022   Procedure: TOTAL KNEE ARTHROPLASTY;  Surgeon: Gaynelle Arabian, MD;  Location: [facility];  Service: Orthopedics;  Laterality: Right;   TUBAL LIGATION      Current Medications: Current Meds  Medication Sig   ADVAIR HFA 230-21 MCG/ACT inhaler Inhale 2 puffs into the lungs 2 (two) times daily.   blood glucose meter kit and supplies Test sugars twice daily. Dx E11.9   diltiazem (CARDIZEM  CD) 300 MG 24 hr capsule Take 1 capsule (300 mg total) by mouth daily.   EPINEPHrine 0.3 mg/0.3 mL IJ SOAJ injection    fexofenadine (ALLEGRA) 180 MG tablet Take 1 tablet (180 mg total) by mouth daily.   glucose blood (ONETOUCH ULTRA) test strip USE TO TEST SUGAR TWICE A DAY   Lancets (ONETOUCH DELICA PLUS CLEXNT70Y) MISC TEST SUGAR TWICE A DAY   latanoprost (XALATAN) 0.005 % ophthalmic solution Place 1 drop into both eyes at bedtime.   Magnesium 250 MG TABS Take 250 mg by mouth  in the morning and at bedtime.   metFORMIN (GLUCOPHAGE) 500 MG tablet Take 1 tab twice daily with food for diabetes. Do not increase dose due to causing diarrhea.   methocarbamol (ROBAXIN) 500 MG tablet Take 1 tablet (500 mg total) by mouth every 6 (six) hours as needed for muscle spasms.   Misc Natural Products (NEURIVA PO) Take by mouth.   mometasone (NASONEX) 50 MCG/ACT nasal spray Place 2 sprays into the nose daily as needed (allergies).   omeprazole (PRILOSEC) 40 MG capsule Take one capsule daily for indigestion and heartburn   Rhubarb (ESTROVEN MENOPAUSE RELIEF PO) Take 1 capsule by mouth daily.   rosuvastatin (CRESTOR) 20 MG tablet Take  1 tablet  Daily  for Cholesterol   Vitamin D, Ergocalciferol, (DRISDOL) 1.25 MG (50000 UNIT) CAPS capsule Take 50,000 Units by mouth every 7 (seven) days.     Allergies:   Metronidazole and Ppd [tuberculin purified protein derivative]   Social History   Socioeconomic History   Marital status: Married    Spouse name: Not on file   Number of children: Not on file   Years of education: Not on file   Highest education level: Not on file  Occupational History   Occupation: retired  Tobacco Use   Smoking status: Never   Smokeless tobacco: Never  Vaping Use   Vaping Use: Never used  Substance and Sexual Activity   Alcohol use: No   Drug use: No   Sexual activity: Not Currently    Birth control/protection: Post-menopausal  Other Topics Concern   Not on file  Social History Narrative   Not on file   Social Determinants of Health   Financial Resource Strain: Not on file  Food Insecurity: Not on file  Transportation Needs: Not on file  Physical Activity: Not on file  Stress: Not on file  Social Connections: Not on file     Family History: The patient's family history includes Alzheimer's disease in her father; COPD in her brother; Cirrhosis in her brother; Dementia in her mother; Diabetes in her mother; Hypertension in her brother,  brother, father, and mother; Tongue cancer in her sister.  ROS:   Please see the history of present illness.     All other systems reviewed and are negative.  EKGs/Labs/Other Studies Reviewed:    The following studies were reviewed today:   EKG:   03/06/22: Atrial tachycardia with block  Recent Labs: 02/20/2022: B Natriuretic Peptide 8.6 03/06/2022: TSH 2.050 04/21/2022: ALT 13; BUN 8; Creat 0.76; Hemoglobin 11.5; Magnesium 1.8; Platelets 307; Potassium 4.0; Sodium 144  Recent Lipid Panel    Component Value Date/Time   CHOL 180 04/21/2022 1608   TRIG 57 04/21/2022 1608   HDL 94 04/21/2022 1608   CHOLHDL 1.9 04/21/2022 1608   VLDL 28 07/08/2017 1515   LDLCALC 73 04/21/2022 1608    Physical Exam:    VS:  BP 130/70 (BP Location: Left  Arm)   Pulse 76   Ht 5' 3" (1.6 m)   Wt 190 lb 12.8 oz (86.5 kg)   SpO2 96%   BMI 33.80 kg/m     Wt Readings from Last 3 Encounters:  06/12/22 190 lb 12.8 oz (86.5 kg)  04/21/22 188 lb (85.3 kg)  04/20/22 188 lb (85.3 kg)     GEN:  Well nourished, well developed in no acute distress HEENT: Normal NECK: No JVD; No carotid bruits CARDIAC: irregular, normal rhythm, no murmurs, rubs, gallops RESPIRATORY:  Clear to auscultation without rales, wheezing or rhonchi  ABDOMEN: Soft, non-tender, non-distended MUSCULOSKELETAL:  No edema; No deformity  SKIN: Warm and dry NEUROLOGIC:  Alert and oriented x 3 PSYCHIATRIC:  Normal affect   ASSESSMENT:    1. SVT (supraventricular tachycardia) ([place])   2. Daytime somnolence     PLAN:    SVT: Having runs of narrow complex tachycardia with rate up to 180s.  Differential includes AVNRT, AT or 2:1 atrial flutter.  No clear flutter waves seen.   Echocardiogram shows normal biventricular function, no significant valvular disease.  Normal TSH/free T4 02/2022 -Avoid beta-blockers given recent admission with asthma exacerbation.  Will continue diltiazem 300 mg daily -On review of telemetry during  hospitalization, does appear to be inverted P wave following T wave, suspect this likely represents an atrial tachycardia.   -Zio patch x9 days showed reported atrial flutter (23% burden) but on review I think these episodes are more likely atrial tachycardia with average rate 125 bpm.  She was seen by Dr. Lovena Le and felt to be likely SVT, recommended Cardizem and watchful waiting.    Daytime somnolence: Check sleep study  RTC in 6 months   Medication Adjustments/Labs and Tests Ordered: Current medicines are reviewed at length with the patient today.  Concerns regarding medicines are outlined above.  Orders Placed This Encounter  Procedures   Split night study   No orders of the defined types were placed in this encounter.   Patient Instructions  Medication Instructions:  Your physician recommends that you continue on your current medications as directed. Please refer to the Current Medication list given to you today.  *If you need a refill on your cardiac medications before your next appointment, please call your pharmacy*  Testing/Procedures: Your physician has recommended that you have a sleep study. This test records several body functions during sleep, including: brain activity, eye movement, oxygen and carbon dioxide blood levels, heart rate and rhythm, breathing rate and rhythm, the flow of air through your mouth and nose, snoring, body muscle movements, and chest and belly movement.  Follow-Up: At [facility], you and your health needs are our priority.  As part of our continuing mission to provide you with exceptional heart care, we have created designated Provider Care Teams.  These Care Teams include your primary Cardiologist (physician) and Advanced Practice Providers (APPs -  Physician Assistants and Nurse Practitioners) who all work together to provide you with the care you need, when you need it.  We recommend signing up for the patient portal called "MyChart".  Sign up  information is provided on this After Visit Summary.  MyChart is used to connect with patients for Virtual Visits (Telemedicine).  Patients are able to view lab/test results, encounter notes, upcoming appointments, etc.  Non-urgent messages can be sent to your provider as well.   To learn more about what you can do with MyChart, go to [email or web address].    Your next appointment:  6 month(s)  The format for your next appointment:   In Person  Provider:   Donato Heinz, MD {   Important Information About Sugar         Signed, Donato Heinz, MD  06/12/2022 9:41 AM    [place]

## 2022-06-11 DIAGNOSIS — J3089 Other allergic rhinitis: Secondary | ICD-10-CM | POA: Diagnosis not present

## 2022-06-11 DIAGNOSIS — J301 Allergic rhinitis due to pollen: Secondary | ICD-10-CM | POA: Diagnosis not present

## 2022-06-11 DIAGNOSIS — J3081 Allergic rhinitis due to animal (cat) (dog) hair and dander: Secondary | ICD-10-CM | POA: Diagnosis not present

## 2022-06-12 ENCOUNTER — Encounter: Payer: Self-pay | Admitting: Cardiology

## 2022-06-12 ENCOUNTER — Ambulatory Visit (INDEPENDENT_AMBULATORY_CARE_PROVIDER_SITE_OTHER): Payer: PRIVATE HEALTH INSURANCE | Admitting: Cardiology

## 2022-06-12 VITALS — BP 130/70 | HR 76 | Ht 63.0 in | Wt 190.8 lb

## 2022-06-12 DIAGNOSIS — I471 Supraventricular tachycardia: Secondary | ICD-10-CM | POA: Diagnosis not present

## 2022-06-12 DIAGNOSIS — R400 Somnolence: Secondary | ICD-10-CM

## 2022-06-12 NOTE — Patient Instructions (Signed)
Medication Instructions:  Your physician recommends that you continue on your current medications as directed. Please refer to the Current Medication list given to you today.  *If you need a refill on your cardiac medications before your next appointment, please call your pharmacy*  Testing/Procedures: Your physician has recommended that you have a sleep study. This test records several body functions during sleep, including: brain activity, eye movement, oxygen and carbon dioxide blood levels, heart rate and rhythm, breathing rate and rhythm, the flow of air through your mouth and nose, snoring, body muscle movements, and chest and belly movement.  Follow-Up: At [facility], you and your health needs are our priority.  As part of our continuing mission to provide you with exceptional heart care, we have created designated Provider Care Teams.  These Care Teams include your primary Cardiologist (physician) and Advanced Practice Providers (APPs -  Physician Assistants and Nurse Practitioners) who all work together to provide you with the care you need, when you need it.  We recommend signing up for the patient portal called "MyChart".  Sign up information is provided on this After Visit Summary.  MyChart is used to connect with patients for Virtual Visits (Telemedicine).  Patients are able to view lab/test results, encounter notes, upcoming appointments, etc.  Non-urgent messages can be sent to your provider as well.   To learn more about what you can do with MyChart, go to [email or web address].    Your next appointment:   6 month(s)  The format for your next appointment:   In Person  Provider:   Donato Heinz, MD {   Important Information About Sugar

## 2022-06-16 ENCOUNTER — Encounter: Payer: Self-pay | Admitting: Internal Medicine

## 2022-06-17 DIAGNOSIS — J3081 Allergic rhinitis due to animal (cat) (dog) hair and dander: Secondary | ICD-10-CM | POA: Diagnosis not present

## 2022-06-17 DIAGNOSIS — J301 Allergic rhinitis due to pollen: Secondary | ICD-10-CM | POA: Diagnosis not present

## 2022-06-17 DIAGNOSIS — J3089 Other allergic rhinitis: Secondary | ICD-10-CM | POA: Diagnosis not present

## 2022-06-22 DIAGNOSIS — J3089 Other allergic rhinitis: Secondary | ICD-10-CM | POA: Diagnosis not present

## 2022-06-22 DIAGNOSIS — J301 Allergic rhinitis due to pollen: Secondary | ICD-10-CM | POA: Diagnosis not present

## 2022-06-22 DIAGNOSIS — J3081 Allergic rhinitis due to animal (cat) (dog) hair and dander: Secondary | ICD-10-CM | POA: Diagnosis not present

## 2022-07-01 DIAGNOSIS — J301 Allergic rhinitis due to pollen: Secondary | ICD-10-CM | POA: Diagnosis not present

## 2022-07-01 DIAGNOSIS — J3081 Allergic rhinitis due to animal (cat) (dog) hair and dander: Secondary | ICD-10-CM | POA: Diagnosis not present

## 2022-07-01 DIAGNOSIS — J3089 Other allergic rhinitis: Secondary | ICD-10-CM | POA: Diagnosis not present

## 2022-07-02 ENCOUNTER — Telehealth: Payer: Self-pay | Admitting: *Deleted

## 2022-07-02 NOTE — Telephone Encounter (Signed)
Prior Authorization for split night sleep study sent to [facility] via web portal. Auth Number [id number]. Valid dates 07/02/22 to 12/29/22.

## 2022-07-07 ENCOUNTER — Ambulatory Visit: Payer: Medicare HMO | Admitting: Adult Health

## 2022-07-07 ENCOUNTER — Ambulatory Visit: Payer: Medicare HMO | Admitting: Nurse Practitioner

## 2022-07-09 DIAGNOSIS — J301 Allergic rhinitis due to pollen: Secondary | ICD-10-CM | POA: Diagnosis not present

## 2022-07-09 DIAGNOSIS — J3089 Other allergic rhinitis: Secondary | ICD-10-CM | POA: Diagnosis not present

## 2022-07-09 DIAGNOSIS — J3081 Allergic rhinitis due to animal (cat) (dog) hair and dander: Secondary | ICD-10-CM | POA: Diagnosis not present

## 2022-07-10 DIAGNOSIS — E785 Hyperlipidemia, unspecified: Secondary | ICD-10-CM | POA: Diagnosis not present

## 2022-07-10 DIAGNOSIS — E669 Obesity, unspecified: Secondary | ICD-10-CM | POA: Diagnosis not present

## 2022-07-10 DIAGNOSIS — I471 Supraventricular tachycardia: Secondary | ICD-10-CM | POA: Diagnosis not present

## 2022-07-10 DIAGNOSIS — Z6833 Body mass index (BMI) 33.0-33.9, adult: Secondary | ICD-10-CM | POA: Diagnosis not present

## 2022-07-10 DIAGNOSIS — K219 Gastro-esophageal reflux disease without esophagitis: Secondary | ICD-10-CM | POA: Diagnosis not present

## 2022-07-10 DIAGNOSIS — E119 Type 2 diabetes mellitus without complications: Secondary | ICD-10-CM | POA: Diagnosis not present

## 2022-07-10 DIAGNOSIS — I10 Essential (primary) hypertension: Secondary | ICD-10-CM | POA: Diagnosis not present

## 2022-07-10 DIAGNOSIS — Z7951 Long term (current) use of inhaled steroids: Secondary | ICD-10-CM | POA: Diagnosis not present

## 2022-07-10 DIAGNOSIS — H409 Unspecified glaucoma: Secondary | ICD-10-CM | POA: Diagnosis not present

## 2022-07-10 DIAGNOSIS — Z008 Encounter for other general examination: Secondary | ICD-10-CM | POA: Diagnosis not present

## 2022-07-10 DIAGNOSIS — R32 Unspecified urinary incontinence: Secondary | ICD-10-CM | POA: Diagnosis not present

## 2022-07-10 DIAGNOSIS — J45909 Unspecified asthma, uncomplicated: Secondary | ICD-10-CM | POA: Diagnosis not present

## 2022-07-10 DIAGNOSIS — M1990 Unspecified osteoarthritis, unspecified site: Secondary | ICD-10-CM | POA: Diagnosis not present

## 2022-07-14 DIAGNOSIS — J301 Allergic rhinitis due to pollen: Secondary | ICD-10-CM | POA: Diagnosis not present

## 2022-07-14 DIAGNOSIS — J3089 Other allergic rhinitis: Secondary | ICD-10-CM | POA: Diagnosis not present

## 2022-07-14 DIAGNOSIS — J3081 Allergic rhinitis due to animal (cat) (dog) hair and dander: Secondary | ICD-10-CM | POA: Diagnosis not present

## 2022-07-17 ENCOUNTER — Other Ambulatory Visit: Payer: Self-pay | Admitting: Nurse Practitioner

## 2022-07-17 DIAGNOSIS — Z961 Presence of intraocular lens: Secondary | ICD-10-CM | POA: Diagnosis not present

## 2022-07-17 DIAGNOSIS — H43823 Vitreomacular adhesion, bilateral: Secondary | ICD-10-CM | POA: Diagnosis not present

## 2022-07-17 DIAGNOSIS — E119 Type 2 diabetes mellitus without complications: Secondary | ICD-10-CM | POA: Diagnosis not present

## 2022-07-17 DIAGNOSIS — H20012 Primary iridocyclitis, left eye: Secondary | ICD-10-CM | POA: Diagnosis not present

## 2022-07-17 DIAGNOSIS — N182 Chronic kidney disease, stage 2 (mild): Secondary | ICD-10-CM

## 2022-07-20 DIAGNOSIS — H20012 Primary iridocyclitis, left eye: Secondary | ICD-10-CM | POA: Diagnosis not present

## 2022-07-21 ENCOUNTER — Ambulatory Visit: Payer: Medicare HMO | Admitting: Internal Medicine

## 2022-07-21 ENCOUNTER — Encounter: Payer: Self-pay | Admitting: Internal Medicine

## 2022-07-21 VITALS — BP 126/78 | HR 65 | Ht 63.0 in | Wt 191.0 lb

## 2022-07-21 DIAGNOSIS — I471 Supraventricular tachycardia: Secondary | ICD-10-CM | POA: Diagnosis not present

## 2022-07-21 DIAGNOSIS — I10 Essential (primary) hypertension: Secondary | ICD-10-CM | POA: Diagnosis not present

## 2022-07-21 NOTE — Patient Instructions (Addendum)
Medication Instructions:  Your physician recommends that you continue on your current medications as directed. Please refer to the Current Medication list given to you today.  *If you need a refill on your cardiac medications before your next appointment, please call your pharmacy*  NO CHANGES TO YOUR MEDICATIONS.   Lab Work: None ordered.  If you have labs (blood work) drawn today and your tests are completely normal, you will receive your results only by: [place] (if you have MyChart) OR A paper copy in the mail If you have any lab test that is abnormal or we need to change your treatment, we will call you to review the results.  Testing/Procedures: None ordered.  Follow-Up:  POSSIBLE ABLATION DATES TO CONSIDER:  SEPT:  8, 18, 22  OCT:  2, 4, 9  PT WILL CONTACT [facility] ON [address] WHEN READY TO SCHEDULE.  Important Information About Sugar      Cardiac Ablation Cardiac ablation is a procedure to destroy, or ablate, a small amount of heart tissue in very specific places. The heart has many electrical connections. Sometimes these connections are abnormal and can cause the heart to beat very fast or irregularly. Ablating some of the areas that cause problems can improve the heart's rhythm or return it to normal. Ablation may be done for people who: Have Wolff-Parkinson-White syndrome. Have fast heart rhythms (tachycardia). Have taken medicines for an abnormal heart rhythm (arrhythmia) that were not effective or caused side effects. Have a high-risk heartbeat that may be life-threatening. During the procedure, a small incision is made in the neck or the groin, and a long, thin tube (catheter) is inserted into the incision and moved to the heart. Small devices (electrodes) on the tip of the catheter will send out electrical currents. A type of X-ray (fluoroscopy) will be used to help guide the catheter and to provide images of the heart. Tell a health care provider  about: Any allergies you have. All medicines you are taking, including vitamins, herbs, eye drops, creams, and over-the-counter medicines. Any problems you or family members have had with anesthetic medicines. Any blood disorders you have. Any surgeries you have had. Any medical conditions you have, such as kidney failure. Whether you are pregnant or may be pregnant. What are the risks? Generally, this is a safe procedure. However, problems may occur, including: Infection. Bruising and bleeding at the catheter insertion site. Bleeding into the chest, especially into the sac that surrounds the heart. This is a serious complication. Stroke or blood clots. Damage to nearby structures or organs. Allergic reaction to medicines or dyes. Need for a permanent pacemaker if the normal electrical system is damaged. A pacemaker is a small computer that sends electrical signals to the heart and helps your heart beat normally. The procedure not being fully effective. This may not be recognized until months later. Repeat ablation procedures are sometimes done. What happens before the procedure? Medicines Ask your health care provider about: Changing or stopping your regular medicines. This is especially important if you are taking diabetes medicines or blood thinners. Taking medicines such as aspirin and ibuprofen. These medicines can thin your blood. Do not take these medicines unless your health care provider tells you to take them. Taking over-the-counter medicines, vitamins, herbs, and supplements. General instructions Follow instructions from your health care provider about eating or drinking restrictions. Plan to have someone take you home from the hospital or clinic. If you will be going home right after the procedure, plan  to have someone with you for 24 hours. Ask your health care provider what steps will be taken to prevent infection. What happens during the procedure?  An IV will be  inserted into one of your veins. You will be given a medicine to help you relax (sedative). The skin on your neck or groin will be numbed. An incision will be made in your neck or your groin. A needle will be inserted through the incision and into a large vein in your neck or groin. A catheter will be inserted into the needle and moved to your heart. Dye may be injected through the catheter to help your surgeon see the area of the heart that needs treatment. Electrical currents will be sent from the catheter to ablate heart tissue in desired areas. There are three types of energy that may be used to do this: Heat (radiofrequency energy). Laser energy. Extreme cold (cryoablation). When the tissue has been ablated, the catheter will be removed. Pressure will be held on the insertion area to prevent a lot of bleeding. A bandage (dressing) will be placed over the insertion area. The exact procedure may vary among health care providers and hospitals. What happens after the procedure? Your blood pressure, heart rate, breathing rate, and blood oxygen level will be monitored until you leave the hospital or clinic. Your insertion area will be monitored for bleeding. You will need to lie still for a few hours to ensure that you do not bleed from the insertion area. Do not drive for 24 hours or as long as told by your health care provider. Summary Cardiac ablation is a procedure to destroy, or ablate, a small amount of heart tissue using an electrical current. This procedure can improve the heart rhythm or return it to normal. Tell your health care provider about any medical conditions you may have and all medicines you are taking to treat them. This is a safe procedure, but problems may occur. Problems may include infection, bruising, damage to nearby organs or structures, or allergic reactions to medicines. Follow your health care provider's instructions about eating and drinking before the procedure.  You may also be told to change or stop some of your medicines. After the procedure, do not drive for 24 hours or as long as told by your health care provider. This information is not intended to replace advice given to you by your health care provider. Make sure you discuss any questions you have with your health care provider. Document Revised: 02/06/2022 Document Reviewed: 09/25/2019 Elsevier Patient Education  [place].

## 2022-07-21 NOTE — H&P (View-Only) (Signed)
HPI Ms. Lauren Lopez returns today for followup. She is a pleasant 71 yo woman with a h/o SVT who I saw  a couple of months ago. She does not have palpitation but was out of rhythm about 25% of the time. She notes that she has spells of sob which I strongly suspect are due to SVT. The mechanism of her SVT is unclear though I suspect atrial tachycardia. She has not had syncope and she denies anginal symptoms. She has been on medical therapy with fairly high dose cardizem.  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     albuterol (VENTOLIN HFA) 108 (90 Base) MCG/ACT inhaler Inhale 1-2 puffs into the lungs every 6 (six) hours as needed for wheezing or shortness of breath.     atropine 1 % ophthalmic solution Place 1 drop into the left eye daily.     blood glucose meter kit and supplies Test sugars twice daily. Dx E11.9 1 each 0   diltiazem (CARDIZEM CD) 300 MG 24 hr capsule Take 1 capsule (300 mg total) by mouth daily. 90 capsule 3   EPINEPHrine 0.3 mg/0.3 mL IJ SOAJ injection      fexofenadine (ALLEGRA) 180 MG tablet Take 1 tablet (180 mg total) by mouth daily. 90 tablet 6   Lancets (ONETOUCH DELICA PLUS TRRNHA57X) MISC TEST SUGAR TWICE A DAY 200 each 1   latanoprost (XALATAN) 0.005 % ophthalmic solution Place 1 drop into both eyes at bedtime.     Magnesium 250 MG TABS Take 250 mg by mouth in the morning and at bedtime.     metFORMIN (GLUCOPHAGE) 500 MG tablet Take 1 tab twice daily with food for diabetes. Do not increase dose due to causing diarrhea. 180 tablet 3   methocarbamol (ROBAXIN) 500 MG tablet Take 1 tablet (500 mg total) by mouth every 6 (six)  hours as needed for muscle spasms. 40 tablet 0   Misc Natural Products (NEURIVA PO) Take by mouth.     mometasone (NASONEX) 50 MCG/ACT nasal spray Place 2 sprays into the nose daily as needed (allergies).     omeprazole (PRILOSEC) 40 MG capsule Take one capsule daily for indigestion and heartburn 90 capsule 3   ONETOUCH ULTRA test strip USE TO TEST SUGAR TWICE A DAY 100 strip 3   Rhubarb (ESTROVEN MENOPAUSE RELIEF PO) Take 1 capsule by mouth daily.     rosuvastatin (CRESTOR) 20 MG tablet Take  1 tablet  Daily  for Cholesterol 90 tablet 3   Vitamin D, Ergocalciferol, (DRISDOL) 1.25 MG (50000 UNIT) CAPS capsule Take 50,000 Units by mouth every 7 (seven) days.     No current facility-administered medications for this visit.     Past Medical History:  Diagnosis Date   Acute asthma exacerbation 02/20/2022   Arthritis    "right hand, back" (04/03/2016)   Asthma    takes weekly allergy shots, none in 5-6 weeks due to knee surgery   Bilateral carpal tunnel syndrome    Chronic lower back pain    GERD (gastroesophageal reflux disease)    Hyperlipidemia    Hypertension  Multiple allergies    Pneumonia 03/2012   Positive PPD    "they told me it wasn't positive; I was allergic to the test itself"   Right rotator cuff tear 01/20/2013   Type II diabetes mellitus ([place])    Vasculitis ([place]) 04/03/2016   Vitamin deficiency    Wears dentures    upper   Wears glasses     ROS:   All systems reviewed and negative except as noted in the HPI.   Past Surgical History:  Procedure Laterality Date   ABDOMINAL HYSTERECTOMY     BILATERAL SALPINGOOPHORECTOMY  11/28/2010   open laparoscopy with adhesiolysis also   CARPAL TUNNEL RELEASE  07/15/2012   Procedure: CARPAL TUNNEL RELEASE;  Surgeon: Cammie Sickle., MD;  Location: [facility];  Service: Orthopedics;  Laterality: Left;   CARPAL TUNNEL RELEASE Right 05/31/2014   Procedure: RIGHT CARPAL TUNNEL RELEASE;  Surgeon: Cammie Sickle, MD;  Location: [place] [facility];  Service: Orthopedics;  Laterality: Right;   COLONOSCOPY     KNEE ARTHROSCOPY Bilateral 2005-2016   "right-left"   SHOULDER ARTHROSCOPY WITH ROTATOR CUFF REPAIR AND SUBACROMIAL DECOMPRESSION Right 01/20/2013   Procedure: RIGHT ARTHROSCOPY SHOULDER DEBRIDEMENT LIMITED, ARTHROSCOPY SHOULDER DECOMPRESSION SUBACROMIAL PARTIAL ACROMIOPLASTY WITH CORACOACROMIAL RELEASE, ROTATOR CUFF REPAIR ;  Surgeon: Johnny Bridge, MD;  Location: [facility];  Service: Orthopedics;  Laterality: Right;  RIGHT SHOULDER SCOPE DEBRIDEMENT, ACRIOMIOPLASTY, ROTATOR CUFF REPAIR   TOTAL KNEE ARTHROPLASTY Right 02/02/2022   Procedure: TOTAL KNEE ARTHROPLASTY;  Surgeon: Gaynelle Arabian, MD;  Location: [facility];  Service: Orthopedics;  Laterality: Right;   TUBAL LIGATION       Family History  Problem Relation Age of Onset   Hypertension Mother    Diabetes Mother    Dementia Mother    Hypertension Father    Alzheimer's disease Father    Tongue cancer Sister        smoker   Cirrhosis Brother    Hypertension Brother    COPD Brother        smoker   Hypertension Brother      Social History   Socioeconomic History   Marital status: Married    Spouse name: Not on file   Number of children: Not on file   Years of education: Not on file   Highest education level: Not on file  Occupational History   Occupation: retired  Tobacco Use   Smoking status: Never   Smokeless tobacco: Never  Vaping Use   Vaping Use: Never used  Substance and Sexual Activity   Alcohol use: No   Drug use: No   Sexual activity: Not Currently    Birth control/protection: Post-menopausal  Other Topics Concern   Not on file  Social History Narrative   Not on file   Social Determinants of Health   Financial Resource Strain: Not on file  Food Insecurity: Not on file  Transportation Needs: Not on file  Physical Activity: Not on file  Stress: Not on file  Social Connections:  Not on file  Intimate Partner Violence: Not on file     BP 126/78   Pulse 65   Ht 5' 3" (1.6 m)   Wt 191 lb (86.6 kg)   SpO2 96%   BMI 33.83 kg/m   Physical Exam:  Well appearing 71 yo woman, NAD HEENT: Unremarkable Neck:  No JVD, no thyromegally Lymphatics:  No adenopathy Back:  No CVA tenderness Lungs:  Clear with  no wheezes HEART:  Regular rate rhythm, no murmurs, no rubs, no clicks Abd:  soft, positive bowel sounds, no organomegally, no rebound, no guarding Ext:  2 plus pulses, no edema, no cyanosis, no clubbing Skin:  No rashes no nodules Neuro:  CN II through XII intact, motor grossly intact  EKG - nsr at 65/min  Assess/Plan:  SVT - while she does not have palpitations, she appears to be having more frequent periods of sob and I suspect that her dyspnea which is episodic is caused by SVT. I have offered her catheter ablation and she will call us if she wishes to proceed. HTN - her bp is controlled. We will follow.  Carleene Overlie Kati Riggenbach,MD

## 2022-07-21 NOTE — Progress Notes (Unsigned)
    HPI Lauren Lopez returns today for followup. She is a pleasant 70 yo woman with a h/o SVT who I saw  a couple of months ago. She does not have palpitation but was out of rhythm about 25% of the time. She notes that she has spells of sob which I strongly suspect are due to SVT. The mechanism of her SVT is unclear though I suspect atrial tachycardia. She has not had syncope and she denies anginal symptoms. She has been on medical therapy with fairly high dose cardizem.  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     albuterol (VENTOLIN HFA) 108 (90 Base) MCG/ACT inhaler Inhale 1-2 puffs into the lungs every 6 (six) hours as needed for wheezing or shortness of breath.     atropine 1 % ophthalmic solution Place 1 drop into the left eye daily.     blood glucose meter kit and supplies Test sugars twice daily. Dx E11.9 1 each 0   diltiazem (CARDIZEM CD) 300 MG 24 hr capsule Take 1 capsule (300 mg total) by mouth daily. 90 capsule 3   EPINEPHrine 0.3 mg/0.3 mL IJ SOAJ injection      fexofenadine (ALLEGRA) 180 MG tablet Take 1 tablet (180 mg total) by mouth daily. 90 tablet 6   Lancets (ONETOUCH DELICA PLUS LANCET33G) MISC TEST SUGAR TWICE A DAY 200 each 1   latanoprost (XALATAN) 0.005 % ophthalmic solution Place 1 drop into both eyes at bedtime.     Magnesium 250 MG TABS Take 250 mg by mouth in the morning and at bedtime.     metFORMIN (GLUCOPHAGE) 500 MG tablet Take 1 tab twice daily with food for diabetes. Do not increase dose due to causing diarrhea. 180 tablet 3   methocarbamol (ROBAXIN) 500 MG tablet Take 1 tablet (500 mg total) by mouth every 6 (six)  hours as needed for muscle spasms. 40 tablet 0   Misc Natural Products (NEURIVA PO) Take by mouth.     mometasone (NASONEX) 50 MCG/ACT nasal spray Place 2 sprays into the nose daily as needed (allergies).     omeprazole (PRILOSEC) 40 MG capsule Take one capsule daily for indigestion and heartburn 90 capsule 3   ONETOUCH ULTRA test strip USE TO TEST SUGAR TWICE A DAY 100 strip 3   Rhubarb (ESTROVEN MENOPAUSE RELIEF PO) Take 1 capsule by mouth daily.     rosuvastatin (CRESTOR) 20 MG tablet Take  1 tablet  Daily  for Cholesterol 90 tablet 3   Vitamin D, Ergocalciferol, (DRISDOL) 1.25 MG (50000 UNIT) CAPS capsule Take 50,000 Units by mouth every 7 (seven) days.     No current facility-administered medications for this visit.     Past Medical History:  Diagnosis Date   Acute asthma exacerbation 02/20/2022   Arthritis    "right hand, back" (04/03/2016)   Asthma    takes weekly allergy shots, none in 5-6 weeks due to knee surgery   Bilateral carpal tunnel syndrome    Chronic lower back pain    GERD (gastroesophageal reflux disease)    Hyperlipidemia    Hypertension      Multiple allergies    Pneumonia 03/2012   Positive PPD    "they told me it wasn't positive; I was allergic to the test itself"   Right rotator cuff tear 01/20/2013   Type II diabetes mellitus ([name])    Vasculitis ([name]) 04/03/2016   Vitamin deficiency    Wears dentures    upper   Wears glasses     ROS:   All systems reviewed and negative except as noted in the HPI.   Past Surgical History:  Procedure Laterality Date   ABDOMINAL HYSTERECTOMY     BILATERAL SALPINGOOPHORECTOMY  11/28/2010   open laparoscopy with adhesiolysis also   CARPAL TUNNEL RELEASE  07/15/2012   Procedure: CARPAL TUNNEL RELEASE;  Surgeon: Robert V Sypher Jr., MD;  Location: [facility];  Service: Orthopedics;  Laterality: Left;   CARPAL TUNNEL RELEASE Right 05/31/2014   Procedure: RIGHT CARPAL TUNNEL RELEASE;  Surgeon: Robert Sypher  Jr V, MD;  Location: [facility];  Service: Orthopedics;  Laterality: Right;   COLONOSCOPY     KNEE ARTHROSCOPY Bilateral 2005-2016   "right-left"   SHOULDER ARTHROSCOPY WITH ROTATOR CUFF REPAIR AND SUBACROMIAL DECOMPRESSION Right 01/20/2013   Procedure: RIGHT ARTHROSCOPY SHOULDER DEBRIDEMENT LIMITED, ARTHROSCOPY SHOULDER DECOMPRESSION SUBACROMIAL PARTIAL ACROMIOPLASTY WITH CORACOACROMIAL RELEASE, ROTATOR CUFF REPAIR ;  Surgeon: Joshua P Landau, MD;  Location: [facility];  Service: Orthopedics;  Laterality: Right;  RIGHT SHOULDER SCOPE DEBRIDEMENT, ACRIOMIOPLASTY, ROTATOR CUFF REPAIR   TOTAL KNEE ARTHROPLASTY Right 02/02/2022   Procedure: TOTAL KNEE ARTHROPLASTY;  Surgeon: Aluisio, Frank, MD;  Location: [facility];  Service: Orthopedics;  Laterality: Right;   TUBAL LIGATION       Family History  Problem Relation Age of Onset   Hypertension Mother    Diabetes Mother    Dementia Mother    Hypertension Father    Alzheimer's disease Father    Tongue cancer Sister        smoker   Cirrhosis Brother    Hypertension Brother    COPD Brother        smoker   Hypertension Brother      Social History   Socioeconomic History   Marital status: Married    Spouse name: Not on file   Number of children: Not on file   Years of education: Not on file   Highest education level: Not on file  Occupational History   Occupation: retired  Tobacco Use   Smoking status: Never   Smokeless tobacco: Never  Vaping Use   Vaping Use: Never used  Substance and Sexual Activity   Alcohol use: No   Drug use: No   Sexual activity: Not Currently    Birth control/protection: Post-menopausal  Other Topics Concern   Not on file  Social History Narrative   Not on file   Social Determinants of Health   Financial Resource Strain: Not on file  Food Insecurity: Not on file  Transportation Needs: Not on file  Physical Activity: Not on file  Stress: Not on file  Social Connections:  Not on file  Intimate Partner Violence: Not on file     BP 126/78   Pulse 65   Ht 5' 3" (1.6 m)   Wt 191 lb (86.6 kg)   SpO2 96%   BMI 33.83 kg/m   Physical Exam:  Well appearing 70 yo woman, NAD HEENT: Unremarkable Neck:  No JVD, no thyromegally Lymphatics:  No adenopathy Back:  No CVA tenderness Lungs:  Clear with   no wheezes HEART:  Regular rate rhythm, no murmurs, no rubs, no clicks Abd:  soft, positive bowel sounds, no organomegally, no rebound, no guarding Ext:  2 plus pulses, no edema, no cyanosis, no clubbing Skin:  No rashes no nodules Neuro:  CN II through XII intact, motor grossly intact  EKG - nsr at 65/min  Assess/Plan:  SVT - while she does not have palpitations, she appears to be having more frequent periods of sob and I suspect that her dyspnea which is episodic is caused by SVT. I have offered her catheter ablation and she will call us if she wishes to proceed. HTN - her bp is controlled. We will follow.  Lauren Lopez Lauren Beasley,MD

## 2022-07-22 DIAGNOSIS — J301 Allergic rhinitis due to pollen: Secondary | ICD-10-CM | POA: Diagnosis not present

## 2022-07-22 DIAGNOSIS — J3081 Allergic rhinitis due to animal (cat) (dog) hair and dander: Secondary | ICD-10-CM | POA: Diagnosis not present

## 2022-07-22 DIAGNOSIS — J3089 Other allergic rhinitis: Secondary | ICD-10-CM | POA: Diagnosis not present

## 2022-07-24 DIAGNOSIS — Z1231 Encounter for screening mammogram for malignant neoplasm of breast: Secondary | ICD-10-CM | POA: Diagnosis not present

## 2022-07-24 LAB — HM MAMMOGRAPHY

## 2022-07-26 ENCOUNTER — Ambulatory Visit (HOSPITAL_BASED_OUTPATIENT_CLINIC_OR_DEPARTMENT_OTHER): Payer: Medicare HMO | Attending: Cardiology | Admitting: Cardiology

## 2022-07-26 DIAGNOSIS — I471 Supraventricular tachycardia: Secondary | ICD-10-CM

## 2022-07-26 DIAGNOSIS — I10 Essential (primary) hypertension: Secondary | ICD-10-CM | POA: Diagnosis present

## 2022-07-26 DIAGNOSIS — I441 Atrioventricular block, second degree: Secondary | ICD-10-CM | POA: Diagnosis not present

## 2022-07-26 DIAGNOSIS — R0683 Snoring: Secondary | ICD-10-CM | POA: Diagnosis not present

## 2022-07-26 DIAGNOSIS — G4761 Periodic limb movement disorder: Secondary | ICD-10-CM | POA: Diagnosis not present

## 2022-07-28 ENCOUNTER — Encounter: Payer: Self-pay | Admitting: Nurse Practitioner

## 2022-07-28 ENCOUNTER — Ambulatory Visit (INDEPENDENT_AMBULATORY_CARE_PROVIDER_SITE_OTHER): Payer: Medicare HMO | Admitting: Nurse Practitioner

## 2022-07-28 VITALS — BP 118/64 | HR 79 | Temp 97.5°F | Ht 63.0 in | Wt 186.8 lb

## 2022-07-28 DIAGNOSIS — E669 Obesity, unspecified: Secondary | ICD-10-CM | POA: Diagnosis not present

## 2022-07-28 DIAGNOSIS — E1169 Type 2 diabetes mellitus with other specified complication: Secondary | ICD-10-CM

## 2022-07-28 DIAGNOSIS — L02222 Furuncle of back [any part, except buttock]: Secondary | ICD-10-CM

## 2022-07-28 DIAGNOSIS — N182 Chronic kidney disease, stage 2 (mild): Secondary | ICD-10-CM | POA: Diagnosis not present

## 2022-07-28 DIAGNOSIS — M545 Low back pain, unspecified: Secondary | ICD-10-CM

## 2022-07-28 DIAGNOSIS — Z79899 Other long term (current) drug therapy: Secondary | ICD-10-CM

## 2022-07-28 DIAGNOSIS — K219 Gastro-esophageal reflux disease without esophagitis: Secondary | ICD-10-CM | POA: Diagnosis not present

## 2022-07-28 DIAGNOSIS — R232 Flushing: Secondary | ICD-10-CM

## 2022-07-28 DIAGNOSIS — E559 Vitamin D deficiency, unspecified: Secondary | ICD-10-CM

## 2022-07-28 DIAGNOSIS — E1122 Type 2 diabetes mellitus with diabetic chronic kidney disease: Secondary | ICD-10-CM | POA: Diagnosis not present

## 2022-07-28 DIAGNOSIS — R6889 Other general symptoms and signs: Secondary | ICD-10-CM

## 2022-07-28 DIAGNOSIS — I10 Essential (primary) hypertension: Secondary | ICD-10-CM

## 2022-07-28 DIAGNOSIS — E785 Hyperlipidemia, unspecified: Secondary | ICD-10-CM | POA: Diagnosis not present

## 2022-07-28 DIAGNOSIS — M810 Age-related osteoporosis without current pathological fracture: Secondary | ICD-10-CM | POA: Diagnosis not present

## 2022-07-28 DIAGNOSIS — Z0001 Encounter for general adult medical examination with abnormal findings: Secondary | ICD-10-CM

## 2022-07-28 DIAGNOSIS — Z0000 Encounter for general adult medical examination without abnormal findings: Secondary | ICD-10-CM

## 2022-07-28 MED ORDER — ADVAIR HFA 230-21 MCG/ACT IN AERO
2.0000 | INHALATION_SPRAY | Freq: Two times a day (BID) | RESPIRATORY_TRACT | 2 refills | Status: DC
Start: 2022-07-28 — End: 2022-10-23

## 2022-07-28 NOTE — Progress Notes (Signed)
AWV and 3 MONTH FOLLOW UP Assessment:   Annual Medicare Wellness Visit Due annually  Health maintenance reviewed DEXA ordered to schedule at [place] hypertension Continue medications.  Discussed DASH (Dietary Approaches to Stop Hypertension) DASH diet is lower in sodium than a typical American diet. Cut back on foods that are high in saturated fat, cholesterol, and trans fats. Eat more whole-grain foods, fish, poultry, and nuts Remain active and exercise as tolerated daily.  Monitor BP at home-Call if greater than 130/80.  Check CMP/CBC Continue to follow with Cardiology.  Type 2 diabetes mellitus with CKD, without long-term current use of insulin [facility]) Education: Reviewed 'ABCs' of diabetes management  Discussed goals to be met and/or maintained include A1C (<7) Blood pressure (<130/80) Cholesterol (LDL <70) Continue Eye Exam yearly  Continue Dental Exam Q6 mo Discussed dietary recommendations Discussed Physical Activity recommendations Foot exam UTD Check A1C   Mixed hyperlipidemia associated with T2DM ([place]) Discussed lifestyle modifications. Recommended diet heavy in fruits and veggies, omega 3's. Decrease consumption of animal meats, cheeses, and dairy products. Remain active and exercise as tolerated. Continue to monitor. Check lipids/TSH   Medication management All medications discussed and reviewed in full. All questions and concerns regarding medications addressed.     Vitamin D deficiency Continue supplement, check levels annually and PRN  Age related osteoporosis, unspecified pathological fracture presence Due for DEXA-order placed. No longer taking Fosamax (06/2017-1/201) d/t jaw pain SE Pursue a combination of weight-bearing exercises and strength training. Advised on fall prevention measures including proper lighting in all rooms, removal of area rugs and floor clutter, use of walking devices as deemed appropriate, avoidance of uneven walking  surfaces. Smoking cessation and moderate alcohol consumption if applicable Consume 633 to 1000 IU of vitamin D daily with a goal vitamin D serum value of 30 ng/mL or higher. Aim for 1000 to 1200 mg of elemental calcium daily through supplements and/or dietary sources.   Obesity Discussed appropriate BMI Goal of losing 1 lb per month. Diet modification. Physical activity. Encouraged/praised to build confidence.   Right-sided low back pain without sciatica, unspecified chronicity Ortho follows Continue exercise and weight loss  Gastroesophageal reflux disease, esophagitis presence not specified No suspected reflux complications ([place]/stricture). Lifestyle modification:  wt loss, avoid meals 2-3h before bedtime. Consider eliminating food triggers:  chocolate, caffeine, EtOH, acid/spicy food.   Uncomplicated asthma, unspecified asthma severity, unspecified whether persistent Continue Advair Monitor, doing well with current regimen   Hot flashes Has failed lexapro, paxil, effexor; GYN stopped estrogen; she is doing well with estroven complete. Weight loss encouraged   Boil, back Attempted to drain today, unable to obtain any discharge upon manual manipulation. Apply warm compress, after warm shower to allow for natural opening draining.   If noticed, contact office for abx tmt See attached photo.   Orders Placed This Encounter  Procedures   DG Bone Density    Standing Status:   Future    Standing Expiration Date:   07/29/2023    Order Specific Question:   Reason for Exam (SYMPTOM  OR DIAGNOSIS REQUIRED)    Answer:   osteoporosis screening    Order Specific Question:   Preferred imaging location?    Answer:   [facility]   No lab work completed today.  Review of 03/2022 CPE stable and WNL.  Will defer until next OV 10/2022  Over 40 minutes of exam, counseling, chart review, and critical decision making was performed  Future Appointments  Date Time Provider  [place]  11/03/2022  2:30 PM Unk Pinto, MD [facility] None  12/01/2022  9:00 AM Donato Heinz, MD [facility] None  04/22/2023  3:00 PM Alycia Rossetti, NP [facility] None  07/29/2023  4:00 PM Darrol Jump, NP [facility] None    Plan:   During the course of the visit the patient was educated and counseled about appropriate screening and preventive services including:   Pneumococcal vaccine  Prevnar 13 Influenza vaccine Td vaccine Screening electrocardiogram Bone densitometry screening Colorectal cancer screening Diabetes screening Glaucoma screening Nutrition counseling  Advanced directives: requested   Subjective:  Lauren Lopez is a 71 y.o. AA female who presents for AWV and 3 month follow up.  She is married, 2 grown daughter, 2 grandchildren live in [place], [place]. She is retired Scientist, forensic associated. Husband has leukemia, they are monitoring it, he is having dementia as well. She reports the dementia is getting worse over time.  She is trying to stay positive.  She has a tripped planned this weekend to get away.   She is s/p TAH, did have follow up with GYN. She does have  hot flashes, was on estrogen s/p hysterectomy but GYN tapered off; having sx day and night could not tolerate the lexapro, paxil, effexor, has been on estroven complete with benefit. Fairly well managed at this time.  She was following with ortho for her back pain, following with Dr. Ernestina Patches and has had nerve abation and injections with benefit more recently. . She had negative PTH. She had negative RA workup in 2017. Has seen chiropractic with benefit in the past but not recently due to cost. Also follows with ortho Dr. Wynelle Link for R knee pain getting injections, does need replacement at some point.   BMI is Body mass index is 33.09 kg/m., she has been working on diet and exercise Wt Readings from Last 3 Encounters:  07/28/22 186 lb 12.8 oz (84.7 kg)  07/26/22 186 lb (84.4  kg)  07/21/22 191 lb (86.6 kg)   Her blood pressure has been controlled at home, today their BP is BP: 118/64 She does workout.  She denies chest pain, shortness of breath, dizziness.   She has recently followed up with Dr. Elta Guadeloupe, Cardiology 07/21/22 for SVT.  He has suggested ablation.  Patient has recently undergone a sleep study 07/26/22 for possible sleep apnea.  She will need to have this confirmed before moving forward with ablation.  She is asymptomatic today and BP and HR are well controlled  She is on cholesterol medication (newly on rosuvastatin 10 mg daily) and denies myalgias. Her cholesterol is not at goal. The cholesterol last visit was:   Lab Results  Component Value Date   CHOL 180 04/21/2022   HDL 94 04/21/2022   LDLCALC 73 04/21/2022   TRIG 57 04/21/2022   CHOLHDL 1.9 04/21/2022   She has been working on diet and exercise for diabetes with CKD, controlled with diet and metformin, she has worked hard to get it down, and denies foot ulcerations, hyperglycemia, hypoglycemia , increased appetite, nausea, paresthesia of the feet, polydipsia, polyuria, vomiting and weight loss.  She takes metformin 500 mg BID, not TID Last A1C in the office was:  Lab Results  Component Value Date   HGBA1C 6.3 (H) 04/21/2022   Last GFR Lab Results  Component Value Date   GFRAA 80 04/03/2021   Patient is on Vitamin D supplement, 10000 IU daily  Lab Results  Component Value Date   VD25OH  36 04/21/2022   She continues on B12 supplement, taking 2 tabs daily, unsure of dose, hasn't changed dose Lab Results  Component Value Date   VITAMINB12 625 04/21/2022     Medication Review:  Current Outpatient Medications (Endocrine & Metabolic):    metFORMIN (GLUCOPHAGE) 500 MG tablet, Take 1 tab twice daily with food for diabetes. Do not increase dose due to causing diarrhea.  Current Outpatient Medications (Cardiovascular):    diltiazem (CARDIZEM CD) 300 MG 24 hr capsule, Take 1 capsule (300  mg total) by mouth daily.   EPINEPHrine 0.3 mg/0.3 mL IJ SOAJ injection,    rosuvastatin (CRESTOR) 20 MG tablet, Take  1 tablet  Daily  for Cholesterol  Current Outpatient Medications (Respiratory):    fexofenadine (ALLEGRA) 180 MG tablet, Take 1 tablet (180 mg total) by mouth daily.   mometasone (NASONEX) 50 MCG/ACT nasal spray, Place 2 sprays into the nose daily as needed (allergies).   ADVAIR HFA 073-71 MCG/ACT inhaler, Inhale 2 puffs into the lungs 2 (two) times daily.   albuterol (VENTOLIN HFA) 108 (90 Base) MCG/ACT inhaler, Inhale 1-2 puffs into the lungs every 6 (six) hours as needed for wheezing or shortness of breath. (Patient not taking: Reported on 07/28/2022)    Current Outpatient Medications (Other):    atropine 1 % ophthalmic solution, Place 1 drop into the left eye daily.   blood glucose meter kit and supplies, Test sugars twice daily. Dx E11.9   Lancets (ONETOUCH DELICA PLUS GGYIRS85I) MISC, TEST SUGAR TWICE A DAY   latanoprost (XALATAN) 0.005 % ophthalmic solution, Place 1 drop into both eyes at bedtime.   Magnesium 250 MG TABS, Take 250 mg by mouth in the morning and at bedtime.   Misc Natural Products (NEURIVA PO), Take by mouth.   omeprazole (PRILOSEC) 40 MG capsule, Take one capsule daily for indigestion and heartburn   ONETOUCH ULTRA test strip, USE TO TEST SUGAR TWICE A DAY   Potassium 99 MG TABS, Take by mouth. TID   Rhubarb (ESTROVEN MENOPAUSE RELIEF PO), Take 1 capsule by mouth daily.   Vitamin D, Ergocalciferol, (DRISDOL) 1.25 MG (50000 UNIT) CAPS capsule, Take 50,000 Units by mouth every 7 (seven) days.   methocarbamol (ROBAXIN) 500 MG tablet, Take 1 tablet (500 mg total) by mouth every 6 (six) hours as needed for muscle spasms. (Patient not taking: Reported on 07/28/2022)  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 Derivative] Other (See Comments)    + PPD 2013 with NEG CXR  05/2012 Redness and severe swelling at injection site    Current Problems (verified) has Essential hypertension; GERD (gastroesophageal reflux disease); Vitamin D deficiency; Asthma; Medication management; Osteoporosis; Obesity (BMI 30.0-34.9); Hyperlipidemia associated with type 2 diabetes mellitus ([place]); CKD stage 2 due to type 2 diabetes mellitus ([place]); Type 2 diabetes mellitus with stage 2 chronic kidney disease, without long-term current use of insulin ([place]); Lipoma of right upper extremity; Allergic rhinitis due to pollen; S/P total knee arthroplasty, right; Glaucoma; Prolonged QT interval; S/P knee replacement; SVT (supraventricular tachycardia) ([place]); 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 02/20/2022; and Hepatic steatosis on their problem list.  Screening Tests Immunization History  Administered Date(s) Administered   Influenza, High Dose Seasonal PF 11/10/2017, 09/15/2018, 09/04/2019, 04/26/2020, 10/14/2021   Influenza, Seasonal, Injecte, Preservative Fre 10/17/2015   Influenza,inj,quad, With Preservative 10/15/2016   Influenza-Unspecified 08/30/2020   PFIZER(Purple Top)SARS-COV-2 Vaccination 12/22/2019, 01/12/2020, 04/26/2020, 04/01/2021  Pneumococcal Conjugate-13 12/22/2018   Pneumococcal Polysaccharide-23 02/04/2017, 04/25/2021   Pneumococcal-Unspecified 06/27/2013   Tdap 04/23/2012   Zoster, Live 07/31/2015    Preventative care: Last colonoscopy: 2019 had 6 polyps, 5 years Dr. Earlean Shawl  Pap: Hysterectomy, DONE Mammogram:  08/24/22 DEXA: 07/2019 osteopenia, at [facility] started fosamax 06/2017 but stopped due to jaw pain however doesn't believe this was r/t fosamax, has DEXA order in   Tetanus: 2013 Influenza: Due 2023 Pneumonia: 2/2, done Shingles - zoster, 2016 - check about shingrix  Covid 19: 2/2, 2021, pfizer + 2 boosters  Names of Other Physician/Practitioners you currently use: 1. [facility] here for primary care 2.  Dr. Katy Fitch, eye doctor, last visit 2022, report requested 3. Dr. Mariea Clonts  , dentist, last visit 2022, goes q2m Patient Care Team: MUnk Pinto MD as PCP - General (Internal Medicine) SDonato Heinz MD as PCP - Cardiology (Cardiology) Sypher, RHerbie Baltimore MD (Inactive) as Consulting Physician (Orthopedic Surgery) HMonna Fam MD as Consulting Physician (Ophthalmology) MRichmond Campbell MD as Consulting Physician (Gastroenterology) MCheri Fowler MD as Consulting Physician (Obstetrics and Gynecology) AGaynelle Arabian MD as Consulting Physician (Orthopedic Surgery) LMarchia Bond MD as Consulting Physician (Orthopedic Surgery) SMosetta Anis MD as Referring Physician (Allergy) MLarey Dresser MD as Consulting Physician (Cardiology)  Surgical: She  has a past surgical history that includes Bilateral salpingoophorectomy (11/28/2010); Knee arthroscopy (Bilateral, 2005-2016); Colonoscopy; Carpal tunnel release (07/15/2012); Shoulder arthroscopy with rotator cuff repair and subacromial decompression (Right, 01/20/2013); Abdominal hysterectomy; Carpal tunnel release (Right, 05/31/2014); Tubal ligation; and Total knee arthroplasty (Right, 02/02/2022). Family Her family history includes Alzheimer's disease in her father; COPD in her brother; Cirrhosis in her brother; Dementia in her mother; Diabetes in her mother; Hypertension in her brother, brother, father, and mother; Tongue cancer in her sister. Social history  She reports that she has never smoked. She has never used smokeless tobacco. She reports that she does not drink alcohol and does not use drugs.   MEDICARE WELLNESS OBJECTIVES: Physical activity:   Cardiac risk factors:   Depression/mood screen:      07/28/2022    4:03 PM  Depression screen PHQ 2/9  Decreased Interest 0  Down, Depressed, Hopeless 0  PHQ - 2 Score 0    ADLs:     07/28/2022    3:58 PM 02/02/2022   10:14 AM  In your present state of health, do you have any  difficulty performing the following activities:  Hearing? 0   Vision? 0   Difficulty concentrating or making decisions? 0   Walking or climbing stairs? 0   Dressing or bathing? 0   Doing errands, shopping? 0 0  Preparing Food and eating ? N   Using the Toilet? N   In the past six months, have you accidently leaked urine? N   Do you have problems with loss of bowel control? N   Managing your Finances? N   Housekeeping or managing your Housekeeping? N      Cognitive Testing  Alert? Yes  Normal Appearance?Yes  Oriented to person? Yes  Place? Yes   Time? Yes  Recall of three objects?  Yes  Can perform simple calculations? Yes  Displays appropriate judgment?Yes  Can read the correct time from a watch face?Yes  EOL planning:       Objective:   Today's Vitals   07/28/22 1510  BP: 118/64  Pulse: 79  Temp: (!) 97.5 F (36.4 C)  SpO2: 97%  Weight: 186 lb 12.8  oz (84.7 kg)  Height: _0  (1.6 m)     Body mass index is 33.09 kg/m.  General appearance: alert, no distress, WD/WN, female HEENT: normocephalic, sclerae anicteric, TMs pearly, nares patent, no discharge or erythema, pharynx normal Oral cavity: MMM, no lesions Neck: supple, no lymphadenopathy, no thyromegaly, no masses Heart: RRR, normal S1, S2, no murmurs Lungs: CTA bilaterally, no wheezes, rhonchi, or rales Abdomen: +bs, soft, non tender, non distended, no masses, no hepatomegaly, no splenomegaly Musculoskeletal: nontender, no swelling, no obvious deformity, normal gait Extremities: no edema, no cyanosis, no clubbing. She has approx 8 cm rubbery soft tissue mass to post R shoulder, clear borders.  Pulses: 2+ symmetric, upper and lower extremities, normal cap refill Neurological: alert, oriented x 3, CN2-12 intact, strength normal upper extremities and lower extremities, sensation normal throughout, DTRs 2+ throughout, no cerebellar signs, gait normal Psychiatric: normal affect, behavior normal, pleasant  Skin:  warm, dry, intact; no rash or concerning lesions, has several skin tags to neck.       Medicare Attestation I have personally reviewed: The patient's medical and social history Their use of alcohol, tobacco or illicit drugs Their current medications and supplements The patient's functional ability including ADLs,fall risks, home safety risks, cognitive, and hearing and visual impairment Diet and physical activities Evidence for depression or mood disorders  The patient's weight, height, BMI, and visual acuity have been recorded in the chart.  I have made referrals, counseling, and provided education to the patient based on review of the above and I have provided the patient with a written personalized care plan for preventive services.      Darrol Jump, NP   07/28/2022

## 2022-07-29 DIAGNOSIS — J301 Allergic rhinitis due to pollen: Secondary | ICD-10-CM | POA: Diagnosis not present

## 2022-07-29 DIAGNOSIS — J3081 Allergic rhinitis due to animal (cat) (dog) hair and dander: Secondary | ICD-10-CM | POA: Diagnosis not present

## 2022-07-29 DIAGNOSIS — J3089 Other allergic rhinitis: Secondary | ICD-10-CM | POA: Diagnosis not present

## 2022-07-29 NOTE — Procedures (Signed)
   Patient Name: Lauren Lopez, Lauren Lopez Date:07/26/2022 Gender: Female D.O.B: 27-Jul-1951 Age (years): 17 Referring Provider: Oswaldo Milian Height (inches): 43 Interpreting Physician: Fransico Him MD, ABSM Weight (lbs): 186 RPSGT: Heugly, [place] BMI: 33 MRN: [id number] Neck Size: 13.75  CLINICAL INFORMATION Sleep Study Type: NPSG  Indication for sleep study: Daytime Fatigue, Diabetes, Fatigue, Hypertension, Restless Sleep with Limb Movments  Epworth Sleepiness Score: 4  SLEEP STUDY TECHNIQUE As per the AASM Manual for the Scoring of Sleep and Associated Events v2.3 (April 2016) with a hypopnea requiring 4% desaturations.  The channels recorded and monitored were frontal, central and occipital EEG, electrooculogram (EOG), submentalis EMG (chin), nasal and oral airflow, thoracic and abdominal wall motion, anterior tibialis EMG, snore microphone, electrocardiogram, and pulse oximetry.  MEDICATIONS Medications self-administered by patient taken the night of the study : N/A  SLEEP ARCHITECTURE The study was initiated at 11:11:06 PM and ended at 5:20:21 AM.  Sleep onset time was 12.4 minutes and the sleep efficiency was 84.9%. The total sleep time was 313.5 minutes.  Stage REM latency was 119.5 minutes.  The patient spent 11.32% of the night in stage N1 sleep, 67.30% in stage N2 sleep, 8.13% in stage N3 and 13.2% in REM.  Alpha intrusion was absent.  Supine sleep was 29.35%.  RESPIRATORY PARAMETERS The overall apnea/hypopnea index (AHI) was 0.2 per hour. There were 0 total apneas, including 0 obstructive, 0 central and 0 mixed apneas. There were 1 hypopneas and 0 RERAs.  The AHI during Stage REM sleep was 1.4 per hour.  AHI while supine was 0.0 per hour.  The mean oxygen saturation was 94.38%. The minimum SpO2 during sleep was 89.00%.  soft snoring was noted during this study.  CARDIAC DATA The 2 lead EKG demonstrated sinus rhythm. The mean heart rate was 76.75  beats per minute. Other EKG findings include: PVCs and intermittent type 1 second degree AV block.  LEG MOVEMENT DATA The total PLMS were 351 with a resulting PLMS index of 67.18. Associated arousal with leg movement index was 11.7 .  IMPRESSIONS - No significant obstructive sleep apnea occurred during this study (AHI = 0.2/h). - The patient had minimal or no oxygen desaturation during the study (Min O2 = 89.00%) - The patient snored with soft snoring volume. - EKG findings include PVCs and intermittent type 1 second degree AV blcok - Severe periodic limb movements of sleep occurred during the study. Associated arousals were significant.  DIAGNOSIS - Periodic Limb Movement Syndrome (G47.61) - No evidence of Sleep disordered breathing. 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 - Sleep hygiene should be reviewed to assess factors that may improve sleep quality. - Weight management and regular exercise should be initiated or continued if appropriate. - The patient should be evaluated for possible Restless Legs syndrome  [Electronically signed] 07/29/2022 03:38 PM  Fransico Him MD, ABSM Diplomate, American Board of Sleep Medicine

## 2022-07-30 ENCOUNTER — Telehealth: Payer: Self-pay | Admitting: *Deleted

## 2022-07-30 NOTE — Telephone Encounter (Signed)
The patient has been notified of the result and verbalized understanding.  All questions (if any) were answered. Lauren Lopez, [place] 07/30/2022 6:06 PM    Pt is aware and agreeable to results. Patient will speak to her provider her about her restless legs. Patient states she already takes medicine for her restless leg syndrome.

## 2022-07-30 NOTE — Telephone Encounter (Signed)
-----   Message from Freada Bergeron, [place] sent at 07/30/2022  6:03 PM EDT ----- CC'd Chart Routing History  Routing History  From: Sueanne Margarita, MD On: 07/29/2022 03:40 PM To: [facility] Sleep Studies (Pool) Priority: Routine Routing Comments: No sleep apnea but has possible restless leg syndrome that should be evaluated further

## 2022-08-04 ENCOUNTER — Telehealth: Payer: Self-pay | Admitting: Internal Medicine

## 2022-08-04 DIAGNOSIS — H20012 Primary iridocyclitis, left eye: Secondary | ICD-10-CM | POA: Diagnosis not present

## 2022-08-04 DIAGNOSIS — H43823 Vitreomacular adhesion, bilateral: Secondary | ICD-10-CM | POA: Diagnosis not present

## 2022-08-04 DIAGNOSIS — E119 Type 2 diabetes mellitus without complications: Secondary | ICD-10-CM | POA: Diagnosis not present

## 2022-08-04 DIAGNOSIS — I471 Supraventricular tachycardia: Secondary | ICD-10-CM

## 2022-08-04 DIAGNOSIS — H401132 Primary open-angle glaucoma, bilateral, moderate stage: Secondary | ICD-10-CM | POA: Diagnosis not present

## 2022-08-04 DIAGNOSIS — Z961 Presence of intraocular lens: Secondary | ICD-10-CM | POA: Diagnosis not present

## 2022-08-04 NOTE — Telephone Encounter (Signed)
Patient called stating she was suppose to schedule a procedure with Dr. Lovena Le. She would like to schedule for 08/17/22.

## 2022-08-05 NOTE — Telephone Encounter (Signed)
Outreach made to Pt.  Pt scheduled for SVT ablation on August 17, 2022   Will get lab work August 06, 2022 and discuss instruction letter  Work up complete.

## 2022-08-06 ENCOUNTER — Encounter: Payer: Self-pay | Admitting: Internal Medicine

## 2022-08-06 ENCOUNTER — Ambulatory Visit: Payer: Medicare HMO | Attending: Internal Medicine

## 2022-08-06 DIAGNOSIS — J3081 Allergic rhinitis due to animal (cat) (dog) hair and dander: Secondary | ICD-10-CM | POA: Diagnosis not present

## 2022-08-06 DIAGNOSIS — J301 Allergic rhinitis due to pollen: Secondary | ICD-10-CM | POA: Diagnosis not present

## 2022-08-06 DIAGNOSIS — I471 Supraventricular tachycardia: Secondary | ICD-10-CM | POA: Diagnosis not present

## 2022-08-06 DIAGNOSIS — J3089 Other allergic rhinitis: Secondary | ICD-10-CM | POA: Diagnosis not present

## 2022-08-06 LAB — BASIC METABOLIC PANEL
BUN/Creatinine Ratio: 18 (ref 12–28)
BUN: 14 mg/dL (ref 8–27)
CO2: 27 mmol/L (ref 20–29)
Calcium: 10.5 mg/dL — ABNORMAL HIGH (ref 8.7–10.3)
Chloride: 104 mmol/L (ref 96–106)
Creatinine, Ser: 0.79 mg/dL (ref 0.57–1.00)
Glucose: 95 mg/dL (ref 70–99)
Potassium: 4.6 mmol/L (ref 3.5–5.2)
Sodium: 139 mmol/L (ref 134–144)
eGFR: 80 mL/min/{1.73_m2} (ref >59)

## 2022-08-06 LAB — CBC WITH DIFFERENTIAL/PLATELET
Basophils Absolute: 0 10*3/uL (ref 0.0–0.2)
Basos: 0 %
EOS (ABSOLUTE): 0.1 10*3/uL (ref 0.0–0.4)
Eos: 2 %
Hematocrit: 37.1 % (ref 34.0–46.6)
Hemoglobin: 11.9 g/dL (ref 11.1–15.9)
Lymphocytes Absolute: 2.8 10*3/uL (ref 0.7–3.1)
Lymphs: 43 %
MCH: 26.2 pg — ABNORMAL LOW (ref 26.6–33.0)
MCHC: 32.1 g/dL (ref 31.5–35.7)
MCV: 82 fL (ref 79–97)
Monocytes Absolute: 0.4 10*3/uL (ref 0.1–0.9)
Monocytes: 6 %
Neutrophils Absolute: 3.3 10*3/uL (ref 1.4–7.0)
Neutrophils: 49 %
Platelets: 313 10*3/uL (ref 150–450)
RBC: 4.54 x10E6/uL (ref 3.77–5.28)
RDW: 17 % — ABNORMAL HIGH (ref 11.7–15.4)
WBC: 6.6 10*3/uL (ref 3.4–10.8)

## 2022-08-07 DIAGNOSIS — Z961 Presence of intraocular lens: Secondary | ICD-10-CM | POA: Diagnosis not present

## 2022-08-07 DIAGNOSIS — H43823 Vitreomacular adhesion, bilateral: Secondary | ICD-10-CM | POA: Diagnosis not present

## 2022-08-07 DIAGNOSIS — E119 Type 2 diabetes mellitus without complications: Secondary | ICD-10-CM | POA: Diagnosis not present

## 2022-08-07 DIAGNOSIS — H20012 Primary iridocyclitis, left eye: Secondary | ICD-10-CM | POA: Diagnosis not present

## 2022-08-07 DIAGNOSIS — H401132 Primary open-angle glaucoma, bilateral, moderate stage: Secondary | ICD-10-CM | POA: Diagnosis not present

## 2022-08-12 DIAGNOSIS — J3081 Allergic rhinitis due to animal (cat) (dog) hair and dander: Secondary | ICD-10-CM | POA: Diagnosis not present

## 2022-08-12 DIAGNOSIS — J3089 Other allergic rhinitis: Secondary | ICD-10-CM | POA: Diagnosis not present

## 2022-08-12 DIAGNOSIS — J301 Allergic rhinitis due to pollen: Secondary | ICD-10-CM | POA: Diagnosis not present

## 2022-08-14 NOTE — Pre-Procedure Instructions (Signed)
Instructed patient on the following items: Arrival time 0930 Nothing to eat or drink after midnight No meds AM of procedure Responsible person to drive you home and stay with you for 24 hrs      

## 2022-08-17 ENCOUNTER — Ambulatory Visit (HOSPITAL_COMMUNITY)
Admission: RE | Admit: 2022-08-17 | Discharge: 2022-08-17 | Disposition: A | Discharge status: Home / Self Care | Payer: Medicare HMO | Attending: Internal Medicine | Admitting: Internal Medicine

## 2022-08-17 ENCOUNTER — Other Ambulatory Visit: Payer: Self-pay

## 2022-08-17 ENCOUNTER — Encounter (HOSPITAL_COMMUNITY)
Admission: RE | Disposition: A | Discharge status: Home / Self Care | Payer: Self-pay | Source: Home / Self Care | Attending: Internal Medicine

## 2022-08-17 DIAGNOSIS — I10 Essential (primary) hypertension: Secondary | ICD-10-CM | POA: Diagnosis not present

## 2022-08-17 DIAGNOSIS — I471 Supraventricular tachycardia: Secondary | ICD-10-CM | POA: Insufficient documentation

## 2022-08-17 DIAGNOSIS — Z79899 Other long term (current) drug therapy: Secondary | ICD-10-CM | POA: Insufficient documentation

## 2022-08-17 HISTORY — PX: SVT ABLATION: EP1225

## 2022-08-17 LAB — GLUCOSE, CAPILLARY
Glucose-Capillary: 124 mg/dL — ABNORMAL HIGH (ref 70–99)
Glucose-Capillary: 74 mg/dL (ref 70–99)

## 2022-08-17 SURGERY — SVT ABLATION

## 2022-08-17 MED ORDER — SODIUM CHLORIDE 0.9 % IV SOLN: INTRAVENOUS | Status: DC

## 2022-08-17 MED ORDER — SODIUM CHLORIDE 0.9 % IV SOLN: 250.0000 mL | INTRAVENOUS | Status: DC | PRN

## 2022-08-17 MED ORDER — SODIUM CHLORIDE 0.9% FLUSH: 3.0000 mL | INTRAVENOUS | Status: DC | PRN

## 2022-08-17 MED ORDER — FENTANYL CITRATE (PF) 100 MCG/2ML IJ SOLN
INTRAMUSCULAR | Status: AC
  Filled 2022-08-17: qty 2

## 2022-08-17 MED ORDER — BUPIVACAINE HCL (PF) 0.25 % IJ SOLN
INTRAMUSCULAR | Status: AC
  Filled 2022-08-17: qty 30

## 2022-08-17 MED ORDER — MIDAZOLAM HCL 5 MG/5ML IJ SOLN
INTRAMUSCULAR | Status: DC | PRN
  Administered 2022-08-17 (×2): 1 mg via INTRAVENOUS

## 2022-08-17 MED ORDER — HEPARIN (PORCINE) IN NACL 1000-0.9 UT/500ML-% IV SOLN
INTRAVENOUS | Status: AC
  Filled 2022-08-17: qty 500

## 2022-08-17 MED ORDER — LIDOCAINE HCL 1 % IJ SOLN
INTRAMUSCULAR | Status: AC
  Filled 2022-08-17: qty 40

## 2022-08-17 MED ORDER — MIDAZOLAM HCL 5 MG/5ML IJ SOLN
INTRAMUSCULAR | Status: AC
  Filled 2022-08-17: qty 5

## 2022-08-17 MED ORDER — BUPIVACAINE HCL (PF) 0.25 % IJ SOLN
INTRAMUSCULAR | Status: DC | PRN
  Administered 2022-08-17: 30 mL

## 2022-08-17 MED ORDER — ACETAMINOPHEN 325 MG PO TABS: 650.0000 mg | ORAL_TABLET | ORAL | Status: DC | PRN

## 2022-08-17 MED ORDER — FENTANYL CITRATE (PF) 100 MCG/2ML IJ SOLN
INTRAMUSCULAR | Status: DC | PRN
  Administered 2022-08-17 (×2): 12.5 ug via INTRAVENOUS

## 2022-08-17 MED ORDER — HEPARIN (PORCINE) IN NACL 1000-0.9 UT/500ML-% IV SOLN
INTRAVENOUS | Status: DC | PRN
  Administered 2022-08-17: 500 mL

## 2022-08-17 SURGICAL SUPPLY — 10 items
CATH EZ STEER NAV 4MM D-F CUR (ABLATOR) IMPLANT
CATH JOSEPH QUAD ALLRED 6F REP (CATHETERS) IMPLANT
CATH POLARIS X REPROCESSED (CATHETERS) IMPLANT
PACK EP LATEX FREE (CUSTOM PROCEDURE TRAY) ×1
PACK EP LF (CUSTOM PROCEDURE TRAY) ×1 IMPLANT
PAD DEFIB RADIO PHYSIO CONN (PAD) ×1 IMPLANT
PATCH CARTO3 (PAD) IMPLANT
SHEATH PINNACLE 6F 10CM (SHEATH) IMPLANT
SHEATH PINNACLE 7F 10CM (SHEATH) IMPLANT
SHEATH PINNACLE 8F 10CM (SHEATH) IMPLANT

## 2022-08-17 NOTE — Progress Notes (Signed)
Site area: right groin a 6,7, and 8 french venous sheath was removed  Site Prior to Removal:  Level 0  Pressure Applied For 20 MINUTES    Bedrest Beginning at 1400p X 6 hours  Manual:   Yes.    Patient Status During Pull:  stable  Post Pull Groin Site:  Level 0  Post Pull Instructions Given:  Yes.    Post Pull Pulses Present:  Yes.    Dressing Applied:  Yes.    Comments:

## 2022-08-17 NOTE — Discharge Instructions (Signed)

## 2022-08-17 NOTE — Progress Notes (Signed)
Patient and family was given discharge instructions. All verbalized understanding.

## 2022-08-17 NOTE — Interval H&P Note (Signed)
History and Physical Interval Note:  08/17/2022 12:14 PM  Lauren Lopez  has presented today for surgery, with the diagnosis of svt.  The various methods of treatment have been discussed with the patient and family. After consideration of risks, benefits and other options for treatment, the patient has consented to  Procedure(s): SVT ABLATION (N/A) as a surgical intervention.  The patient's history has been reviewed, patient examined, no change in status, stable for surgery.  I have reviewed the patient's chart and labs.  Questions were answered to the patient's satisfaction.     Cristopher Peru

## 2022-08-18 ENCOUNTER — Encounter (HOSPITAL_COMMUNITY): Payer: Self-pay | Admitting: Internal Medicine

## 2022-08-22 ENCOUNTER — Telehealth: Payer: Self-pay | Admitting: Student in an Organized Health Care Education/Training Program

## 2022-08-22 NOTE — Telephone Encounter (Signed)
Patient called to inform me that she has noticed swelling on the L side of her neck and behind her ear. The swelling is not painful nor warm to the touch. She denies chest pain and SOB. She is wondering if this swelling could be related to her recent SVT ablation. I reassured her that the swelling is unlikely to be 2/2 her recent ablation. I instructed her to call her PCP about the swelling on Monday if it persists, but doesn't worsen. I further instructed her to go to urgent care tomorrow if the swelling worsens and/or she develops increased warmth or fever. She verbalized an understanding.

## 2022-08-24 ENCOUNTER — Telehealth: Payer: Self-pay | Admitting: Nurse Practitioner

## 2022-08-24 DIAGNOSIS — E1122 Type 2 diabetes mellitus with diabetic chronic kidney disease: Secondary | ICD-10-CM

## 2022-08-24 DIAGNOSIS — E1169 Type 2 diabetes mellitus with other specified complication: Secondary | ICD-10-CM

## 2022-08-24 MED ORDER — METFORMIN HCL 500 MG PO TABS: ORAL_TABLET | ORAL | 3 refills | Status: DC

## 2022-08-24 MED ORDER — MOMETASONE FUROATE 50 MCG/ACT NA SUSP: 2.0000 | Freq: Every day | NASAL | 2 refills | Status: DC

## 2022-08-24 MED ORDER — OMEPRAZOLE 40 MG PO CPDR: DELAYED_RELEASE_CAPSULE | ORAL | 3 refills | Status: DC

## 2022-08-24 MED ORDER — ROSUVASTATIN CALCIUM 20 MG PO TABS: ORAL_TABLET | ORAL | 3 refills | Status: DC

## 2022-08-24 NOTE — Addendum Note (Signed)
Addended by: Chancy Hurter on: 08/24/2022 10:35 AM   Modules accepted: Orders

## 2022-08-24 NOTE — Telephone Encounter (Signed)
Patient is requesting refills on.Marland KitchenMarland KitchenOmeprazole, Metformin, Rosuvastatin, and Mometasone. [facility] [place]

## 2022-08-25 ENCOUNTER — Telehealth: Payer: Self-pay | Admitting: Internal Medicine

## 2022-08-25 DIAGNOSIS — J301 Allergic rhinitis due to pollen: Secondary | ICD-10-CM | POA: Diagnosis not present

## 2022-08-25 DIAGNOSIS — J3081 Allergic rhinitis due to animal (cat) (dog) hair and dander: Secondary | ICD-10-CM | POA: Diagnosis not present

## 2022-08-25 DIAGNOSIS — J3089 Other allergic rhinitis: Secondary | ICD-10-CM | POA: Diagnosis not present

## 2022-08-25 NOTE — Telephone Encounter (Signed)
STAT if HR is under 50 or over 120 (normal HR is 60-100 beats per minute)  What is your heart rate? 102  Do you have a log of your heart rate readings (document readings)? 108 108   Do you have any other symptoms? Pt states she is sweating more than normal    Pt calling stating she was looking over her discharge papers and she wasn't sure if her hr is within normal range or not. Pt also states she has noticed she's been sweating

## 2022-08-25 NOTE — Telephone Encounter (Signed)
Pt called back regarding second telephone call received in [place] triage, at 424pm.   Pt advised and educated that her heart rate being mildly elevated at 98 to 107 s/p ablation is ok.  She states she is asymptomatic, but concerned her heart is beating too fast.   Pt has history of SVT, but since ablation has not had any SVT.  Pt educated on normal HR range of 60-100 BPM.  Pt ambulating fine at home.    Pt then shares she had mild left ear / jaw line edema for the past two days?  Pt asked questions about the facial edema, being concerned of patients airway.  Pt states her tongue, throat, mouth is WNL and is able to breath fine, that it is her "glands" and ear.    Pt advised to watch this symptom overnight, and if the swelling gets worse, go to the nearest ER.  If the edema stays the same, to call her PCP first thing in the morning, and get into see her PCP for assessment / care.  Pt understood my POC suggestion, and will do in the morning.

## 2022-08-25 NOTE — Telephone Encounter (Signed)
Patient reports elevated heart rate that started today. She states that it has been going up and down all morning, as high as 108. She states that she feels fine. Denies any palpitations or new/worsening shortness of breath. No dizziness or lightheadedness. She reports that swelling that she had called about over the weekend has resolved. Advised her to continue to monitor things and let us know if she becomes symptomatic.

## 2022-08-25 NOTE — Telephone Encounter (Signed)
Patient called back to get further information on how to address her symptoms.

## 2022-08-26 NOTE — Progress Notes (Addendum)
Future Appointments  Date Time Provider Department  08/27/2022 11:30 AM Unk Pinto, MD [facility]  09/18/2022 11:15 AM Evans Lance, MD [facility]  11/03/2022                               6 mo ov  2:30 PM Unk Pinto, MD [facility]  12/01/2022  9:00 AM Donato Heinz, MD [facility]  04/22/2023                              cpe  3:00 PM Alycia Rossetti, NP [facility]  07/29/2023                              wellness  4:00 PM Darrol Jump, NP [facility]    History of Present Illness:        This very nice 71 y.o. DBF with HTN, HLD, Pre-Diabetes , GERD and Vitamin D Deficiency presents with concerns of 5 day hx/o "swelling" of the Left side of her face  which actually is better  over the last 24 hour   Medications  Current Outpatient Medications (Endocrine & Metabolic):    dexamethasone (DECADRON) 4 MG tablet, Take 1 tab 3 x /day for 2 days,      then 2 x /day for 2  Days,     then 1 tab daily   metFORMIN (GLUCOPHAGE) 500 MG tablet, Take 1 tab twice daily with food for diabetes. Do not increase dose due to causing diarrhea.  Current Outpatient Medications (Cardiovascular):    diltiazem (CARDIZEM CD) 300 MG 24 hr capsule, Take 1 capsule (300 mg total) by mouth daily.   EPINEPHrine 0.3 mg/0.3 mL IJ SOAJ injection, Inject 0.3 mg into the muscle as needed for anaphylaxis.   rosuvastatin (CRESTOR) 20 MG tablet, Take  1 tablet  Daily  for Cholesterol  Current Outpatient Medications (Respiratory):    ADVAIR HFA 828-00 MCG/ACT inhaler, Inhale 2 puffs into the lungs 2 (two) times daily.   fexofenadine (ALLEGRA) 180 MG tablet, Take 1 tablet (180 mg total) by mouth daily.   mometasone (NASONEX) 50 MCG/ACT nasal spray, Place 2 sprays into the nose daily.    Current Outpatient Medications (Other):    blood glucose meter kit and supplies, Test sugars twice daily. Dx E11.9   cephALEXin (KEFLEX) 500 MG capsule, Take  1 capsule  3 x /day  after Meals for Infection    Cholecalciferol (VITAMIN D3) 250 MCG (10000 UT) capsule, Take 10,000 Units by mouth daily.   docusate sodium (COLACE) 100 MG capsule, Take 100 mg by mouth daily.   Lancets (ONETOUCH DELICA PLUS [id number]) MISC, TEST SUGAR TWICE A DAY   latanoprost (XALATAN) 0.005 % ophthalmic solution, Place 1 drop into both eyes at bedtime.   Magnesium 250 MG TABS, Take 250 mg by mouth in the morning and at bedtime.   Misc Natural Products (NEURIVA PO), Take 1 tablet by mouth daily.   omeprazole (PRILOSEC) 40 MG capsule, Take one capsule daily for indigestion and heartburn   ONETOUCH ULTRA test strip, USE TO TEST SUGAR TWICE A DAY   prednisoLONE acetate (PRED FORTE) 1 % ophthalmic suspension, Place 1 drop into the left eye. Instill 1 drop into the left eye 4 times daily for 1 week, 3 times daily for 1 week, 2 times daily for 1  week, 1 time daily until seen by provider   Rhubarb (ESTROVEN MENOPAUSE RELIEF PO), Take 1 capsule by mouth daily.  Problem list She has Essential hypertension; GERD (gastroesophageal reflux disease); Vitamin D deficiency; Asthma; Medication management; Osteoporosis; Obesity (BMI 30.0-34.9); Hyperlipidemia associated with type 2 diabetes mellitus ([place]); CKD stage 2 due to type 2 diabetes mellitus ([place]); Type 2 diabetes mellitus with stage 2 chronic kidney disease, without long-term current use of insulin ([place]); Lipoma of right upper extremity; Allergic rhinitis due to pollen; S/P total knee arthroplasty, right; Glaucoma; Prolonged QT interval; S/P knee replacement; SVT (supraventricular tachycardia) ([place]); 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 02/20/2022; and Hepatic steatosis on their problem list.   Observations/Objective:  BP (!) 144/70   Pulse 74   Temp 97.9 F (36.6 C)   Resp 16   Ht '5\' 3"'  (1.6 m)   Wt 189 lb 6.4 oz (85.9 kg)   SpO2 98%   BMI 33.55 kg/m   HEENT - WNL  except slight puffiness of the Left parotid gland and slight tenderness. Neck - supple.  Chest -  Clear equal BS. Cor - Nl HS. RRR w/o sig MGR. PP 1(+). No edema. MS- FROM w/o deformities.  Gait Nl. Neuro -  Nl w/o focal abnormalities.  Assessment and Plan:  1. Parotiditis  - cephALEXin (KEFLEX) 500 MG capsule;  Take  1 capsule  3 x /day  after Meals for Infection   Dispense: 30 capsules  - dexamethasone (DECADRON) 4 MG tablet;  Take 1 tab 3 x /day for 2 days, then 2 x /day for 2  Days, then 1 tab daily   Dispense: 13 tablet   Follow Up Instructions:        I discussed the assessment and treatment plan with the patient. The patient was provided an opportunity to ask questions and all were answered. The patient agreed with the plan and demonstrated an understanding of the instructions.       The patient was advised to call back or seek an in-person evaluation if the symptoms worsen or if the condition fails to improve as anticipated.    Kirtland Bouchard, MD

## 2022-08-27 ENCOUNTER — Encounter: Payer: Self-pay | Admitting: Internal Medicine

## 2022-08-27 ENCOUNTER — Ambulatory Visit (INDEPENDENT_AMBULATORY_CARE_PROVIDER_SITE_OTHER): Payer: Medicare HMO | Admitting: Internal Medicine

## 2022-08-27 VITALS — BP 144/70 | HR 74 | Temp 97.9°F | Resp 16 | Ht 63.0 in | Wt 189.4 lb

## 2022-08-27 DIAGNOSIS — K1120 Sialoadenitis, unspecified: Secondary | ICD-10-CM

## 2022-08-27 MED ORDER — DEXAMETHASONE 4 MG PO TABS: ORAL_TABLET | ORAL | 0 refills | Status: DC

## 2022-08-27 MED ORDER — CEPHALEXIN 500 MG PO CAPS: ORAL_CAPSULE | ORAL | 0 refills | Status: DC

## 2022-08-27 NOTE — Patient Instructions (Signed)
Parotitis  Parotitis is inflammation of one or both of the parotid glands. These glands produce saliva. They are found on each side of the face, below and in front of the earlobes. The saliva that they produce comes out of tiny openings (ducts) inside the cheeks. Parotitis may cause sudden swelling and pain (acute parotitis). It can also cause repeated episodes of swelling and pain or continued swelling that may or may not be painful (chronic parotitis). What are the causes? This condition may be caused by: Infections from bacteria. Infections from viruses, such as mumps or HIV. Blockage (obstruction) of saliva flow through the parotid glands. This can be from a stone, scar tissue, or a tumor. Diseases that cause your body's defense system (immune system) to attack healthy cells in your salivary glands. These are called autoimmune diseases. What increases the risk? You are more likely to develop this condition if: You are 50 years old or older. You do not drink enough fluids (are dehydrated). You drink too much alcohol. You have: A dry mouth. Diabetes. Gout. A long-term illness. You do not take good care of your mouth and teeth (poor oral hygiene). You have had radiation treatments to the head and neck. You take certain medicines. What are the signs or symptoms? Symptoms of this condition depend on the cause. Symptoms may include: Swelling under and in front of the ear. This may get worse after eating. Pain and tenderness over the parotid gland. This may get worse after eating. Redness and warmth of the skin over the parotid gland. Fever or chills. Pus coming from the ducts inside the mouth. Dry mouth. A bad taste in the mouth. How is this diagnosed? This condition may be diagnosed based on: Your medical history. A physical exam. Tests to find the cause of the parotitis. These may include: Doing blood tests to check for an autoimmune disease or infections from a virus. Taking a  fluid sample from the parotid gland and testing it for infection. Injecting the ducts of the parotid gland with a dye and then taking X-rays (sialogram). Having other imaging tests of the gland, such as X-rays, ultrasound, MRI, or CT scan. Checking the opening of the gland for a stone or obstruction. Placing a needle into the gland to remove tissue for a biopsy (fine needle aspiration). How is this treated? Treatment for this condition depends on the cause. Treatment may include: Antibiotic medicine for a bacterial infection. NSAIDs, such as ibuprofen, to treat pain and swelling. Drinking more fluids. Removing a stone or obstruction. Treating an underlying disease that is causing parotitis. Surgery to drain an infection, remove a growth, or remove the whole gland (parotidectomy). Treatment may not be needed if parotid swelling goes away with home care. Follow these instructions at home: Medicines  Take over-the-counter and prescription medicines only as told by your health care provider. If you were prescribed an antibiotic medicine, take it as told by your health care provider. Do not stop taking the antibiotic even if you start to feel better. Managing pain and swelling If directed, apply heat to the affected area as often as told by your health care provider. Use the heat source that your health care provider recommends, such as a moist heat pack or a heating pad. Place a towel between your skin and the heat source. Leave the heat on for 20-30 minutes. Remove the heat if your skin turns bright red. This is especially important if you are unable to feel pain, heat, or cold.   You have a greater risk of getting burned. Gargle with a mixture of salt and water 3-4 times a day or as needed. To make salt water, completely dissolve -1 tsp (3-6 g) of salt in 1 cup (237 mL) of warm water. Gently massage the parotid glands as told by your health care provider. General instructions  Drink enough  fluid to keep your urine pale yellow. Keep your mouth clean and moist. Try sucking on sour candy. This may help to make your mouth less dry by stimulating the flow of saliva. Maintain good oral health. Brush your teeth at least two times a day. Floss your teeth every day. See your dentist regularly. Do not use any products that contain nicotine or tobacco. These products include cigarettes, chewing tobacco, and vaping devices, such as e-cigarettes. If you need help quitting, ask your health care provider. Do not drink alcohol. Keep all follow-up visits. This is important. Contact a health care provider if: You have a fever or chills. You have new symptoms. Your symptoms get worse. Your symptoms do not improve with treatment. Get help right away if: You have difficulty breathing or swallowing because of the swollen gland. These symptoms may represent a serious problem that is an emergency. Do not wait to see if the symptoms will go away. Get medical help right away. Call your local emergency services (911 in the [place] Do not drive yourself to the hospital. Summary Parotitis is inflammation of one or both of the parotid glands. Symptoms include pain and swelling under and in front of the ear. They may also include a fever and a bad taste in your mouth. This condition may be treated with antibiotics, NSAIDs, increasing fluids, or surgery. In some cases, parotitis may go away on its own without treatment. You should drink plenty of fluids, maintain good oral health, and do not use products that contain nicotine or tobacco. This information is not intended to replace advice given to you by your health care provider. Make sure you discuss any questions you have with your health care provider. Document Revised: 03/28/2021 Document Reviewed: 03/28/2021 Elsevier Patient Education  2023 [employer] Inc[employer]  

## 2022-08-31 DIAGNOSIS — J3089 Other allergic rhinitis: Secondary | ICD-10-CM | POA: Diagnosis not present

## 2022-08-31 DIAGNOSIS — J301 Allergic rhinitis due to pollen: Secondary | ICD-10-CM | POA: Diagnosis not present

## 2022-08-31 DIAGNOSIS — J3081 Allergic rhinitis due to animal (cat) (dog) hair and dander: Secondary | ICD-10-CM | POA: Diagnosis not present

## 2022-09-01 ENCOUNTER — Other Ambulatory Visit: Payer: Self-pay | Admitting: Internal Medicine

## 2022-09-01 DIAGNOSIS — Z961 Presence of intraocular lens: Secondary | ICD-10-CM | POA: Diagnosis not present

## 2022-09-01 DIAGNOSIS — H20029 Recurrent acute iridocyclitis, unspecified eye: Secondary | ICD-10-CM

## 2022-09-01 DIAGNOSIS — H43823 Vitreomacular adhesion, bilateral: Secondary | ICD-10-CM | POA: Diagnosis not present

## 2022-09-01 DIAGNOSIS — E1122 Type 2 diabetes mellitus with diabetic chronic kidney disease: Secondary | ICD-10-CM

## 2022-09-01 DIAGNOSIS — H15119 Episcleritis periodica fugax, unspecified eye: Secondary | ICD-10-CM

## 2022-09-01 DIAGNOSIS — H401132 Primary open-angle glaucoma, bilateral, moderate stage: Secondary | ICD-10-CM | POA: Diagnosis not present

## 2022-09-01 DIAGNOSIS — E119 Type 2 diabetes mellitus without complications: Secondary | ICD-10-CM | POA: Diagnosis not present

## 2022-09-01 DIAGNOSIS — H20012 Primary iridocyclitis, left eye: Secondary | ICD-10-CM | POA: Diagnosis not present

## 2022-09-02 ENCOUNTER — Other Ambulatory Visit (INDEPENDENT_AMBULATORY_CARE_PROVIDER_SITE_OTHER): Payer: Medicare HMO

## 2022-09-02 VITALS — Temp 97.7°F

## 2022-09-02 DIAGNOSIS — Z23 Encounter for immunization: Secondary | ICD-10-CM

## 2022-09-03 ENCOUNTER — Other Ambulatory Visit: Payer: Medicare HMO

## 2022-09-03 ENCOUNTER — Other Ambulatory Visit: Payer: Self-pay | Admitting: Internal Medicine

## 2022-09-03 DIAGNOSIS — R7309 Other abnormal glucose: Secondary | ICD-10-CM | POA: Diagnosis not present

## 2022-09-03 DIAGNOSIS — H20029 Recurrent acute iridocyclitis, unspecified eye: Secondary | ICD-10-CM | POA: Diagnosis not present

## 2022-09-03 DIAGNOSIS — M450 Ankylosing spondylitis of multiple sites in spine: Secondary | ICD-10-CM

## 2022-09-03 DIAGNOSIS — H15119 Episcleritis periodica fugax, unspecified eye: Secondary | ICD-10-CM | POA: Diagnosis not present

## 2022-09-03 DIAGNOSIS — M545 Low back pain, unspecified: Secondary | ICD-10-CM

## 2022-09-05 NOTE — Progress Notes (Signed)
<><><><><><><><><><><><><><><><><><><><><><><><><><><><><><><><><> <><><><><><><><><><><><><><><><><><><><><><><><><><><><><><><><><>  -   Please fax copy of Labs to Dr Katy Fitch  <><><><><><><><><><><><><><><><><><><><><><><><><><><><><><><><><> <><><><><><><><><><><><><><><><><><><><><><><><><><><><><><><><><>  -  2 tests  ( RPR & FTA- ABS =  Fluorescent Treponemal Ab ) are                                              POSITIVE for Syphilis infection  - Important that you Call the                   [facility]  [phone]    to arrange for Treatment   <><><><><><><><><><><><><><><><><><><><><><><><><><><><><><><><><> <><><><><><><><><><><><><><><><><><><><><><><><><><><><><><><><><>

## 2022-09-06 LAB — C-REACTIVE PROTEIN: CRP: 0.3 mg/L (ref <8.0)

## 2022-09-06 LAB — CBC WITH DIFFERENTIAL/PLATELET
Absolute Monocytes: 1035 cells/uL — ABNORMAL HIGH (ref 200–950)
Basophils Absolute: 28 cells/uL (ref 0–200)
Basophils Relative: 0.2 %
Eosinophils Absolute: 14 cells/uL — ABNORMAL LOW (ref 15–500)
Eosinophils Relative: 0.1 %
HCT: 42.4 % (ref 35.0–45.0)
Hemoglobin: 13.5 g/dL (ref 11.7–15.5)
Lymphs Abs: 3505 cells/uL (ref 850–3900)
MCH: 26.5 pg — ABNORMAL LOW (ref 27.0–33.0)
MCHC: 31.8 g/dL — ABNORMAL LOW (ref 32.0–36.0)
MCV: 83.3 fL (ref 80.0–100.0)
MPV: 11.1 fL (ref 7.5–12.5)
Monocytes Relative: 7.5 %
Neutro Abs: 9218 cells/uL — ABNORMAL HIGH (ref 1500–7800)
Neutrophils Relative %: 66.8 %
Platelets: 325 10*3/uL (ref 140–400)
RBC: 5.09 10*6/uL (ref 3.80–5.10)
RDW: 14.6 % (ref 11.0–15.0)
Total Lymphocyte: 25.4 %
WBC: 13.8 10*3/uL — ABNORMAL HIGH (ref 3.8–10.8)

## 2022-09-06 LAB — HEMOGLOBIN A1C
Hgb A1c MFr Bld: 6.8 % of total Hgb — ABNORMAL HIGH (ref <5.7)
Mean Plasma Glucose: 148 mg/dL
eAG (mmol/L): 8.2 mmol/L

## 2022-09-06 LAB — URIC ACID: Uric Acid, Serum: 3.1 mg/dL (ref 2.5–7.0)

## 2022-09-06 LAB — TEST AUTHORIZATION: TEST CODE:: 653

## 2022-09-06 LAB — RPR: RPR Ser Ql: REACTIVE — AB

## 2022-09-06 LAB — RPR TITER: RPR Titer: 1:1 {titer} — ABNORMAL HIGH

## 2022-09-06 LAB — ANA: Anti Nuclear Antibody (ANA): POSITIVE — AB

## 2022-09-06 LAB — ANTI-NUCLEAR AB-TITER (ANA TITER): ANA Titer 1: 1:40 {titer} — ABNORMAL HIGH

## 2022-09-06 LAB — CYCLIC CITRUL PEPTIDE ANTIBODY, IGG: Cyclic Citrullin Peptide Ab: <16 UNITS

## 2022-09-06 LAB — RHEUMATOID FACTOR: Rheumatoid fact SerPl-aCnc: <14 IU/mL (ref <14)

## 2022-09-06 LAB — ANTI-SMITH ANTIBODY: ENA SM Ab Ser-aCnc: <1 AI

## 2022-09-06 LAB — B. BURGDORFI ANTIBODIES: B burgdorferi Ab IgG+IgM: <0.9 index

## 2022-09-06 LAB — SEDIMENTATION RATE: Sed Rate: 6 mm/h (ref 0–30)

## 2022-09-06 LAB — T.PALLIDUM AB, TOTAL: T pallidum Antibodies (TP-PA): REACTIVE — AB

## 2022-09-06 LAB — ANGIOTENSIN CONVERTING ENZYME: Angiotensin-Converting Enzyme: 39 U/L (ref 9–67)

## 2022-09-06 LAB — FLUORESCENT TREPONEMAL AB(FTA)-IGG-BLD: Fluorescent Treponemal ABS: REACTIVE — AB

## 2022-09-07 ENCOUNTER — Encounter: Payer: Self-pay | Admitting: Internal Medicine

## 2022-09-09 DIAGNOSIS — J301 Allergic rhinitis due to pollen: Secondary | ICD-10-CM | POA: Diagnosis not present

## 2022-09-09 DIAGNOSIS — J3089 Other allergic rhinitis: Secondary | ICD-10-CM | POA: Diagnosis not present

## 2022-09-09 DIAGNOSIS — J3081 Allergic rhinitis due to animal (cat) (dog) hair and dander: Secondary | ICD-10-CM | POA: Diagnosis not present

## 2022-09-09 DIAGNOSIS — H20012 Primary iridocyclitis, left eye: Secondary | ICD-10-CM | POA: Diagnosis not present

## 2022-09-13 NOTE — Progress Notes (Unsigned)
Future Appointments  Date Time Provider Department  09/14/2022 11:30 AM Unk Pinto, MD [facility]  09/18/2022 11:15 AM Evans Lance, MD [facility]  11/03/2022  2:30 PM Unk Pinto, MD [facility]  12/01/2022  9:00 AM Donato Heinz, MD [facility]  04/22/2023                cpe         3:00 PM Alycia Rossetti, NP [facility]  07/29/2023                wellness  4:00 PM Darrol Jump, NP [facility]     History of Present Illness:       This very nice 71 y.o. MBF presents for follow up with HTN, HLD, Pre-Diabetes and Vitamin D Deficiency.  Patient has GERD controlled with Diet & her Omeprazole.       Patient was recently evaluated by Dr Katy Fitch for acute iridocyclitis & episcleritis who order an extensive data base to determine etiology & RPR returned (+) and  FTA-abs also returned Reactive.            Patient is treated for HTN (2013)  & BP has been controlled at home. Today's BP is at goal -                           . Patient has had no complaints of any cardiac type chest pain, palpitations, dyspnea Vertell Limber /PND, dizziness, claudication or dependent edema.       Hyperlipidemia is not controlled with diet & meds. Patient denies myalgias or other med SE's. Last Lipids were not at goal:  Lab Results  Component Value Date   CHOL 180 04/21/2022   HDL 94 04/21/2022   LDLCALC 73 04/21/2022   TRIG 57 04/21/2022   CHOLHDL 1.9 04/21/2022     Also, the patient has Morbid obesity (BMI 35+) and history of T2_NIDDM w/CKD2  (July 2019 )was started on Metformin.  and has had no symptoms of reactive hypoglycemia, diabetic polys, paresthesias or visual blurring.  Last A1c was not at goal:  Lab Results  Component Value Date   HGBA1C 6.8 (H) 09/03/2022        Further, the patient also has history of Vitamin D Deficiency  ("24" /2019)  and supplements vitamin D without any suspected side-effects. Last vitamin D was still very low :  Lab Results  Component Value Date    VD25OH 36 04/21/2022       Current Outpatient Medications on File Prior to Visit  Medication Sig   VITAMIN D 10,000 Units  TABS Take daily.   cyanocobalamin 100 MCG tablet Take daily.   cyclobenzaprine  10 MG tablet Take 1 tablet   at bedtime as needed for muscle spasms.   fexofenadine  180 MG tablet Take 1 tablet daily.   ADVAIR HFA 115-21  inhaler Inhale 2 puffs  2  times daily.   Hydrochlorothiazide  25 MG tablet TAKE 1 TABLET  EVERY DAY   MAGNESIUM 500 mg  Take  daily.    metFORMIN 500 MG tablet TAKE 1 TABLET 3 X /DAY WITH MEAL   Omeprazole 40 MG capsule TAKE 1 CAPSULE DAILY    Potassium 99 MG TABS Take  2 )times daily.   Red Yeast Rice Extract2,000 mg  Take  daily.      Allergies  Allergen Reactions   Metronidazole Other (See Comments)    unknown  Pneumovax 23 [Pneumococcal Vac Polyvalent]     Redness and severe swelling at injection site   Ppd [Tuberculin Purified Protein Derivative] Other (See Comments)    + PPD 2013 with NEG CXR 05/2012     PMHx:   Past Medical History:  Diagnosis Date   Anemia    Arthritis    "right hand, back" (04/03/2016)   Asthma    Bilateral carpal tunnel syndrome    Chronic lower back pain    GERD (gastroesophageal reflux disease)    Hyperlipidemia    Hypertension    Multiple allergies    Pneumonia 03/2012   Positive PPD    "they told me it wasn't positive; I was allergic to the test itself"   Right rotator cuff tear 01/20/2013   Type II diabetes mellitus ([place])    Vasculitis ([place]) 04/03/2016   Vitamin deficiency    Wears dentures    upper   Wears glasses      Immunization History  Administered Date(s) Administered   Influenza, High Dose  11/10/2017, 09/15/2018, 09/04/2019   Influenza, Seasonal 10/17/2015   Influenza,inj,quad 10/15/2016   Influenza 08/30/2020   PFIZER-SARS-COV-2 Vacc 12/22/2019, 01/12/2020   Pneumococcal -13 12/22/2018   Pneumococcal -23 02/04/2017   Pneumococcal-23 06/27/2013   Tdap 04/23/2012   Zoster  07/31/2015     Past Surgical History:  Procedure Laterality Date   ABDOMINAL HYSTERECTOMY     BILATERAL SALPINGOOPHORECTOMY  11/28/2010   open laparoscopy with adhesiolysis also   CARPAL TUNNEL RELEASE  07/15/2012   Procedure: CARPAL TUNNEL RELEASE;  Surgeon: Cammie Sickle., MD;  Location: [facility];  Service: Orthopedics;  Laterality: Left;   CARPAL TUNNEL RELEASE Right 05/31/2014   Procedure: RIGHT CARPAL TUNNEL RELEASE;  Surgeon: Cammie Sickle, MD;  Location: [facility];  Service: Orthopedics;  Laterality: Right;   COLONOSCOPY     KNEE ARTHROSCOPY Bilateral 2005-2016   "right-left"   SHOULDER ARTHROSCOPY WITH ROTATOR CUFF REPAIR AND SUBACROMIAL DECOMPRESSION Right 01/20/2013   Procedure: RIGHT ARTHROSCOPY SHOULDER DEBRIDEMENT LIMITED, ARTHROSCOPY SHOULDER DECOMPRESSION SUBACROMIAL PARTIAL ACROMIOPLASTY WITH CORACOACROMIAL RELEASE, ROTATOR CUFF REPAIR ;  Surgeon: Johnny Bridge, MD;  Location: [facility];  Service: Orthopedics;  Laterality: Right;  RIGHT SHOULDER SCOPE DEBRIDEMENT, ACRIOMIOPLASTY, ROTATOR CUFF REPAIR   TUBAL LIGATION      FHx:    Reviewed / unchanged  SHx:    Reviewed / unchanged   Systems Review:  Constitutional: Denies fever, chills, wt changes, headaches, insomnia, fatigue, night sweats, change in appetite. Eyes: Denies redness, blurred vision, diplopia, discharge, itchy, watery eyes.  ENT: Denies discharge, congestion, post nasal drip, epistaxis, sore throat, earache, hearing loss, dental pain, tinnitus, vertigo, sinus pain, snoring.  CV: Denies chest pain, palpitations, irregular heartbeat, syncope, dyspnea, diaphoresis, orthopnea, PND, claudication or edema. Respiratory: denies cough, dyspnea, DOE, pleurisy, hoarseness, laryngitis, wheezing.  Gastrointestinal: Denies dysphagia, odynophagia, heartburn, reflux, water brash, abdominal pain or cramps, nausea, vomiting, bloating, diarrhea, constipation,  hematemesis, melena, hematochezia  or hemorrhoids. Genitourinary: Denies dysuria, frequency, urgency, nocturia, hesitancy, discharge, hematuria or flank pain. Musculoskeletal: Denies arthralgias, myalgias, stiffness, jt. swelling, pain, limping or strain/sprain.  Skin: Denies pruritus, rash, hives, warts, acne, eczema or change in skin lesion(s). Neuro: No weakness, tremor, incoordination, spasms, paresthesia or pain. Psychiatric: Denies confusion, memory loss or sensory loss. Endo: Denies change in weight, skin or hair change.  Heme/Lymph: No excessive bleeding, bruising or enlarged lymph nodes.  Physical Exam  There were no vitals taken  for this visit.  Appears  well nourished, well groomed  and in no distress.  Eyes: PERRLA, EOMs, conjunctiva no swelling or erythema. Sinuses: No frontal/maxillary tenderness ENT/Mouth: EAC's clear, TM's nl w/o erythema, bulging. Nares clear w/o erythema, swelling, exudates. Oropharynx clear without erythema or exudates. Oral hygiene is good. Tongue normal, non obstructing. Hearing intact.  Neck: Supple. Thyroid not palpable. Car 2+/2+ without bruits, nodes or JVD. Chest: Respirations nl with BS clear & equal w/o rales, rhonchi, wheezing or stridor.  Cor: Heart sounds normal w/ regular rate and rhythm without sig. murmurs, gallops, clicks or rubs. Peripheral pulses normal and equal  without edema.  Abdomen: Soft & bowel sounds normal. Non-tender w/o guarding, rebound, hernias, masses or organomegaly.  Lymphatics: Unremarkable.  Musculoskeletal: Full ROM all peripheral extremities, joint stability, 5/5 strength and normal gait.  Skin: Warm, dry without exposed rashes, lesions or ecchymosis apparent. All  #10 toenails thickened , chalky, dystrophic.  Neuro: Cranial nerves intact, reflexes equal bilaterally. Sensory-motor testing grossly intact. Tendon reflexes grossly intact.  Pysch: Alert & oriented x 3.  Insight and judgement nl & appropriate. No  ideations.  Assessment and Plan:  1. Essential hypertension  - CBC with Differential/Platelet - COMPLETE METABOLIC PANEL WITH GFR - Magnesium - TSH  2. Hyperlipidemia associated with type 2 diabetes mellitus ([place])  - Continue diet/meds, exercise,& lifestyle modifications.  - Continue monitor periodic cholesterol/liver & renal functions   - Lipid panel - TSH  3. Type 2 diabetes mellitus with stage 2 chronic kidney  disease, without long-term current use of insulin ([name])  - Continue medication, monitor blood pressure at home.  - Continue DASH diet.  Reminder to go to the ER if any CP,  SOB, nausea, dizziness, severe HA, changes vision/speech.  - Continue diet, exercise  - Lifestyle modifications.  - Monitor appropriate labs  - Hemoglobin A1c - Insulin, random  4. Vitamin D deficiency  - Continue supplementation.  - VITAMIN D 25 Hydroxy   5. Onychomycosis toenails x # 10  - Lamisil 250 mg #90  X 1 Rf   6. Medication management  - CBC with Differential/Platelet - COMPLETE METABOLIC PANEL WITH GFR - Magnesium - Lipid panel - TSH - Hemoglobin A1c - Insulin, random - VITAMIN D 25 Hydroxy)             Discussed  regular exercise, BP monitoring, weight control to achieve/maintain BMI less than 25 and discussed med and SE's. Recommended labs to assess and monitor clinical status with further disposition pending results of labs.  I discussed the assessment and treatment plan with the patient. The patient was provided an opportunity to ask questions and all were answered. The patient agreed with the plan and demonstrated an understanding of the instructions.  I provided over 30 minutes of exam, counseling, chart review and  complex critical decision making.         The patient was advised to call back or seek an in-person evaluation if the symptoms worsen or if the condition fails to improve as anticipated.   Kirtland Bouchard, MD

## 2022-09-14 ENCOUNTER — Ambulatory Visit: Payer: Medicare HMO | Admitting: Internal Medicine

## 2022-09-14 ENCOUNTER — Ambulatory Visit (INDEPENDENT_AMBULATORY_CARE_PROVIDER_SITE_OTHER): Payer: Medicare HMO | Admitting: Internal Medicine

## 2022-09-14 ENCOUNTER — Encounter: Payer: Self-pay | Admitting: Internal Medicine

## 2022-09-14 VITALS — BP 160/80 | HR 99 | Temp 97.9°F | Resp 16 | Ht 63.0 in | Wt 193.0 lb

## 2022-09-14 DIAGNOSIS — A5143 Secondary syphilitic oculopathy: Secondary | ICD-10-CM

## 2022-09-14 DIAGNOSIS — I10 Essential (primary) hypertension: Secondary | ICD-10-CM | POA: Diagnosis not present

## 2022-09-14 DIAGNOSIS — A5149 Other secondary syphilitic conditions: Secondary | ICD-10-CM | POA: Diagnosis not present

## 2022-09-14 DIAGNOSIS — J3089 Other allergic rhinitis: Secondary | ICD-10-CM | POA: Diagnosis not present

## 2022-09-14 DIAGNOSIS — J3081 Allergic rhinitis due to animal (cat) (dog) hair and dander: Secondary | ICD-10-CM | POA: Diagnosis not present

## 2022-09-14 DIAGNOSIS — R69 Illness, unspecified: Secondary | ICD-10-CM | POA: Diagnosis not present

## 2022-09-14 DIAGNOSIS — J301 Allergic rhinitis due to pollen: Secondary | ICD-10-CM | POA: Diagnosis not present

## 2022-09-14 NOTE — Patient Instructions (Signed)
Syphilis Syphilis is a curable infection that can spread through sexual contact. It is important to get treatment right away to reduce the possibility of serious complications. There are four stages of syphilis: Primary stage. During this stage, sores may form where the disease entered your body. Secondary stage. During this stage, skin rashes and lesions will form. Latent stage. During this stage, there are no symptoms, but the infection may still be contagious. Tertiary stage. This stage happens 5-30 years after the infection starts. During this stage, the disease damages organs and can lead to death. Most people with treated syphilis do not develop this stage. What are the causes? This condition is caused by bacteria called Treponema pallidum. The condition can spread during sexual activity, such as during oral, anal, or vaginal sex. It can also be spread to an unborn baby (fetus) during pregnancy. What increases the risk? You are more likely to develop this condition if: You do not use a condom during sex. You have sex with a partner who has syphilis. You have a history of sexually transmitted infections (STIs). You use recreational drugs such as methamphetamines, injection drugs, or heroin. What are the signs or symptoms? Symptoms of this condition depend on the stage of the disease. Primary stage One or more painless sores (chancres) in and around the genital organs, mouth, or hands. The sores are usually firm and round. Secondary stage Skin rashes or sores (or both) in the mouth, vagina, anus, palms of the hands, or bottoms of the feet. The rash may be rough, red or reddish-brown, and may not itch. Other symptoms may include: A fever. Swollen lymph glands. A sore throat. Patchy hair loss. A headache. A feeling of being ill or very tired. Weight loss. Latent stage There are no symptoms during this stage. Tertiary stage This stage is rarely noted due to the use of antibiotic  medicine to treat syphilis. However, without treatment, syphilis can further affect different organ systems, especially the heart and nervous system. Signs and symptoms impacting the heart include: Enlargement of the heart. Damage to the aorta. Narrowing of the blood vessels around the heart. Signs and symptoms impacting the nervous system include: Meningitis. Dysfunction of the nerves to the head. Nonspecific growths may also occur in the skin, mucous membranes, bones, or body organs in this stage. Any stage if there is no treatment Signs and symptoms of impacting the nervous system include: A severe headache. Muscle weakness or difficulty coordinating movements, such as walking. Changes in mental state, such as confusion, personality changes, or dementia. Signs and symptoms of impacting the eyes include: Eye pain. Eye redness. Changes in vision. Blindness. Signs and symptoms of impacting the ears include: Hearing loss. Ringing in the ears (tinnitus). Dizziness or feeling like you or your surroundings are moving or spinning (vertigo). How is this diagnosed? This condition is diagnosed with: A physical exam. Blood tests, including: Nontreponemal test. This is usually the first test. It can detect other kinds of antibodies as well. Treponemal test. This test is done if you get a positive result on the nontreponemal test. It specifically looks for antibodies to syphilis. This test will not show whether antibodies are from a past syphilis infection or a current infection. Tests of the fluid (drainage) from a sore or rash. Tests of the fluid around the spine (lumbar puncture). These tests are done to check for an infection in the brain or nervous system in late-stage syphilis. Imaging tests. These may be done to check for damage  to the heart, aorta, or brain if the condition is in the tertiary stage. Tests may include: X-ray. CT scan. MRI. Echocardiogram. This test takes a picture of  the heart. How is this treated? This condition can be cured with antibiotic medicine. It is important to receive treatment as soon as possible to get rid of the infection. However, the treatment may not undo any damage already caused by the infection. Follow these instructions at home: Medicines  Take over-the-counter and prescription medicines only as told by your health care provider. Take your antibiotic medicine as told by your health care provider. Do not stop taking the antibiotic even if you start to feel better. Incomplete treatment will put you at risk for continued infection and could be life-threatening. General instructions Do not have sex until your treatment is completed, or as directed by your health care provider. Tell your recent sexual partners that you were diagnosed with syphilis. It is important that they get treatment, even if they do not have symptoms. Keep all follow-up visits. This is important. How is this prevented? Use latex or polyurethane condoms correctly whenever you have sex. Before you have sex, ask your partner if they have been tested for STIs. Ask about the test results. Avoid having multiple sexual partners. Contact a health care provider if: You continue to have any of the following symptoms 24 hours after beginning treatment: Fever. Chills. Headache. Nausea. Aching all over your body. Your symptoms do not improve, even with treatment. Get help right away if: You have severe chest pain. You have trouble walking or coordinating movements. You are confused. You lose vision or hearing. You have numbness in your arms or legs. You have a seizure. You faint. You have a severe headache that does not go away with medicine. These symptoms may be an emergency. Get help right away. Call 911. Do not wait to see if the symptoms will go away. Do not drive yourself to the hospital. Summary Syphilis is an infection that can spread through sexual contact or  to a fetus during pregnancy. This condition can cause serious complications, so it is best to get treatment right away. The condition can be cured with antibiotic medicine. Take your antibiotic medicine as told by your health care provider. Tell your recent sexual partners that you were diagnosed with syphilis. It is important that they get treatment, even if they do not have symptoms. This information is not intended to replace advice given to you by your health care provider. Make sure you discuss any questions you have with your health care provider. Document Revised: 10/10/2021 Document Reviewed: 10/10/2021 Elsevier Patient Education  [place].

## 2022-09-18 ENCOUNTER — Ambulatory Visit: Payer: Medicare HMO | Attending: Internal Medicine | Admitting: Internal Medicine

## 2022-09-18 ENCOUNTER — Encounter: Payer: Self-pay | Admitting: Internal Medicine

## 2022-09-18 ENCOUNTER — Ambulatory Visit (INDEPENDENT_AMBULATORY_CARE_PROVIDER_SITE_OTHER): Payer: Medicare HMO

## 2022-09-18 VITALS — BP 138/62 | HR 75 | Ht 63.0 in | Wt 192.0 lb

## 2022-09-18 DIAGNOSIS — R002 Palpitations: Secondary | ICD-10-CM

## 2022-09-18 DIAGNOSIS — I471 Supraventricular tachycardia, unspecified: Secondary | ICD-10-CM | POA: Diagnosis not present

## 2022-09-18 NOTE — Patient Instructions (Signed)
Medication Instructions:  Your physician recommends that you continue on your current medications as directed. Please refer to the Current Medication list given to you today.  *If you need a refill on your cardiac medications before your next appointment, please call your pharmacy*   Lab Work: None ordered If you have labs (blood work) drawn today and your tests are completely normal, you will receive your results only by: [place] (if you have MyChart) OR A paper copy in the mail If you have any lab test that is abnormal or we need to change your treatment, we will call you to review the results.   Testing/Procedures:                            [facility]- Long Term Monitor Instructions  Your physician has requested you wear a ZIO patch monitor for 14 days.  This is a single patch monitor. [employer] supplies one patch monitor per enrollment. Additional stickers are not available. Please do not apply patch if you will be having a Nuclear Stress Test,  Echocardiogram, Cardiac CT, MRI, or Chest Xray during the period you would be wearing the  monitor. The patch cannot be worn during these tests. You cannot remove and re-apply the  ZIO XT patch monitor.  Your ZIO patch monitor will be mailed 3 day USPS to your address on file. It may take 3-5 days  to receive your monitor after you have been enrolled.  Once you have received your monitor, please review the enclosed instructions. Your monitor  has already been registered assigning a specific monitor serial # to you.  Billing and Patient Assistance Program Information  We have supplied [employer] with any of your insurance information on file for billing purposes. [employer] offers a sliding scale Patient Assistance Program for patients that do not have  insurance, or whose insurance does not completely cover the cost of the ZIO monitor.  You must apply for the Patient Assistance Program to qualify for this discounted rate.  To apply,  please call [employer] at [phone], select option 4, select option 2, ask to apply for  Patient Assistance Program. Theodore Demark will ask your household income, and how many people  are in your household. They will quote your out-of-pocket cost based on that information.  [employer] will also be able to set up a 5-month interest-free payment plan if needed.  Applying the monitor   Shave hair from upper left chest.  Hold abrader disc by orange tab. Rub abrader in 40 strokes over the upper left chest as  indicated in your monitor instructions.  Clean area with 4 enclosed alcohol pads. Let dry.  Apply patch as indicated in monitor instructions. Patch will be placed under collarbone on left  side of chest with arrow pointing upward.  Rub patch adhesive wings for 2 minutes. Remove white label marked "1". Remove the white  label marked "2". Rub patch adhesive wings for 2 additional minutes.  While looking in a mirror, press and release button in center of patch. A small green light will  flash 3-4 times. This will be your only indicator that the monitor has been turned on.  Do not shower for the first 24 hours. You may shower after the first 24 hours.  Press the button if you feel a symptom. You will hear a small click. Record Date, Time and  Symptom in the Patient Logbook.  When you are ready to remove  the patch, follow instructions on the last 2 pages of Patient  Logbook. Stick patch monitor onto the last page of Patient Logbook.  Place Patient Logbook in the blue and white box. Use locking tab on box and tape box closed  securely. The blue and white box has prepaid postage on it. Please place it in the mailbox as  soon as possible. Your physician should have your test results approximately 7 days after the  monitor has been mailed back to [facility].  Call [facility] at [phone] if you have questions regarding  your ZIO XT patch monitor. Call them immediately if you see  an orange light blinking on your  monitor.  If your monitor falls off in less than 4 days, contact our Monitor department at [phone].  If your monitor becomes loose or falls off after 4 days call [employer] at [phone] for  suggestions on securing your monitor   Follow-Up: At [facility], you and your health needs are our priority.  As part of our continuing mission to provide you with exceptional heart care, we have created designated Provider Care Teams.  These Care Teams include your primary Cardiologist (physician) and Advanced Practice Providers (APPs -  Physician Assistants and Nurse Practitioners) who all work together to provide you with the care you need, when you need it.   Your next appointment:   To be  determined  after monitor has been completed and reviewed  The format for your next appointment:   In Person  Provider:   Cristopher Peru, MD    Thank you for choosing [facility]!!   [phone]  Other Instructions   Important Information About Sugar

## 2022-09-18 NOTE — Progress Notes (Signed)
HPI Lauren Lopez returns today for followup. 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 She underwent successful ablation about a month ago. In the interim she notes that she has been under more stress and felt like her heart has gone back to racing. No syncope.  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 12 g 2   blood glucose meter kit and supplies Test sugars twice daily. Dx E11.9 1 each 0   Cholecalciferol (VITAMIN D3) 250 MCG (10000 UT) capsule Take 10,000 Units by mouth daily.     diltiazem (CARDIZEM CD) 300 MG 24 hr capsule Take 1 capsule (300 mg total) by mouth daily. 90 capsule 3   docusate sodium (COLACE) 100 MG capsule Take 100 mg by mouth daily.     EPINEPHrine 0.3 mg/0.3 mL IJ SOAJ injection Inject 0.3 mg into the muscle as needed for anaphylaxis.     fexofenadine (ALLEGRA) 180 MG tablet Take 1 tablet (180 mg total) by mouth daily. 90 tablet 6   Lancets (ONETOUCH DELICA PLUS [id number]) MISC TEST SUGAR TWICE A DAY 200 each 1   latanoprost (XALATAN) 0.005 % ophthalmic solution Place 1 drop into both eyes at bedtime.     Magnesium 250 MG TABS Take 250 mg by mouth in the morning and at bedtime.     metFORMIN (GLUCOPHAGE) 500 MG tablet TAKE 1 TABLET 3 X /DAY WITH MEALS FOR DIABETES 270 tablet 3   Misc Natural Products (NEURIVA PO) Take 1 tablet by mouth daily.     mometasone (NASONEX) 50 MCG/ACT nasal spray Place 2 sprays into the nose daily. 1 each 2   omeprazole (PRILOSEC) 40 MG capsule Take one capsule daily  for indigestion and heartburn 90 capsule 3   ONETOUCH ULTRA test strip USE TO TEST SUGAR TWICE A DAY 100 strip 3   prednisoLONE acetate (PRED FORTE) 1 % ophthalmic suspension Place 1 drop into the left eye. Instill 1 drop into the left eye 4 times daily for 1 week, 3 times daily for 1 week, 2 times daily for 1 week, 1 time daily until seen by provider     Rhubarb (ESTROVEN MENOPAUSE RELIEF PO) Take 1 capsule by mouth daily.     rosuvastatin (CRESTOR) 20 MG tablet Take  1 tablet  Daily  for Cholesterol 90 tablet 3   No current facility-administered medications for this visit.     Past Medical History:  Diagnosis Date   Acute asthma exacerbation 02/20/2022   Arthritis    "right hand, back" (04/03/2016)   Asthma    takes weekly allergy shots, none in 5-6 weeks due to knee surgery   Bilateral carpal tunnel syndrome    Chronic lower back pain    GERD (gastroesophageal reflux disease)    Hyperlipidemia    Hypertension    Multiple allergies    Pneumonia 03/2012   Positive PPD    "they told me it wasn't positive; I was allergic to the test itself"  Right rotator cuff tear 01/20/2013   Type II diabetes mellitus ([place])    Vasculitis ([place]) 04/03/2016   Vitamin deficiency    Wears dentures    upper   Wears glasses     ROS:   All systems reviewed and negative except as noted in the HPI.   Past Surgical History:  Procedure Laterality Date   ABDOMINAL HYSTERECTOMY     BILATERAL SALPINGOOPHORECTOMY  11/28/2010   open laparoscopy with adhesiolysis also   CARPAL TUNNEL RELEASE  07/15/2012   Procedure: CARPAL TUNNEL RELEASE;  Surgeon: Cammie Sickle., MD;  Location: [facility];  Service: Orthopedics;  Laterality: Left;   CARPAL TUNNEL RELEASE Right 05/31/2014   Procedure: RIGHT CARPAL TUNNEL RELEASE;  Surgeon: Cammie Sickle, MD;  Location: [facility];  Service: Orthopedics;  Laterality: Right;   COLONOSCOPY     KNEE ARTHROSCOPY Bilateral 2005-2016    "right-left"   SHOULDER ARTHROSCOPY WITH ROTATOR CUFF REPAIR AND SUBACROMIAL DECOMPRESSION Right 01/20/2013   Procedure: RIGHT ARTHROSCOPY SHOULDER DEBRIDEMENT LIMITED, ARTHROSCOPY SHOULDER DECOMPRESSION SUBACROMIAL PARTIAL ACROMIOPLASTY WITH CORACOACROMIAL RELEASE, ROTATOR CUFF REPAIR ;  Surgeon: Johnny Bridge, MD;  Location: [facility];  Service: Orthopedics;  Laterality: Right;  RIGHT SHOULDER SCOPE DEBRIDEMENT, ACRIOMIOPLASTY, ROTATOR CUFF REPAIR   SVT ABLATION N/A 08/17/2022   Procedure: SVT ABLATION;  Surgeon: Evans Lance, MD;  Location: [facility];  Service: Cardiovascular;  Laterality: N/A;   TOTAL KNEE ARTHROPLASTY Right 02/02/2022   Procedure: TOTAL KNEE ARTHROPLASTY;  Surgeon: Gaynelle Arabian, MD;  Location: [facility];  Service: Orthopedics;  Laterality: Right;   TUBAL LIGATION       Family History  Problem Relation Age of Onset   Hypertension Mother    Diabetes Mother    Dementia Mother    Hypertension Father    Alzheimer's disease Father    Tongue cancer Sister        smoker   Cirrhosis Brother    Hypertension Brother    COPD Brother        smoker   Hypertension Brother      Social History   Socioeconomic History   Marital status: Married    Spouse name: Not on file   Number of children: Not on file   Years of education: Not on file   Highest education level: Not on file  Occupational History   Occupation: retired  Tobacco Use   Smoking status: Never   Smokeless tobacco: Never  Vaping Use   Vaping Use: Never used  Substance and Sexual Activity   Alcohol use: No   Drug use: No   Sexual activity: Not Currently    Birth control/protection: Post-menopausal  Other Topics Concern   Not on file  Social History Narrative   Not on file   Social Determinants of Health   Financial Resource Strain: Not on file  Food Insecurity: Not on file  Transportation Needs: Not on file  Physical Activity: Not on file  Stress: Not on file  Social  Connections: Not on file  Intimate Partner Violence: Not on file     BP 138/62   Pulse 75   Ht 5' 3" (1.6 m)   Wt 192 lb (87.1 kg)   SpO2 97%   BMI 34.01 kg/m   Physical Exam:  Well appearing NAD HEENT: Unremarkable Neck:  No JVD, no thyromegally Lymphatics:  No adenopathy Back:  No CVA tenderness Lungs:  Clear with no wheezes HEART:  Regular rate rhythm, no murmurs, no rubs, no clicks Abd:  soft, positive bowel sounds, no organomegally, no rebound, no guarding Ext:  2 plus pulses, no edema, no cyanosis, no clubbing Skin:  No rashes no nodules Neuro:  CN II through XII intact, motor grossly intact  EKG - nsr   Assess/Plan:  SVT - she has had recurrent heart racing. Unclear if this represents more SVT or sinus tachycardia as she has been under more stress. She had previously had incessant SVT. I have asked her to wear a 14 day zio monitor. Additional followup will be based on the results.  HTN -her bp is elevated despite 300 mg of diltiazem. I considered having her add more medication for bp but will hold off for now.  Carleene Overlie Blessin Kanno,MD

## 2022-09-21 DIAGNOSIS — J3081 Allergic rhinitis due to animal (cat) (dog) hair and dander: Secondary | ICD-10-CM | POA: Diagnosis not present

## 2022-09-21 DIAGNOSIS — J301 Allergic rhinitis due to pollen: Secondary | ICD-10-CM | POA: Diagnosis not present

## 2022-09-21 DIAGNOSIS — J3089 Other allergic rhinitis: Secondary | ICD-10-CM | POA: Diagnosis not present

## 2022-09-22 ENCOUNTER — Ambulatory Visit
Admission: RE | Admit: 2022-09-22 | Discharge: 2022-09-22 | Disposition: A | Discharge status: Home / Self Care | Payer: Medicare HMO | Source: Ambulatory Visit | Attending: Nurse Practitioner | Admitting: Nurse Practitioner

## 2022-09-22 DIAGNOSIS — M810 Age-related osteoporosis without current pathological fracture: Secondary | ICD-10-CM

## 2022-09-22 DIAGNOSIS — M8589 Other specified disorders of bone density and structure, multiple sites: Secondary | ICD-10-CM | POA: Diagnosis not present

## 2022-09-22 DIAGNOSIS — Z780 Asymptomatic menopausal state: Secondary | ICD-10-CM | POA: Diagnosis not present

## 2022-09-23 DIAGNOSIS — H20012 Primary iridocyclitis, left eye: Secondary | ICD-10-CM | POA: Diagnosis not present

## 2022-09-29 DIAGNOSIS — J301 Allergic rhinitis due to pollen: Secondary | ICD-10-CM | POA: Diagnosis not present

## 2022-09-29 DIAGNOSIS — J3089 Other allergic rhinitis: Secondary | ICD-10-CM | POA: Diagnosis not present

## 2022-09-29 DIAGNOSIS — J3081 Allergic rhinitis due to animal (cat) (dog) hair and dander: Secondary | ICD-10-CM | POA: Diagnosis not present

## 2022-10-06 DIAGNOSIS — R002 Palpitations: Secondary | ICD-10-CM | POA: Diagnosis not present

## 2022-10-06 DIAGNOSIS — J3089 Other allergic rhinitis: Secondary | ICD-10-CM | POA: Diagnosis not present

## 2022-10-06 DIAGNOSIS — J301 Allergic rhinitis due to pollen: Secondary | ICD-10-CM | POA: Diagnosis not present

## 2022-10-13 DIAGNOSIS — J3089 Other allergic rhinitis: Secondary | ICD-10-CM | POA: Diagnosis not present

## 2022-10-13 DIAGNOSIS — J301 Allergic rhinitis due to pollen: Secondary | ICD-10-CM | POA: Diagnosis not present

## 2022-10-15 ENCOUNTER — Other Ambulatory Visit: Payer: Self-pay

## 2022-10-15 ENCOUNTER — Encounter: Payer: Self-pay | Admitting: Nurse Practitioner

## 2022-10-15 ENCOUNTER — Ambulatory Visit: Payer: Medicare HMO | Admitting: Nurse Practitioner

## 2022-10-15 VITALS — HR 110 | Temp 97.9°F

## 2022-10-15 DIAGNOSIS — U071 COVID-19: Secondary | ICD-10-CM | POA: Diagnosis not present

## 2022-10-15 DIAGNOSIS — G4483 Primary cough headache: Secondary | ICD-10-CM

## 2022-10-15 LAB — POC INFLUENZA A&B (BINAX/QUICKVUE)
Influenza A, POC: NEGATIVE
Influenza B, POC: NEGATIVE

## 2022-10-15 LAB — POC COVID19 BINAXNOW: SARS Coronavirus 2 Ag: POSITIVE — AB

## 2022-10-15 MED ORDER — AZITHROMYCIN 250 MG PO TABS: ORAL_TABLET | ORAL | 1 refills | Status: DC

## 2022-10-15 MED ORDER — DEXAMETHASONE 2 MG PO TABS: ORAL_TABLET | ORAL | 0 refills | Status: DC

## 2022-10-15 MED ORDER — BENZONATATE 100 MG PO CAPS: 100.0000 mg | ORAL_CAPSULE | Freq: Four times a day (QID) | ORAL | 1 refills | Status: DC | PRN

## 2022-10-15 MED ORDER — PROMETHAZINE-DM 6.25-15 MG/5ML PO SYRP: 5.0000 mL | ORAL_SOLUTION | Freq: Four times a day (QID) | ORAL | 1 refills | Status: DC | PRN

## 2022-10-15 NOTE — Patient Instructions (Signed)
Take tylenol PRN temp 101+ Push hydration Regular ambulation or calf exercises exercises for clot prevention and 81 mg ASA unless contraindicated Sx supportive therapy suggested Follow up via mychart or telephone if needed Advised patient obtain O2 monitor; present to ED if persistently <90% or with severe dyspnea, CP, fever uncontrolled by tylenol, confusion, sudden decline Should remain in isolation for next 5 days and then wear a mask when around anyone the next 5 days

## 2022-10-15 NOTE — Progress Notes (Signed)
THIS ENCOUNTER IS A VIRTUAL VISIT DUE TO COVID-19 - PATIENT WAS NOT SEEN IN THE OFFICE.  PATIENT HAS CONSENTED TO VIRTUAL VISIT / TELEMEDICINE VISIT   Virtual Visit via telephone Note  I connected with  Lauren Lopez on 10/15/2022 by telephone.  I verified that I am speaking with the correct person using two identifiers.    I discussed the limitations of evaluation and management by telemedicine and the availability of in person appointments. The patient expressed understanding and agreed to proceed.  History of Present Illness:  There were no vitals taken for this visit. 71 y.o. patient contacted office reporting URI sx . she tested positive by office parking lot today. OV was conducted by telephone to minimize exposure. This patient was vaccinated for covid 19, last 04/01/21 Immunization History  Administered Date(s) Administered   Influenza, High Dose Seasonal PF 11/10/2017, 09/15/2018, 09/04/2019, 04/26/2020, 10/14/2021, 09/02/2022   Influenza, Seasonal, Injecte, Preservative Fre 10/17/2015   Influenza,inj,quad, With Preservative 10/15/2016   Influenza-Unspecified 08/30/2020   PFIZER(Purple Top)SARS-COV-2 Vaccination 12/22/2019, 01/12/2020, 04/26/2020, 04/01/2021   Pneumococcal Conjugate-13 12/22/2018   Pneumococcal Polysaccharide-23 02/04/2017, 04/25/2021, 10/27/2021   Pneumococcal-Unspecified 06/27/2013   Tdap 04/23/2012   Zoster, Live 07/31/2015    Sx began 1 days ago with Productive cough of yellow phlegm, nasal congestion, body aches, headaches, sore throat.  Denies loss of taste , smell, nausea, vomiting , diarrhea  Treatments tried so far: nothing  Exposures: unknown   Medications  Current Outpatient Medications (Endocrine & Metabolic):    metFORMIN (GLUCOPHAGE) 500 MG tablet, TAKE 1 TABLET 3 X /DAY WITH MEALS FOR DIABETES  Current Outpatient Medications (Cardiovascular):    diltiazem (CARDIZEM CD) 300 MG 24 hr capsule, Take 1 capsule (300 mg total) by mouth  daily.   EPINEPHrine 0.3 mg/0.3 mL IJ SOAJ injection, Inject 0.3 mg into the muscle as needed for anaphylaxis.   rosuvastatin (CRESTOR) 20 MG tablet, Take  1 tablet  Daily  for Cholesterol  Current Outpatient Medications (Respiratory):    ADVAIR HFA 742-59 MCG/ACT inhaler, Inhale 2 puffs into the lungs 2 (two) times daily.   fexofenadine (ALLEGRA) 180 MG tablet, Take 1 tablet (180 mg total) by mouth daily.   mometasone (NASONEX) 50 MCG/ACT nasal spray, Place 2 sprays into the nose daily.    Current Outpatient Medications (Other):    blood glucose meter kit and supplies, Test sugars twice daily. Dx E11.9   Cholecalciferol (VITAMIN D3) 250 MCG (10000 UT) capsule, Take 10,000 Units by mouth daily.   docusate sodium (COLACE) 100 MG capsule, Take 100 mg by mouth daily.   Lancets (ONETOUCH DELICA PLUS [id number]) MISC, TEST SUGAR TWICE A DAY   latanoprost (XALATAN) 0.005 % ophthalmic solution, Place 1 drop into both eyes at bedtime.   Magnesium 250 MG TABS, Take 250 mg by mouth in the morning and at bedtime.   Misc Natural Products (NEURIVA PO), Take 1 tablet by mouth daily.   omeprazole (PRILOSEC) 40 MG capsule, Take one capsule daily for indigestion and heartburn   ONETOUCH ULTRA test strip, USE TO TEST SUGAR TWICE A DAY   prednisoLONE acetate (PRED FORTE) 1 % ophthalmic suspension, Place 1 drop into the left eye. Instill 1 drop into the left eye 4 times daily for 1 week, 3 times daily for 1 week, 2 times daily for 1 week, 1 time daily until seen by provider   Rhubarb (ESTROVEN MENOPAUSE RELIEF PO), Take 1 capsule by mouth daily.  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 Redness and severe swelling at injection site    Problem list She has Essential hypertension; GERD (gastroesophageal reflux disease); Vitamin D  deficiency; Asthma; Medication management; Osteoporosis; Obesity (BMI 30.0-34.9); Hyperlipidemia associated with type 2 diabetes mellitus ([place]); CKD stage 2 due to type 2 diabetes mellitus ([place]); Type 2 diabetes mellitus with stage 2 chronic kidney disease, without long-term current use of insulin ([place]); Lipoma of right upper extremity; Allergic rhinitis due to pollen; S/P total knee arthroplasty, right; Glaucoma; Prolonged QT interval; S/P knee replacement; SVT (supraventricular tachycardia); 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 02/20/2022; and Hepatic steatosis on their problem list.   Social History:   reports that she has never smoked. She has never used smokeless tobacco. She reports that she does not drink alcohol and does not use drugs.  Observations/Objective:  General : Well sounding patient in no apparent distress HEENT: no hoarseness, no cough for duration of visit Lungs: speaks in complete sentences, no audible wheezing, no apparent distress Neurological: alert, oriented x 3 Psychiatric: pleasant, judgement appropriate   Assessment and Plan:  Covid 19 Covid 19 positive per rapid screening test in office parking lot today Risk factors include: Hypertension , Diabetes, Asthma, Obesity Symptoms are: mild Immue support reviewed  Take tylenol PRN temp 101+ Push hydration Regular ambulation or calf exercises exercises for clot prevention and 81 mg ASA unless contraindicated Sx supportive therapy suggested Follow up via mychart or telephone if needed Advised patient obtain O2 monitor; present to ED if persistently <90% or with severe dyspnea, CP, fever uncontrolled by tylenol, confusion, sudden decline Should remain in isolation for next 5 days and then wear a mask when around anyone the next 5 days  Lauren Lopez was seen today for cough.  Diagnoses and all orders for this visit:  Cough headache -     POC Influenza A&B (Binax test)- negative -     POC COVID-19-  positive  COVID -     dexamethasone (DECADRON) 2 MG tablet; Take 3 tabs for 3 days, 2 tabs for 3 days 1 tab for 5 days. Take with food. -     benzonatate (TESSALON PERLES) 100 MG capsule; Take 1 capsule (100 mg total) by mouth every 6 (six) hours as needed for cough. -     azithromycin (ZITHROMAX) 250 MG tablet; Take 2 tablets (500 mg) on  Day 1,  followed by 1 tablet (250 mg) once daily on Days 2 through 5. -     promethazine-dextromethorphan (PROMETHAZINE-DM) 6.25-15 MG/5ML syrup; Take 5 mLs by mouth 4 (four) times daily as needed for cough.       Follow Up Instructions:  I discussed the assessment and treatment plan with the patient. The patient was provided an opportunity to ask questions and all were answered. The patient agreed with the plan and demonstrated an understanding of the instructions.   The patient was advised to call back or seek an in-person evaluation if the symptoms worsen or if the condition fails to improve as anticipated.  I provided 20 minutes of non-face-to-face time during this encounter.   Alycia Rossetti, NP

## 2022-10-23 ENCOUNTER — Other Ambulatory Visit: Payer: Self-pay | Admitting: Nurse Practitioner

## 2022-10-30 DIAGNOSIS — J3081 Allergic rhinitis due to animal (cat) (dog) hair and dander: Secondary | ICD-10-CM | POA: Diagnosis not present

## 2022-10-30 DIAGNOSIS — H1045 Other chronic allergic conjunctivitis: Secondary | ICD-10-CM | POA: Diagnosis not present

## 2022-10-30 DIAGNOSIS — J3089 Other allergic rhinitis: Secondary | ICD-10-CM | POA: Diagnosis not present

## 2022-10-30 DIAGNOSIS — J301 Allergic rhinitis due to pollen: Secondary | ICD-10-CM | POA: Diagnosis not present

## 2022-10-30 DIAGNOSIS — J4530 Mild persistent asthma, uncomplicated: Secondary | ICD-10-CM | POA: Diagnosis not present

## 2022-11-03 ENCOUNTER — Ambulatory Visit: Payer: Medicare HMO | Admitting: Internal Medicine

## 2022-11-06 DIAGNOSIS — J301 Allergic rhinitis due to pollen: Secondary | ICD-10-CM | POA: Diagnosis not present

## 2022-11-06 DIAGNOSIS — J3081 Allergic rhinitis due to animal (cat) (dog) hair and dander: Secondary | ICD-10-CM | POA: Diagnosis not present

## 2022-11-06 DIAGNOSIS — J3089 Other allergic rhinitis: Secondary | ICD-10-CM | POA: Diagnosis not present

## 2022-11-10 ENCOUNTER — Telehealth: Payer: Self-pay | Admitting: Internal Medicine

## 2022-11-10 NOTE — Telephone Encounter (Signed)
Follow Up:      Patient would like for someone to :explain he Monitor results please. She also wants to know what dies Dr Lovena Le plan to do next, she said she still feels real tired and discomfort in her chest.

## 2022-11-10 NOTE — Telephone Encounter (Signed)
Pt called and shared Heart Monitor results per Dr. Lovena Le.  Read report to patient.    Pt then stated she has had intermittent chest tightness for over a week.  It began last Monday, and sometimes she has dyspnea with exertion.  She has to sit down walking across the living room.  It affects her ADLs.  She denies other cardiac symptoms.    I had Pt do her BP and HR with me on the telephone.  Her BP was 147/73, and her HR was 90.  Pt was concern concerned about her fluctuating blood pressure, and if it was contributing to her chest tightness?  Pt states she is not feeling well, and requested an appointment at [facility] stated she has an appointment in January with Dr. Gardiner Rhyme, but does not want to wait that long.    I agreed with Pt, being asymptomatic, and scheduled an appointment with Mr Jaquelyn Bitter NP for an assessment of her chest tightness and DOE.  Pt advised if symptoms worsen to call [facility] or go to the nearest ER.  Pt understood advisement.

## 2022-11-12 NOTE — Progress Notes (Signed)
Office Visit    Patient Name: Lauren Lopez Date of Encounter: 11/12/2022  Primary Care Provider:  Unk Pinto, MD Primary Cardiologist:  Donato Heinz, MD Primary Electrophysiologist: None  Chief Complaint    Lauren Lopez is a 71 y.o. female with PMH of HTN, DM type II, asthma, SVT, HLD who presents today for complaint of chest tightness and dyspnea on exertion.  Past Medical History    Past Medical History:  Diagnosis Date   Acute asthma exacerbation 02/20/2022   Arthritis    "right hand, back" (04/03/2016)   Asthma    takes weekly allergy shots, none in 5-6 weeks due to knee surgery   Bilateral carpal tunnel syndrome    Chronic lower back pain    GERD (gastroesophageal reflux disease)    Hyperlipidemia    Hypertension    Multiple allergies    Pneumonia 03/2012   Positive PPD    "they told me it wasn't positive; I was allergic to the test itself"   Right rotator cuff tear 01/20/2013   Type II diabetes mellitus ([place])    Vasculitis ([place]) 04/03/2016   Vitamin deficiency    Wears dentures    upper   Wears glasses    Past Surgical History:  Procedure Laterality Date   ABDOMINAL HYSTERECTOMY     BILATERAL SALPINGOOPHORECTOMY  11/28/2010   open laparoscopy with adhesiolysis also   CARPAL TUNNEL RELEASE  07/15/2012   Procedure: CARPAL TUNNEL RELEASE;  Surgeon: Cammie Sickle., MD;  Location: [facility] [place];  Service: Orthopedics;  Laterality: Left;   CARPAL TUNNEL RELEASE Right 05/31/2014   Procedure: RIGHT CARPAL TUNNEL RELEASE;  Surgeon: Cammie Sickle, MD;  Location: [facility];  Service: Orthopedics;  Laterality: Right;   COLONOSCOPY     KNEE ARTHROSCOPY Bilateral 2005-2016   "right-left"   SHOULDER ARTHROSCOPY WITH ROTATOR CUFF REPAIR AND SUBACROMIAL DECOMPRESSION Right 01/20/2013   Procedure: RIGHT ARTHROSCOPY SHOULDER DEBRIDEMENT LIMITED, ARTHROSCOPY SHOULDER DECOMPRESSION SUBACROMIAL PARTIAL ACROMIOPLASTY  WITH CORACOACROMIAL RELEASE, ROTATOR CUFF REPAIR ;  Surgeon: Johnny Bridge, MD;  Location: [facility];  Service: Orthopedics;  Laterality: Right;  RIGHT SHOULDER SCOPE DEBRIDEMENT, ACRIOMIOPLASTY, ROTATOR CUFF REPAIR   SVT ABLATION N/A 08/17/2022   Procedure: SVT ABLATION;  Surgeon: Evans Lance, MD;  Location: [facility];  Service: Cardiovascular;  Laterality: N/A;   TOTAL KNEE ARTHROPLASTY Right 02/02/2022   Procedure: TOTAL KNEE ARTHROPLASTY;  Surgeon: Gaynelle Arabian, MD;  Location: [facility];  Service: Orthopedics;  Laterality: Right;   TUBAL LIGATION      Allergies  Allergies  Allergen Reactions   Metronidazole Swelling    Coats tongue white and tongue swelling    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  Lauren Lopez was initially seen in 2010 by Dr. Aundra Dubin for complaint of chest pain that was waxing and waning.  She reported episodic tightness that was centrally located.  Myoview was ordered that was normal.  She was lost to follow-up until 01/2022 when she was seen by Dr. Nechama Guard for evaluation of SVT.  She had recently underwent TKA and suffered asthma exacerbation  requiring BiPAP and Solu-Medrol with bronchodilators.  Cardiac CTA was performed that showed no PE and patient was currently on Xarelto for s/p knee replacement.  She was having runs of SVT in the 140s that were asymptomatic.  She was started on Cardizem 60 mg every 6 hours.  2D echo was completed showing EF 55-60% with no valvular abnormalities. Zio patch was placed and responded with rapid atrial fibrillation with rates in the 150s to 60s.  There were multiple calls from Z[facility] about heart rates in the 180s.   Her Cardizem was increased to 300 mg daily and TSH and T4 were obtained for further evaluation.  She was referred to Dr. Lovena Le for evaluation of SVT.  He determined that her tachycardia may be AV node dependent and patient was symptomatic with recommendation of watchful waiting and continue Cardizem.  She returned to follow-up in the office and was doing well with exception of feeling tired with occasional ankle edema.  She reported some daytime somnolence and sleep study was also ordered.  She was seen by Dr. Lovena Le who suspected her dyspnea to be related to SVT and episodic.  She underwent SVT ablation 08/17/2022 that was successful.  She presented for follow-up 09/18/2022 with complaint of recurrent heart racing.  14-day ZIO monitor was placed to determine burden that revealed rare PVCs and no prolonged pauses or atrial fibs.  She contacted our office on 11/10/2022 with complaint of intermittent chest pain and shortness of breath.  Lauren Lopez presents today for complaint of shortness of breath and chest pain with her husband.  Since last being seen in the office patient reports that she experienced a bout of shortness of breath last week while at church.  She had parked away from her church and had to walk up a steep hill and when she got to the [place] she was very short of breath and sweating profusely.  She denied any chest pain during this episode but does state that she was very tired.  She reports that on 11/03/2022 she went to dinner with her husband and developed discomfort over her left chest with a sensation of having to belch.  She reports that she still has the sensation of feeling like she has to belch but denies any chest pain traveling to her arm or upper neck.  She does state that her chest pain is palpable over her left precordial area.  She endorses occasional bouts of tachycardia but none that are sustained.  She denies any chest discomfort with these bouts of tachycardia.  Patient denies  chest pain, palpitations, dyspnea, PND, orthopnea, nausea, vomiting, dizziness, syncope, edema, weight gain, or early satiety.   Home Medications    Current Outpatient Medications  Medication Sig Dispense Refill   ADVAIR HFA 230-21 MCG/ACT inhaler Use  2 Inhalations  2 x /day  15 minutes apart   2 x /day (every 12 hours) 12 each 2   azithromycin (ZITHROMAX) 250 MG tablet Take 2 tablets (500 mg) on  Day 1,  followed by 1 tablet (250 mg) once daily on Days 2 through 5. 6 each 1   benzonatate (TESSALON PERLES) 100 MG capsule Take 1 capsule (100 mg total) by mouth every 6 (six) hours as needed for cough. 30 capsule 1   blood glucose meter kit and supplies Test sugars twice daily. Dx E11.9 1 each 0   Cholecalciferol (VITAMIN D3) 250 MCG (10000 UT) capsule Take 10,000 Units by mouth daily.     dexamethasone (DECADRON)  2 MG tablet Take 3 tabs for 3 days, 2 tabs for 3 days 1 tab for 5 days. Take with food. 20 tablet 0   diltiazem (CARDIZEM CD) 300 MG 24 hr capsule Take 1 capsule (300 mg total) by mouth daily. 90 capsule 3   docusate sodium (COLACE) 100 MG capsule Take 100 mg by mouth daily.     EPINEPHrine 0.3 mg/0.3 mL IJ SOAJ injection Inject 0.3 mg into the muscle as needed for anaphylaxis.     fexofenadine (ALLEGRA) 180 MG tablet Take 1 tablet (180 mg total) by mouth daily. 90 tablet 6   guaiFENesin (MUCINEX PO) Take by mouth.     Lancets (ONETOUCH DELICA PLUS [id number]) MISC TEST SUGAR TWICE A DAY 200 each 1   latanoprost (XALATAN) 0.005 % ophthalmic solution Place 1 drop into both eyes at bedtime.     Magnesium 250 MG TABS Take 250 mg by mouth in the morning and at bedtime.     metFORMIN (GLUCOPHAGE) 500 MG tablet TAKE 1 TABLET 3 X /DAY WITH MEALS FOR DIABETES 270 tablet 3   Misc Natural Products (NEURIVA PO) Take 1 tablet by mouth daily.     mometasone (NASONEX) 50 MCG/ACT nasal spray Place 2 sprays into the nose daily. 1 each 2   omeprazole (PRILOSEC) 40 MG capsule Take one capsule daily for  indigestion and heartburn 90 capsule 3   ONETOUCH ULTRA test strip USE TO TEST SUGAR TWICE A DAY 100 strip 3   prednisoLONE acetate (PRED FORTE) 1 % ophthalmic suspension Place 1 drop into the left eye. Instill 1 drop into the left eye 4 times daily for 1 week, 3 times daily for 1 week, 2 times daily for 1 week, 1 time daily until seen by provider     promethazine-dextromethorphan (PROMETHAZINE-DM) 6.25-15 MG/5ML syrup Take 5 mLs by mouth 4 (four) times daily as needed for cough. 240 mL 1   Rhubarb (ESTROVEN MENOPAUSE RELIEF PO) Take 1 capsule by mouth daily.     rosuvastatin (CRESTOR) 20 MG tablet Take  1 tablet  Daily  for Cholesterol 90 tablet 3   No current facility-administered medications for this visit.     Review of Systems  Please see the history of present illness.    (+) Fatigue, shortness of breath (+) Precordial chest discomfort  All other systems reviewed and are otherwise negative except as noted above.  Physical Exam    Wt Readings from Last 3 Encounters:  09/18/22 192 lb (87.1 kg)  09/14/22 193 lb (87.5 kg)  08/27/22 189 lb 6.4 oz (85.9 kg)   IR:CVELF were no vitals filed for this visit.,There is no height or weight on file to calculate BMI.  Constitutional:      Appearance: Healthy appearance. Not in distress.  Neck:     Vascular: JVD normal.  Pulmonary:     Effort: Pulmonary effort is normal.     Breath sounds: No wheezing. No rales. Diminished in the bases Cardiovascular:     Normal rate. Regular rhythm. Normal S1. Normal S2.      Murmurs: There is no murmur.  Edema:    Peripheral edema absent.  Abdominal:     Palpations: Abdomen is soft non tender. There is no hepatomegaly.  Skin:    General: Skin is warm and dry.  Neurological:     General: No focal deficit present.     Mental Status: Alert and oriented to person, place and time.     Cranial Nerves: Cranial  nerves are intact.  EKG/LABS/Other Studies Reviewed    ECG personally reviewed by me today  -sinus rhythm with rate of 68 bpm and no acute changes or evidence of SVT consistent with previous EKG   Lab Results  Component Value Date   WBC 13.8 (H) 09/03/2022   HGB 13.5 09/03/2022   HCT 42.4 09/03/2022   MCV 83.3 09/03/2022   PLT 325 09/03/2022   Lab Results  Component Value Date   CREATININE 0.79 08/06/2022   BUN 14 08/06/2022   NA 139 08/06/2022   K 4.6 08/06/2022   CL 104 08/06/2022   CO2 27 08/06/2022   Lab Results  Component Value Date   ALT 13 04/21/2022   AST 16 04/21/2022   ALKPHOS 126 07/08/2017   BILITOT 0.3 04/21/2022   Lab Results  Component Value Date   CHOL 180 04/21/2022   HDL 94 04/21/2022   LDLCALC 73 04/21/2022   TRIG 57 04/21/2022   CHOLHDL 1.9 04/21/2022    Lab Results  Component Value Date   HGBA1C 6.8 (H) 09/03/2022    Assessment & Plan    1.  Chest pain: -Today patient reports no chest discomfort today but does endorse left precordial chest pain that occurred last week. -We will complete stress echo for further evaluation of chest discomfort and associated shortness of breath. -Nitrostat 0.4 mg as needed -Patient advised to follow-up at the ED if chest pain becomes intractable and not relieved with 2 doses of as needed nitroglycerin.  2.  Dyspnea on exertion: -Today patient reports shortness of breath occurred while walking up a steep hill at church. -She denies any shortness of breath at rest but does state that occasionally she feels very tired with very minimal activity. -On exam today she was euvolemic with trace lower extremity edema. -We will complete stress echo as noted above.  3.  History of SVT: -s/p SVT ablation by Dr. Lovena Le on 07/2022 that was successful.   -Recent ZIO monitor results with no sustained arrhythmias or pauses. -Continue Cardizem 300 mg daily -Today patient reports occasional bouts of palpitations but no sustained arrhythmia.  4.  Essential hypertension: -Patient's blood pressure today was  well-controlled at 122/76 -Patient advised to abstain from excess salt and to continue Cardizem regimen milligrams as noted above  5.  Hyperlipidemia: -Patient's last LDL cholesterol was 73 -Rosuvastatin 20 mg daily  Disposition: Follow-up with Donato Heinz, MD or APP as scheduled Shared Decision Making/Informed Consent The risks [chest pain, shortness of breath, cardiac arrhythmias, dizziness, blood pressure fluctuations, myocardial infarction, stroke/transient ischemic attack, and life-threatening complications (estimated to be 1 in 10,000)], benefits (risk stratification, diagnosing coronary artery disease, treatment guidance) and alternatives of a stress or dobutamine stress echocardiogram were discussed in detail with Ms. Harper and she agrees to proceed.   Medication Adjustments/Labs and Tests Ordered: Current medicines are reviewed at length with the patient today.  Concerns regarding medicines are outlined above.   Signed, Mable Fill, Marissa Nestle, NP 11/12/2022, 6:54 PM [facility]  Note:  This document was prepared using Dragon voice recognition software and may include unintentional dictation errors.

## 2022-11-13 ENCOUNTER — Encounter: Payer: Self-pay | Admitting: Nurse Practitioner

## 2022-11-13 ENCOUNTER — Ambulatory Visit: Payer: Medicare HMO | Attending: Nurse Practitioner | Admitting: Nurse Practitioner

## 2022-11-13 VITALS — BP 122/76 | HR 68 | Ht 63.0 in | Wt 192.0 lb

## 2022-11-13 DIAGNOSIS — I471 Supraventricular tachycardia, unspecified: Secondary | ICD-10-CM | POA: Diagnosis not present

## 2022-11-13 DIAGNOSIS — I10 Essential (primary) hypertension: Secondary | ICD-10-CM

## 2022-11-13 DIAGNOSIS — R0602 Shortness of breath: Secondary | ICD-10-CM | POA: Diagnosis not present

## 2022-11-13 DIAGNOSIS — R079 Chest pain, unspecified: Secondary | ICD-10-CM

## 2022-11-13 DIAGNOSIS — R0609 Other forms of dyspnea: Secondary | ICD-10-CM | POA: Diagnosis not present

## 2022-11-13 MED ORDER — NITROGLYCERIN 0.4 MG SL SUBL: 0.4000 mg | SUBLINGUAL_TABLET | SUBLINGUAL | 3 refills | Status: AC | PRN

## 2022-11-13 NOTE — Patient Instructions (Signed)
Medication Instructions:  Nitroglycerin 0.'4mg'$  take as needed for emergency chest pain *If you need a refill on your cardiac medications before your next appointment, please call your pharmacy*   Lab Work: None Ordered   Testing/Procedures: Your physician has requested that you have a stress echocardiogram. For further information please visit [email or web address]. Please follow instruction sheet as given.   Follow-Up: At [facility], you and your health needs are our priority.  As part of our continuing mission to provide you with exceptional heart care, we have created designated Provider Care Teams.  These Care Teams include your primary Cardiologist (physician) and Advanced Practice Providers (APPs -  Physician Assistants and Nurse Practitioners) who all work together to provide you with the care you need, when you need it.  We recommend signing up for the patient portal called "MyChart".  Sign up information is provided on this After Visit Summary.  MyChart is used to connect with patients for Virtual Visits (Telemedicine).  Patients are able to view lab/test results, encounter notes, upcoming appointments, etc.  Non-urgent messages can be sent to your provider as well.   To learn more about what you can do with MyChart, go to [email or web address].    Your next appointment:   Follow up as scheduled  The format for your next appointment:   In Person  Provider:   Donato Heinz, MD     Other Instructions   Important Information About Sugar

## 2022-11-15 NOTE — Addendum Note (Signed)
Addended by: Tempie Donning on: 11/15/2022 06:08 PM   Modules accepted: Orders

## 2022-11-17 DIAGNOSIS — J301 Allergic rhinitis due to pollen: Secondary | ICD-10-CM | POA: Diagnosis not present

## 2022-11-17 DIAGNOSIS — J3089 Other allergic rhinitis: Secondary | ICD-10-CM | POA: Diagnosis not present

## 2022-11-17 DIAGNOSIS — J3081 Allergic rhinitis due to animal (cat) (dog) hair and dander: Secondary | ICD-10-CM | POA: Diagnosis not present

## 2022-11-19 ENCOUNTER — Other Ambulatory Visit: Payer: Self-pay | Admitting: Nurse Practitioner

## 2022-11-25 DIAGNOSIS — J3089 Other allergic rhinitis: Secondary | ICD-10-CM | POA: Diagnosis not present

## 2022-11-25 DIAGNOSIS — J3081 Allergic rhinitis due to animal (cat) (dog) hair and dander: Secondary | ICD-10-CM | POA: Diagnosis not present

## 2022-11-25 DIAGNOSIS — J301 Allergic rhinitis due to pollen: Secondary | ICD-10-CM | POA: Diagnosis not present

## 2022-12-01 ENCOUNTER — Ambulatory Visit: Payer: Medicare HMO | Admitting: Cardiology

## 2022-12-03 ENCOUNTER — Telehealth (HOSPITAL_COMMUNITY): Payer: Self-pay | Admitting: *Deleted

## 2022-12-03 DIAGNOSIS — H401132 Primary open-angle glaucoma, bilateral, moderate stage: Secondary | ICD-10-CM | POA: Diagnosis not present

## 2022-12-03 DIAGNOSIS — H04123 Dry eye syndrome of bilateral lacrimal glands: Secondary | ICD-10-CM | POA: Diagnosis not present

## 2022-12-03 DIAGNOSIS — E119 Type 2 diabetes mellitus without complications: Secondary | ICD-10-CM | POA: Diagnosis not present

## 2022-12-03 DIAGNOSIS — H43823 Vitreomacular adhesion, bilateral: Secondary | ICD-10-CM | POA: Diagnosis not present

## 2022-12-03 DIAGNOSIS — H20012 Primary iridocyclitis, left eye: Secondary | ICD-10-CM | POA: Diagnosis not present

## 2022-12-03 DIAGNOSIS — Z961 Presence of intraocular lens: Secondary | ICD-10-CM | POA: Diagnosis not present

## 2022-12-03 LAB — HM DIABETES EYE EXAM

## 2022-12-03 NOTE — Telephone Encounter (Signed)
Per DPR left instructions on home voicemail for stress echo scheduled on 12/07/22.

## 2022-12-03 NOTE — Telephone Encounter (Signed)
Patient is returning call.  °

## 2022-12-07 ENCOUNTER — Ambulatory Visit (HOSPITAL_COMMUNITY): Payer: Medicare HMO | Attending: Cardiology

## 2022-12-07 ENCOUNTER — Ambulatory Visit (HOSPITAL_COMMUNITY): Payer: Medicare HMO

## 2022-12-07 DIAGNOSIS — R079 Chest pain, unspecified: Secondary | ICD-10-CM | POA: Diagnosis not present

## 2022-12-07 DIAGNOSIS — R0602 Shortness of breath: Secondary | ICD-10-CM | POA: Insufficient documentation

## 2022-12-07 MED ORDER — PERFLUTREN LIPID MICROSPHERE
1.0000 mL | INTRAVENOUS | Status: AC | PRN
  Administered 2022-12-07: 3 mL via INTRAVENOUS

## 2022-12-09 DIAGNOSIS — J301 Allergic rhinitis due to pollen: Secondary | ICD-10-CM | POA: Diagnosis not present

## 2022-12-09 DIAGNOSIS — J3089 Other allergic rhinitis: Secondary | ICD-10-CM | POA: Diagnosis not present

## 2022-12-13 NOTE — Progress Notes (Deleted)
Cardiology Office Note:    Date:  12/13/2022   ID:  Lauren Lopez, DOB 1951-07-06, MRN [id number]  PCP:  Unk Pinto, MD  Cardiologist:  Donato Heinz, MD  Electrophysiologist:  None   Referring MD: Unk Pinto, MD   No chief complaint on file.   History of Present Illness:    Lauren Lopez is a 72 y.o. female with a hx of hypertension, hyperlipidemia, T2DM, asthma, SVT who presents for follow-up.  She was admitted with asthma exacerbation in March 2023.  Cardiology was consulted as she was having frequent episodes of SVT.  Echocardiogram 02/23/2022 showed normal biventricular function, no significant valvular disease.  Zio patch x9 days showed reported atrial flutter (23% burden) but on review I think these episodes are more likely atrial tachycardia with average rate 125 bpm.  Referred to Dr. Lovena Le in EP, underwent successful ablation of atrial tachycardia 08/17/2022.  Zio patch x 14 days on 10/07/2022 showed 3 episodes of SVT, longest lasting 6 beats, no significant arrhythmias.  Stress echo on 12/07/2022 showed poor exercise capacity, no evidence of ischemia.  Since last clinic visit,  she reports that she is doing well.  Denies any chest pain, dyspnea, lightheadedness, syncope, or palpitations.  Does report she feels tired all the time.  Does report some ankle edema.  Brought home BP log, has been 110s to 130s over 60s.  Past Medical History:  Diagnosis Date   Acute asthma exacerbation 02/20/2022   Arthritis    "right hand, back" (04/03/2016)   Asthma    takes weekly allergy shots, none in 5-6 weeks due to knee surgery   Bilateral carpal tunnel syndrome    Chronic lower back pain    GERD (gastroesophageal reflux disease)    Hyperlipidemia    Hypertension    Multiple allergies    Pneumonia 03/2012   Positive PPD    "they told me it wasn't positive; I was allergic to the test itself"   Right rotator cuff tear 01/20/2013   Type II diabetes mellitus ([place])     Vasculitis ([place]) 04/03/2016   Vitamin deficiency    Wears dentures    upper   Wears glasses     Past Surgical History:  Procedure Laterality Date   ABDOMINAL HYSTERECTOMY     BILATERAL SALPINGOOPHORECTOMY  11/28/2010   open laparoscopy with adhesiolysis also   CARPAL TUNNEL RELEASE  07/15/2012   Procedure: CARPAL TUNNEL RELEASE;  Surgeon: Cammie Sickle., MD;  Location: [place];  Service: Orthopedics;  Laterality: Left;   CARPAL TUNNEL RELEASE Right 05/31/2014   Procedure: RIGHT CARPAL TUNNEL RELEASE;  Surgeon: Cammie Sickle, MD;  Location: [place];  Service: Orthopedics;  Laterality: Right;   COLONOSCOPY     KNEE ARTHROSCOPY Bilateral 2005-2016   "right-left"   SHOULDER ARTHROSCOPY WITH ROTATOR CUFF REPAIR AND SUBACROMIAL DECOMPRESSION Right 01/20/2013   Procedure: RIGHT ARTHROSCOPY SHOULDER DEBRIDEMENT LIMITED, ARTHROSCOPY SHOULDER DECOMPRESSION SUBACROMIAL PARTIAL ACROMIOPLASTY WITH CORACOACROMIAL RELEASE, ROTATOR CUFF REPAIR ;  Surgeon: Johnny Bridge, MD;  Location: [place];  Service: Orthopedics;  Laterality: Right;  RIGHT SHOULDER SCOPE DEBRIDEMENT, ACRIOMIOPLASTY, ROTATOR CUFF REPAIR   SVT ABLATION N/A 08/17/2022   Procedure: SVT ABLATION;  Surgeon: Evans Lance, MD;  Location: [place] [facility];  Service: Cardiovascular;  Laterality: N/A;   TOTAL KNEE ARTHROPLASTY Right 02/02/2022   Procedure: TOTAL KNEE ARTHROPLASTY;  Surgeon: Gaynelle Arabian, MD;  [facility]: [facility];  Service: Orthopedics;  Laterality: Right;   TUBAL LIGATION      Current Medications: No outpatient medications have been marked as taking for the 12/14/22 encounter (Appointment) with Donato Heinz, MD.     Allergies:   Metronidazole and Ppd [tuberculin purified protein derivative]   Social History   Socioeconomic History   Marital status: Married    Spouse name: Not on file   Number of children: Not on file   Years of education: Not  on file   Highest education level: Not on file  Occupational History   Occupation: retired  Tobacco Use   Smoking status: Never   Smokeless tobacco: Never  Vaping Use   Vaping Use: Never used  Substance and Sexual Activity   Alcohol use: No   Drug use: No   Sexual activity: Not Currently    Birth control/protection: Post-menopausal  Other Topics Concern   Not on file  Social History Narrative   Not on file   Social Determinants of Health   Financial Resource Strain: Not on file  Food Insecurity: Not on file  Transportation Needs: Not on file  Physical Activity: Not on file  Stress: Not on file  Social Connections: Not on file     Family History: The patient's family history includes Alzheimer's disease in her father; COPD in her brother; Cirrhosis in her brother; Dementia in her mother; Diabetes in her mother; Hypertension in her brother, brother, father, and mother; Tongue cancer in her sister.  ROS:   Please see the history of present illness.     All other systems reviewed and are negative.  EKGs/Labs/Other Studies Reviewed:    The following studies were reviewed today:   EKG:   03/06/22: Atrial tachycardia with block  Recent Labs: 02/20/2022: B Natriuretic Peptide 8.6 03/06/2022: TSH 2.050 04/21/2022: ALT 13; Magnesium 1.8 08/06/2022: BUN 14; Creatinine, Ser 0.79; Potassium 4.6; Sodium 139 09/03/2022: Hemoglobin 13.5; Platelets 325  Recent Lipid Panel    Component Value Date/Time   CHOL 180 04/21/2022 1608   TRIG 57 04/21/2022 1608   HDL 94 04/21/2022 1608   CHOLHDL 1.9 04/21/2022 1608   VLDL 28 07/08/2017 1515   LDLCALC 73 04/21/2022 1608    Physical Exam:    VS:  There were no vitals taken for this visit.    Wt Readings from Last 3 Encounters:  11/13/22 192 lb (87.1 kg)  09/18/22 192 lb (87.1 kg)  09/14/22 193 lb (87.5 kg)     GEN:  Well nourished, well developed in no acute distress HEENT: Normal NECK: No JVD; No carotid bruits CARDIAC:  irregular, normal rhythm, no murmurs, rubs, gallops RESPIRATORY:  Clear to auscultation without rales, wheezing or rhonchi  ABDOMEN: Soft, non-tender, non-distended MUSCULOSKELETAL:  No edema; No deformity  SKIN: Warm and dry NEUROLOGIC:  Alert and oriented x 3 PSYCHIATRIC:  Normal affect   ASSESSMENT:    No diagnosis found.   PLAN:    Atrial tachycardia: Having frequent runs of narrow complex tachycardia with rate up to 180s.  Referred to Dr. Lovena Le in EP, underwent successful ablation of atrial tachycardia 08/17/2022.  Zio patch x 14 days on 10/07/2022 showed 3 episodes of SVT, longest lasting 6 beats, no significant arrhythmias.   -Continue diltiazem 300 mg daily  Chest pain: Reported chest pain at clinic visit with Ambrose Pancoast, NP on 11/13/2022.  Stress echo was ordered which showed poor exercise capacity, no evidence of ischemia.  Hypertension: Continue diltiazem  Hyperlipidemia: On rosuvastatin 20 mg daily  Daytime  somnolence: Negative sleep study on 07/26/2022  RTC in***   Medication Adjustments/Labs and Tests Ordered: Current medicines are reviewed at length with the patient today.  Concerns regarding medicines are outlined above.  No orders of the defined types were placed in this encounter.  No orders of the defined types were placed in this encounter.   There are no Patient Instructions on file for this visit.   Signed, Donato Heinz, MD  12/13/2022 5:55 PM    [place]

## 2022-12-14 ENCOUNTER — Ambulatory Visit: Payer: Medicare HMO | Attending: Cardiology | Admitting: Cardiology

## 2022-12-15 DIAGNOSIS — J301 Allergic rhinitis due to pollen: Secondary | ICD-10-CM | POA: Diagnosis not present

## 2022-12-15 DIAGNOSIS — J3081 Allergic rhinitis due to animal (cat) (dog) hair and dander: Secondary | ICD-10-CM | POA: Diagnosis not present

## 2022-12-15 DIAGNOSIS — J3089 Other allergic rhinitis: Secondary | ICD-10-CM | POA: Diagnosis not present

## 2022-12-22 ENCOUNTER — Encounter: Payer: Self-pay | Admitting: Internal Medicine

## 2022-12-22 DIAGNOSIS — J301 Allergic rhinitis due to pollen: Secondary | ICD-10-CM | POA: Diagnosis not present

## 2022-12-22 DIAGNOSIS — J3081 Allergic rhinitis due to animal (cat) (dog) hair and dander: Secondary | ICD-10-CM | POA: Diagnosis not present

## 2022-12-22 DIAGNOSIS — J3089 Other allergic rhinitis: Secondary | ICD-10-CM | POA: Diagnosis not present

## 2022-12-29 DIAGNOSIS — J301 Allergic rhinitis due to pollen: Secondary | ICD-10-CM | POA: Diagnosis not present

## 2022-12-29 DIAGNOSIS — J3089 Other allergic rhinitis: Secondary | ICD-10-CM | POA: Diagnosis not present

## 2022-12-29 DIAGNOSIS — J3081 Allergic rhinitis due to animal (cat) (dog) hair and dander: Secondary | ICD-10-CM | POA: Diagnosis not present

## 2023-01-05 DIAGNOSIS — H43821 Vitreomacular adhesion, right eye: Secondary | ICD-10-CM | POA: Diagnosis not present

## 2023-01-05 DIAGNOSIS — E113412 Type 2 diabetes mellitus with severe nonproliferative diabetic retinopathy with macular edema, left eye: Secondary | ICD-10-CM | POA: Diagnosis not present

## 2023-01-05 DIAGNOSIS — E113291 Type 2 diabetes mellitus with mild nonproliferative diabetic retinopathy without macular edema, right eye: Secondary | ICD-10-CM | POA: Diagnosis not present

## 2023-01-05 DIAGNOSIS — H43822 Vitreomacular adhesion, left eye: Secondary | ICD-10-CM | POA: Diagnosis not present

## 2023-01-06 ENCOUNTER — Ambulatory Visit (INDEPENDENT_AMBULATORY_CARE_PROVIDER_SITE_OTHER): Payer: Medicare HMO | Admitting: Internal Medicine

## 2023-01-06 ENCOUNTER — Encounter: Payer: Self-pay | Admitting: Internal Medicine

## 2023-01-06 VITALS — BP 138/78 | HR 91 | Temp 97.9°F | Resp 17 | Ht 63.0 in | Wt 193.4 lb

## 2023-01-06 DIAGNOSIS — J3089 Other allergic rhinitis: Secondary | ICD-10-CM | POA: Diagnosis not present

## 2023-01-06 DIAGNOSIS — E1169 Type 2 diabetes mellitus with other specified complication: Secondary | ICD-10-CM | POA: Diagnosis not present

## 2023-01-06 DIAGNOSIS — Z79899 Other long term (current) drug therapy: Secondary | ICD-10-CM | POA: Diagnosis not present

## 2023-01-06 DIAGNOSIS — N182 Chronic kidney disease, stage 2 (mild): Secondary | ICD-10-CM

## 2023-01-06 DIAGNOSIS — I10 Essential (primary) hypertension: Secondary | ICD-10-CM

## 2023-01-06 DIAGNOSIS — E559 Vitamin D deficiency, unspecified: Secondary | ICD-10-CM

## 2023-01-06 DIAGNOSIS — D171 Benign lipomatous neoplasm of skin and subcutaneous tissue of trunk: Secondary | ICD-10-CM

## 2023-01-06 DIAGNOSIS — E785 Hyperlipidemia, unspecified: Secondary | ICD-10-CM

## 2023-01-06 DIAGNOSIS — E1122 Type 2 diabetes mellitus with diabetic chronic kidney disease: Secondary | ICD-10-CM

## 2023-01-06 DIAGNOSIS — J301 Allergic rhinitis due to pollen: Secondary | ICD-10-CM | POA: Diagnosis not present

## 2023-01-06 DIAGNOSIS — J3081 Allergic rhinitis due to animal (cat) (dog) hair and dander: Secondary | ICD-10-CM | POA: Diagnosis not present

## 2023-01-06 NOTE — Patient Instructions (Signed)

## 2023-01-06 NOTE — Progress Notes (Addendum)
Future Appointments  Date Time Provider Department  01/06/2023             6 mo ov 10:30 AM Unk Pinto, MD [facility]  02/25/2023  9:20 AM Donato Heinz, MD [facility]  04/22/2023            cpe  3:00 PM Alycia Rossetti, NP [facility]  07/29/2023           wellness  4:00 PM Darrol Jump, NP [facility]    History of Present Illness:       This very nice 72 y.o. DBF presents for 9 month follow up with HTN, HLD, Pre-Diabetes and Vitamin D Deficiency. Patient has GERD controlled with Diet & her Omeprazole.         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         Patient is treated for HTN  circa 2013 & BP has been controlled at home. Today's BP is at goal - 138/78.  In Sept 2023 , patient had an ablation by Dr  Lovena Le for Incessant SVT.  In Jan this year , she had a low risk or Negative Stress Echo thru Dr Newman Nickels office .   Patient has had no complaints of any cardiac type chest pain, palpitations, dyspnea Vertell Limber /PND, dizziness, claudication or dependent edema.        Hyperlipidemia is controlled with diet & meds. Patient denies myalgias or other med SE's. Last Lipids were at goal :  Lab Results  Component Value Date   CHOL 180 04/21/2022   HDL 94 04/21/2022   LDLCALC 73 04/21/2022   TRIG 57 04/21/2022   CHOLHDL 1.9 04/21/2022     Also, the patient has history of Morbid obesity (BMI 35+) and history of T2_NIDDM  w/CKD2  (July 2019 ) for which she was started on Metformin.  She denies any symptoms of reactive hypoglycemia, diabetic polys, paresthesias or visual blurring.  Last A1c was not at goal :  Lab Results  Component Value Date   HGBA1C 6.8 (H) 09/03/2022                                                      Further, the patient also has history of Vitamin D Deficiency ("24" /2019) and supplements vitamin D . Last vitamin D was very low :  Lab  Results  Component Value Date   VD25OH 36 04/21/2022     Current Outpatient Medications on File Prior to Visit  Medication Sig   ADVAIR HFA 230-21 MCG/ACT inhaler Use  2 Inhalations  2 x /day  15 minutes apart   2 x /day (every 12 hours)   blood glucose meter kit and supplies Test sugars twice daily. Dx E11.9   Cholecalciferol (VITAMIN D3) 250 MCG (10000 UT) capsule Take 10,000 Units by mouth daily.   diltiazem (CARDIZEM CD) 300 MG 24 hr capsule Take 1 capsule (300 mg total) by mouth daily.   docusate sodium (COLACE) 100 MG capsule Take 100 mg by mouth daily.   EPINEPHrine 0.3 mg/0.3 mL IJ SOAJ injection Inject 0.3 mg into the muscle as needed for anaphylaxis.   fexofenadine (ALLEGRA) 180 MG tablet Take 1 tablet (180 mg total) by mouth daily.  guaiFENesin (MUCINEX PO) Take by mouth.   latanoprost (XALATAN) 0.005 % ophthalmic solution Place 1 drop into both eyes at bedtime.   Magnesium 250 MG TABS Take 250 mg by mouth in the morning and at bedtime.   metFORMIN (GLUCOPHAGE) 500 MG tablet TAKE 1 TABLET 3 X /DAY WITH MEALS FOR DIABETES   Misc Natural Products (NEURIVA PO) Take 1 tablet by mouth daily.   mometasone (NASONEX) 50 MCG/ACT nasal spray PLACE 2 SPRAYS INTO THE NOSE DAILY.   nitroGLYCERIN (NITROSTAT) 0.4 MG SL tablet Place 1 tablet (0.4 mg total) under the tongue every 5 (five) minutes as needed for chest pain.   omeprazole (PRILOSEC) 40 MG capsule Take one capsule daily for indigestion and heartburn   Rhubarb (ESTROVEN MENOPAUSE RELIEF PO) Take 1 capsule by mouth daily.   rosuvastatin (CRESTOR) 20 MG tablet Take  1 tablet  Daily  for Cholesterol     Allergies  Allergen Reactions   Metronidazole Swelling    Coats tongue white and tongue swelling    Ppd [Tuberculin Purified Protein Derivative] Other (See Comments)    + PPD 2013 with NEG CXR 05/2012 Redness and severe swelling at injection site     PMHx:   Past Medical History:  Diagnosis Date   Acute asthma exacerbation  02/20/2022   Arthritis    "right hand, back" (04/03/2016)   Asthma    takes weekly allergy shots, none in 5-6 weeks due to knee surgery   Bilateral carpal tunnel syndrome    Chronic lower back pain    GERD (gastroesophageal reflux disease)    Hyperlipidemia    Hypertension    Multiple allergies    Pneumonia 03/2012   Positive PPD    "they told me it wasn't positive; I was allergic to the test itself"   Right rotator cuff tear 01/20/2013   Type II diabetes mellitus ([place])    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/03/2016   Vitamin deficiency    Wears dentures    upper   Wears glasses      Immunization History  Administered Date(s) Administered   Influenza, High Dose Seasonal PF 11/10/2017, 09/15/2018, 09/04/2019, 04/26/2020, 10/14/2021, 09/02/2022   Influenza 08/30/2020   PFIZER-SARS-COV-2 Vacc 12/22/2019, 01/12/2020, 04/26/2020, 04/01/2021   Pneumococcal -13 12/22/2018   Pneumococcal -23 02/04/2017, 04/25/2021, 10/27/2021   Pneumococcal-23 06/27/2013   Tdap 04/23/2012   Zoster, Live 07/31/2015     Past Surgical History:  Procedure Laterality Date   ABDOMINAL HYSTERECTOMY     BILATERAL SALPINGOOPHORECTOMY  11/28/2010   open laparoscopy with adhesiolysis also   CARPAL TUNNEL RELEASE  07/15/2012   Procedure: CARPAL TUNNEL RELEASE;  Surgeon: Cammie Sickle., MD;  Location: [place];  Service: Orthopedics;  Laterality: Left;   CARPAL TUNNEL RELEASE Right 05/31/2014   Procedure: RIGHT CARPAL TUNNEL RELEASE;  Surgeon: Cammie Sickle, MD;  Location: [place];  Service: Orthopedics;  Laterality: Right;   COLONOSCOPY     KNEE ARTHROSCOPY Bilateral 2005-2016   "right-left"   SHOULDER ARTHROSCOPY WITH ROTATOR CUFF REPAIR AND SUBACROMIAL DECOMPRESSION Right 01/20/2013   Procedure: RIGHT ARTHROSCOPY SHOULDER DEBRIDEMENT LIMITED, ARTHROSCOPY SHOULDER DECOMPRESSION SUBACROMIAL PARTIAL ACROMIOPLASTY WITH CORACOACROMIAL RELEASE, ROTATOR CUFF REPAIR ;  Surgeon: Johnny Bridge, MD;  Location: [place];  Service: Orthopedics;  Laterality: Right;  RIGHT SHOULDER SCOPE DEBRIDEMENT, ACRIOMIOPLASTY, ROTATOR CUFF REPAIR   SVT ABLATION N/A 08/17/2022   Procedure: SVT ABLATION;  Surgeon: Evans Lance, MD;  [facility];  Service: Cardiovascular;  Laterality: N/A;   TOTAL KNEE ARTHROPLASTY Right 02/02/2022   Procedure: TOTAL KNEE ARTHROPLASTY;  Surgeon: Gaynelle Arabian, MD;  [facility];  Service: Orthopedics;  Laterality: Right;   TUBAL LIGATION       FHx:    Reviewed / unchanged   SHx:    Reviewed / unchanged    Systems Review:  Constitutional: Denies fever, chills, wt changes, headaches, insomnia, fatigue, night sweats, change in appetite. Eyes: Denies redness, blurred vision, diplopia, discharge, itchy, watery eyes.  ENT: Denies discharge, congestion, post nasal drip, epistaxis, sore throat, earache, hearing loss, dental pain, tinnitus, vertigo, sinus pain, snoring.  CV: Denies chest pain, palpitations, irregular heartbeat, syncope, dyspnea, diaphoresis, orthopnea, PND, claudication or edema. Respiratory: denies cough, dyspnea, DOE, pleurisy, hoarseness, laryngitis, wheezing.  Gastrointestinal: Denies dysphagia, odynophagia, heartburn, reflux, water brash, abdominal pain or cramps, nausea, vomiting, bloating, diarrhea, constipation, hematemesis, melena, hematochezia  or hemorrhoids. Genitourinary: Denies dysuria, frequency, urgency, nocturia, hesitancy, discharge, hematuria or flank pain. Musculoskeletal: Denies arthralgias, myalgias, stiffness, jt. swelling, pain, limping or strain/sprain.  Skin: Denies pruritus, rash, hives, warts, acne, eczema or change in skin lesion(s). Neuro: No weakness, tremor, incoordination, spasms, paresthesia or pain. Psychiatric: Denies confusion, memory loss or sensory loss. Endo: Denies change in weight, skin or hair change.  Heme/Lymph: No excessive bleeding, bruising or enlarged lymph  nodes.   Physical Exam  BP 138/78   Pulse 91   Temp 97.9 F (36.6 C)   Resp 17   Ht '5\' 3"'$  (1.6 m)   Wt 193 lb 6.4 oz (87.7 kg)   SpO2 96%   BMI 34.26 kg/m   Appears  well nourished, well groomed  and in no distress.  Eyes: PERRLA, EOMs, conjunctiva no swelling or erythema. Sinuses: No frontal/maxillary tenderness ENT/Mouth: EAC's clear, TM's nl w/o erythema, bulging. Nares clear w/o erythema, swelling, exudates. Oropharynx clear without erythema or exudates. Oral hygiene is good. Tongue normal, non obstructing. Hearing intact.  Neck: Supple. Thyroid not palpable. Car 2+/2+ without bruits, nodes or JVD. Chest: Respirations nl with BS clear & equal w/o rales, rhonchi, wheezing or stridor.  Cor: Heart sounds normal w/ regular rate and rhythm without sig. murmurs, gallops, clicks or rubs. Peripheral pulses normal and equal  without edema.  Abdomen: Soft & bowel sounds normal. Non-tender w/o guarding, rebound, hernias, masses or organomegaly.  Lymphatics: Unremarkable.  Musculoskeletal: Full ROM all peripheral extremities, joint stability, 5/5 strength and normal gait.  Skin: Warm, dry without exposed rashes or ecchymosis apparent. Appears to have a lipoma of her upper back . Neuro: Cranial nerves intact, reflexes equal bilaterally. Sensory-motor testing grossly intact. Tendon reflexes grossly intact.  Pysch: Alert & oriented x 3.  Insight and judgement nl & appropriate. No ideations.   Assessment and Plan:  1. Essential hypertension  - Continue medication, monitor blood pressure at home.  - Continue DASH diet.  Reminder to go to the ER if any CP,  SOB, nausea, dizziness, severe HA, changes vision/speech.    - CBC with Differential/Platelet - COMPLETE METABOLIC PANEL WITH GFR - Magnesium - TSH  2. Hyperlipidemia associated with type 2 diabetes mellitus ([place])  - Continue diet/meds, exercise,& lifestyle modifications.  - Continue monitor periodic cholesterol/liver & renal  functions     - Lipid panel - TSH  3. Type 2 diabetes mellitus with stage 2 chronic kidney  disease, without long-term current use of insulin ([name])  - Continue diet, exercise  - Lifestyle modifications.  - Monitor appropriate  labs  - Hemoglobin A1c - Insulin, random - Urinalysis, Routine w reflex microscopic - Microalbumin / creatinine urine ratio  4. Vitamin D deficiency  - Continue supplementation    - VITAMIN D 25 Hydrox  5. Medication management  - CBC with Differential/Platelet - COMPLETE METABOLIC PANEL WITH GFR - Magnesium - Lipid panel - TSH - Hemoglobin A1c - Insulin, random - VITAMIN D 25 Hydroxy   6. Lipoma of Back   - Dermatology referral           Discussed  regular exercise, BP monitoring, weight control to achieve/maintain BMI less than 25 and discussed med and SE's. Recommended labs to assess /monitor clinical status .  I discussed the assessment and treatment plan with the patient. The patient was provided an opportunity to ask questions and all were answered. The patient agreed with the plan and demonstrated an understanding of the instructions.  I provided over 30 minutes of exam, counseling, chart review and  complex critical decision making.        The patient was advised to call back or seek an in-person evaluation if the symptoms worsen or if the condition fails to improve as anticipated.   Kirtland Bouchard, MD

## 2023-01-07 LAB — COMPLETE METABOLIC PANEL WITH GFR
AG Ratio: 1.8 (calc) (ref 1.0–2.5)
ALT: 14 U/L (ref 6–29)
AST: 15 U/L (ref 10–35)
Albumin: 4.4 g/dL (ref 3.6–5.1)
Alkaline phosphatase (APISO): 115 U/L (ref 37–153)
BUN: 11 mg/dL (ref 7–25)
CO2: 25 mmol/L (ref 20–32)
Calcium: 10.1 mg/dL (ref 8.6–10.4)
Chloride: 106 mmol/L (ref 98–110)
Creat: 0.81 mg/dL (ref 0.60–1.00)
Globulin: 2.5 g/dL (calc) (ref 1.9–3.7)
Glucose, Bld: 126 mg/dL — ABNORMAL HIGH (ref 65–99)
Potassium: 4.3 mmol/L (ref 3.5–5.3)
Sodium: 142 mmol/L (ref 135–146)
Total Bilirubin: 0.5 mg/dL (ref 0.2–1.2)
Total Protein: 6.9 g/dL (ref 6.1–8.1)
eGFR: 78 mL/min/{1.73_m2} (ref >=60)

## 2023-01-07 LAB — MICROALBUMIN / CREATININE URINE RATIO
Creatinine, Urine: 151 mg/dL (ref 20–275)
Microalb Creat Ratio: 3 mcg/mg creat (ref <30)
Microalb, Ur: 0.4 mg/dL

## 2023-01-07 LAB — HEMOGLOBIN A1C
Hgb A1c MFr Bld: 6.8 % of total Hgb — ABNORMAL HIGH (ref <5.7)
Mean Plasma Glucose: 148 mg/dL
eAG (mmol/L): 8.2 mmol/L

## 2023-01-07 LAB — CBC WITH DIFFERENTIAL/PLATELET
Absolute Monocytes: 360 cells/uL (ref 200–950)
Basophils Absolute: 31 cells/uL (ref 0–200)
Basophils Relative: 0.5 %
Eosinophils Absolute: 81 cells/uL (ref 15–500)
Eosinophils Relative: 1.3 %
HCT: 39.1 % (ref 35.0–45.0)
Hemoglobin: 12.2 g/dL (ref 11.7–15.5)
Lymphs Abs: 2734 cells/uL (ref 850–3900)
MCH: 26 pg — ABNORMAL LOW (ref 27.0–33.0)
MCHC: 31.2 g/dL — ABNORMAL LOW (ref 32.0–36.0)
MCV: 83.4 fL (ref 80.0–100.0)
MPV: 11.3 fL (ref 7.5–12.5)
Monocytes Relative: 5.8 %
Neutro Abs: 2995 cells/uL (ref 1500–7800)
Neutrophils Relative %: 48.3 %
Platelets: 308 10*3/uL (ref 140–400)
RBC: 4.69 10*6/uL (ref 3.80–5.10)
RDW: 13.9 % (ref 11.0–15.0)
Total Lymphocyte: 44.1 %
WBC: 6.2 10*3/uL (ref 3.8–10.8)

## 2023-01-07 LAB — LIPID PANEL
Cholesterol: 167 mg/dL (ref <200)
HDL: 88 mg/dL (ref >=50)
LDL Cholesterol (Calc): 65 mg/dL (calc)
Non-HDL Cholesterol (Calc): 79 mg/dL (calc) (ref <130)
Total CHOL/HDL Ratio: 1.9 (calc) (ref <5.0)
Triglycerides: 62 mg/dL (ref <150)

## 2023-01-07 LAB — URINALYSIS, ROUTINE W REFLEX MICROSCOPIC
Bilirubin Urine: NEGATIVE
Glucose, UA: NEGATIVE
Hgb urine dipstick: NEGATIVE
Ketones, ur: NEGATIVE
Leukocytes,Ua: NEGATIVE
Nitrite: NEGATIVE
Protein, ur: NEGATIVE
Specific Gravity, Urine: 1.02 (ref 1.001–1.035)
pH: 6.5 (ref 5.0–8.0)

## 2023-01-07 LAB — INSULIN, RANDOM: Insulin: 26.4 u[IU]/mL — ABNORMAL HIGH

## 2023-01-07 LAB — MAGNESIUM: Magnesium: 1.8 mg/dL (ref 1.5–2.5)

## 2023-01-07 LAB — VITAMIN D 25 HYDROXY (VIT D DEFICIENCY, FRACTURES): Vit D, 25-Hydroxy: 59 ng/mL (ref 30–100)

## 2023-01-07 LAB — TSH: TSH: 1.61 mIU/L (ref 0.40–4.50)

## 2023-01-07 NOTE — Progress Notes (Signed)
<><><><><><><><><><><><><><><><><><><><><><><><><><><><><><><><><> <><><><><><><><><><><><><><><><><><><><><><><><><><><><><><><><><> -   Test results slightly outside the reference range are not unusual. If there is anything important, I will review this with you,  otherwise it is considered normal test values.  If you have further questions,  please do not hesitate to contact me at the office or via My Chart.  <><><><><><><><><><><><><><><><><><><><><><><><><><><><><><><><><> <><><><><><><><><><><><><><><><><><><><><><><><><><><><><><><><><>  -  A1c = 6.8% -too high  - Ideal or goal is less than 6.0%   and   Best if normal below 5.7% - So Please work harder on diet & Weight  Loss    - Avoid Sweets, Candy & White Stuff   - White Rice, White [place], White Flour  - Breads &  Pasta <><><><><><><><><><><><><><><><><><><><><><><><><><><><><><><><><>  -   Magnesium  = 1.8  - and is  very  low       - goal is betw 2.0 - 2.5,   - So..............Marland Kitchen  Recommend that you take                                             Magnesium Chelate 500 mg tablet   2 / x /day with ,meals   - also important to eat lots of  leafy green vegetables   - spinach - Kale - collards - greens - okra - asparagus  - broccoli - quinoa - squash - almonds   - black, red, white beans  -  peas - green beans <><><><><><><><><><><><><><><><><><><><><><><><><><><><><><><><><>  -  Total   Chol = 167  / LDL Chol = 65  - Both   Excellent   - Very low risk for Heart Attack  / Stroke <><><><><><><><><><><><><><><><><><><><><><><><><><><><><><><><><>  -  Vitamin D = 59 - is better  ( was 36)   -  But - Vitamin D goal is between 70-100.   - Please make sure that you are taking your Vitamin D 10,000 units / day as directed.   - It is very important as a natural anti-inflammatory and helping the                                      immune system protect against viral infections, like the Covid-19    helping hair, skin,  and nails, as well as reducing stroke and heart attack risk.   - It helps your bones and helps with mood.  - It also decreases numerous cancer risks so please  take it as directed.   - Low Vit D is associated with a 200-300% higher risk for  CANCER   and 200-300% higher risk for HEART   ATTACK  &  STROKE.    - It is also associated with higher death rate at younger ages,   autoimmune diseases like Rheumatoid arthritis, Lupus, Multiple Sclerosis.     - Also many other serious conditions, like depression, Alzheimer's Dementia,   muscle aches, fatigue, fibromyalgia  <><><><><><><><><><><><><><><><><><><><><><><><><><><><><><><><><>  -  All Else - CBC - Kidneys - Electrolytes - Liver - Magnesium & Thyroid    - all  Normal / OK <><><><><><><><><><><><><><><><><><><><><><><><><><><><><><><><><> <><><><><><><><><><><><><><><><><><><><><><><><><><><><><><><><><>

## 2023-01-10 ENCOUNTER — Encounter: Payer: Self-pay | Admitting: Internal Medicine

## 2023-01-12 DIAGNOSIS — E113412 Type 2 diabetes mellitus with severe nonproliferative diabetic retinopathy with macular edema, left eye: Secondary | ICD-10-CM | POA: Diagnosis not present

## 2023-01-15 DIAGNOSIS — J301 Allergic rhinitis due to pollen: Secondary | ICD-10-CM | POA: Diagnosis not present

## 2023-01-15 DIAGNOSIS — J3081 Allergic rhinitis due to animal (cat) (dog) hair and dander: Secondary | ICD-10-CM | POA: Diagnosis not present

## 2023-01-15 DIAGNOSIS — J3089 Other allergic rhinitis: Secondary | ICD-10-CM | POA: Diagnosis not present

## 2023-01-18 ENCOUNTER — Ambulatory Visit: Payer: Medicare HMO | Admitting: Cardiology

## 2023-01-22 DIAGNOSIS — J3089 Other allergic rhinitis: Secondary | ICD-10-CM | POA: Diagnosis not present

## 2023-01-22 DIAGNOSIS — J3081 Allergic rhinitis due to animal (cat) (dog) hair and dander: Secondary | ICD-10-CM | POA: Diagnosis not present

## 2023-01-22 DIAGNOSIS — J301 Allergic rhinitis due to pollen: Secondary | ICD-10-CM | POA: Diagnosis not present

## 2023-01-26 ENCOUNTER — Other Ambulatory Visit: Payer: Self-pay | Admitting: Internal Medicine

## 2023-01-26 NOTE — Addendum Note (Signed)
Addended by: Unk Pinto on: 01/26/2023 12:40 PM   Modules accepted: Orders

## 2023-01-27 NOTE — Progress Notes (Signed)
Future Appointments  Date Time Provider Department  01/28/2023 11:30 AM Unk Pinto, MD [facility]  02/25/2023  9:20 AM Donato Heinz, MD [facility]  05/04/2023  2:00 PM Alycia Rossetti, NP [facility]  08/09/2023  2:30 PM Alycia Rossetti, NP [facility]  11/10/2023 11:30 AM Unk Pinto, MD [facility]    History of Present Illness:       This very nice 72 y.o. DBF with HTN, HLD, Pre-Diabetes , GERD and Vitamin D Deficiency presents with concerns re:  an abscess of her back which began draining several days ago . Patient is noted high normal BP today & hypertensive systems review is negative.    Current Outpatient Medications on File Prior to Visit  Medication Sig   ADVAIR HFA 230-21 MCG/ACT inhaler Use  2 Inhalations  2 x /day  15 minutes apart   2 x /day (every 12 hours)   blood glucose meter kit and supplies Test sugars twice daily. Dx E11.9   Cholecalciferol (VITAMIN D3) 250 MCG (10000 UT) capsule Take 10,000 Units by mouth daily.   diltiazem (CARDIZEM CD) 300 MG 24 hr capsule Take 1 capsule (300 mg total) by mouth daily.   docusate sodium (COLACE) 100 MG capsule Take 100 mg by mouth daily.   EPINEPHrine 0.3 mg/0.3 mL IJ SOAJ injection Inject 0.3 mg into the muscle as needed for anaphylaxis.   fexofenadine (ALLEGRA) 180 MG tablet Take 1 tablet (180 mg total) by mouth daily.   guaiFENesin (MUCINEX PO) Take by mouth.   Lancets (ONETOUCH DELICA PLUS 123XX123) MISC TEST SUGAR TWICE A DAY   latanoprost (XALATAN) 0.005 % ophthalmic solution Place 1 drop into both eyes at bedtime.   Magnesium 250 MG TABS Take 250 mg by mouth in the morning and at bedtime.   metFORMIN (GLUCOPHAGE) 500 MG tablet TAKE 1 TABLET 3 X /DAY WITH MEALS FOR DIABETES   Misc Natural Products (NEURIVA PO) Take 1 tablet by mouth daily.   mometasone (NASONEX) 50 MCG/ACT nasal spray PLACE 2 SPRAYS INTO THE NOSE DAILY.   nitroGLYCERIN (NITROSTAT) 0.4 MG SL tablet Place 1 tablet (0.4 mg total) under the  tongue every 5 (five) minutes as needed for chest pain.   omeprazole (PRILOSEC) 40 MG capsule Take one capsule daily for indigestion and heartburn   ONETOUCH ULTRA test strip USE TO TEST SUGAR TWICE A DAY   Rhubarb (ESTROVEN MENOPAUSE RELIEF PO) Take 1 capsule by mouth daily.   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 Redness and severe swelling at injection site     Problem list She has Essential hypertension; GERD (gastroesophageal reflux disease); Vitamin D deficiency; Asthma; Medication management; Osteoporosis; Obesity (BMI 30.0-34.9); Hyperlipidemia associated with type 2 diabetes mellitus ([place]); CKD stage 2 due to type 2 diabetes mellitus ([place]); Type 2 diabetes mellitus with stage 2 chronic kidney disease, without long-term current use of insulin ([place]); Lipoma of right upper extremity; Allergic rhinitis due to pollen; S/P total knee arthroplasty, right; Glaucoma; Prolonged QT interval; S/P knee replacement; SVT (supraventricular tachycardia); 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 02/20/2022; and Hepatic steatosis on their problem list.   Observations/Objective:  BP (!) 140/68   Pulse 80  Temp (!) 97 F (36.1 C)   Ht '5\' 3"'$  (1.6 m)   Wt 197 lb (89.4 kg)   SpO2 98%   BMI 34.90 kg/m   Skin focused exam finds a tender  3-4 cm subcutaneous mass of the upper left back about  T3/4 level  draining serosanguinous purulent  material   Procedure ( CPT :  10061 )      After informed consent and aseptic prep with Alcohol the area was infiltrated with 4 ml of Marcaine 0.5% for local anesthesia. Then with a #10 scalpel a transverse 2-3 cm incision was made toe expose a necrotic inflamed mass of scar tissue over a dehisced sebaceous cyst  abscess. Then with the #10  scalpel & scissors the necrotic was sharply excised piecemeal and the cyst cavity was exonerated And then packed with about 8 " of iodoform guaze . The wound was covered with a sterile dressing and sealed with a Tegaderm patch.   Patient was instructed in post op wound care  ( ie., no dressing changes) and to return here in 4-5 days for anticipated re-poacking & dressing change.  Assessment and Plan:   1. Essential hypertension  - BP is noted  high normal.   2. Abscess of back  - doxycycline  100 MG capsule;  Take 1 capsule 2 x /day with meals for Infection   Dispense: 60 capsule; Refill: 0  - traMADol 50 MG tablet;  Take 1/2 to 1 tablet every 4 hours as needed for pain   Dispense: 30 tablet; Refill: 0    Follow Up Instructions:        I discussed the assessment and treatment plan with the patient. The patient was provided an opportunity to ask questions and all were answered. The patient agreed with the plan and demonstrated an understanding of the instructions.       The patient was advised to call back or seek an in-person evaluation if the symptoms worsen or if the condition fails to improve as anticipated.    Kirtland Bouchard, MD

## 2023-01-28 ENCOUNTER — Encounter: Payer: Self-pay | Admitting: Internal Medicine

## 2023-01-28 ENCOUNTER — Ambulatory Visit (INDEPENDENT_AMBULATORY_CARE_PROVIDER_SITE_OTHER): Payer: Medicare HMO | Admitting: Internal Medicine

## 2023-01-28 VITALS — BP 140/68 | HR 80 | Temp 97.0°F | Ht 63.0 in | Wt 197.0 lb

## 2023-01-28 DIAGNOSIS — I10 Essential (primary) hypertension: Secondary | ICD-10-CM

## 2023-01-28 DIAGNOSIS — L02212 Cutaneous abscess of back [any part, except buttock]: Secondary | ICD-10-CM

## 2023-01-28 MED ORDER — TRAMADOL HCL 50 MG PO TABS: ORAL_TABLET | ORAL | 0 refills | Status: DC

## 2023-01-28 MED ORDER — DOXYCYCLINE HYCLATE 100 MG PO CAPS: ORAL_CAPSULE | ORAL | 0 refills | Status: DC

## 2023-01-28 NOTE — Patient Instructions (Signed)
Skin Abscess  A skin abscess is an infected area on or under your skin. It contains pus and other material. An abscess may also be called a furuncle, carbuncle, or boil. It is often the result of an infection caused by bacteria. An abscess can occur in or on almost any part of your body. Sometimes, an abscess may break open (rupture) on its own. In most cases, it will keep getting worse unless it is treated. An abscess can cause pain and make you feel ill. An untreated abscess can cause infection to spread to other parts of your body or your bloodstream. The abscess may need to be drained. You may also need to take antibiotics. What are the causes? An abscess occurs when germs, like bacteria, pass through your skin and cause an infection. This may be caused by: A scrape or cut on your skin. A puncture wound through your skin, such as a needle injection or insect bite. Blocked oil or sweat glands. Blocked and infected hair follicles. A fluid-filled sac that forms beneath your skin (sebaceous cyst) and becomes infected. What increases the risk? You may be more likely to develop an abscess if: You have problems with blood circulation, or you have a weak body defense system (immune system). You have diabetes. You have dry and irritated skin. You get injections often or use IV drugs. You have a foreign body in a wound, such as a splinter. You smoke or use tobacco products. What are the signs or symptoms? Symptoms of this condition include: A painful, firm bump under the skin. A bump with pus at the top. This may break through the skin and drain. Other symptoms include: Redness and swelling around the abscess. Warmth or tenderness. Swelling of the lymph nodes (glands) near the abscess. A sore on the skin. How is this diagnosed? This condition may be diagnosed based on a physical exam and your medical history. You may also have tests done, such as: A test of a sample of pus. This may be done  to find what is causing the infection. Blood tests. Imaging tests, such as an ultrasound, CT scan, or MRI. How is this treated? A small abscess that drains on its own may not need to be treated. Treatment for larger abscesses may include: Moist heat or a heat pack applied to the area a few times a day. Incision and drainage. This is a procedure to drain the abscess. Antibiotics. For a severe abscess, you may first get antibiotics through an IV and then change to antibiotics by mouth. Follow these instructions at home: Medicines Take over-the-counter and prescription medicines only as told by your provider. If you were prescribed antibiotics, take them as told by your provider. Do not stop using the antibiotic even if you start to feel better. Abscess care  If you have an abscess that has not drained, apply heat to the affected area. Use the heat source that your provider recommends, such as a moist heat pack or a heating pad. Place a towel between your skin and the heat source. Leave the heat on for 20-30 minutes at a time. If your skin turns bright red, remove the heat right away to prevent burns. The risk of burns is higher if you cannot feel pain, heat, or cold. Follow instructions from your provider about how to take care of your abscess. Make sure you: Cover the abscess with a bandage (dressing). Wash your hands with soap and water for at least 20 seconds before  and after you change the dressing or gauze. If soap and water are not available, use hand sanitizer. Change your dressing or gauze as told by your provider. Check your abscess every day for signs of an infection that is getting worse. Check for: More redness, swelling, pain, or tenderness. More fluid or blood. Warmth. More pus or a worse smell. General instructions To avoid spreading the infection: Do not share personal care items, towels, or hot tubs with others. Avoid making skin contact with other people. Be careful  when getting rid of used dressings, wound packing, or any drainage from the abscess. Do not use any products that contain nicotine or tobacco. These products include cigarettes, chewing tobacco, and vaping devices, such as e-cigarettes. If you need help quitting, ask your provider. Do not use any creams, ointments, or liquids unless you have been told to by your provider. Contact a health care provider if: You see redness that spreads quickly or red streaks on your skin spreading away from the abscess. You have any signs of worse infection at the abscess. You vomit every time you eat or drink. You have a fever, chills, or muscle aches. The cyst or abscess returns. Get help right away if: You have severe pain. You make less pee (urine) than normal. This information is not intended to replace advice given to you by your health care provider. Make sure you discuss any questions you have with your health care provider. Document Revised: 07/01/2022 Document Reviewed: 07/01/2022 Elsevier Patient Education  [place].

## 2023-02-01 ENCOUNTER — Ambulatory Visit (INDEPENDENT_AMBULATORY_CARE_PROVIDER_SITE_OTHER): Payer: Medicare HMO | Admitting: Internal Medicine

## 2023-02-01 ENCOUNTER — Encounter: Payer: Self-pay | Admitting: Internal Medicine

## 2023-02-01 VITALS — BP 140/80 | HR 90 | Temp 97.9°F | Resp 16 | Ht 63.0 in | Wt 195.4 lb

## 2023-02-01 DIAGNOSIS — M2550 Pain in unspecified joint: Secondary | ICD-10-CM | POA: Diagnosis not present

## 2023-02-01 DIAGNOSIS — L02212 Cutaneous abscess of back [any part, except buttock]: Secondary | ICD-10-CM | POA: Diagnosis not present

## 2023-02-01 DIAGNOSIS — M25559 Pain in unspecified hip: Secondary | ICD-10-CM | POA: Insufficient documentation

## 2023-02-01 NOTE — Progress Notes (Signed)
Future Appointments  Date Time Provider Department  02/04/2023  1:45 PM Unk Pinto, MD [facility]  02/25/2023  9:20 AM Donato Heinz, MD [facility]  05/04/2023  2:00 PM Alycia Rossetti, NP [facility]  08/09/2023  2:30 PM Alycia Rossetti, NP [facility]  11/10/2023 11:30 AM Unk Pinto, MD [facility]    History of Present Illness:    Patient is a very nice 72 yo BF returning for 4 day f/u post I&D of a large Abscess with evacuation of abscess contents & packing with Iodoform guaze.          Patient also relates that she saw her Opthalmologist Dr Midge Aver and was advised that she should have blood tests for Lupus to check her eyes. She does report aches om hands, /wrist, feet, knees & ankles.    BP (!) 140/80   Pulse 90   Temp 97.9 F (36.6 C)   Resp 16   Ht '5\' 3"'$  (1.6 m)   Wt 195 lb 6.4 oz (88.6 kg)   SpO2 97%   BMI 34.61 kg/m     Iodoform packing is removed & wound is filled with Betadine/Sugar compound and covered with guaze. Then covered & sealed with Tegaderm sterile dressing.    Current Outpatient Medications on File Prior to Visit  Medication Sig   blood glucose meter kit and supplies Test sugars twice daily. Dx E11.9   Cholecalciferol (VITAMIN D3) 250 MCG (10000 UT) capsule Take 10,000 Units by mouth daily.   diltiazem (CARDIZEM CD) 300 MG 24 hr capsule Take 1 capsule (300 mg total) by mouth daily.   docusate sodium (COLACE) 100 MG capsule Take 100 mg by mouth daily.   doxycycline (VIBRAMYCIN) 100 MG capsule Take 1 capsule 2 x /day with meals for Infection   EPINEPHrine 0.3 mg/0.3 mL IJ SOAJ injection Inject 0.3 mg into the muscle as needed for anaphylaxis.   fexofenadine (ALLEGRA) 180 MG tablet Take 1 tablet (180 mg total) by mouth daily.   guaiFENesin (MUCINEX PO) Take by mouth.   Lancets (ONETOUCH DELICA PLUS 123XX123) MISC TEST SUGAR TWICE A DAY   latanoprost (XALATAN) 0.005 % ophthalmic solution Place 1 drop into both eyes at bedtime.    Magnesium 250 MG TABS Take 250 mg by mouth in the morning and at bedtime.   metFORMIN (GLUCOPHAGE) 500 MG tablet TAKE 1 TABLET 3 X /DAY WITH MEALS FOR DIABETES   Misc Natural Products (NEURIVA PO) Take 1 tablet by mouth daily.   mometasone (NASONEX) 50 MCG/ACT nasal spray PLACE 2 SPRAYS INTO THE NOSE DAILY.   nitroGLYCERIN (NITROSTAT) 0.4 MG SL tablet Place 1 tablet (0.4 mg total) under the tongue every 5 (five) minutes as needed for chest pain.   omeprazole (PRILOSEC) 40 MG capsule Take one capsule daily for indigestion and heartburn   ONETOUCH ULTRA test strip USE TO TEST SUGAR TWICE A DAY   Rhubarb (ESTROVEN MENOPAUSE RELIEF PO) Take 1 capsule by mouth daily.   rosuvastatin (CRESTOR) 20 MG tablet Take  1 tablet  Daily  for Cholesterol   traMADol (ULTRAM) 50 MG tablet Take 1/2 to 1 tablet every 4 hours as needed for pain   ADVAIR HFA 230-21 MCG/ACT inhaler Use  2 Inhalations  2 x /day  15 minutes apart   2 x /day (every 12 hours) (Patient not taking: Reported on 01/28/2023)   No current facility-administered medications on file prior to visit.    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 Redness and severe swelling at injection site     Problem list She has Essential hypertension; GERD (gastroesophageal reflux disease); Vitamin D deficiency; Asthma; Medication management; Osteoporosis; Obesity (BMI 30.0-34.9); Hyperlipidemia associated with type 2 diabetes mellitus ([place]); CKD stage 2 due to type 2 diabetes mellitus ([place]); Type 2 diabetes mellitus with stage 2 chronic kidney disease, without long-term current use of insulin ([place]); Lipoma of right upper extremity; Allergic rhinitis due to pollen; S/P total knee arthroplasty, right; Glaucoma; Prolonged QT interval; S/P knee replacement; SVT (supraventricular tachycardia); Aortic arch  atherosclerosis ([name]) - mild per CTA 02/20/2022; Hepatic steatosis; and Arthralgia of hip on their problem list.   Observations/Objective:  BP (!) 140/80   Pulse 90   Temp 97.9 F (36.6 C)   Resp 16   Ht '5\' 3"'$  (1.6 m)   Wt 195 lb 6.4 oz (88.6 kg)   SpO2 97%   BMI 34.61 kg/m   HEENT - WNL. Neck - supple.  Chest - Clear equal BS. Cor - Nl HS. RRR w/o sig MGR. PP 1(+). No edema. MS- FROM w/o deformities.  Gait Nl. Neuro -  Nl w/o focal abnormalities.   Assessment and Plan:   1. Arthralgia, unspecified joint  - Anti-Smith antibody - Sedimentation rate - C-reactive protein  2. Abscess of back  - Dressing change  - ROV 3-4 days for dressing change   Follow Up Instructions:        I discussed the assessment and treatment plan with the patient. The patient was provided an opportunity to ask questions and all were answered. The patient agreed with the plan and demonstrated an understanding of the instructions.       The patient was advised to call back or seek an in-person evaluation if the symptoms worsen or if the condition fails to improve as anticipated.    Kirtland Bouchard, MD

## 2023-02-02 ENCOUNTER — Ambulatory Visit: Payer: Medicare HMO | Admitting: Internal Medicine

## 2023-02-02 DIAGNOSIS — J3081 Allergic rhinitis due to animal (cat) (dog) hair and dander: Secondary | ICD-10-CM | POA: Diagnosis not present

## 2023-02-02 DIAGNOSIS — J301 Allergic rhinitis due to pollen: Secondary | ICD-10-CM | POA: Diagnosis not present

## 2023-02-02 DIAGNOSIS — J3089 Other allergic rhinitis: Secondary | ICD-10-CM | POA: Diagnosis not present

## 2023-02-02 LAB — SEDIMENTATION RATE: Sed Rate: 9 mm/h (ref 0–30)

## 2023-02-02 LAB — ANTI-SMITH ANTIBODY: ENA SM Ab Ser-aCnc: <1 AI

## 2023-02-02 LAB — C-REACTIVE PROTEIN: CRP: 1.9 mg/L (ref <8.0)

## 2023-02-03 NOTE — Progress Notes (Signed)
<><><><><><><><><><><><><><><><><><><><><><><><><><><><><><><><><> <><><><><><><><><><><><><><><><><><><><><><><><><><><><><><><><><>  - "  Anti-Smith " Tests requested by Dr Katy Fitch to rule out Lupus Are all Negative & OK   <><><><><><><><><><><><><><><><><><><><><><><><><><><><><><><><><> <><><><><><><><><><><><><><><><><><><><><><><><><><><><><><><><><>-

## 2023-02-04 ENCOUNTER — Ambulatory Visit: Payer: Medicare HMO | Admitting: Internal Medicine

## 2023-02-04 ENCOUNTER — Encounter: Payer: Self-pay | Admitting: Internal Medicine

## 2023-02-04 VITALS — BP 140/82 | HR 90 | Temp 97.9°F | Resp 16 | Ht 63.0 in | Wt 195.0 lb

## 2023-02-04 DIAGNOSIS — L02212 Cutaneous abscess of back [any part, except buttock]: Secondary | ICD-10-CM

## 2023-02-04 NOTE — Progress Notes (Signed)
Future Appointments  Date Time Provider Department  02/04/2023  1:45 PM Unk Pinto, MD [facility]  02/25/2023  9:20 AM Donato Heinz, MD [facility]  05/04/2023  2:00 PM Alycia Rossetti, NP [facility]  08/09/2023  2:30 PM Alycia Rossetti, NP [facility]  11/10/2023 11:30 AM Unk Pinto, MD [facility]    History of Present Illness:        Current Outpatient Medications on File Prior to Visit  Medication Sig   ADVAIR HFA 230-21 MCG/ACT inhaler Use  2 Inhalations  2 x /day  15 minutes apart   2 x /day (every 12 hours) (Patient not taking: Reported on 01/28/2023)   Cholecalciferol (VITAMIN D3) 250 MCG (10000 UT) capsule Take 10,000 Units by mouth daily.   diltiazem (CARDIZEM CD) 300 MG 24 hr capsule Take 1 capsule (300 mg total) by mouth daily.   docusate sodium (COLACE) 100 MG capsule Take 100 mg by mouth daily.   doxycycline (VIBRAMYCIN) 100 MG capsule Take 1 capsule 2 x /day with meals for Infection   EPINEPHrine 0.3 mg/0.3 mL IJ SOAJ injection Inject 0.3 mg into the muscle as needed for anaphylaxis.   fexofenadine (ALLEGRA) 180 MG tablet Take 1 tablet (180 mg total) by mouth daily.   guaiFENesin (MUCINEX PO) Take by mouth.   latanoprost (XALATAN) 0.005 % ophthalmic solution Place 1 drop into both eyes at bedtime.   Magnesium 250 MG TABS Take 250 mg by mouth in the morning and at bedtime.   metFORMIN (GLUCOPHAGE) 500 MG tablet TAKE 1 TABLET 3 X /DAY WITH MEALS FOR DIABETES   Misc Natural Products (NEURIVA PO) Take 1 tablet by mouth daily.   mometasone (NASONEX) 50 MCG/ACT nasal spray PLACE 2 SPRAYS INTO THE NOSE DAILY.   nitroGLYCERIN (NITROSTAT) 0.4 MG SL tablet Place 1 tablet (0.4 mg total) under the tongue every 5 (five) minutes as needed for chest pain.   omeprazole (PRILOSEC) 40 MG capsule Take one capsule daily for indigestion and heartburn   Rhubarb (ESTROVEN MENOPAUSE RELIEF PO) Take 1 capsule by mouth daily.   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 Redness and severe swelling at injection site     Problem list She has Essential hypertension; GERD (gastroesophageal reflux disease); Vitamin D deficiency; Asthma; Medication management; Osteoporosis; Obesity (BMI 30.0-34.9); Hyperlipidemia associated with type 2 diabetes mellitus ([place]); CKD stage 2 due to type 2 diabetes mellitus ([place]); Type 2 diabetes mellitus with stage 2 chronic kidney disease, without long-term current use of insulin ([place]); Lipoma of right upper extremity; Allergic rhinitis due to pollen; S/P total knee arthroplasty, right; Glaucoma; Prolonged QT interval; S/P knee replacement; SVT (supraventricular tachycardia); 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 02/20/2022; Hepatic steatosis; and Arthralgia of hip on their problem list.   Observations/Objective:   Wound of back appears clean. Iodoform packing removed & Betadine-Sugar packed into wound and occlusive dressing applied.    Assessment and Plan:  Abscess of back, s/p  I&D.   Follow Up Instructions:        I discussed the assessment and treatment plan with the  patient. The patient was provided an opportunity to ask questions and all were answered. The patient agreed with the plan and demonstrated an understanding of the instructions.       The patient was advised to call back or seek an in-person evaluation if the symptoms worsen or if the condition fails to improve as anticipated.    Kirtland Bouchard, MD

## 2023-02-08 ENCOUNTER — Encounter: Payer: Self-pay | Admitting: Internal Medicine

## 2023-02-09 DIAGNOSIS — J301 Allergic rhinitis due to pollen: Secondary | ICD-10-CM | POA: Diagnosis not present

## 2023-02-09 DIAGNOSIS — J3081 Allergic rhinitis due to animal (cat) (dog) hair and dander: Secondary | ICD-10-CM | POA: Diagnosis not present

## 2023-02-09 DIAGNOSIS — J3089 Other allergic rhinitis: Secondary | ICD-10-CM | POA: Diagnosis not present

## 2023-02-15 DIAGNOSIS — J3089 Other allergic rhinitis: Secondary | ICD-10-CM | POA: Diagnosis not present

## 2023-02-15 DIAGNOSIS — J3081 Allergic rhinitis due to animal (cat) (dog) hair and dander: Secondary | ICD-10-CM | POA: Diagnosis not present

## 2023-02-15 DIAGNOSIS — J301 Allergic rhinitis due to pollen: Secondary | ICD-10-CM | POA: Diagnosis not present

## 2023-02-16 DIAGNOSIS — H43823 Vitreomacular adhesion, bilateral: Secondary | ICD-10-CM | POA: Diagnosis not present

## 2023-02-16 DIAGNOSIS — E113412 Type 2 diabetes mellitus with severe nonproliferative diabetic retinopathy with macular edema, left eye: Secondary | ICD-10-CM | POA: Diagnosis not present

## 2023-02-16 DIAGNOSIS — H401130 Primary open-angle glaucoma, bilateral, stage unspecified: Secondary | ICD-10-CM | POA: Diagnosis not present

## 2023-02-16 DIAGNOSIS — E113291 Type 2 diabetes mellitus with mild nonproliferative diabetic retinopathy without macular edema, right eye: Secondary | ICD-10-CM | POA: Diagnosis not present

## 2023-02-23 DIAGNOSIS — J3081 Allergic rhinitis due to animal (cat) (dog) hair and dander: Secondary | ICD-10-CM | POA: Diagnosis not present

## 2023-02-23 DIAGNOSIS — J301 Allergic rhinitis due to pollen: Secondary | ICD-10-CM | POA: Diagnosis not present

## 2023-02-23 DIAGNOSIS — J3089 Other allergic rhinitis: Secondary | ICD-10-CM | POA: Diagnosis not present

## 2023-02-23 NOTE — Progress Notes (Deleted)
Cardiology Office Note:    Date:  06/12/2022   ID:  Lauren Lopez, DOB 11/07/1951, MRN [id number]  PCP:  Unk Pinto, MD  Cardiologist:  Donato Heinz, MD  Electrophysiologist:  None   Referring MD: Unk Pinto, MD   Chief Complaint  Patient presents with   SVT    History of Present Illness:    Lauren Lopez is a 72 y.o. female with a hx of hypertension, hyperlipidemia, T2DM, asthma, SVT who presents for follow-up.  She was admitted with asthma exacerbation in March 2023.  Cardiology was consulted as she was having frequent episodes of SVT.  Echocardiogram 02/23/2022 showed normal biventricular function, no significant valvular disease.  Zio patch x9 days showed reported atrial flutter (23% burden) but on review I think these episodes are more likely atrial tachycardia with average rate 125 bpm.  She was seen by Dr. Lovena Le and underwent EP study and catheter ablation of incessant atrial tachycardia on 08/17/2022; no inducible SVT following ablation.  Zio patch x 14 days 08/2022 showed 3 episodes of SVT with longest lasting 6 beats.  Since last clinic visit,  she reports that she is doing well.  Denies any chest pain, dyspnea, lightheadedness, syncope, or palpitations.  Does report she feels tired all the time.  Does report some ankle edema.  Brought home BP log, has been 110s to 130s over 60s.  Past Medical History:  Diagnosis Date   Acute asthma exacerbation 02/20/2022   Arthritis    "right hand, back" (04/03/2016)   Asthma    takes weekly allergy shots, none in 5-6 weeks due to knee surgery   Bilateral carpal tunnel syndrome    Chronic lower back pain    GERD (gastroesophageal reflux disease)    Hyperlipidemia    Hypertension    Multiple allergies    Pneumonia 03/2012   Positive PPD    "they told me it wasn't positive; I was allergic to the test itself"   Right rotator cuff tear 01/20/2013   Type II diabetes mellitus ([place])    Vasculitis ([place])  04/03/2016   Vitamin deficiency    Wears dentures    upper   Wears glasses     Past Surgical History:  Procedure Laterality Date   ABDOMINAL HYSTERECTOMY     BILATERAL SALPINGOOPHORECTOMY  11/28/2010   open laparoscopy with adhesiolysis also   CARPAL TUNNEL RELEASE  07/15/2012   Procedure: CARPAL TUNNEL RELEASE;  Surgeon: Cammie Sickle., MD;  Location: [place];  Service: Orthopedics;  Laterality: Left;   CARPAL TUNNEL RELEASE Right 05/31/2014   Procedure: RIGHT CARPAL TUNNEL RELEASE;  Surgeon: Cammie Sickle, MD;  Location: [place];  Service: Orthopedics;  Laterality: Right;   COLONOSCOPY     KNEE ARTHROSCOPY Bilateral 2005-2016   "right-left"   SHOULDER ARTHROSCOPY WITH ROTATOR CUFF REPAIR AND SUBACROMIAL DECOMPRESSION Right 01/20/2013   Procedure: RIGHT ARTHROSCOPY SHOULDER DEBRIDEMENT LIMITED, ARTHROSCOPY SHOULDER DECOMPRESSION SUBACROMIAL PARTIAL ACROMIOPLASTY WITH CORACOACROMIAL RELEASE, ROTATOR CUFF REPAIR ;  Surgeon: Johnny Bridge, MD;  Location: [place];  Service: Orthopedics;  Laterality: Right;  RIGHT SHOULDER SCOPE DEBRIDEMENT, ACRIOMIOPLASTY, ROTATOR CUFF REPAIR   TOTAL KNEE ARTHROPLASTY Right 02/02/2022   Procedure: TOTAL KNEE ARTHROPLASTY;  Surgeon: Gaynelle Arabian, MD;  Location: [facility];  Service: Orthopedics;  Laterality: Right;   TUBAL LIGATION      Current Medications: Current Meds  Medication Sig   ADVAIR HFA 230-21 MCG/ACT inhaler Inhale 2 puffs  into the lungs 2 (two) times daily.   blood glucose meter kit and supplies Test sugars twice daily. Dx E11.9   diltiazem (CARDIZEM CD) 300 MG 24 hr capsule Take 1 capsule (300 mg total) by mouth daily.   EPINEPHrine 0.3 mg/0.3 mL IJ SOAJ injection    fexofenadine (ALLEGRA) 180 MG tablet Take 1 tablet (180 mg total) by mouth daily.   glucose blood (ONETOUCH ULTRA) test strip USE TO TEST SUGAR TWICE A DAY   Lancets (ONETOUCH DELICA PLUS 123XX123) MISC TEST SUGAR  TWICE A DAY   latanoprost (XALATAN) 0.005 % ophthalmic solution Place 1 drop into both eyes at bedtime.   Magnesium 250 MG TABS Take 250 mg by mouth in the morning and at bedtime.   metFORMIN (GLUCOPHAGE) 500 MG tablet Take 1 tab twice daily with food for diabetes. Do not increase dose due to causing diarrhea.   methocarbamol (ROBAXIN) 500 MG tablet Take 1 tablet (500 mg total) by mouth every 6 (six) hours as needed for muscle spasms.   Misc Natural Products (NEURIVA PO) Take by mouth.   mometasone (NASONEX) 50 MCG/ACT nasal spray Place 2 sprays into the nose daily as needed (allergies).   omeprazole (PRILOSEC) 40 MG capsule Take one capsule daily for indigestion and heartburn   Rhubarb (ESTROVEN MENOPAUSE RELIEF PO) Take 1 capsule by mouth daily.   rosuvastatin (CRESTOR) 20 MG tablet Take  1 tablet  Daily  for Cholesterol   Vitamin D, Ergocalciferol, (DRISDOL) 1.25 MG (50000 UNIT) CAPS capsule Take 50,000 Units by mouth every 7 (seven) days.     Allergies:   Metronidazole and Ppd [tuberculin purified protein derivative]   Social History   Socioeconomic History   Marital status: Married    Spouse name: Not on file   Number of children: Not on file   Years of education: Not on file   Highest education level: Not on file  Occupational History   Occupation: retired  Tobacco Use   Smoking status: Never   Smokeless tobacco: Never  Vaping Use   Vaping Use: Never used  Substance and Sexual Activity   Alcohol use: No   Drug use: No   Sexual activity: Not Currently    Birth control/protection: Post-menopausal  Other Topics Concern   Not on file  Social History Narrative   Not on file   Social Determinants of Health   Financial Resource Strain: Not on file  Food Insecurity: Not on file  Transportation Needs: Not on file  Physical Activity: Not on file  Stress: Not on file  Social Connections: Not on file     Family History: The patient's family history includes Alzheimer's  disease in her father; COPD in her brother; Cirrhosis in her brother; Dementia in her mother; Diabetes in her mother; Hypertension in her brother, brother, father, and mother; Tongue cancer in her sister.  ROS:   Please see the history of present illness.     All other systems reviewed and are negative.  EKGs/Labs/Other Studies Reviewed:    The following studies were reviewed today:   EKG:   03/06/22: Atrial tachycardia with block  Recent Labs: 02/20/2022: B Natriuretic Peptide 8.6 03/06/2022: TSH 2.050 04/21/2022: ALT 13; BUN 8; Creat 0.76; Hemoglobin 11.5; Magnesium 1.8; Platelets 307; Potassium 4.0; Sodium 144  Recent Lipid Panel    Component Value Date/Time   CHOL 180 04/21/2022 1608   TRIG 57 04/21/2022 1608   HDL 94 04/21/2022 1608   CHOLHDL 1.9 04/21/2022 1608  VLDL 28 07/08/2017 1515   LDLCALC 73 04/21/2022 1608    Physical Exam:    VS:  BP 130/70 (BP Location: Left Arm)   Pulse 76   Ht 5\' 3"  (1.6 m)   Wt 190 lb 12.8 oz (86.5 kg)   SpO2 96%   BMI 33.80 kg/m     Wt Readings from Last 3 Encounters:  06/12/22 190 lb 12.8 oz (86.5 kg)  04/21/22 188 lb (85.3 kg)  04/20/22 188 lb (85.3 kg)     GEN:  Well nourished, well developed in no acute distress HEENT: Normal NECK: No JVD; No carotid bruits CARDIAC: irregular, normal rhythm, no murmurs, rubs, gallops RESPIRATORY:  Clear to auscultation without rales, wheezing or rhonchi  ABDOMEN: Soft, non-tender, non-distended MUSCULOSKELETAL:  No edema; No deformity  SKIN: Warm and dry NEUROLOGIC:  Alert and oriented x 3 PSYCHIATRIC:  Normal affect   ASSESSMENT:    1. SVT (supraventricular tachycardia) ([place])   2. Daytime somnolence     PLAN:    SVT: Having runs of narrow complex tachycardia with rate up to 180s.  Differential includes AVNRT, AT or 2:1 atrial flutter.  No clear flutter waves seen.   Echocardiogram shows normal biventricular function, no significant valvular disease.  Normal TSH/free T4 02/2022.  She  was seen by Dr. Lovena Le and underwent EP study and catheter ablation of incessant atrial tachycardia on 08/17/2022; no inducible SVT following ablation.  Zio patch x 14 days 08/2022 showed 3 episodes of SVT with longest lasting 6 beats. -Continue diltiazem 300 mg daily -Follows with Dr. Lovena Le in EP  Chest pain: Reported chest pain at appointment with Ambrose Pancoast, NP on 11/13/22.  Stress echo was done on 12/08/2022, which showed poor exercise capacity (4.6 METS) though did reach target heart rate and no evidence of ischemia seen; did have frequent PVCs at peak exercise  Daytime somnolence: Sleep study 06/2022 showed no OSA  RTC in 6 months   Medication Adjustments/Labs and Tests Ordered: Current medicines are reviewed at length with the patient today.  Concerns regarding medicines are outlined above.  Orders Placed This Encounter  Procedures   Split night study   No orders of the defined types were placed in this encounter.   Patient Instructions  Medication Instructions:  Your physician recommends that you continue on your current medications as directed. Please refer to the Current Medication list given to you today.  *If you need a refill on your cardiac medications before your next appointment, please call your pharmacy*  Testing/Procedures: Your physician has recommended that you have a sleep study. This test records several body functions during sleep, including: brain activity, eye movement, oxygen and carbon dioxide blood levels, heart rate and rhythm, breathing rate and rhythm, the flow of air through your mouth and nose, snoring, body muscle movements, and chest and belly movement.  Follow-Up: At [facility], you and your health needs are our priority.  As part of our continuing mission to provide you with exceptional heart care, we have created designated Provider Care Teams.  These Care Teams include your primary Cardiologist (physician) and Advanced Practice Providers (APPs -   Physician Assistants and Nurse Practitioners) who all work together to provide you with the care you need, when you need it.  We recommend signing up for the patient portal called "MyChart".  Sign up information is provided on this After Visit Summary.  MyChart is used to connect with patients for Virtual Visits (Telemedicine).  Patients are able to  view lab/test results, encounter notes, upcoming appointments, etc.  Non-urgent messages can be sent to your provider as well.   To learn more about what you can do with MyChart, go to [email or web address].    Your next appointment:   6 month(s)  The format for your next appointment:   In Person  Provider:   Donato Heinz, MD {   Important Information About Sugar         Signed, Donato Heinz, MD  06/12/2022 9:41 AM    [place]

## 2023-02-25 ENCOUNTER — Other Ambulatory Visit: Payer: Self-pay | Admitting: Cardiology

## 2023-02-25 ENCOUNTER — Ambulatory Visit (HOSPITAL_BASED_OUTPATIENT_CLINIC_OR_DEPARTMENT_OTHER): Payer: Medicare HMO | Admitting: Cardiology

## 2023-03-02 DIAGNOSIS — J3081 Allergic rhinitis due to animal (cat) (dog) hair and dander: Secondary | ICD-10-CM | POA: Diagnosis not present

## 2023-03-02 DIAGNOSIS — J301 Allergic rhinitis due to pollen: Secondary | ICD-10-CM | POA: Diagnosis not present

## 2023-03-02 DIAGNOSIS — J3089 Other allergic rhinitis: Secondary | ICD-10-CM | POA: Diagnosis not present

## 2023-03-08 DIAGNOSIS — J3081 Allergic rhinitis due to animal (cat) (dog) hair and dander: Secondary | ICD-10-CM | POA: Diagnosis not present

## 2023-03-08 DIAGNOSIS — J3089 Other allergic rhinitis: Secondary | ICD-10-CM | POA: Diagnosis not present

## 2023-03-08 DIAGNOSIS — J301 Allergic rhinitis due to pollen: Secondary | ICD-10-CM | POA: Diagnosis not present

## 2023-03-10 DIAGNOSIS — H33022 Retinal detachment with multiple breaks, left eye: Secondary | ICD-10-CM | POA: Diagnosis not present

## 2023-03-10 DIAGNOSIS — H33322 Round hole, left eye: Secondary | ICD-10-CM | POA: Diagnosis not present

## 2023-03-10 DIAGNOSIS — H43822 Vitreomacular adhesion, left eye: Secondary | ICD-10-CM | POA: Diagnosis not present

## 2023-03-11 LAB — HM DIABETES EYE EXAM

## 2023-03-18 DIAGNOSIS — H43823 Vitreomacular adhesion, bilateral: Secondary | ICD-10-CM | POA: Diagnosis not present

## 2023-03-18 DIAGNOSIS — H401131 Primary open-angle glaucoma, bilateral, mild stage: Secondary | ICD-10-CM | POA: Diagnosis not present

## 2023-03-18 DIAGNOSIS — E113291 Type 2 diabetes mellitus with mild nonproliferative diabetic retinopathy without macular edema, right eye: Secondary | ICD-10-CM | POA: Diagnosis not present

## 2023-03-18 LAB — HM DIABETES EYE EXAM

## 2023-03-19 DIAGNOSIS — J3089 Other allergic rhinitis: Secondary | ICD-10-CM | POA: Diagnosis not present

## 2023-03-19 DIAGNOSIS — J3081 Allergic rhinitis due to animal (cat) (dog) hair and dander: Secondary | ICD-10-CM | POA: Diagnosis not present

## 2023-03-19 DIAGNOSIS — J301 Allergic rhinitis due to pollen: Secondary | ICD-10-CM | POA: Diagnosis not present

## 2023-03-24 DIAGNOSIS — H33021 Retinal detachment with multiple breaks, right eye: Secondary | ICD-10-CM | POA: Diagnosis not present

## 2023-03-24 DIAGNOSIS — H33321 Round hole, right eye: Secondary | ICD-10-CM | POA: Diagnosis not present

## 2023-03-24 DIAGNOSIS — H43821 Vitreomacular adhesion, right eye: Secondary | ICD-10-CM | POA: Diagnosis not present

## 2023-03-24 DIAGNOSIS — H547 Unspecified visual loss: Secondary | ICD-10-CM | POA: Diagnosis not present

## 2023-04-01 DIAGNOSIS — H401131 Primary open-angle glaucoma, bilateral, mild stage: Secondary | ICD-10-CM | POA: Diagnosis not present

## 2023-04-01 DIAGNOSIS — J301 Allergic rhinitis due to pollen: Secondary | ICD-10-CM | POA: Diagnosis not present

## 2023-04-01 DIAGNOSIS — E113412 Type 2 diabetes mellitus with severe nonproliferative diabetic retinopathy with macular edema, left eye: Secondary | ICD-10-CM | POA: Diagnosis not present

## 2023-04-01 DIAGNOSIS — H35352 Cystoid macular degeneration, left eye: Secondary | ICD-10-CM | POA: Diagnosis not present

## 2023-04-01 DIAGNOSIS — E113291 Type 2 diabetes mellitus with mild nonproliferative diabetic retinopathy without macular edema, right eye: Secondary | ICD-10-CM | POA: Diagnosis not present

## 2023-04-01 DIAGNOSIS — J3081 Allergic rhinitis due to animal (cat) (dog) hair and dander: Secondary | ICD-10-CM | POA: Diagnosis not present

## 2023-04-01 DIAGNOSIS — J3089 Other allergic rhinitis: Secondary | ICD-10-CM | POA: Diagnosis not present

## 2023-04-09 DIAGNOSIS — J301 Allergic rhinitis due to pollen: Secondary | ICD-10-CM | POA: Diagnosis not present

## 2023-04-09 DIAGNOSIS — J3089 Other allergic rhinitis: Secondary | ICD-10-CM | POA: Diagnosis not present

## 2023-04-09 DIAGNOSIS — J3081 Allergic rhinitis due to animal (cat) (dog) hair and dander: Secondary | ICD-10-CM | POA: Diagnosis not present

## 2023-04-14 DIAGNOSIS — J3081 Allergic rhinitis due to animal (cat) (dog) hair and dander: Secondary | ICD-10-CM | POA: Diagnosis not present

## 2023-04-14 DIAGNOSIS — J301 Allergic rhinitis due to pollen: Secondary | ICD-10-CM | POA: Diagnosis not present

## 2023-04-14 DIAGNOSIS — J3089 Other allergic rhinitis: Secondary | ICD-10-CM | POA: Diagnosis not present

## 2023-04-15 DIAGNOSIS — H43821 Vitreomacular adhesion, right eye: Secondary | ICD-10-CM | POA: Diagnosis not present

## 2023-04-15 DIAGNOSIS — E113412 Type 2 diabetes mellitus with severe nonproliferative diabetic retinopathy with macular edema, left eye: Secondary | ICD-10-CM | POA: Diagnosis not present

## 2023-04-15 DIAGNOSIS — H401131 Primary open-angle glaucoma, bilateral, mild stage: Secondary | ICD-10-CM | POA: Diagnosis not present

## 2023-04-15 DIAGNOSIS — E113291 Type 2 diabetes mellitus with mild nonproliferative diabetic retinopathy without macular edema, right eye: Secondary | ICD-10-CM | POA: Diagnosis not present

## 2023-04-15 DIAGNOSIS — H35352 Cystoid macular degeneration, left eye: Secondary | ICD-10-CM | POA: Diagnosis not present

## 2023-04-22 ENCOUNTER — Encounter: Payer: Medicare HMO | Admitting: Nurse Practitioner

## 2023-04-22 DIAGNOSIS — J3089 Other allergic rhinitis: Secondary | ICD-10-CM | POA: Diagnosis not present

## 2023-04-22 DIAGNOSIS — J301 Allergic rhinitis due to pollen: Secondary | ICD-10-CM | POA: Diagnosis not present

## 2023-04-29 ENCOUNTER — Ambulatory Visit: Payer: Medicare HMO | Attending: Cardiology | Admitting: Cardiology

## 2023-04-29 ENCOUNTER — Encounter: Payer: Self-pay | Admitting: Cardiology

## 2023-04-29 VITALS — BP 142/74 | HR 63 | Ht 63.0 in | Wt 196.2 lb

## 2023-04-29 DIAGNOSIS — I471 Supraventricular tachycardia, unspecified: Secondary | ICD-10-CM

## 2023-04-29 DIAGNOSIS — E785 Hyperlipidemia, unspecified: Secondary | ICD-10-CM

## 2023-04-29 DIAGNOSIS — I10 Essential (primary) hypertension: Secondary | ICD-10-CM | POA: Diagnosis not present

## 2023-04-29 DIAGNOSIS — J3089 Other allergic rhinitis: Secondary | ICD-10-CM | POA: Diagnosis not present

## 2023-04-29 DIAGNOSIS — R079 Chest pain, unspecified: Secondary | ICD-10-CM | POA: Diagnosis not present

## 2023-04-29 DIAGNOSIS — J3081 Allergic rhinitis due to animal (cat) (dog) hair and dander: Secondary | ICD-10-CM | POA: Diagnosis not present

## 2023-04-29 DIAGNOSIS — J301 Allergic rhinitis due to pollen: Secondary | ICD-10-CM | POA: Diagnosis not present

## 2023-04-29 NOTE — Patient Instructions (Signed)
Medication Instructions:  Your physician recommends that you continue on your current medications as directed. Please refer to the Current Medication list given to you today.   *If you need a refill on your cardiac medications before your next appointment, please call your pharmacy*  Follow-Up: At [facility], you and your health needs are our priority.  As part of our continuing mission to provide you with exceptional heart care, we have created designated Provider Care Teams.  These Care Teams include your primary Cardiologist (physician) and Advanced Practice Providers (APPs -  Physician Assistants and Nurse Practitioners) who all work together to provide you with the care you need, when you need it.  We recommend signing up for the patient portal called "MyChart".  Sign up information is provided on this After Visit Summary.  MyChart is used to connect with patients for Virtual Visits (Telemedicine).  Patients are able to view lab/test results, encounter notes, upcoming appointments, etc.  Non-urgent messages can be sent to your provider as well.   To learn more about what you can do with MyChart, go to [email or web address].    Your next appointment:   6 month(s)  Provider:   Little Ishikawa, MD      Please check your blood pressure at home twice daily, write it down.  Call the office or send message via Mychart with the readings in 1 week for Dr. Bjorn Pippin to review.

## 2023-04-29 NOTE — Progress Notes (Signed)
Cardiology Office Note:    Date:  04/29/2023   ID:  Lauren Lopez, DOB Jul 10, 1951, MRN [id number]  PCP:  Lucky Cowboy, MD  Cardiologist:  Little Ishikawa, MD  Electrophysiologist:  None   Referring MD: Lucky Cowboy, MD   Chief Complaint  Patient presents with   Palpitations    History of Present Illness:    Lauren Lopez is a 72 y.o. female with a hx of hypertension, hyperlipidemia, T2DM, asthma, SVT who presents for follow-up.  She was admitted with asthma exacerbation in March 2023.  Cardiology was consulted as she was having frequent episodes of SVT.  Echocardiogram 02/23/2022 showed normal biventricular function, no significant valvular disease.  Zio patch x9 days showed reported atrial flutter (23% burden) but on review I think these episodes are more likely atrial tachycardia with average rate 125 bpm.  She was seen by Dr. Ladona Ridgel and felt to be likely SVT, underwent ablation 07/2022.  Zio patch x 14 days 08/2022 showed very brief runs (less than 4 seconds) of SVT.  Stress echo 12/08/2022 showed no evidence of ischemia but poor exercise capacity (4.6 METS) and frequent PVCs during exercise.  Since last clinic visit, she reports she is doing well.  States that she was having some palpitations a few nights ago but short duration.  This was the first episode of palpitations she has had in a while.  She does report rare chest pain, which she states occurs less than once per month.  Describes as sharp pain, lasts for few seconds and resolves.  Reports BP in 120s when she checks at home.   Past Medical History:  Diagnosis Date   Acute asthma exacerbation 02/20/2022   Arthritis    "right hand, back" (04/03/2016)   Asthma    takes weekly allergy shots, none in 5-6 weeks due to knee surgery   Bilateral carpal tunnel syndrome    Chronic lower back pain    GERD (gastroesophageal reflux disease)    Hyperlipidemia    Hypertension    Multiple allergies    Pneumonia 03/2012    Positive PPD    "they told me it wasn't positive; I was allergic to the test itself"   Right rotator cuff tear 01/20/2013   Type II diabetes mellitus ([name])    Vasculitis ([name]) 04/03/2016   Vitamin deficiency    Wears dentures    upper   Wears glasses     Past Surgical History:  Procedure Laterality Date   ABDOMINAL HYSTERECTOMY     BILATERAL SALPINGOOPHORECTOMY  11/28/2010   open laparoscopy with adhesiolysis also   CARPAL TUNNEL RELEASE  07/15/2012   Procedure: CARPAL TUNNEL RELEASE;  Surgeon: Wyn Forster., MD;  Location: [facility];  Service: Orthopedics;  Laterality: Left;   CARPAL TUNNEL RELEASE Right 05/31/2014   Procedure: RIGHT CARPAL TUNNEL RELEASE;  Surgeon: Wyn Forster, MD;  Location: [facility];  Service: Orthopedics;  Laterality: Right;   COLONOSCOPY     KNEE ARTHROSCOPY Bilateral 2005-2016   "right-left"   SHOULDER ARTHROSCOPY WITH ROTATOR CUFF REPAIR AND SUBACROMIAL DECOMPRESSION Right 01/20/2013   Procedure: RIGHT ARTHROSCOPY SHOULDER DEBRIDEMENT LIMITED, ARTHROSCOPY SHOULDER DECOMPRESSION SUBACROMIAL PARTIAL ACROMIOPLASTY WITH CORACOACROMIAL RELEASE, ROTATOR CUFF REPAIR ;  Surgeon: Eulas Post, MD;  Location: [facility];  Service: Orthopedics;  Laterality: Right;  RIGHT SHOULDER SCOPE DEBRIDEMENT, ACRIOMIOPLASTY, ROTATOR CUFF REPAIR   SVT ABLATION N/A 08/17/2022   Procedure: SVT ABLATION;  Surgeon: Lewayne Bunting  W, MD;  Location: [facility];  Service: Cardiovascular;  Laterality: N/A;   TOTAL KNEE ARTHROPLASTY Right 02/02/2022   Procedure: TOTAL KNEE ARTHROPLASTY;  Surgeon: Ollen Gross, MD;  Location: [facility];  Service: Orthopedics;  Laterality: Right;   TUBAL LIGATION      Current Medications: Current Meds  Medication Sig   ADVAIR HFA 230-21 MCG/ACT inhaler Use  2 Inhalations  2 x /day  15 minutes apart   2 x /day (every 12 hours)   blood glucose meter kit and supplies Test sugars twice daily. Dx  E11.9   Cholecalciferol (VITAMIN D3) 250 MCG (10000 UT) capsule Take 10,000 Units by mouth daily.   diltiazem (CARDIZEM CD) 300 MG 24 hr capsule TAKE 1 CAPSULE BY MOUTH EVERY DAY   docusate sodium (COLACE) 100 MG capsule Take 100 mg by mouth daily.   EPINEPHrine 0.3 mg/0.3 mL IJ SOAJ injection Inject 0.3 mg into the muscle as needed for anaphylaxis.   fexofenadine (ALLEGRA) 180 MG tablet Take 1 tablet (180 mg total) by mouth daily.   guaiFENesin (MUCINEX PO) Take by mouth.   ketorolac (ACULAR) 0.5 % ophthalmic solution Place 1 drop into both eyes 4 (four) times daily.   Lancets (ONETOUCH DELICA PLUS LANCET33G) MISC TEST SUGAR TWICE A DAY   latanoprost (XALATAN) 0.005 % ophthalmic solution Place 1 drop into both eyes at bedtime.   Magnesium 250 MG TABS Take 250 mg by mouth in the morning and at bedtime.   metFORMIN (GLUCOPHAGE) 500 MG tablet TAKE 1 TABLET 3 X /DAY WITH MEALS FOR DIABETES   Misc Natural Products (NEURIVA PO) Take 1 tablet by mouth daily.   mometasone (NASONEX) 50 MCG/ACT nasal spray PLACE 2 SPRAYS INTO THE NOSE DAILY.   omeprazole (PRILOSEC) 40 MG capsule Take one capsule daily for indigestion and heartburn   ONETOUCH ULTRA test strip USE TO TEST SUGAR TWICE A DAY   rosuvastatin (CRESTOR) 20 MG tablet Take  1 tablet  Daily  for Cholesterol     Allergies:   Metronidazole and Ppd [tuberculin purified protein derivative]   Social History   Socioeconomic History   Marital status: Married    Spouse name: Not on file   Number of children: Not on file   Years of education: Not on file   Highest education level: Not on file  Occupational History   Occupation: retired  Tobacco Use   Smoking status: Never   Smokeless tobacco: Never  Vaping Use   Vaping Use: Never used  Substance and Sexual Activity   Alcohol use: No   Drug use: No   Sexual activity: Not Currently    Birth control/protection: Post-menopausal  Other Topics Concern   Not on file  Social History Narrative    Not on file   Social Determinants of Health   Financial Resource Strain: Not on file  Food Insecurity: Not on file  Transportation Needs: Not on file  Physical Activity: Not on file  Stress: Not on file  Social Connections: Not on file     Family History: The patient's family history includes Alzheimer's disease in her father; COPD in her brother; Cirrhosis in her brother; Dementia in her mother; Diabetes in her mother; Hypertension in her brother, brother, father, and mother; Tongue cancer in her sister.  ROS:   Please see the history of present illness.     All other systems reviewed and are negative.  EKGs/Labs/Other Studies Reviewed:    The following studies were reviewed today:  EKG:   03/06/22: Atrial tachycardia with block  Recent Labs: 01/06/2023: ALT 14; BUN 11; Creat 0.81; Hemoglobin 12.2; Magnesium 1.8; Platelets 308; Potassium 4.3; Sodium 142; TSH 1.61  Recent Lipid Panel    Component Value Date/Time   CHOL 167 01/06/2023 0000   TRIG 62 01/06/2023 0000   HDL 88 01/06/2023 0000   CHOLHDL 1.9 01/06/2023 0000   VLDL 28 07/08/2017 1515   LDLCALC 65 01/06/2023 0000    Physical Exam:    VS:  BP (!) 142/74 (BP Location: Left Arm, Patient Position: Sitting, Cuff Size: Normal)   Pulse 63   Ht 5\' 3"  (1.6 m)   Wt 196 lb 3.2 oz (89 kg)   SpO2 97%   BMI 34.76 kg/m     Wt Readings from Last 3 Encounters:  04/29/23 196 lb 3.2 oz (89 kg)  02/04/23 195 lb (88.5 kg)  02/01/23 195 lb 6.4 oz (88.6 kg)     GEN:  Well nourished, well developed in no acute distress HEENT: Normal NECK: No JVD; No carotid bruits CARDIAC: irregular, normal rhythm, no murmurs, rubs, gallops RESPIRATORY:  Clear to auscultation without rales, wheezing or rhonchi  ABDOMEN: Soft, non-tender, non-distended MUSCULOSKELETAL:  No edema; No deformity  SKIN: Warm and dry NEUROLOGIC:  Alert and oriented x 3 PSYCHIATRIC:  Normal affect   ASSESSMENT:    1. SVT (supraventricular tachycardia)    2. Essential hypertension   3. Chest pain of uncertain etiology   4. Hyperlipidemia, unspecified hyperlipidemia type     PLAN:    SVT: Was having runs of narrow complex tachycardia with rate up to 180s.  Zio patch x9 days showed reported atrial flutter (23% burden) but on review I think these episodes are more likely atrial tachycardia with average rate 125 bpm.  She was seen by Dr. Ladona Ridgel and felt to be likely SVT, underwent ablation 07/2022.  Zio patch x 14 days 08/2022 showed very brief runs (less than 4 seconds) of SVT.    Chest pain: Atypical in description. Stress echo 12/08/2022 showed no evidence of ischemia but poor exercise capacity (4.6 METS) and frequent PVCs during exercise. -Reports chest pain occurring rarely, and description (sharp pain lasting few seconds) suggests noncardiac chest pain  Hypertension: Continue diltiazem 300 mg daily.  Mildly elevated in clinic today reports has been under good control when she checks at home.  Asked to check BP twice daily for 1 week and let us know results  Hyperlipidemia: Continue rosuvastatin 20 mg daily.  LDL 65 on 01/06/23  Daytime somnolence: Sleep study 06/2022 showed no significant OSA  RTC in 6 months   Medication Adjustments/Labs and Tests Ordered: Current medicines are reviewed at length with the patient today.  Concerns regarding medicines are outlined above.  No orders of the defined types were placed in this encounter.  No orders of the defined types were placed in this encounter.   Patient Instructions  Medication Instructions:  Your physician recommends that you continue on your current medications as directed. Please refer to the Current Medication list given to you today.   *If you need a refill on your cardiac medications before your next appointment, please call your pharmacy*  Follow-Up: At [facility], you and your health needs are our priority.  As part of our continuing mission to provide you with  exceptional heart care, we have created designated Provider Care Teams.  These Care Teams include your primary Cardiologist (physician) and Advanced Practice Providers (APPs -  Physician  Assistants and Nurse Practitioners) who all work together to provide you with the care you need, when you need it.  We recommend signing up for the patient portal called "MyChart".  Sign up information is provided on this After Visit Summary.  MyChart is used to connect with patients for Virtual Visits (Telemedicine).  Patients are able to view lab/test results, encounter notes, upcoming appointments, etc.  Non-urgent messages can be sent to your provider as well.   To learn more about what you can do with MyChart, go to [email or web address].    Your next appointment:   6 month(s)  Provider:   Little Ishikawa, MD      Please check your blood pressure at home twice daily, write it down.  Call the office or send message via Mychart with the readings in 1 week for Dr. Bjorn Pippin to review.      Signed, Little Ishikawa, MD  04/29/2023 2:28 PM    [facility]

## 2023-05-03 NOTE — Progress Notes (Unsigned)
Encounter for Annual Physical Exam   Assessment:   Encounter for Annual Physical Exam with abnormal findings Due annually  Health Maintenance reviewed Healthy lifestyle reviewed and goals set  SVT (supraventricular tachycardia) ([name]) Had ablation and is doing ok on Cardizem 300 mg daily Continue to follow with cardiology Follow up sooner if atypical fatigue, CP, dyspnea, palpitations, edema, dizziness, syncope/LOC  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 02/20/2022 Control blood pressure, cholesterol, glucose, increase exercise.   Essential hypertension - continue medications, DASH diet, exercise and monitor at home. Call if greater than 130/80.  - Mild elevation and cardiology, Dr. Bjorn Pippin is following and may add another medication in the next week - CBC - CMP  Type 2 diabetes mellitus with CKD, without long-term current use of insulin ([name]) Discussed general issues about diabetes pathophysiology and management., Educational material distributed., Suggested low cholesterol diet., Encouraged aerobic exercise., Discussed foot care., Reminded to get yearly retinal exam. -     Hemoglobin A1c  Mixed hyperlipidemia associated with T2DM ([name]) Controlled on Rosuvastatin, continue medication decrease fatty foods increase activity. -     Lipid panel  Medication management -     Magnesium  Vitamin D deficiency Continue Vit D supplementation to maintain value in therapeutic level of 60-100  - Vit D  Age related osteoporosis, unspecified pathological fracture presence - on fosamax x 06/2017- 12/2019 - DEXA 06/2021 T score improved to osteopenia -1.4 - Continue high calcium diet, vit D supplement - Repeat DEXA ordered, will schedule with mammogram at [facility]  Obesity - BMI 33 - follow up 3 months for progress monitoring - increase veggies, decrease carbs - long discussion about weight loss, diet, and exercise  Hepatic steatosis (mild per CTA 02/20/2022) Weight loss with  low processed carbohydrate diet recommended Limit tylenol/alcohol Monitor LFT trend and follow up US if trending up  Gastroesophageal reflux disease, esophagitis presence not specified Continue PPI/H2 blocker, diet discussed  Mild intermittent asthma [place] allergy/asthma doing well with current regimen   Lipoma of right upper extremity Monitor; refer to gen surgery if needed  Hot flashes Has failed lexapro, paxil, effexor; GYN stopped estrogen; she is doing well with estroven complete. 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 Dione Booze Control blood sugars  Medication management -     CBC with Differential/Platelet -     COMPLETE METABOLIC PANEL WITH GFR -     Magnesium -     Lipid panel -     TSH -     Hemoglobin A1c -     VITAMIN D 25 Hydroxy (Vit-D Deficiency, Fractures) -     [name], RETROPERITNL ABD,  LTD -     Urinalysis, Routine w reflex microscopic -     Microalbumin / creatinine urine ratio   Screening for hematuria or proteinuria -     Urinalysis, Routine w reflex microscopic -     Microalbumin / creatinine urine ratio  Screening for thyroid disorder -     TSH  Screening for AAA (abdominal aortic aneurysm) - U/S ABD Retroperitoneal LTD        Over 40  minutes of exam, counseling, chart review, and critical decision making was performed  Future Appointments  Date Time Provider Department Center  08/09/2023  2:30 PM Raynelle Dick, NP [facility] None  11/02/2023  1:20 PM Little Ishikawa, MD [facility]  None  11/10/2023 11:30 AM Lucky Cowboy, MD [facility] None  05/03/2024  2:00 PM Raynelle Dick, NP [facility] None    Plan:   During the course of the visit the patient was educated and counseled about appropriate screening and preventive services including:   Pneumococcal vaccine  Prevnar 13 Influenza vaccine Td vaccine Screening electrocardiogram Bone densitometry screening Colorectal cancer screening Diabetes screening Glaucoma screening Nutrition counseling  Advanced directives: requested   Subjective:  Lauren Lopez is a 72 y.o. AA female who presents for CPE and 3 month follow up. She has Essential hypertension; GERD (gastroesophageal reflux disease); Vitamin D deficiency; Asthma; Medication management; Osteoporosis; Obesity (BMI 30.0-34.9); Hyperlipidemia associated with type 2 diabetes mellitus ([name]); CKD stage 2 due to type 2 diabetes mellitus ([name]); Type 2 diabetes mellitus with stage 2 chronic kidney disease, without long-term current use of insulin ([name]); Lipoma of right upper extremity; Allergic rhinitis due to pollen; S/P total knee arthroplasty, right; Glaucoma; Prolonged QT interval; S/P knee replacement; SVT (supraventricular tachycardia); 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 02/20/2022; Hepatic steatosis; and Arthralgia of hip on their problem list.  She is married, 2 grown daughter, 2 grandchildren live in [place], [place]. She is retired Producer, television/film/video associated. Husband has leukemia, they are monitoring it, he also has dementia as well.   History of R TKA by Dr. Lequita Halt in 01/2022, doing well.  She is s/p TAH, did have follow up with GYN. She does have  hot flashes, was on estrogen s/p hysterectomy but GYN tapered off; having sx day and night could not tolerate the lexapro, paxil, effexor, has been on estroven complete with benefit.   She follows with [place] allergy/asthma, on symbicort and reports well controlled, takes allegra.   She is following  closely with Dr. Luciana Axe for vitreomacular adhesion. She also has open angle glaucoma bilaterally.  Diabetic retinopathy bilaterally and macular edema left eye.  Had retinal detachment left eye and had surgery.   BMI is Body mass index is 36 kg/m., she has been working on diet and exercise Wt Readings from Last 3 Encounters:  05/04/23 196 lb 12.8 oz (89.3 kg)  04/29/23 196 lb 3.2 oz (89 kg)  02/04/23 195 lb (88.5 kg)   She follows with cardiology Dr. Jerene Pitch and recently referred to Dr. Ladona Ridgel due to some SVT seen while admitted in early 2023 for asthma exacerbation. Has been on cardizem 300 mg, HR frequently up to 120-150 but will resolve quickly/not persistent, patient monitors with apple watch, denies sx. She is s/p ablation by Dr. Ladona Ridgel 07/2022. Was seen by Robin Searing NP at cardiology 10/2022 Continues on Cardizem 300 mg. Last appt was with Dr. Bjorn Pippin 04/29/23 and no changes were made.   Her blood pressure has been controlled at home running 130's/70's ,has elevations when goes to doctors, currently monitoring twice daily for Dr. Jerene Pitch and to call results to his office, today their BP is BP: 138/70 BP Readings from Last 3 Encounters:  05/04/23 138/70  04/29/23 (!) 142/74  02/04/23 (!) 140/82  She does workout.  She denies chest pain, shortness of breath, dizziness.   She has mild aortic atherosclerosis per CT 02/20/2022.   She is on cholesterol medication (newly on rosuvastatin 10 mg  daily) and denies myalgias. Her cholesterol is not at goal. The cholesterol last visit was:   Lab Results  Component Value Date   CHOL 167 01/06/2023   HDL 88 01/06/2023   LDLCALC 65 01/06/2023   TRIG 62 01/06/2023   CHOLHDL 1.9 01/06/2023   She has been working on diet and exercise for diabetes with CKD, controlled with diet and metformin, she has worked hard to get it down, and denies foot ulcerations, hyperglycemia, hypoglycemia , increased appetite, nausea, paresthesia of the feet, polydipsia,  polyuria, vomiting and weight loss.  She takes metformin 500 mg BID Reports fasting consistently 95-140 Last A1C in the office was:  Lab Results  Component Value Date   HGBA1C 6.8 (H) 01/06/2023   Last GFR- trying to drink at least 64 ounces of water a day Lab Results  Component Value Date   EGFR 78 01/06/2023    Patient is not currently on Vitamin D supplement, was on 10000 IU daily  Lab Results  Component Value Date   VD25OH 59 01/06/2023     Medication Review:  Current Outpatient Medications (Endocrine & Metabolic):    metFORMIN (GLUCOPHAGE) 500 MG tablet, TAKE 1 TABLET 3 X /DAY WITH MEALS FOR DIABETES  Current Outpatient Medications (Cardiovascular):    diltiazem (CARDIZEM CD) 300 MG 24 hr capsule, TAKE 1 CAPSULE BY MOUTH EVERY DAY   EPINEPHrine 0.3 mg/0.3 mL IJ SOAJ injection, Inject 0.3 mg into the muscle as needed for anaphylaxis.   rosuvastatin (CRESTOR) 20 MG tablet, Take  1 tablet  Daily  for Cholesterol   nitroGLYCERIN (NITROSTAT) 0.4 MG SL tablet, Place 1 tablet (0.4 mg total) under the tongue every 5 (five) minutes as needed for chest pain.  Current Outpatient Medications (Respiratory):    ADVAIR HFA 230-21 MCG/ACT inhaler, Use  2 Inhalations  2 x /day  15 minutes apart   2 x /day (every 12 hours)   fexofenadine (ALLEGRA) 180 MG tablet, Take 1 tablet (180 mg total) by mouth daily.   guaiFENesin (MUCINEX PO), Take by mouth.   mometasone (NASONEX) 50 MCG/ACT nasal spray, PLACE 2 SPRAYS INTO THE NOSE DAILY.  Current Outpatient Medications (Analgesics):    traMADol (ULTRAM) 50 MG tablet, Take 1/2 to 1 tablet every 4 hours as needed for pain (Patient not taking: Reported on 04/29/2023)   Current Outpatient Medications (Other):    blood glucose meter kit and supplies, Test sugars twice daily. Dx E11.9   Cholecalciferol (VITAMIN D3) 250 MCG (10000 UT) capsule, Take 10,000 Units by mouth daily.   docusate sodium (COLACE) 100 MG capsule, Take 100 mg by mouth daily.    ketorolac (ACULAR) 0.5 % ophthalmic solution, Place 1 drop into both eyes 4 (four) times daily.   Lancets (ONETOUCH DELICA PLUS LANCET33G) MISC, TEST SUGAR TWICE A DAY   Magnesium 250 MG TABS, Take 250 mg by mouth in the morning and at bedtime.   Misc Natural Products (NEURIVA PO), Take 1 tablet by mouth daily.   omeprazole (PRILOSEC) 40 MG capsule, Take one capsule daily for indigestion and heartburn   ONETOUCH ULTRA test strip, USE TO TEST SUGAR TWICE A DAY   Rhubarb (ESTROVEN MENOPAUSE RELIEF PO), Take 1 capsule by mouth daily.   doxycycline (VIBRAMYCIN) 100 MG capsule, Take 1 capsule 2 x /day with meals for Infection (Patient not taking: Reported on 04/29/2023)   latanoprost (XALATAN) 0.005 % ophthalmic solution, Place 1 drop into both eyes at bedtime. 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 Redness and severe swelling at injection site    Current Problems (verified) has Essential hypertension; GERD (gastroesophageal reflux disease); Vitamin D deficiency; Asthma; Medication management; Osteoporosis; Obesity (BMI 30.0-34.9); Hyperlipidemia associated with type 2 diabetes mellitus ([name]); CKD stage 2 due to type 2 diabetes mellitus ([name]); Type 2 diabetes mellitus with stage 2 chronic kidney disease, without long-term current use of insulin ([name]); Lipoma of right upper extremity; Allergic rhinitis due to pollen; S/P total knee arthroplasty, right; Glaucoma; Prolonged QT interval; S/P knee replacement; SVT (supraventricular tachycardia); 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 02/20/2022; Hepatic steatosis; and Arthralgia of hip on their problem list.  Screening Tests Immunization History  Administered Date(s) Administered   Influenza, High Dose Seasonal PF 11/10/2017,  09/15/2018, 09/04/2019, 04/26/2020, 10/14/2021, 09/02/2022   Influenza, Seasonal, Injecte, Preservative Fre 10/17/2015   Influenza,inj,quad, With Preservative 10/15/2016   Influenza-Unspecified 08/30/2020   PFIZER(Purple Top)SARS-COV-2 Vaccination 12/22/2019, 01/12/2020, 04/26/2020, 04/01/2021   Pneumococcal Conjugate-13 12/22/2018   Pneumococcal Polysaccharide-23 02/04/2017, 04/25/2021, 10/27/2021   Pneumococcal-Unspecified 06/27/2013   Tdap 04/23/2012   Zoster, Live 07/31/2015   Health Maintenance  Topic Date Due   Zoster Vaccines- Shingrix (1 of 2) Never done   DTaP/Tdap/Td (2 - Td or Tdap) 04/23/2022   COVID-19 Vaccine (5 - 2023-24 season) 07/31/2022   FOOT EXAM  04/22/2023   INFLUENZA VACCINE  07/01/2023   HEMOGLOBIN A1C  07/07/2023   Medicare Annual Wellness (AWV)  07/29/2023   Colonoscopy  09/09/2023   OPHTHALMOLOGY EXAM  12/04/2023   Diabetic kidney evaluation - eGFR measurement  01/07/2024   Diabetic kidney evaluation - Urine ACR  01/07/2024   MAMMOGRAM  07/24/2024   DEXA SCAN  09/22/2024   Pneumonia Vaccine 67+ Years old  Completed   Hepatitis C Screening  Completed   HPV VACCINES  Aged Out      Names of Other Physician/Practitioners you currently use: 1. [facility] here for primary care 2. Dr. Dione Booze, eye doctor, last visit 11/2022, sees Dr. Luciana Axe q 4 weeks bilateral non proliferative diabetic retinopathy, open angle glaucoma bilaterally 3. Dr. Sharma Covert, dentist, last visit 2023, goes q70m  Patient Care Team: Lucky Cowboy, MD as PCP - General (Internal Medicine) Little Ishikawa, MD as PCP - Cardiology (Cardiology) Sypher, Molly Maduro, MD (Inactive) as Consulting Physician (Orthopedic Surgery) Mateo Flow, MD as Consulting Physician (Ophthalmology) Sharrell Ku, MD as Consulting Physician (Gastroenterology) Lavina Hamman, MD as Consulting Physician (Obstetrics and Gynecology) Ollen Gross, MD as Consulting  Physician (Orthopedic Surgery) Teryl Lucy, MD as Consulting Physician (Orthopedic Surgery) Sidney Ace, MD as Referring Physician (Allergy) Laurey Morale, MD as Consulting Physician (Cardiology)  Surgical: She  has a past surgical history that includes Bilateral salpingoophorectomy (11/28/2010); Knee arthroscopy (Bilateral, 2005-2016); Colonoscopy; Carpal tunnel release (07/15/2012); Shoulder arthroscopy with rotator cuff repair and subacromial decompression (Right, 01/20/2013); Abdominal hysterectomy; Carpal tunnel release (Right, 05/31/2014); Tubal ligation; Total knee arthroplasty (Right, 02/02/2022); and SVT ABLATION (N/A, 08/17/2022). Family Her family history includes Alzheimer's disease in her father; COPD in her brother; Cirrhosis in her brother; Dementia in her mother; Diabetes in her mother; Hypertension in her brother, brother, father, and mother; Tongue cancer in her sister. Social history  She reports that she has never smoked. She has never used smokeless tobacco. She reports that she  does not drink alcohol and does not use drugs.   Review of Systems  Constitutional:  Negative for malaise/fatigue and weight loss.  HENT:  Negative for hearing loss and tinnitus.   Eyes:  Positive for blurred vision. Negative for double vision.       Retinal detachment, diabetic retinopathy bilateral and glaucoma  Respiratory:  Negative for cough, shortness of breath and wheezing.   Cardiovascular:  Negative for chest pain, palpitations, orthopnea, claudication and leg swelling.       Infrequent heartrate elevations  Gastrointestinal:  Positive for heartburn (controlled with dietary modifications). Negative for abdominal pain, blood in stool, constipation, diarrhea, melena, nausea and vomiting.  Genitourinary: Negative.   Musculoskeletal:  Positive for back pain (left sided sciatica). Negative for joint pain (knee pain improved, intermittent R hip pain, orth following) and myalgias.       Leg  cramps   Skin:  Negative for rash.  Neurological:  Negative for dizziness, tingling, sensory change, weakness and headaches.  Endo/Heme/Allergies:  Negative for polydipsia.  Psychiatric/Behavioral:  Negative for depression. The patient is not nervous/anxious and does not have insomnia.   All other systems reviewed and are negative.    Objective:   Today's Vitals   05/04/23 1408  BP: 138/70  Pulse: 77  Temp: (!) 97.5 F (36.4 C)  SpO2: 98%  Weight: 196 lb 12.8 oz (89.3 kg)  Height: 5\' 2"  (1.575 m)     Body mass index is 36 kg/m.  General appearance: Pleasant obese female in no apparent distress HEENT: normocephalic, sclerae mild erythema, TMs pearly, nares patent, no discharge or erythema, pharynx normal Oral cavity: MMM, no lesions Neck: supple, no lymphadenopathy, no thyromegaly, no masses Heart: RRR, normal S1, S2, no murmurs Lungs: CTA bilaterally, no wheezes, rhonchi, or rales Abdomen: +bs, soft, non tender, non distended, no masses, no hepatomegaly, no splenomegaly Musculoskeletal: nontender, no swelling, no obvious deformity, normal gait Extremities: no edema, no cyanosis, no clubbing.  Pulses: 2+ symmetric, upper and lower extremities, normal cap refill Neurological: alert, oriented x 3, CN2-12 intact, strength normal upper extremities and lower extremities, sensation normal throughout, intact bil feet to monofilament, DTRs 2+ throughout, no cerebellar signs, gait normal Psychiatric: normal affect, behavior normal, pleasant  Skin: warm, dry, intact; no rash or concerning lesions. Well healed R knee midline surgical scar.  Breast/pelvic- defer  EKG: defer to cardiology AAA: < 3 cm  Raynelle Dick, NP   05/04/2023

## 2023-05-04 ENCOUNTER — Ambulatory Visit (INDEPENDENT_AMBULATORY_CARE_PROVIDER_SITE_OTHER): Payer: Medicare HMO | Admitting: Nurse Practitioner

## 2023-05-04 ENCOUNTER — Encounter: Payer: Self-pay | Admitting: Nurse Practitioner

## 2023-05-04 VITALS — BP 138/70 | HR 77 | Temp 97.5°F | Ht 62.0 in | Wt 196.8 lb

## 2023-05-04 DIAGNOSIS — H3322 Serous retinal detachment, left eye: Secondary | ICD-10-CM

## 2023-05-04 DIAGNOSIS — E1122 Type 2 diabetes mellitus with diabetic chronic kidney disease: Secondary | ICD-10-CM

## 2023-05-04 DIAGNOSIS — Z1329 Encounter for screening for other suspected endocrine disorder: Secondary | ICD-10-CM | POA: Diagnosis not present

## 2023-05-04 DIAGNOSIS — E785 Hyperlipidemia, unspecified: Secondary | ICD-10-CM | POA: Diagnosis not present

## 2023-05-04 DIAGNOSIS — Z1389 Encounter for screening for other disorder: Secondary | ICD-10-CM | POA: Diagnosis not present

## 2023-05-04 DIAGNOSIS — E669 Obesity, unspecified: Secondary | ICD-10-CM

## 2023-05-04 DIAGNOSIS — Z0000 Encounter for general adult medical examination without abnormal findings: Secondary | ICD-10-CM

## 2023-05-04 DIAGNOSIS — J4520 Mild intermittent asthma, uncomplicated: Secondary | ICD-10-CM

## 2023-05-04 DIAGNOSIS — Z79899 Other long term (current) drug therapy: Secondary | ICD-10-CM | POA: Diagnosis not present

## 2023-05-04 DIAGNOSIS — I700 Atherosclerosis of aorta: Secondary | ICD-10-CM | POA: Diagnosis not present

## 2023-05-04 DIAGNOSIS — N182 Chronic kidney disease, stage 2 (mild): Secondary | ICD-10-CM | POA: Diagnosis not present

## 2023-05-04 DIAGNOSIS — E559 Vitamin D deficiency, unspecified: Secondary | ICD-10-CM

## 2023-05-04 DIAGNOSIS — D1721 Benign lipomatous neoplasm of skin and subcutaneous tissue of right arm: Secondary | ICD-10-CM

## 2023-05-04 DIAGNOSIS — I10 Essential (primary) hypertension: Secondary | ICD-10-CM

## 2023-05-04 DIAGNOSIS — M810 Age-related osteoporosis without current pathological fracture: Secondary | ICD-10-CM

## 2023-05-04 DIAGNOSIS — Z136 Encounter for screening for cardiovascular disorders: Secondary | ICD-10-CM

## 2023-05-04 DIAGNOSIS — Z0001 Encounter for general adult medical examination with abnormal findings: Secondary | ICD-10-CM

## 2023-05-04 DIAGNOSIS — E113291 Type 2 diabetes mellitus with mild nonproliferative diabetic retinopathy without macular edema, right eye: Secondary | ICD-10-CM

## 2023-05-04 DIAGNOSIS — E1169 Type 2 diabetes mellitus with other specified complication: Secondary | ICD-10-CM

## 2023-05-04 DIAGNOSIS — R232 Flushing: Secondary | ICD-10-CM

## 2023-05-04 DIAGNOSIS — E113412 Type 2 diabetes mellitus with severe nonproliferative diabetic retinopathy with macular edema, left eye: Secondary | ICD-10-CM

## 2023-05-04 DIAGNOSIS — K219 Gastro-esophageal reflux disease without esophagitis: Secondary | ICD-10-CM | POA: Diagnosis not present

## 2023-05-04 DIAGNOSIS — M8580 Other specified disorders of bone density and structure, unspecified site: Secondary | ICD-10-CM

## 2023-05-04 DIAGNOSIS — K760 Fatty (change of) liver, not elsewhere classified: Secondary | ICD-10-CM

## 2023-05-04 DIAGNOSIS — E66811 Obesity, class 1: Secondary | ICD-10-CM

## 2023-05-04 DIAGNOSIS — Z96651 Presence of right artificial knee joint: Secondary | ICD-10-CM

## 2023-05-04 DIAGNOSIS — I471 Supraventricular tachycardia, unspecified: Secondary | ICD-10-CM

## 2023-05-04 DIAGNOSIS — H401134 Primary open-angle glaucoma, bilateral, indeterminate stage: Secondary | ICD-10-CM

## 2023-05-04 LAB — CBC WITH DIFFERENTIAL/PLATELET
Absolute Monocytes: 423 cells/uL (ref 200–950)
Eosinophils Relative: 0.6 %
Lymphs Abs: 3124 cells/uL (ref 850–3900)
MCH: 26.7 pg — ABNORMAL LOW (ref 27.0–33.0)
Monocytes Relative: 5.8 %
Neutrophils Relative %: 50.5 %
RBC: 4.42 10*6/uL (ref 3.80–5.10)

## 2023-05-04 NOTE — Patient Instructions (Signed)
[facility] phone number [phone] Ask to have DEXA(bone density) and mammogram together   Bone Density Test A bone density test uses a type of X-ray to measure the amount of calcium and other minerals in a person's bones. It can measure bone density in the hip and the spine. The test is similar to having a regular X-ray. This test may also be called: Bone densitometry. Bone mineral density test. Dual-energy X-ray absorptiometry (DEXA). You may have this test to: Diagnose a condition that causes weak or thin bones (osteoporosis). Screen you for osteoporosis. Predict your risk for a broken bone (fracture). Determine how well your osteoporosis treatment is working. Tell a health care provider about: Any allergies you have. All medicines you are taking, including vitamins, herbs, eye drops, creams, and over-the-counter medicines. Any problems you or family members have had with anesthetic medicines. Any blood disorders you have. Any surgeries you have had. Any medical conditions you have. Whether you are pregnant or may be pregnant. Any medical tests you have had within the past 14 days that used contrast material. What are the risks? Generally, this is a safe test. However, it does expose you to a small amount of radiation, which can slightly increase your cancer risk. What happens before the test? Do not take any calcium supplements within the 24 hours before your test. You will need to remove all metal jewelry, eyeglasses, removable dental appliances, and any other metal objects on your body. What happens during the test?  You will lie down on an exam table. There will be an X-ray generator below you and an imaging device above you. Other devices, such as boxes or braces, may be used to position your body properly for the scan. The machine will slowly scan your body. You will need to keep very still while the machine does the scan. The images will show up on a screen in the room. Images  will be examined by a specialist after your test is finished. The procedure may vary among health care providers and hospitals. What can I expect after the test? It is up to you to get the results of your test. Ask your health care provider, or the department that is doing the test, when your results will be ready. Summary A bone density test is an imaging test that uses a type of X-ray to measure the amount of calcium and other minerals in your bones. The test may be used to diagnose or screen you for a condition that causes weak or thin bones (osteoporosis), predict your risk for a broken bone (fracture), or determine how well your osteoporosis treatment is working. Do not take any calcium supplements within 24 hours before your test. Ask your health care provider, or the department that is doing the test, when your results will be ready. This information is not intended to replace advice given to you by your health care provider. Make sure you discuss any questions you have with your health care provider. Document Revised: 07/30/2021 Document Reviewed: 05/02/2020 Elsevier Patient Education  2024 [employer].

## 2023-05-05 ENCOUNTER — Encounter: Payer: Self-pay | Admitting: Internal Medicine

## 2023-05-05 LAB — HEMOGLOBIN A1C
Hgb A1c MFr Bld: 6.8 % of total Hgb — ABNORMAL HIGH (ref <5.7)
Mean Plasma Glucose: 148 mg/dL
eAG (mmol/L): 8.2 mmol/L

## 2023-05-05 LAB — COMPLETE METABOLIC PANEL WITH GFR
AG Ratio: 2 (calc) (ref 1.0–2.5)
ALT: 22 U/L (ref 6–29)
AST: 20 U/L (ref 10–35)
Albumin: 4.4 g/dL (ref 3.6–5.1)
Alkaline phosphatase (APISO): 104 U/L (ref 37–153)
BUN: 14 mg/dL (ref 7–25)
CO2: 26 mmol/L (ref 20–32)
Calcium: 9.6 mg/dL (ref 8.6–10.4)
Chloride: 107 mmol/L (ref 98–110)
Creat: 0.91 mg/dL (ref 0.60–1.00)
Globulin: 2.2 g/dL (calc) (ref 1.9–3.7)
Glucose, Bld: 93 mg/dL (ref 65–99)
Potassium: 4.3 mmol/L (ref 3.5–5.3)
Sodium: 142 mmol/L (ref 135–146)
Total Bilirubin: 0.5 mg/dL (ref 0.2–1.2)
Total Protein: 6.6 g/dL (ref 6.1–8.1)
eGFR: 67 mL/min/{1.73_m2} (ref >=60)

## 2023-05-05 LAB — LIPID PANEL
Cholesterol: 172 mg/dL (ref <200)
HDL: 91 mg/dL (ref >=50)
LDL Cholesterol (Calc): 68 mg/dL (calc)
Non-HDL Cholesterol (Calc): 81 mg/dL (calc) (ref <130)
Total CHOL/HDL Ratio: 1.9 (calc) (ref <5.0)
Triglycerides: 44 mg/dL (ref <150)

## 2023-05-05 LAB — CBC WITH DIFFERENTIAL/PLATELET
Basophils Absolute: 22 cells/uL (ref 0–200)
Basophils Relative: 0.3 %
Eosinophils Absolute: 44 cells/uL (ref 15–500)
HCT: 38.2 % (ref 35.0–45.0)
Hemoglobin: 11.8 g/dL (ref 11.7–15.5)
MCHC: 30.9 g/dL — ABNORMAL LOW (ref 32.0–36.0)
MCV: 86.4 fL (ref 80.0–100.0)
MPV: 11.2 fL (ref 7.5–12.5)
Neutro Abs: 3687 cells/uL (ref 1500–7800)
Platelets: 282 10*3/uL (ref 140–400)
RDW: 14.3 % (ref 11.0–15.0)
Total Lymphocyte: 42.8 %
WBC: 7.3 10*3/uL (ref 3.8–10.8)

## 2023-05-05 LAB — VITAMIN D 25 HYDROXY (VIT D DEFICIENCY, FRACTURES): Vit D, 25-Hydroxy: 53 ng/mL (ref 30–100)

## 2023-05-05 LAB — URINALYSIS, ROUTINE W REFLEX MICROSCOPIC
Bacteria, UA: NONE SEEN /HPF
Bilirubin Urine: NEGATIVE
Glucose, UA: NEGATIVE
Hgb urine dipstick: NEGATIVE
Hyaline Cast: NONE SEEN /LPF
Ketones, ur: NEGATIVE
Nitrite: NEGATIVE
Protein, ur: NEGATIVE
RBC / HPF: NONE SEEN /HPF (ref 0–2)
Specific Gravity, Urine: 1.024 (ref 1.001–1.035)
WBC, UA: NONE SEEN /HPF (ref 0–5)
pH: 5.5 (ref 5.0–8.0)

## 2023-05-05 LAB — MICROALBUMIN / CREATININE URINE RATIO
Creatinine, Urine: 163 mg/dL (ref 20–275)
Microalb, Ur: <0.2 mg/dL

## 2023-05-05 LAB — MICROSCOPIC MESSAGE

## 2023-05-05 LAB — MAGNESIUM: Magnesium: 1.9 mg/dL (ref 1.5–2.5)

## 2023-05-05 LAB — TSH: TSH: 0.97 mIU/L (ref 0.40–4.50)

## 2023-05-06 ENCOUNTER — Telehealth: Payer: Self-pay | Admitting: Nurse Practitioner

## 2023-05-06 ENCOUNTER — Telehealth: Payer: Self-pay | Admitting: Cardiology

## 2023-05-06 NOTE — Telephone Encounter (Signed)
Mew Message"    Patient said she was told by Dr Bjorn Pippin to keep her blood readings down for a week and call back with the readings.    04-28-20-   142/74 AM   139/72 PM 04-30-23-   131/70 AM     No reading 05-01-23-      no reading   135/59 PM 05-02-23-      140/71 AM     135/69 PM 05-03-23-       138/69 AM    135/71 PM 05-04-23-       142/74 AM     138/70 doctor office      135/65 PM 05-05-23-        137/73 AM     136/65 PM 05-06-23-         135/63 AM

## 2023-05-06 NOTE — Telephone Encounter (Signed)
Patient has called [facility] on [facility]. And set up an appt. For her Bone density on August 06, 2023 at 11:00 A.M. but was told that in order for them to do it, that our office would need to fax over and order to have it performed. Will you do that for her please?

## 2023-05-06 NOTE — Telephone Encounter (Signed)
I have put in the order can you fax to [facility]

## 2023-05-06 NOTE — Telephone Encounter (Signed)
Cal to patient.  LVM that readings were received, advised Dr Bjorn Pippin out of office and will review on his return.

## 2023-05-07 DIAGNOSIS — J3089 Other allergic rhinitis: Secondary | ICD-10-CM | POA: Diagnosis not present

## 2023-05-07 DIAGNOSIS — J301 Allergic rhinitis due to pollen: Secondary | ICD-10-CM | POA: Diagnosis not present

## 2023-05-07 NOTE — Telephone Encounter (Signed)
BP mildly elevated, would recommend limiting dietary salt intake, would hold off on additional medication at this time

## 2023-05-07 NOTE — Telephone Encounter (Signed)
Called patient left Dr.Schumann's advice on personal voice mail.Advised to continue to monitor B/P and call back if elevated.

## 2023-05-12 DIAGNOSIS — H35352 Cystoid macular degeneration, left eye: Secondary | ICD-10-CM | POA: Diagnosis not present

## 2023-05-12 DIAGNOSIS — J3089 Other allergic rhinitis: Secondary | ICD-10-CM | POA: Diagnosis not present

## 2023-05-12 DIAGNOSIS — J301 Allergic rhinitis due to pollen: Secondary | ICD-10-CM | POA: Diagnosis not present

## 2023-05-12 DIAGNOSIS — E113412 Type 2 diabetes mellitus with severe nonproliferative diabetic retinopathy with macular edema, left eye: Secondary | ICD-10-CM | POA: Diagnosis not present

## 2023-05-12 DIAGNOSIS — H43821 Vitreomacular adhesion, right eye: Secondary | ICD-10-CM | POA: Diagnosis not present

## 2023-05-18 DIAGNOSIS — J301 Allergic rhinitis due to pollen: Secondary | ICD-10-CM | POA: Diagnosis not present

## 2023-05-18 DIAGNOSIS — J3089 Other allergic rhinitis: Secondary | ICD-10-CM | POA: Diagnosis not present

## 2023-05-18 DIAGNOSIS — J3081 Allergic rhinitis due to animal (cat) (dog) hair and dander: Secondary | ICD-10-CM | POA: Diagnosis not present

## 2023-05-20 DIAGNOSIS — E113412 Type 2 diabetes mellitus with severe nonproliferative diabetic retinopathy with macular edema, left eye: Secondary | ICD-10-CM | POA: Diagnosis not present

## 2023-05-20 DIAGNOSIS — H35352 Cystoid macular degeneration, left eye: Secondary | ICD-10-CM | POA: Diagnosis not present

## 2023-05-20 DIAGNOSIS — H43821 Vitreomacular adhesion, right eye: Secondary | ICD-10-CM | POA: Diagnosis not present

## 2023-05-26 DIAGNOSIS — J301 Allergic rhinitis due to pollen: Secondary | ICD-10-CM | POA: Diagnosis not present

## 2023-05-26 DIAGNOSIS — J3081 Allergic rhinitis due to animal (cat) (dog) hair and dander: Secondary | ICD-10-CM | POA: Diagnosis not present

## 2023-05-26 DIAGNOSIS — J3089 Other allergic rhinitis: Secondary | ICD-10-CM | POA: Diagnosis not present

## 2023-06-07 DIAGNOSIS — J3081 Allergic rhinitis due to animal (cat) (dog) hair and dander: Secondary | ICD-10-CM | POA: Diagnosis not present

## 2023-06-07 DIAGNOSIS — J301 Allergic rhinitis due to pollen: Secondary | ICD-10-CM | POA: Diagnosis not present

## 2023-06-07 DIAGNOSIS — J3089 Other allergic rhinitis: Secondary | ICD-10-CM | POA: Diagnosis not present

## 2023-06-16 DIAGNOSIS — J301 Allergic rhinitis due to pollen: Secondary | ICD-10-CM | POA: Diagnosis not present

## 2023-06-16 DIAGNOSIS — J3089 Other allergic rhinitis: Secondary | ICD-10-CM | POA: Diagnosis not present

## 2023-06-24 DIAGNOSIS — J301 Allergic rhinitis due to pollen: Secondary | ICD-10-CM | POA: Diagnosis not present

## 2023-06-24 DIAGNOSIS — J3089 Other allergic rhinitis: Secondary | ICD-10-CM | POA: Diagnosis not present

## 2023-07-01 DIAGNOSIS — E113412 Type 2 diabetes mellitus with severe nonproliferative diabetic retinopathy with macular edema, left eye: Secondary | ICD-10-CM | POA: Diagnosis not present

## 2023-07-01 DIAGNOSIS — H35352 Cystoid macular degeneration, left eye: Secondary | ICD-10-CM | POA: Diagnosis not present

## 2023-07-01 DIAGNOSIS — H33321 Round hole, right eye: Secondary | ICD-10-CM | POA: Diagnosis not present

## 2023-07-01 DIAGNOSIS — E113291 Type 2 diabetes mellitus with mild nonproliferative diabetic retinopathy without macular edema, right eye: Secondary | ICD-10-CM | POA: Diagnosis not present

## 2023-07-01 DIAGNOSIS — J301 Allergic rhinitis due to pollen: Secondary | ICD-10-CM | POA: Diagnosis not present

## 2023-07-01 DIAGNOSIS — H33322 Round hole, left eye: Secondary | ICD-10-CM | POA: Diagnosis not present

## 2023-07-01 DIAGNOSIS — J3081 Allergic rhinitis due to animal (cat) (dog) hair and dander: Secondary | ICD-10-CM | POA: Diagnosis not present

## 2023-07-01 DIAGNOSIS — H401131 Primary open-angle glaucoma, bilateral, mild stage: Secondary | ICD-10-CM | POA: Diagnosis not present

## 2023-07-01 DIAGNOSIS — H43823 Vitreomacular adhesion, bilateral: Secondary | ICD-10-CM | POA: Diagnosis not present

## 2023-07-01 DIAGNOSIS — J3089 Other allergic rhinitis: Secondary | ICD-10-CM | POA: Diagnosis not present

## 2023-07-06 DIAGNOSIS — J3089 Other allergic rhinitis: Secondary | ICD-10-CM | POA: Diagnosis not present

## 2023-07-06 DIAGNOSIS — J301 Allergic rhinitis due to pollen: Secondary | ICD-10-CM | POA: Diagnosis not present

## 2023-07-06 DIAGNOSIS — J3081 Allergic rhinitis due to animal (cat) (dog) hair and dander: Secondary | ICD-10-CM | POA: Diagnosis not present

## 2023-07-12 DIAGNOSIS — J3081 Allergic rhinitis due to animal (cat) (dog) hair and dander: Secondary | ICD-10-CM | POA: Diagnosis not present

## 2023-07-12 DIAGNOSIS — J301 Allergic rhinitis due to pollen: Secondary | ICD-10-CM | POA: Diagnosis not present

## 2023-07-12 DIAGNOSIS — J3089 Other allergic rhinitis: Secondary | ICD-10-CM | POA: Diagnosis not present

## 2023-07-22 DIAGNOSIS — J3089 Other allergic rhinitis: Secondary | ICD-10-CM | POA: Diagnosis not present

## 2023-07-22 DIAGNOSIS — J301 Allergic rhinitis due to pollen: Secondary | ICD-10-CM | POA: Diagnosis not present

## 2023-07-27 DIAGNOSIS — J3089 Other allergic rhinitis: Secondary | ICD-10-CM | POA: Diagnosis not present

## 2023-07-27 DIAGNOSIS — J3081 Allergic rhinitis due to animal (cat) (dog) hair and dander: Secondary | ICD-10-CM | POA: Diagnosis not present

## 2023-07-27 DIAGNOSIS — J301 Allergic rhinitis due to pollen: Secondary | ICD-10-CM | POA: Diagnosis not present

## 2023-07-29 ENCOUNTER — Ambulatory Visit: Payer: Medicare HMO | Admitting: Nurse Practitioner

## 2023-08-03 DIAGNOSIS — D179 Benign lipomatous neoplasm, unspecified: Secondary | ICD-10-CM | POA: Diagnosis not present

## 2023-08-03 DIAGNOSIS — L918 Other hypertrophic disorders of the skin: Secondary | ICD-10-CM | POA: Diagnosis not present

## 2023-08-05 DIAGNOSIS — H35352 Cystoid macular degeneration, left eye: Secondary | ICD-10-CM | POA: Diagnosis not present

## 2023-08-05 DIAGNOSIS — J3081 Allergic rhinitis due to animal (cat) (dog) hair and dander: Secondary | ICD-10-CM | POA: Diagnosis not present

## 2023-08-05 DIAGNOSIS — H43823 Vitreomacular adhesion, bilateral: Secondary | ICD-10-CM | POA: Diagnosis not present

## 2023-08-05 DIAGNOSIS — E113412 Type 2 diabetes mellitus with severe nonproliferative diabetic retinopathy with macular edema, left eye: Secondary | ICD-10-CM | POA: Diagnosis not present

## 2023-08-05 DIAGNOSIS — E113291 Type 2 diabetes mellitus with mild nonproliferative diabetic retinopathy without macular edema, right eye: Secondary | ICD-10-CM | POA: Diagnosis not present

## 2023-08-05 DIAGNOSIS — J301 Allergic rhinitis due to pollen: Secondary | ICD-10-CM | POA: Diagnosis not present

## 2023-08-05 DIAGNOSIS — H401131 Primary open-angle glaucoma, bilateral, mild stage: Secondary | ICD-10-CM | POA: Diagnosis not present

## 2023-08-05 DIAGNOSIS — H33323 Round hole, bilateral: Secondary | ICD-10-CM | POA: Diagnosis not present

## 2023-08-05 DIAGNOSIS — J3089 Other allergic rhinitis: Secondary | ICD-10-CM | POA: Diagnosis not present

## 2023-08-05 NOTE — Progress Notes (Deleted)
AWV and 3 MONTH FOLLOW UP Assessment:   Annual Medicare Wellness Visit Due annually  Health maintenance reviewed DEXA ordered to schedule at [facility]  Essential hypertension - continue medications, DASH diet, exercise and monitor at home. Call if greater than 130/80.   Type 2 diabetes mellitus with CKD, without long-term current use of insulin ([name]) Discussed general issues about diabetes pathophysiology and management., Educational material distributed., Suggested low cholesterol diet., Encouraged aerobic exercise., Discussed foot care., Reminded to get yearly retinal exam. -     Hemoglobin A1c  Mixed hyperlipidemia associated with T2DM ([name]) LDL Goal <70; newly on rosuvastatin  decrease fatty foods increase activity. 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([name]) Continue to follow with retina specialist  SVT Continue to follow with cardiology Continue Diltiazem  Medication management -     Magnesium  Vitamin D deficiency Continue supplement, check levels annually and PRN  Age related osteopenia - on fosamax x 06/2017- 12/2019 - recheck DEXA - order placed to schedule - Continue high calcium diet, vit D supplement - restart fosamax as indicated  Type 2 Diabetes with Obesity([name]) - follow up 3 months for progress monitoring - increase veggies, decrease carbs - long discussion about weight loss, diet, and exercise  Right-sided low back pain without sciatica, unspecified chronicity Ortho follows Continue exercise and weight loss  Gastroesophageal reflux disease, esophagitis presence not specified Continue PPI/H2 blocker, diet discussed  Uncomplicated asthma, unspecified asthma severity, unspecified whether persistent Monitor, doing well with current regimen    Hot flashes Has failed lexapro, paxil, effexor; GYN stopped  estrogen; she is doing well with estroven complete. Weight loss encouraged      Over 40 minutes of exam, counseling, chart review, and critical decision making was performed  Future Appointments  Date Time Provider Department Center  08/09/2023  2:30 PM Raynelle Dick, NP [facility] None  11/02/2023  1:20 PM Little Ishikawa, MD [facility] None  11/10/2023 11:30 AM Lucky Cowboy, MD [facility] None  05/03/2024  2:00 PM Raynelle Dick, NP [facility] None  08/09/2024  2:00 PM Raynelle Dick, NP [facility] None    Plan:   During the course of the visit the patient was educated and counseled about appropriate screening and preventive services including:   Pneumococcal vaccine  Prevnar 13 Influenza vaccine Td vaccine Screening electrocardiogram Bone densitometry screening Colorectal cancer screening Diabetes screening Glaucoma screening Nutrition counseling  Advanced directives: requested   Subjective:  Lauren Lopez is a 72 y.o. AA female who presents for AWV and 3 month follow up.  She is married, 2 grown daughter, 2 grandchildren live in [place], [place]. She is retired Producer, television/film/video associated. Husband has leukemia, they are monitoring it, he is having dementia as well.   She is s/p TAH, did have follow up with GYN. She does have  hot flashes, was on estrogen s/p hysterectomy but GYN tapered off; having sx day and night could not tolerate the lexapro, paxil, effexor, has been on estroven complete with benefit.   She was following with ortho for her back pain, following with Dr. Alvester Morin and has had nerve abation and injections with benefit more recently. . She had negative PTH. She had negative RA workup in 2017. Has seen chiropractic with benefit in the past but not recently due to cost. Also follows with ortho Dr. Lequita Halt for R  knee pain getting injections, does need replacement at some point.   BMI is There is no height or weight on file to calculate BMI., she  has been working on diet and exercise, she has joined gym, going weights and walking, 1 hour 5 days a week. Admits irregular eating, does try to eat more salads. Has cut down on coffee from 5 cups to 2 cups/day.  Wt Readings from Last 3 Encounters:  05/04/23 196 lb 12.8 oz (89.3 kg)  04/29/23 196 lb 3.2 oz (89 kg)  02/04/23 195 lb (88.5 kg)   Her blood pressure has been controlled at home, today their BP is   She does workout.  She denies chest pain, shortness of breath, dizziness.   She is on cholesterol medication (newly on rosuvastatin 10 mg daily) and denies myalgias. Her cholesterol is not at goal. The cholesterol last visit was:   Lab Results  Component Value Date   CHOL 172 05/04/2023   HDL 91 05/04/2023   LDLCALC 68 05/04/2023   TRIG 44 05/04/2023   CHOLHDL 1.9 05/04/2023   She has been working on diet and exercise for diabetes with CKD, controlled with diet and metformin, she has worked hard to get it down, and denies foot ulcerations, hyperglycemia, hypoglycemia , increased appetite, nausea, paresthesia of the feet, polydipsia, polyuria, vomiting and weight loss.  She takes metformin 500 mg BID, not TID Last A1C in the office was:  Lab Results  Component Value Date   HGBA1C 6.8 (H) 05/04/2023   Last GFR Lab Results  Component Value Date   EGFR 67 05/04/2023    Patient is on Vitamin D supplement, 10000 IU daily  Lab Results  Component Value Date   VD25OH 41 05/04/2023   She continues on B12 supplement, taking 2 tabs daily, unsure of dose, hasn't changed dose Lab Results  Component Value Date   VITAMINB12 625 04/21/2022     Medication Review:  Current Outpatient Medications (Endocrine & Metabolic):    metFORMIN (GLUCOPHAGE) 500 MG tablet, TAKE 1 TABLET 3 X /DAY WITH MEALS FOR DIABETES  Current Outpatient Medications (Cardiovascular):    diltiazem (CARDIZEM CD) 300 MG 24 hr capsule, TAKE 1 CAPSULE BY MOUTH EVERY DAY   EPINEPHrine 0.3 mg/0.3 mL IJ SOAJ  injection, Inject 0.3 mg into the muscle as needed for anaphylaxis.   nitroGLYCERIN (NITROSTAT) 0.4 MG SL tablet, Place 1 tablet (0.4 mg total) under the tongue every 5 (five) minutes as needed for chest pain.   rosuvastatin (CRESTOR) 20 MG tablet, Take  1 tablet  Daily  for Cholesterol  Current Outpatient Medications (Respiratory):    ADVAIR HFA 230-21 MCG/ACT inhaler, Use  2 Inhalations  2 x /day  15 minutes apart   2 x /day (every 12 hours)   fexofenadine (ALLEGRA) 180 MG tablet, Take 1 tablet (180 mg total) by mouth daily.   guaiFENesin (MUCINEX PO), Take by mouth.   mometasone (NASONEX) 50 MCG/ACT nasal spray, PLACE 2 SPRAYS INTO THE NOSE DAILY.    Current Outpatient Medications (Other):    blood glucose meter kit and supplies, Test sugars twice daily. Dx E11.9   Cholecalciferol (VITAMIN D3) 250 MCG (10000 UT) capsule, Take 10,000 Units by mouth daily.   docusate sodium (COLACE) 100 MG capsule, Take 100 mg by mouth daily.   ketorolac (ACULAR) 0.5 % ophthalmic solution, Place 1 drop into both eyes 4 (four) times daily.   Lancets (ONETOUCH DELICA PLUS LANCET33G) MISC, TEST SUGAR TWICE A DAY  latanoprost (XALATAN) 0.005 % ophthalmic solution, Place 1 drop into both eyes at bedtime. (Patient not taking: Reported on 05/04/2023)   Magnesium 250 MG TABS, Take 250 mg by mouth in the morning and at bedtime.   Misc Natural Products (NEURIVA PO), Take 1 tablet by mouth daily.   omeprazole (PRILOSEC) 40 MG capsule, Take one capsule daily for indigestion and heartburn   ONETOUCH ULTRA test strip, USE TO TEST SUGAR TWICE A DAY   Rhubarb (ESTROVEN MENOPAUSE RELIEF PO), Take 1 capsule by mouth daily.  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 Redness and severe swelling at injection site    Current Problems (verified) has  Essential hypertension; GERD (gastroesophageal reflux disease); Vitamin D deficiency; Asthma; Medication management; Osteoporosis; Obesity (BMI 30.0-34.9); Hyperlipidemia associated with type 2 diabetes mellitus ([name]); CKD stage 2 due to type 2 diabetes mellitus ([name]); Type 2 diabetes mellitus with stage 2 chronic kidney disease, without long-term current use of insulin ([name]); Lipoma of right upper extremity; Allergic rhinitis due to pollen; S/P total knee arthroplasty, right; Glaucoma; Prolonged QT interval; S/P knee replacement; SVT (supraventricular tachycardia); 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 02/20/2022; Hepatic steatosis; and Arthralgia of hip on their problem list.  Screening Tests Immunization History  Administered Date(s) Administered   Influenza, High Dose Seasonal PF 11/10/2017, 09/15/2018, 09/04/2019, 04/26/2020, 10/14/2021, 09/02/2022   Influenza, Seasonal, Injecte, Preservative Fre 10/17/2015   Influenza,inj,quad, With Preservative 10/15/2016   Influenza-Unspecified 08/30/2020   PFIZER(Purple Top)SARS-COV-2 Vaccination 12/22/2019, 01/12/2020, 04/26/2020, 04/01/2021   Pneumococcal Conjugate-13 12/22/2018   Pneumococcal Polysaccharide-23 02/04/2017, 04/25/2021, 10/27/2021   Pneumococcal-Unspecified 06/27/2013   Tdap 04/23/2012   Zoster, Live 07/31/2015    Preventative care: Last colonoscopy: 2019 had 6 polyps, 5 years Dr. Kinnie Scales  Pap: Hysterectomy, DONE Mammogram:  06/2020 goes to [facility], has scheduled 07/18/2021 DEXA: 07/2019 osteopenia, at [facility] started fosamax 06/2017 but stopped due to jaw pain however doesn't believe this was r/t fosamax, has DEXA order in   Tetanus: 2013 Influenza: 08/2020 Pneumonia: 2/2, done Shingles - zoster, 2016 - check about shingrix  Covid 19: 2/2, 2021, pfizer + 2 boosters  Names of Other Physician/Practitioners you currently use: 1. [facility] here for primary care 2. Dr. Dione Booze, eye  doctor, last visit 2022, report requested 3. Dr. Sharma Covert  , dentist, last visit 2022, goes q72m  Patient Care Team: Lucky Cowboy, MD as PCP - General (Internal Medicine) Little Ishikawa, MD as PCP - Cardiology (Cardiology) Sypher, Molly Maduro, MD (Inactive) as Consulting Physician (Orthopedic Surgery) Mateo Flow, MD as Consulting Physician (Ophthalmology) Sharrell Ku, MD as Consulting Physician (Gastroenterology) Lavina Hamman, MD as Consulting Physician (Obstetrics and Gynecology) Ollen Gross, MD as Consulting Physician (Orthopedic Surgery) Teryl Lucy, MD as Consulting Physician (Orthopedic Surgery) Sidney Ace, MD as Referring Physician (Allergy) Laurey Morale, MD as Consulting Physician (Cardiology)  Surgical: She  has a past surgical history that includes Bilateral salpingoophorectomy (11/28/2010); Knee arthroscopy (Bilateral, 2005-2016); Colonoscopy; Carpal tunnel release (07/15/2012); Shoulder arthroscopy with rotator cuff repair and subacromial decompression (Right, 01/20/2013); Abdominal hysterectomy; Carpal tunnel release (Right, 05/31/2014); Tubal ligation; Total knee arthroplasty (Right, 02/02/2022); and SVT ABLATION (N/A, 08/17/2022). Family Her family history includes Alzheimer's disease in her father; COPD in her brother; Cirrhosis in her brother; Dementia in her mother; Diabetes in her mother; Hypertension  in her brother, brother, father, and mother; Tongue cancer in her sister. Social history  She reports that she has never smoked. She has never used smokeless tobacco. She reports that she does not drink alcohol and does not use drugs.   MEDICARE WELLNESS OBJECTIVES: Physical activity:   Cardiac risk factors:   Depression/mood screen:      01/06/2023   11:32 AM  Depression screen PHQ 2/9  Decreased Interest 0  Down, Depressed, Hopeless 0  PHQ - 2 Score 0    ADLs:     01/06/2023   11:32 AM 08/27/2022   12:04 AM  In your present state of health,  do you have any difficulty performing the following activities:  Hearing? 0 0  Vision? 0 0  Difficulty concentrating or making decisions? 0 0  Walking or climbing stairs? 0 0  Dressing or bathing? 0 0  Doing errands, shopping? 0 0     Cognitive Testing  Alert? Yes  Normal Appearance?Yes  Oriented to person? Yes  Place? Yes   Time? Yes  Recall of three objects?  Yes  Can perform simple calculations? Yes  Displays appropriate judgment?Yes  Can read the correct time from a watch face?Yes  EOL planning:       Objective:   There were no vitals filed for this visit.   There is no height or weight on file to calculate BMI.  General appearance: alert, no distress, WD/WN, female HEENT: normocephalic, sclerae anicteric, TMs pearly, nares patent, no discharge or erythema, pharynx normal Oral cavity: MMM, no lesions Neck: supple, no lymphadenopathy, no thyromegaly, no masses Heart: RRR, normal S1, S2, no murmurs Lungs: CTA bilaterally, no wheezes, rhonchi, or rales Abdomen: +bs, soft, non tender, non distended, no masses, no hepatomegaly, no splenomegaly Musculoskeletal: nontender, no swelling, no obvious deformity, normal gait Extremities: no edema, no cyanosis, no clubbing. She has approx 8 cm rubbery soft tissue mass to post R shoulder, clear borders.  Pulses: 2+ symmetric, upper and lower extremities, normal cap refill Neurological: alert, oriented x 3, CN2-12 intact, strength normal upper extremities and lower extremities, sensation normal throughout, DTRs 2+ throughout, no cerebellar signs, gait normal Psychiatric: normal affect, behavior normal, pleasant  Skin: warm, dry, intact; no rash or concerning lesions, has several skin tags to neck.    Medicare Attestation I have personally reviewed: The patient's medical and social history Their use of alcohol, tobacco or illicit drugs Their current medications and supplements The patient's functional ability including ADLs,fall  risks, home safety risks, cognitive, and hearing and visual impairment Diet and physical activities Evidence for depression or mood disorders  The patient's weight, height, BMI, and visual acuity have been recorded in the chart.  I have made referrals, counseling, and provided education to the patient based on review of the above and I have provided the patient with a written personalized care plan for preventive services.      Raynelle Dick, NP   08/05/2023

## 2023-08-09 ENCOUNTER — Ambulatory Visit: Payer: Medicare HMO | Admitting: Nurse Practitioner

## 2023-08-09 DIAGNOSIS — E113412 Type 2 diabetes mellitus with severe nonproliferative diabetic retinopathy with macular edema, left eye: Secondary | ICD-10-CM

## 2023-08-09 DIAGNOSIS — J4520 Mild intermittent asthma, uncomplicated: Secondary | ICD-10-CM

## 2023-08-09 DIAGNOSIS — I10 Essential (primary) hypertension: Secondary | ICD-10-CM

## 2023-08-09 DIAGNOSIS — K219 Gastro-esophageal reflux disease without esophagitis: Secondary | ICD-10-CM

## 2023-08-09 DIAGNOSIS — E559 Vitamin D deficiency, unspecified: Secondary | ICD-10-CM

## 2023-08-09 DIAGNOSIS — R232 Flushing: Secondary | ICD-10-CM

## 2023-08-09 DIAGNOSIS — M8580 Other specified disorders of bone density and structure, unspecified site: Secondary | ICD-10-CM

## 2023-08-09 DIAGNOSIS — E1122 Type 2 diabetes mellitus with diabetic chronic kidney disease: Secondary | ICD-10-CM

## 2023-08-09 DIAGNOSIS — Z79899 Other long term (current) drug therapy: Secondary | ICD-10-CM

## 2023-08-09 DIAGNOSIS — I700 Atherosclerosis of aorta: Secondary | ICD-10-CM

## 2023-08-09 DIAGNOSIS — M545 Low back pain, unspecified: Secondary | ICD-10-CM

## 2023-08-09 DIAGNOSIS — Z0000 Encounter for general adult medical examination without abnormal findings: Secondary | ICD-10-CM

## 2023-08-09 DIAGNOSIS — E113291 Type 2 diabetes mellitus with mild nonproliferative diabetic retinopathy without macular edema, right eye: Secondary | ICD-10-CM

## 2023-08-09 DIAGNOSIS — E1169 Type 2 diabetes mellitus with other specified complication: Secondary | ICD-10-CM

## 2023-08-11 DIAGNOSIS — J3089 Other allergic rhinitis: Secondary | ICD-10-CM | POA: Diagnosis not present

## 2023-08-11 DIAGNOSIS — J3081 Allergic rhinitis due to animal (cat) (dog) hair and dander: Secondary | ICD-10-CM | POA: Diagnosis not present

## 2023-08-11 DIAGNOSIS — J301 Allergic rhinitis due to pollen: Secondary | ICD-10-CM | POA: Diagnosis not present

## 2023-08-12 ENCOUNTER — Ambulatory Visit: Payer: Medicare HMO | Admitting: Nurse Practitioner

## 2023-08-18 ENCOUNTER — Other Ambulatory Visit: Payer: Self-pay | Admitting: Nurse Practitioner

## 2023-08-18 ENCOUNTER — Other Ambulatory Visit: Payer: Self-pay | Admitting: Internal Medicine

## 2023-08-18 DIAGNOSIS — E1169 Type 2 diabetes mellitus with other specified complication: Secondary | ICD-10-CM

## 2023-08-18 DIAGNOSIS — L02212 Cutaneous abscess of back [any part, except buttock]: Secondary | ICD-10-CM

## 2023-08-19 DIAGNOSIS — Z1231 Encounter for screening mammogram for malignant neoplasm of breast: Secondary | ICD-10-CM | POA: Diagnosis not present

## 2023-08-19 DIAGNOSIS — M8588 Other specified disorders of bone density and structure, other site: Secondary | ICD-10-CM | POA: Diagnosis not present

## 2023-08-19 DIAGNOSIS — J45909 Unspecified asthma, uncomplicated: Secondary | ICD-10-CM | POA: Diagnosis not present

## 2023-08-19 LAB — HM DEXA SCAN

## 2023-08-19 LAB — HM MAMMOGRAPHY

## 2023-08-19 NOTE — Progress Notes (Signed)
AWV and 3 MONTH FOLLOW UP Assessment:   Annual Medicare Wellness Visit Due annually  Health maintenance reviewed DEXA 07/18/21 Due for repeat colonoscopy in 08/2023  Essential hypertension - continue medications, DASH diet, exercise and monitor at home. Call if greater than 130/80.   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 Continue Metformin 500 mg 1 tab BID -     Hemoglobin A1c  Mixed hyperlipidemia associated with T2DM ([name]) LDL Goal <70; newly on rosuvastatin  decrease fatty foods increase activity. 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([name]) Increase fluids, avoid NSAIDS, monitor sugars, will monitor - CBC - CMP  Mild intermittent asthma without complication.  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 Luciana Axe- having injections q 5 weeks  SVT Continue to follow with cardiology Continue Diltiazem  Medication management Medications reviewed and Questions answered   Vitamin D deficiency Continue supplement, check levels annually and PRN  Age related osteopenia - on fosamax x 06/2017- 12/2019 - recheck DEXA ordered 05/2023 - Continue high calcium diet, vit D supplement  Type 2 Diabetes with Morbid Obesity([name]) - follow up 3 months for progress monitoring - increase veggies, decrease carbs - long discussion about weight loss, diet, and exercise  Right-sided low back pain without sciatica, unspecified chronicity Ortho follows Continue exercise and weight loss  Gastroesophageal reflux disease, esophagitis presence not specified Continue PPI/H2 blocker, diet  discussed  Uncomplicated asthma, unspecified asthma severity, unspecified whether persistent Monitor, doing well with current regimen   Flu vaccine Need - High dose flu vaccine given  Lipoma of right upper extremity Was evaluated by dermatology but due to size 13X13 cm will need plastic surgery to remove as may need a flap due to size Wants to wait until after January as husband is currently dealing with leukemia Monitor and if increases in size or pain worsens notify the office      Over 40 minutes of exam, counseling, chart review, and critical decision making was performed  Future Appointments  Date Time Provider Department Center  11/02/2023  1:20 PM Little Ishikawa, MD [facility] None  11/10/2023 11:30 AM Lucky Cowboy, MD [facility] None  05/03/2024  2:00 PM Raynelle Dick, NP [facility] None  08/23/2024 11:30 AM Raynelle Dick, NP [facility] None    Plan:   During the course of the visit the patient was educated and counseled about appropriate screening and preventive services including:   Pneumococcal vaccine  Prevnar 13 Influenza vaccine Td vaccine Screening electrocardiogram Bone densitometry screening Colorectal cancer screening Diabetes screening Glaucoma screening Nutrition counseling  Advanced directives: requested   Subjective:  Lauren Lopez is a 72 y.o. AA female who presents for AWV and 3 month follow up.  She is married, 2 grown daughter, 2 grandchildren live in [place], [place]. She is retired Producer, television/film/video associated. Husband has leukemia, they are monitoring it, he is having dementia as well.   She is s/p TAH, did have follow up with GYN. She does have  hot flashes, was on estrogen s/p hysterectomy but GYN tapered off; having sx day and night could not tolerate the lexapro, paxil, effexor, has been on estroven complete with benefit.  She has 13 x 13 cm lipoma of right shoulder. She states it will only occasionally hurt .  She does  not want to have done until after the new year as she is dealing with worsening leukemia and is undergoing more tests so she does not want to be down with surgery. Was only discovered went he went to a new doctor from his previous doctor that retired.   She was following with ortho for her back pain, following with Dr. Alvester Morin and has had nerve abation and injections with benefit more recently. . She had negative PTH. She had negative RA workup in 2017. Has seen chiropractic with benefit in the past but not recently due to cost.  She is not currently having injections.  Had right TKA 02/02/22 , states that her knee is doing well.   BMI is Body mass index is 35.22 kg/m., she has been working on diet and exercise, she has not been doing as much exercise at gym but does a lot of yard work  [employer] from Last 3 Encounters:  08/23/23 198 lb 12.8 oz (90.2 kg)  05/04/23 196 lb 12.8 oz (89.3 kg)  04/29/23 196 lb 3.2 oz (89 kg)   She had previous SVT ablation 08/17/22.  Denies palpitations. Takes diltiazem 300 mg daily. Continue to follow with Dr. Bjorn Pippin.  Her blood pressure has been controlled at home on diltiazem 300 mg at bedtime , today their BP is BP: 120/70  BP Readings from Last 3 Encounters:  08/23/23 120/70  05/04/23 138/70  04/29/23 (!) 142/74  She does workout.  She denies chest pain, shortness of breath, dizziness.    She is on cholesterol medication, rosuvastatin 20 mg daily and denies myalgias. Her cholesterol is not at goal. The cholesterol last visit was:   Lab Results  Component Value Date   CHOL 172 05/04/2023   HDL 91 05/04/2023   LDLCALC 68 05/04/2023   TRIG 44 05/04/2023   CHOLHDL 1.9 05/04/2023   She has been working on diet and exercise for diabetes with CKD, controlled with diet and metformin, she has worked hard to get it down, and denies foot ulcerations, hyperglycemia, hypoglycemia , increased appetite, nausea, paresthesia of the feet, polydipsia, polyuria, vomiting  and weight loss.  She takes metformin 500 mg BID. She has been checking her blod sugars fasting and usually in 120's Last A1C in the office was:  Lab Results  Component Value Date   HGBA1C 6.8 (H) 05/04/2023   She is trying to drink lots of water. Last GFR Lab Results  Component Value Date   EGFR 67 05/04/2023    Patient is on Vitamin D supplement, 10000 IU daily  Lab Results  Component Value Date   VD25OH 27 05/04/2023   She continues on B12 supplement, taking 2 tabs daily, unsure of dose, hasn't changed dose Lab Results  Component Value Date   VITAMINB12 625 04/21/2022     Medication Review:  Current Outpatient Medications (Endocrine & Metabolic):    metFORMIN (GLUCOPHAGE) 500 MG tablet, TAKE 1 TABLET 3 X /DAY WITH MEALS FOR DIABETES  Current Outpatient Medications (Cardiovascular):    diltiazem (CARDIZEM CD) 300 MG 24 hr capsule, TAKE 1 CAPSULE BY MOUTH EVERY DAY   EPINEPHrine 0.3 mg/0.3 mL IJ SOAJ injection, Inject 0.3 mg into the muscle as needed for anaphylaxis.   nitroGLYCERIN (NITROSTAT) 0.4 MG SL tablet, Place 1 tablet (0.4 mg total) under the tongue every 5 (five) minutes as needed for chest  pain.   rosuvastatin (CRESTOR) 20 MG tablet, TAKE 1 TABLET BY MOUTH EVERY DAY FOR CHOLESTEROL  Current Outpatient Medications (Respiratory):    ADVAIR HFA 230-21 MCG/ACT inhaler, Use  2 Inhalations  2 x /day  15 minutes apart   2 x /day (every 12 hours)   fexofenadine (ALLEGRA) 180 MG tablet, Take 1 tablet (180 mg total) by mouth daily.   guaiFENesin (MUCINEX PO), Take by mouth.   mometasone (NASONEX) 50 MCG/ACT nasal spray, PLACE 2 SPRAYS INTO THE NOSE DAILY.    Current Outpatient Medications (Other):    blood glucose meter kit and supplies, Test sugars twice daily. Dx E11.9   Cholecalciferol (VITAMIN D3) 250 MCG (10000 UT) capsule, Take 10,000 Units by mouth daily.   docusate sodium (COLACE) 100 MG capsule, Take 100 mg by mouth daily.   ketorolac (ACULAR) 0.5 %  ophthalmic solution, Place 1 drop into both eyes 4 (four) times daily.   Lancets (ONETOUCH DELICA PLUS LANCET33G) MISC, TEST SUGAR TWICE A DAY   Magnesium 250 MG TABS, Take 250 mg by mouth in the morning and at bedtime.   Misc Natural Products (NEURIVA PO), Take 1 tablet by mouth daily.   omeprazole (PRILOSEC) 40 MG capsule, TAKE ONE CAPSULE DAILY FOR INDIGESTION AND HEARTBURN   ONETOUCH ULTRA test strip, USE TO TEST SUGAR TWICE A DAY   Rhubarb (ESTROVEN MENOPAUSE RELIEF PO), Take 1 capsule by mouth daily.  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 Redness and severe swelling at injection site    Current Problems (verified) has Essential hypertension; GERD (gastroesophageal reflux disease); Vitamin D deficiency; Asthma; Medication management; Osteoporosis; Obesity (BMI 30.0-34.9); Hyperlipidemia associated with type 2 diabetes mellitus ([name]); CKD stage 2 due to type 2 diabetes mellitus ([name]); Type 2 diabetes mellitus with stage 2 chronic kidney disease, without long-term current use of insulin ([name]); Lipoma of right upper extremity; Allergic rhinitis due to pollen; S/P total knee arthroplasty, right; Glaucoma; Prolonged QT interval; S/P knee replacement; SVT (supraventricular tachycardia); 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 02/20/2022; Hepatic steatosis; and Arthralgia of hip on their problem list.  Screening Tests Immunization History  Administered Date(s) Administered   Influenza, High Dose Seasonal PF 11/10/2017, 09/15/2018, 09/04/2019, 04/26/2020, 10/14/2021, 09/02/2022   Influenza, Seasonal, Injecte, Preservative Fre 10/17/2015   Influenza,inj,quad, With Preservative 10/15/2016   Influenza-Unspecified 08/30/2020   PFIZER(Purple Top)SARS-COV-2 Vaccination 12/22/2019, 01/12/2020, 04/26/2020, 04/01/2021    Pfizer Covid-19 Vaccine Bivalent Booster 28yrs & up 08/20/2023   Pneumococcal Conjugate-13 12/22/2018   Pneumococcal Polysaccharide-23 02/04/2017, 04/25/2021, 10/27/2021   Pneumococcal-Unspecified 06/27/2013   Tdap 04/23/2012   Zoster, Live 07/31/2015    Health Maintenance  Topic Date Due   FOOT EXAM  04/22/2023   INFLUENZA VACCINE  07/01/2023   Colonoscopy  09/09/2023   Zoster Vaccines- Shingrix (1 of 2) 11/22/2023 (Originally 10/19/1970)   COVID-19 Vaccine (6 - 2023-24 season) 10/15/2023   HEMOGLOBIN A1C  11/03/2023   Diabetic kidney evaluation - eGFR measurement  05/03/2024   Diabetic kidney evaluation - Urine ACR  05/03/2024   OPHTHALMOLOGY EXAM  05/11/2024   Medicare Annual Wellness (AWV)  08/22/2024   DEXA SCAN  09/22/2024   MAMMOGRAM  08/18/2025   Pneumonia Vaccine 76+ Years old  Completed   Hepatitis C Screening  Completed   HPV VACCINES  Aged Out  DTaP/Tdap/Td  Discontinued     Names of Other Physician/Practitioners you currently use: 1. [facility] here for primary care 2. Dr. Luciana Axe every 5 weeks injections left eye  3. Dr. Sharma Covert  , dentist, last visit 08/2023  Patient Care Team: Lucky Cowboy, MD as PCP - General (Internal Medicine) Little Ishikawa, MD as PCP - Cardiology (Cardiology) Sypher, Molly Maduro, MD (Inactive) as Consulting Physician (Orthopedic Surgery) Mateo Flow, MD as Consulting Physician (Ophthalmology) Sharrell Ku, MD as Consulting Physician (Gastroenterology) Lavina Hamman, MD as Consulting Physician (Obstetrics and Gynecology) Ollen Gross, MD as Consulting Physician (Orthopedic Surgery) Teryl Lucy, MD as Consulting Physician (Orthopedic Surgery) Sidney Ace, MD as Referring Physician (Allergy) Laurey Morale, MD as Consulting Physician (Cardiology)  Surgical: She  has a past surgical history that includes Bilateral salpingoophorectomy (11/28/2010); Knee arthroscopy  (Bilateral, 2005-2016); Colonoscopy; Carpal tunnel release (07/15/2012); Shoulder arthroscopy with rotator cuff repair and subacromial decompression (Right, 01/20/2013); Abdominal hysterectomy; Carpal tunnel release (Right, 05/31/2014); Tubal ligation; Total knee arthroplasty (Right, 02/02/2022); and SVT ABLATION (N/A, 08/17/2022). Family Her family history includes Alzheimer's disease in her father; COPD in her brother; Cirrhosis in her brother; Dementia in her mother; Diabetes in her mother; Hypertension in her brother, brother, father, and mother; Tongue cancer in her sister. Social history  She reports that she has never smoked. She has never used smokeless tobacco. She reports that she does not drink alcohol and does not use drugs.   MEDICARE WELLNESS OBJECTIVES: Physical activity: Yard work   Cardiac risk factors: Cardiac Risk Factors include: advanced age (>23men, >29 women);diabetes mellitus;hypertension;dyslipidemia;obesity (BMI >30kg/m2) Depression/mood screen:      08/23/2023   12:10 PM  Depression screen PHQ 2/9  Decreased Interest 0  Down, Depressed, Hopeless 0  PHQ - 2 Score 0    ADLs:     08/23/2023   12:08 PM 01/06/2023   11:32 AM  In your present state of health, do you have any difficulty performing the following activities:  Hearing? 0 0  Vision? 0 0  Difficulty concentrating or making decisions? 0 0  Walking or climbing stairs? 0 0  Dressing or bathing? 0 0  Doing errands, shopping? 0 0     Cognitive Testing  Alert? Yes  Normal Appearance?Yes  Oriented to person? Yes  Place? Yes   Time? Yes  Recall of three objects?  Yes  Can perform simple calculations? Yes  Displays appropriate judgment?Yes  Can read the correct time from a watch face?Yes  EOL planning: Does Patient Have a Medical Advance Directive?: No Would patient like information on creating a medical advance directive?: No - Patient declined     Objective:   Today's Vitals   08/23/23 1141  BP:  120/70  Pulse: 74  Temp: (!) 97.4 F (36.3 C)  SpO2: 99%  Weight: 198 lb 12.8 oz (90.2 kg)  Height: 5\' 3"  (1.6 m)     Body mass index is 35.22 kg/m.  General appearance: alert, no distress, WD/WN, female HEENT: normocephalic, sclerae anicteric, TMs pearly, nares patent, no discharge or erythema, pharynx normal Oral cavity: MMM, no lesions Neck: supple, no lymphadenopathy, no thyromegaly, no masses Heart: RRR, normal S1, S2, no murmurs Lungs: CTA bilaterally, no wheezes, rhonchi, or rales Abdomen: +bs, soft, non tender, non distended, no masses, no hepatomegaly, no splenomegaly Musculoskeletal: nontender, no swelling, no obvious deformity, normal gait Extremities: no edema, no cyanosis, no clubbing.   Pulses: 2+ symmetric, upper and lower extremities, normal cap refill Neurological:  alert, oriented x 3, CN2-12 intact, strength normal upper extremities and lower extremities, sensation normal of lower extremities to monofilament. DTRs 2+ throughout, no cerebellar signs, gait normal Psychiatric: normal affect, behavior normal, pleasant  Skin: warm, dry, intact;13 x 13 cm lipoma of right shoulder, nontender on palpation   Medicare Attestation I have personally reviewed: The patient's medical and social history Their use of alcohol, tobacco or illicit drugs Their current medications and supplements The patient's functional ability including ADLs,fall risks, home safety risks, cognitive, and hearing and visual impairment Diet and physical activities Evidence for depression or mood disorders  The patient's weight, height, BMI, and visual acuity have been recorded in the chart.  I have made referrals, counseling, and provided education to the patient based on review of the above and I have provided the patient with a written personalized care plan for preventive services.      Raynelle Dick, NP   08/23/2023

## 2023-08-20 ENCOUNTER — Encounter: Payer: Self-pay | Admitting: Internal Medicine

## 2023-08-22 ENCOUNTER — Other Ambulatory Visit: Payer: Self-pay | Admitting: Internal Medicine

## 2023-08-23 ENCOUNTER — Ambulatory Visit (INDEPENDENT_AMBULATORY_CARE_PROVIDER_SITE_OTHER): Payer: Medicare HMO | Admitting: Nurse Practitioner

## 2023-08-23 ENCOUNTER — Encounter: Payer: Self-pay | Admitting: Nurse Practitioner

## 2023-08-23 VITALS — BP 120/70 | HR 74 | Temp 97.4°F | Ht 63.0 in | Wt 198.8 lb

## 2023-08-23 DIAGNOSIS — R6889 Other general symptoms and signs: Secondary | ICD-10-CM | POA: Diagnosis not present

## 2023-08-23 DIAGNOSIS — E785 Hyperlipidemia, unspecified: Secondary | ICD-10-CM

## 2023-08-23 DIAGNOSIS — Z23 Encounter for immunization: Secondary | ICD-10-CM

## 2023-08-23 DIAGNOSIS — E1169 Type 2 diabetes mellitus with other specified complication: Secondary | ICD-10-CM

## 2023-08-23 DIAGNOSIS — E1122 Type 2 diabetes mellitus with diabetic chronic kidney disease: Secondary | ICD-10-CM | POA: Diagnosis not present

## 2023-08-23 DIAGNOSIS — E113412 Type 2 diabetes mellitus with severe nonproliferative diabetic retinopathy with macular edema, left eye: Secondary | ICD-10-CM

## 2023-08-23 DIAGNOSIS — E113291 Type 2 diabetes mellitus with mild nonproliferative diabetic retinopathy without macular edema, right eye: Secondary | ICD-10-CM | POA: Diagnosis not present

## 2023-08-23 DIAGNOSIS — J4520 Mild intermittent asthma, uncomplicated: Secondary | ICD-10-CM | POA: Diagnosis not present

## 2023-08-23 DIAGNOSIS — I10 Essential (primary) hypertension: Secondary | ICD-10-CM | POA: Diagnosis not present

## 2023-08-23 DIAGNOSIS — I471 Supraventricular tachycardia, unspecified: Secondary | ICD-10-CM | POA: Diagnosis not present

## 2023-08-23 DIAGNOSIS — Z0001 Encounter for general adult medical examination with abnormal findings: Secondary | ICD-10-CM

## 2023-08-23 DIAGNOSIS — K219 Gastro-esophageal reflux disease without esophagitis: Secondary | ICD-10-CM | POA: Diagnosis not present

## 2023-08-23 DIAGNOSIS — E559 Vitamin D deficiency, unspecified: Secondary | ICD-10-CM | POA: Diagnosis not present

## 2023-08-23 DIAGNOSIS — K760 Fatty (change of) liver, not elsewhere classified: Secondary | ICD-10-CM

## 2023-08-23 DIAGNOSIS — N182 Chronic kidney disease, stage 2 (mild): Secondary | ICD-10-CM

## 2023-08-23 DIAGNOSIS — D1721 Benign lipomatous neoplasm of skin and subcutaneous tissue of right arm: Secondary | ICD-10-CM

## 2023-08-23 DIAGNOSIS — Z79899 Other long term (current) drug therapy: Secondary | ICD-10-CM

## 2023-08-23 DIAGNOSIS — R232 Flushing: Secondary | ICD-10-CM

## 2023-08-23 DIAGNOSIS — I700 Atherosclerosis of aorta: Secondary | ICD-10-CM | POA: Diagnosis not present

## 2023-08-23 DIAGNOSIS — M8580 Other specified disorders of bone density and structure, unspecified site: Secondary | ICD-10-CM | POA: Diagnosis not present

## 2023-08-23 DIAGNOSIS — Z0000 Encounter for general adult medical examination without abnormal findings: Secondary | ICD-10-CM

## 2023-08-23 NOTE — Patient Instructions (Signed)

## 2023-08-24 LAB — COMPLETE METABOLIC PANEL WITH GFR
AG Ratio: 1.7 (calc) (ref 1.0–2.5)
ALT: 16 U/L (ref 6–29)
AST: 19 U/L (ref 10–35)
Albumin: 4.3 g/dL (ref 3.6–5.1)
Alkaline phosphatase (APISO): 118 U/L (ref 37–153)
BUN: 14 mg/dL (ref 7–25)
CO2: 24 mmol/L (ref 20–32)
Calcium: 9.4 mg/dL (ref 8.6–10.4)
Chloride: 103 mmol/L (ref 98–110)
Creat: 0.73 mg/dL (ref 0.60–1.00)
Globulin: 2.5 g/dL (calc) (ref 1.9–3.7)
Glucose, Bld: 100 mg/dL — ABNORMAL HIGH (ref 65–99)
Potassium: 4.3 mmol/L (ref 3.5–5.3)
Sodium: 139 mmol/L (ref 135–146)
Total Bilirubin: 0.5 mg/dL (ref 0.2–1.2)
Total Protein: 6.8 g/dL (ref 6.1–8.1)
eGFR: 88 mL/min/{1.73_m2} (ref >=60)

## 2023-08-24 LAB — CBC WITH DIFFERENTIAL/PLATELET
Absolute Monocytes: 360 cells/uL (ref 200–950)
Basophils Absolute: 20 cells/uL (ref 0–200)
Basophils Relative: 0.4 %
Eosinophils Absolute: 130 cells/uL (ref 15–500)
Eosinophils Relative: 2.6 %
HCT: 40.2 % (ref 35.0–45.0)
Hemoglobin: 12.3 g/dL (ref 11.7–15.5)
Lymphs Abs: 2485 cells/uL (ref 850–3900)
MCH: 27 pg (ref 27.0–33.0)
MCHC: 30.6 g/dL — ABNORMAL LOW (ref 32.0–36.0)
MCV: 88.2 fL (ref 80.0–100.0)
MPV: 11.6 fL (ref 7.5–12.5)
Monocytes Relative: 7.2 %
Neutro Abs: 2005 cells/uL (ref 1500–7800)
Neutrophils Relative %: 40.1 %
Platelets: 286 10*3/uL (ref 140–400)
RBC: 4.56 10*6/uL (ref 3.80–5.10)
RDW: 13.6 % (ref 11.0–15.0)
Total Lymphocyte: 49.7 %
WBC: 5 10*3/uL (ref 3.8–10.8)

## 2023-08-24 LAB — LIPID PANEL
Cholesterol: 167 mg/dL (ref <200)
HDL: 91 mg/dL (ref >=50)
LDL Cholesterol (Calc): 63 mg/dL (calc)
Non-HDL Cholesterol (Calc): 76 mg/dL (calc) (ref <130)
Total CHOL/HDL Ratio: 1.8 (calc) (ref <5.0)
Triglycerides: 44 mg/dL (ref <150)

## 2023-08-24 LAB — HEMOGLOBIN A1C W/OUT EAG: Hgb A1c MFr Bld: 6.8 % of total Hgb — ABNORMAL HIGH (ref <5.7)

## 2023-08-31 DIAGNOSIS — J301 Allergic rhinitis due to pollen: Secondary | ICD-10-CM | POA: Diagnosis not present

## 2023-08-31 DIAGNOSIS — J3089 Other allergic rhinitis: Secondary | ICD-10-CM | POA: Diagnosis not present

## 2023-08-31 DIAGNOSIS — J3081 Allergic rhinitis due to animal (cat) (dog) hair and dander: Secondary | ICD-10-CM | POA: Diagnosis not present

## 2023-09-08 DIAGNOSIS — J3089 Other allergic rhinitis: Secondary | ICD-10-CM | POA: Diagnosis not present

## 2023-09-08 DIAGNOSIS — J301 Allergic rhinitis due to pollen: Secondary | ICD-10-CM | POA: Diagnosis not present

## 2023-09-08 DIAGNOSIS — J3081 Allergic rhinitis due to animal (cat) (dog) hair and dander: Secondary | ICD-10-CM | POA: Diagnosis not present

## 2023-09-09 DIAGNOSIS — H33323 Round hole, bilateral: Secondary | ICD-10-CM | POA: Diagnosis not present

## 2023-09-09 DIAGNOSIS — H43821 Vitreomacular adhesion, right eye: Secondary | ICD-10-CM | POA: Diagnosis not present

## 2023-09-09 DIAGNOSIS — H35352 Cystoid macular degeneration, left eye: Secondary | ICD-10-CM | POA: Diagnosis not present

## 2023-09-09 DIAGNOSIS — H401131 Primary open-angle glaucoma, bilateral, mild stage: Secondary | ICD-10-CM | POA: Diagnosis not present

## 2023-09-09 DIAGNOSIS — H43822 Vitreomacular adhesion, left eye: Secondary | ICD-10-CM | POA: Diagnosis not present

## 2023-09-09 DIAGNOSIS — E113291 Type 2 diabetes mellitus with mild nonproliferative diabetic retinopathy without macular edema, right eye: Secondary | ICD-10-CM | POA: Diagnosis not present

## 2023-09-09 DIAGNOSIS — E113412 Type 2 diabetes mellitus with severe nonproliferative diabetic retinopathy with macular edema, left eye: Secondary | ICD-10-CM | POA: Diagnosis not present

## 2023-09-16 DIAGNOSIS — J301 Allergic rhinitis due to pollen: Secondary | ICD-10-CM | POA: Diagnosis not present

## 2023-09-16 DIAGNOSIS — J3089 Other allergic rhinitis: Secondary | ICD-10-CM | POA: Diagnosis not present

## 2023-09-23 DIAGNOSIS — J301 Allergic rhinitis due to pollen: Secondary | ICD-10-CM | POA: Diagnosis not present

## 2023-09-23 DIAGNOSIS — J3089 Other allergic rhinitis: Secondary | ICD-10-CM | POA: Diagnosis not present

## 2023-09-23 DIAGNOSIS — J3081 Allergic rhinitis due to animal (cat) (dog) hair and dander: Secondary | ICD-10-CM | POA: Diagnosis not present

## 2023-09-30 DIAGNOSIS — J301 Allergic rhinitis due to pollen: Secondary | ICD-10-CM | POA: Diagnosis not present

## 2023-09-30 DIAGNOSIS — J3081 Allergic rhinitis due to animal (cat) (dog) hair and dander: Secondary | ICD-10-CM | POA: Diagnosis not present

## 2023-09-30 DIAGNOSIS — J3089 Other allergic rhinitis: Secondary | ICD-10-CM | POA: Diagnosis not present

## 2023-10-07 DIAGNOSIS — J3089 Other allergic rhinitis: Secondary | ICD-10-CM | POA: Diagnosis not present

## 2023-10-07 DIAGNOSIS — J301 Allergic rhinitis due to pollen: Secondary | ICD-10-CM | POA: Diagnosis not present

## 2023-10-14 DIAGNOSIS — J301 Allergic rhinitis due to pollen: Secondary | ICD-10-CM | POA: Diagnosis not present

## 2023-10-14 DIAGNOSIS — J3089 Other allergic rhinitis: Secondary | ICD-10-CM | POA: Diagnosis not present

## 2023-10-21 DIAGNOSIS — H33323 Round hole, bilateral: Secondary | ICD-10-CM | POA: Diagnosis not present

## 2023-10-21 DIAGNOSIS — E113291 Type 2 diabetes mellitus with mild nonproliferative diabetic retinopathy without macular edema, right eye: Secondary | ICD-10-CM | POA: Diagnosis not present

## 2023-10-21 DIAGNOSIS — H43821 Vitreomacular adhesion, right eye: Secondary | ICD-10-CM | POA: Diagnosis not present

## 2023-10-21 DIAGNOSIS — H35352 Cystoid macular degeneration, left eye: Secondary | ICD-10-CM | POA: Diagnosis not present

## 2023-10-21 DIAGNOSIS — J3089 Other allergic rhinitis: Secondary | ICD-10-CM | POA: Diagnosis not present

## 2023-10-21 DIAGNOSIS — E113412 Type 2 diabetes mellitus with severe nonproliferative diabetic retinopathy with macular edema, left eye: Secondary | ICD-10-CM | POA: Diagnosis not present

## 2023-10-21 DIAGNOSIS — H401131 Primary open-angle glaucoma, bilateral, mild stage: Secondary | ICD-10-CM | POA: Diagnosis not present

## 2023-10-21 DIAGNOSIS — H43822 Vitreomacular adhesion, left eye: Secondary | ICD-10-CM | POA: Diagnosis not present

## 2023-10-21 DIAGNOSIS — J301 Allergic rhinitis due to pollen: Secondary | ICD-10-CM | POA: Diagnosis not present

## 2023-10-31 NOTE — Progress Notes (Unsigned)
Cardiology Office Note:    Date:  11/02/2023   ID:  Lauren Lopez, DOB 1951-10-31, MRN [id number]  PCP:  Lucky Cowboy, MD  Cardiologist:  Little Ishikawa, MD  Electrophysiologist:  None   Referring MD: Lucky Cowboy, MD   Chief Complaint  Patient presents with   Chest Pain    History of Present Illness:    Lauren Lopez is a 72 y.o. female with a hx of hypertension, hyperlipidemia, T2DM, asthma, SVT who presents for follow-up.  She was admitted with asthma exacerbation in March 2023.  Cardiology was consulted as she was having frequent episodes of SVT.  Echocardiogram 02/23/2022 showed normal biventricular function, no significant valvular disease.  Zio patch x9 days showed reported atrial flutter (23% burden) but on review I think these episodes are more likely atrial tachycardia with average rate 125 bpm.  She was seen by Dr. Ladona Ridgel and felt to be likely SVT, underwent ablation 07/2022.  Zio patch x 14 days 08/2022 showed very brief runs (less than 4 seconds) of SVT.  Stress echo 12/08/2022 showed no evidence of ischemia but poor exercise capacity (4.6 METS) and frequent PVCs during exercise.  Since last clinic visit, she reports she is doing okay.  States that had episode of of chest pain and felt like heart was racing about 3 weeks ago.  States that she was walking up a hill to her church when started to have tightness in chest.  Felt short of breath as well and felt like heart was racing.  No episodes since.   Past Medical History:  Diagnosis Date   Acute asthma exacerbation 02/20/2022   Arthritis    "right hand, back" (04/03/2016)   Asthma    takes weekly allergy shots, none in 5-6 weeks due to knee surgery   Bilateral carpal tunnel syndrome    Chronic lower back pain    GERD (gastroesophageal reflux disease)    Hyperlipidemia    Hypertension    Multiple allergies    Pneumonia 03/2012   Positive PPD    "they told me it wasn't positive; I was allergic to the  test itself"   Right rotator cuff tear 01/20/2013   Type II diabetes mellitus ([name])    Vasculitis ([name]) 04/03/2016   Vitamin deficiency    Wears dentures    upper   Wears glasses     Past Surgical History:  Procedure Laterality Date   ABDOMINAL HYSTERECTOMY     BILATERAL SALPINGOOPHORECTOMY  11/28/2010   open laparoscopy with adhesiolysis also   CARPAL TUNNEL RELEASE  07/15/2012   Procedure: CARPAL TUNNEL RELEASE;  Surgeon: Wyn Forster., MD;  Location: [facility];  Service: Orthopedics;  Laterality: Left;   CARPAL TUNNEL RELEASE Right 05/31/2014   Procedure: RIGHT CARPAL TUNNEL RELEASE;  Surgeon: Wyn Forster, MD;  Location: [facility];  Service: Orthopedics;  Laterality: Right;   COLONOSCOPY     KNEE ARTHROSCOPY Bilateral 2005-2016   "right-left"   SHOULDER ARTHROSCOPY WITH ROTATOR CUFF REPAIR AND SUBACROMIAL DECOMPRESSION Right 01/20/2013   Procedure: RIGHT ARTHROSCOPY SHOULDER DEBRIDEMENT LIMITED, ARTHROSCOPY SHOULDER DECOMPRESSION SUBACROMIAL PARTIAL ACROMIOPLASTY WITH CORACOACROMIAL RELEASE, ROTATOR CUFF REPAIR ;  Surgeon: Eulas Post, MD;  Location: [facility];  Service: Orthopedics;  Laterality: Right;  RIGHT SHOULDER SCOPE DEBRIDEMENT, ACRIOMIOPLASTY, ROTATOR CUFF REPAIR   SVT ABLATION N/A 08/17/2022   Procedure: SVT ABLATION;  Surgeon: Marinus Maw, MD;  Location: [facility];  Service:  Cardiovascular;  Laterality: N/A;   TOTAL KNEE ARTHROPLASTY Right 02/02/2022   Procedure: TOTAL KNEE ARTHROPLASTY;  Surgeon: Ollen Gross, MD;  Location: [facility];  Service: Orthopedics;  Laterality: Right;   TUBAL LIGATION      Current Medications: Current Meds  Medication Sig   ADVAIR HFA 230-21 MCG/ACT inhaler Use  2 Inhalations  2 x /day  15 minutes apart   2 x /day (every 12 hours)   blood glucose meter kit and supplies Test sugars twice daily. Dx E11.9   Cholecalciferol (VITAMIN D3) 250 MCG (10000 UT) capsule Take  10,000 Units by mouth daily.   diltiazem (CARDIZEM CD) 300 MG 24 hr capsule TAKE 1 CAPSULE BY MOUTH EVERY DAY   docusate sodium (COLACE) 100 MG capsule Take 100 mg by mouth daily.   EPINEPHrine 0.3 mg/0.3 mL IJ SOAJ injection Inject 0.3 mg into the muscle as needed for anaphylaxis.   fexofenadine (ALLEGRA) 180 MG tablet Take 1 tablet (180 mg total) by mouth daily.   guaiFENesin (MUCINEX PO) Take by mouth as needed.   ketorolac (ACULAR) 0.5 % ophthalmic solution Place 1 drop into both eyes 4 (four) times daily.   Lancets (ONETOUCH DELICA PLUS LANCET33G) MISC TEST SUGAR TWICE A DAY   Magnesium 250 MG TABS Take 250 mg by mouth in the morning and at bedtime.   metFORMIN (GLUCOPHAGE) 500 MG tablet TAKE 1 TABLET 3 X /DAY WITH MEALS FOR DIABETES   Misc Natural Products (NEURIVA PO) Take 1 tablet by mouth daily.   mometasone (NASONEX) 50 MCG/ACT nasal spray PLACE 2 SPRAYS INTO THE NOSE DAILY.   omeprazole (PRILOSEC) 40 MG capsule TAKE ONE CAPSULE DAILY FOR INDIGESTION AND HEARTBURN   ONETOUCH ULTRA test strip USE TO TEST SUGAR TWICE A DAY   Rhubarb (ESTROVEN MENOPAUSE RELIEF PO) Take 1 capsule by mouth daily.   rosuvastatin (CRESTOR) 20 MG tablet TAKE 1 TABLET BY MOUTH EVERY DAY FOR CHOLESTEROL     Allergies:   Metronidazole and Ppd [tuberculin purified protein derivative]   Social History   Socioeconomic History   Marital status: Married    Spouse name: Not on file   Number of children: Not on file   Years of education: Not on file   Highest education level: Not on file  Occupational History   Occupation: retired  Tobacco Use   Smoking status: Never   Smokeless tobacco: Never  Vaping Use   Vaping status: Never Used  Substance and Sexual Activity   Alcohol use: No   Drug use: No   Sexual activity: Not Currently    Birth control/protection: Post-menopausal  Other Topics Concern   Not on file  Social History Narrative   Not on file   Social Determinants of Health   Financial  Resource Strain: Not on file  Food Insecurity: Not on file  Transportation Needs: Not on file  Physical Activity: Not on file  Stress: Not on file  Social Connections: Not on file     Family History: The patient's family history includes Alzheimer's disease in her father; COPD in her brother; Cirrhosis in her brother; Dementia in her mother; Diabetes in her mother; Hypertension in her brother, brother, father, and mother; Tongue cancer in her sister.  ROS:   Please see the history of present illness.     All other systems reviewed and are negative.  EKGs/Labs/Other Studies Reviewed:    The following studies were reviewed today:   EKG:   03/06/22: Atrial tachycardia with block 11/02/23:  NSR, rate 73  Recent Labs: 05/04/2023: Magnesium 1.9; TSH 0.97 08/23/2023: ALT 16; BUN 14; Creat 0.73; Hemoglobin 12.3; Platelets 286; Potassium 4.3; Sodium 139  Recent Lipid Panel    Component Value Date/Time   CHOL 167 08/23/2023 1226   TRIG 44 08/23/2023 1226   HDL 91 08/23/2023 1226   CHOLHDL 1.8 08/23/2023 1226   VLDL 28 07/08/2017 1515   LDLCALC 63 08/23/2023 1226    Physical Exam:    VS:  BP 118/82 (BP Location: Left Arm, Patient Position: Sitting, Cuff Size: Normal)   Pulse 73   Ht 5\' 3"  (1.6 m)   Wt 198 lb 3.2 oz (89.9 kg)   SpO2 97%   BMI 35.11 kg/m     Wt Readings from Last 3 Encounters:  11/02/23 198 lb 3.2 oz (89.9 kg)  08/23/23 198 lb 12.8 oz (90.2 kg)  05/04/23 196 lb 12.8 oz (89.3 kg)     GEN:  Well nourished, well developed in no acute distress HEENT: Normal NECK: No JVD; No carotid bruits CARDIAC: irregular, normal rhythm, no murmurs, rubs, gallops RESPIRATORY:  Clear to auscultation without rales, wheezing or rhonchi  ABDOMEN: Soft, non-tender, non-distended MUSCULOSKELETAL:  No edema; No deformity  SKIN: Warm and dry NEUROLOGIC:  Alert and oriented x 3 PSYCHIATRIC:  Normal affect   ASSESSMENT:    1. Precordial pain   2. SVT (supraventricular tachycardia)  ([name])   3. Essential hypertension   4. Hyperlipidemia, unspecified hyperlipidemia type      PLAN:    SVT: Was having runs of narrow complex tachycardia with rate up to 180s.  Zio patch x9 days showed reported atrial flutter (23% burden) but on review I think these episodes are more likely atrial tachycardia with average rate 125 bpm.  She was seen by Dr. Ladona Ridgel and felt to be likely SVT, underwent ablation 07/2022.  Zio patch x 14 days 08/2022 showed very brief runs (less than 4 seconds) of SVT.    Chest pain: Stress echo 12/08/2022 showed no evidence of ischemia but poor exercise capacity (4.6 METS) and frequent PVCs during exercise. -She is reporting recent episode of chest pain that sounds like typical angina, as describes exertional chest tightness.  Recommend coronary CT for further evaluation; will continue home diltiazem for study  Hypertension: Continue diltiazem 300 mg daily.  Appears controlled  Hyperlipidemia: Continue rosuvastatin 20 mg daily.  LDL 63 on 08/2023  Daytime somnolence: Sleep study 06/2022 showed no significant OSA  RTC in 6 months   Medication Adjustments/Labs and Tests Ordered: Current medicines are reviewed at length with the patient today.  Concerns regarding medicines are outlined above.  Orders Placed This Encounter  Procedures   CT CORONARY MORPH W/CTA COR W/SCORE W/CA W/CM &/OR WO/CM   Basic Metabolic Panel (BMET)   EKG 12-Lead   No orders of the defined types were placed in this encounter.   Patient Instructions  Medication Instructions:  Continue current medications Please take your Diltiazem 300 mg for your procedure *If you need a refill on your cardiac medications before your next appointment, please call your pharmacy*   Lab Work: BMET today If you have labs (blood work) drawn today and your tests are completely normal, you will receive your results only by: MyChart Message (if you have MyChart) OR A paper copy in the mail If you have any  lab test that is abnormal or we need to change your treatment, we will call you to review the results.   Testing/Procedures:  Your cardiac CT will be scheduled at one of the below locations:   [facility] [address] [place], [place] [address] [phone]   If scheduled at [facility], please arrive at the [facility] and Children's Entrance (Entrance C2) of [facility] 30 minutes prior to test start time. You can use the FREE valet parking offered at entrance C (encouraged to control the heart rate for the test)  Proceed to the [facility] Radiology Department (first floor) to check-in and test prep.  All radiology patients and guests should use entrance C2 at [facility], accessed from [facility], even though the hospital's physical address listed is [address].     Please follow these instructions carefully (unless otherwise directed):  An IV will be required for this test and Nitroglycerin will be given.    On the Night Before the Test: Be sure to Drink plenty of water. Do not consume any caffeinated/decaffeinated beverages or chocolate 12 hours prior to your test. Do not take any antihistamines 12 hours prior to your test.   On the Day of the Test: Drink plenty of water until 1 hour prior to the test. Do not eat any food 1 hour prior to test. You may take your regular medications prior to the test.  Take Diltiazem 300 mg two hours prior to test. If you take Furosemide/Hydrochlorothiazide/Spironolactone, please HOLD on the morning of the test. FEMALES- please wear underwire-free bra if available, avoid dresses & tight clothing   After the Test: Drink plenty of water. After receiving IV contrast, you may experience a mild flushed feeling. This is normal. On occasion, you may experience a mild rash up to 24 hours after the test. This is not dangerous. If this occurs, you can take Benadryl 25 mg and increase  your fluid intake. If you experience trouble breathing, this can be serious. If it is severe call 911 IMMEDIATELY. If it is mild, please call our office.  We will call to schedule your test 2-4 weeks out understanding that some insurance companies will need an authorization prior to the service being performed.   For more information and frequently asked questions, please visit our website : [email or web address]  For non-scheduling related questions, please contact the cardiac imaging nurse navigator should you have any questions/concerns: Cardiac Imaging Nurse Navigators Direct Office Dial: [phone]   For scheduling needs, including cancellations and rescheduling, please call [place], [phone].    Follow-Up: At [facility], you and your health needs are our priority.  As part of our continuing mission to provide you with exceptional heart care, we have created designated Provider Care Teams.  These Care Teams include your primary Cardiologist (physician) and Advanced Practice Providers (APPs -  Physician Assistants and Nurse Practitioners) who all work together to provide you with the care you need, when you need it.  We recommend signing up for the patient portal called "MyChart".  Sign up information is provided on this After Visit Summary.  MyChart is used to connect with patients for Virtual Visits (Telemedicine).  Patients are able to view lab/test results, encounter notes, upcoming appointments, etc.  Non-urgent messages can be sent to your provider as well.   To learn more about what you can do with MyChart, go to [email or web address].    Your next appointment:   6 month(s)  Provider:   Little Ishikawa, MD     Other Instructions none    Signed, Little Ishikawa,  MD  11/02/2023 2:13 PM    [facility]

## 2023-11-02 ENCOUNTER — Encounter: Payer: Self-pay | Admitting: Cardiology

## 2023-11-02 ENCOUNTER — Ambulatory Visit: Payer: Medicare HMO | Attending: Cardiology | Admitting: Cardiology

## 2023-11-02 VITALS — BP 118/82 | HR 73 | Ht 63.0 in | Wt 198.2 lb

## 2023-11-02 DIAGNOSIS — J301 Allergic rhinitis due to pollen: Secondary | ICD-10-CM | POA: Diagnosis not present

## 2023-11-02 DIAGNOSIS — R072 Precordial pain: Secondary | ICD-10-CM

## 2023-11-02 DIAGNOSIS — I471 Supraventricular tachycardia, unspecified: Secondary | ICD-10-CM | POA: Diagnosis not present

## 2023-11-02 DIAGNOSIS — E785 Hyperlipidemia, unspecified: Secondary | ICD-10-CM | POA: Diagnosis not present

## 2023-11-02 DIAGNOSIS — I10 Essential (primary) hypertension: Secondary | ICD-10-CM | POA: Diagnosis not present

## 2023-11-02 DIAGNOSIS — J3089 Other allergic rhinitis: Secondary | ICD-10-CM | POA: Diagnosis not present

## 2023-11-02 NOTE — Patient Instructions (Signed)
Medication Instructions:  Continue current medications Please take your Diltiazem 300 mg for your procedure *If you need a refill on your cardiac medications before your next appointment, please call your pharmacy*   Lab Work: BMET today If you have labs (blood work) drawn today and your tests are completely normal, you will receive your results only by: MyChart Message (if you have MyChart) OR A paper copy in the mail If you have any lab test that is abnormal or we need to change your treatment, we will call you to review the results.   Testing/Procedures:   Your cardiac CT will be scheduled at one of the below locations:   [facility] [address] [place], [place] [address] [phone]   If scheduled at [facility], please arrive at the [facility] and Children's Entrance (Entrance C2) of [facility] 30 minutes prior to test start time. You can use the FREE valet parking offered at entrance C (encouraged to control the heart rate for the test)  Proceed to the [facility] Radiology Department (first floor) to check-in and test prep.  All radiology patients and guests should use entrance C2 at [facility], accessed from [facility], even though the hospital's physical address listed is [address]     Please follow these instructions carefully (unless otherwise directed):  An IV will be required for this test and Nitroglycerin will be given.    On the Night Before the Test: Be sure to Drink plenty of water. Do not consume any caffeinated/decaffeinated beverages or chocolate 12 hours prior to your test. Do not take any antihistamines 12 hours prior to your test.   On the Day of the Test: Drink plenty of water until 1 hour prior to the test. Do not eat any food 1 hour prior to test. You may take your regular medications prior to the test.  Take Diltiazem 300 mg two hours prior to test. If you take  Furosemide/Hydrochlorothiazide/Spironolactone, please HOLD on the morning of the test. FEMALES- please wear underwire-free bra if available, avoid dresses & tight clothing   After the Test: Drink plenty of water. After receiving IV contrast, you may experience a mild flushed feeling. This is normal. On occasion, you may experience a mild rash up to 24 hours after the test. This is not dangerous. If this occurs, you can take Benadryl 25 mg and increase your fluid intake. If you experience trouble breathing, this can be serious. If it is severe call 911 IMMEDIATELY. If it is mild, please call our office.  We will call to schedule your test 2-4 weeks out understanding that some insurance companies will need an authorization prior to the service being performed.   For more information and frequently asked questions, please visit our website : [email or web address]  For non-scheduling related questions, please contact the cardiac imaging nurse navigator should you have any questions/concerns: Cardiac Imaging Nurse Navigators Direct Office Dial: [phone]   For scheduling needs, including cancellations and rescheduling, please call [place], [phone].    Follow-Up: At [facility], you and your health needs are our priority.  As part of our continuing mission to provide you with exceptional heart care, we have created designated Provider Care Teams.  These Care Teams include your primary Cardiologist (physician) and Advanced Practice Providers (APPs -  Physician Assistants and Nurse Practitioners) who all work together to provide you with the care you need, when you need it.  We recommend signing  up for the patient portal called "MyChart".  Sign up information is provided on this After Visit Summary.  MyChart is used to connect with patients for Virtual Visits (Telemedicine).  Patients are able to view lab/test results, encounter notes, upcoming appointments, etc.   Non-urgent messages can be sent to your provider as well.   To learn more about what you can do with MyChart, go to [email or web address].    Your next appointment:   6 month(s)  Provider:   Little Ishikawa, MD     Other Instructions none

## 2023-11-03 LAB — BASIC METABOLIC PANEL
BUN/Creatinine Ratio: 14 (ref 12–28)
BUN: 11 mg/dL (ref 8–27)
CO2: 20 mmol/L (ref 20–29)
Calcium: 9.8 mg/dL (ref 8.7–10.3)
Chloride: 104 mmol/L (ref 96–106)
Creatinine, Ser: 0.8 mg/dL (ref 0.57–1.00)
Glucose: 108 mg/dL — ABNORMAL HIGH (ref 70–99)
Potassium: 5 mmol/L (ref 3.5–5.2)
Sodium: 143 mmol/L (ref 134–144)
eGFR: 78 mL/min/{1.73_m2} (ref >59)

## 2023-11-04 ENCOUNTER — Other Ambulatory Visit: Payer: Self-pay | Admitting: Internal Medicine

## 2023-11-04 DIAGNOSIS — E1122 Type 2 diabetes mellitus with diabetic chronic kidney disease: Secondary | ICD-10-CM

## 2023-11-04 MED ORDER — METFORMIN HCL ER 500 MG PO TB24: ORAL_TABLET | ORAL | 0 refills | Status: DC

## 2023-11-05 NOTE — Telephone Encounter (Signed)
Great, thank you so much! 

## 2023-11-10 ENCOUNTER — Ambulatory Visit: Payer: Medicare HMO | Admitting: Internal Medicine

## 2023-11-12 DIAGNOSIS — J3089 Other allergic rhinitis: Secondary | ICD-10-CM | POA: Diagnosis not present

## 2023-11-12 DIAGNOSIS — J301 Allergic rhinitis due to pollen: Secondary | ICD-10-CM | POA: Diagnosis not present

## 2023-11-15 ENCOUNTER — Telehealth (HOSPITAL_COMMUNITY): Payer: Self-pay | Admitting: *Deleted

## 2023-11-15 ENCOUNTER — Encounter (HOSPITAL_COMMUNITY): Payer: Self-pay

## 2023-11-15 NOTE — Telephone Encounter (Signed)
Attempted to call patient regarding upcoming cardiac CT appointment. °Left message on voicemail with name and callback number ° °Edie Vallandingham RN Navigator Cardiac Imaging °[facility] °[phone] Office °[phone] Cell ° °

## 2023-11-16 ENCOUNTER — Ambulatory Visit (HOSPITAL_COMMUNITY)
Admission: RE | Admit: 2023-11-16 | Discharge: 2023-11-16 | Disposition: A | Discharge status: Home / Self Care | Payer: Medicare HMO | Source: Ambulatory Visit | Attending: Cardiology | Admitting: Cardiology

## 2023-11-16 DIAGNOSIS — J3081 Allergic rhinitis due to animal (cat) (dog) hair and dander: Secondary | ICD-10-CM | POA: Diagnosis not present

## 2023-11-16 DIAGNOSIS — J301 Allergic rhinitis due to pollen: Secondary | ICD-10-CM | POA: Diagnosis not present

## 2023-11-16 DIAGNOSIS — R072 Precordial pain: Secondary | ICD-10-CM | POA: Diagnosis not present

## 2023-11-16 DIAGNOSIS — J3089 Other allergic rhinitis: Secondary | ICD-10-CM | POA: Diagnosis not present

## 2023-11-16 MED ORDER — NITROGLYCERIN 0.4 MG SL SUBL
SUBLINGUAL_TABLET | SUBLINGUAL | Status: AC
  Filled 2023-11-16: qty 2

## 2023-11-16 MED ORDER — IOHEXOL 350 MG/ML SOLN
100.0000 mL | Freq: Once | INTRAVENOUS | Status: AC | PRN
  Administered 2023-11-16: 100 mL via INTRAVENOUS

## 2023-11-16 MED ORDER — NITROGLYCERIN 0.4 MG SL SUBL
0.8000 mg | SUBLINGUAL_TABLET | Freq: Once | SUBLINGUAL | Status: AC
  Administered 2023-11-16: 0.8 mg via SUBLINGUAL

## 2023-11-29 ENCOUNTER — Encounter: Payer: Self-pay | Admitting: Internal Medicine

## 2023-11-29 DIAGNOSIS — J3089 Other allergic rhinitis: Secondary | ICD-10-CM | POA: Diagnosis not present

## 2023-11-29 DIAGNOSIS — J301 Allergic rhinitis due to pollen: Secondary | ICD-10-CM | POA: Diagnosis not present

## 2023-11-29 DIAGNOSIS — J4530 Mild persistent asthma, uncomplicated: Secondary | ICD-10-CM | POA: Diagnosis not present

## 2023-11-29 DIAGNOSIS — H1045 Other chronic allergic conjunctivitis: Secondary | ICD-10-CM | POA: Diagnosis not present

## 2023-11-29 NOTE — Progress Notes (Signed)
 N  O     S  H  O  W       NEEDS a q 3 mo OV & WELLNESS - Dana  NEEDS a 6 mo OV - Ariela Mochizuki  NEEDS a 9 mo CPE w/ Lonell MORITA      ADULT   &   ADOLESCENT      INTERNAL MEDICINE  Lauren Lopez, M.D.          Lonell Rous, ANP        Tonya Cranford, FNP  [facility] [address]  [place], [place]. [address] Telephone [phone] Telefax [phone]   Future Appointments  Date Time Provider Department  11/30/2023               11:30 AM Lopez Elsie, MD [facility]  05/03/2024  2:00 PM Wilkinson, Dana E, NP [facility]  08/23/2024 11:30 AM Wilkinson, Dana E, NP [facility]    History of Present Illness:       This very nice 72 y.o. DBF presents for 3 month follow up with HTN, HLD, Pre-Diabetes and Vitamin D  Deficiency. Patient has GERD controlled with Diet & her Omeprazole .         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         Patient is treated for HTN  circa 2013 & BP has been controlled at home. Today's BP is at goal -                .  In Sept 2023, patient had an ablation by Dr  Waddell for SVT.  In Jan this year , she had a low risk or Negative Stress Echo thru Dr Alvan office .   Patient has had no complaints of any cardiac type chest pain, palpitations, dyspnea chet /PND, dizziness, claudication or dependent edema.        Hyperlipidemia is controlled with diet & meds. Patient denies myalgias or other med  SE's. Last Lipids were at goal :  Lab Results  Component Value Date   CHOL 167 08/23/2023   HDL 91 08/23/2023   LDLCALC  63 08/23/2023   TRIG 44 08/23/2023   CHOLHDL 1.8 08/23/2023     Also, the patient has history of Morbid obesity (BMI 35+) and history of T2_NIDDM  w/CKD2  (July 2019 ) for which she was started on Metformin .  She denies any symptoms of reactive hypoglycemia, diabetic polys, paresthesias or visual blurring.  Last A1c was not at goal :  Lab Results  Component Value Date   HGBA1C 6.8 (H) 08/23/2023                                                      Further, the patient also has history of Vitamin D  Deficiency (24 /2019) and supplements vitamin D  . Last vitamin D  was  low :  Lab Results  Component Value Date   VD25OH 53 05/04/2023         Current Outpatient Medications on File Prior to Visit  Medication Sig   ADVAIR  HFA 230-21 MCG/ACT inhaler Use  2 Inhalations  2 x /day       VITAMIN D  10,000 u Take 10,000 Units daily.   diltiazem  CD 300 MG 24 hr capsule Take 1 capsule  daily.   COLACE 100 MG capsule Take daily.   EPINEPHrine  0.3 mg/0.3 mL injec Inject 0.3 mg into muscle as needed - anaphylaxis.   fexofenadine   180 MG tablet Take 1 tablet daily.   guaiFENesin   Take as needed   XALATAN  0.005 % ophth soln Place 1 drop into both eyes at bedtime.   Magnesium  250 MG TABS Take  in the morning and at bedtime.   metFORMIN  500 MG XR  tablet TAKE 1 TABLET 2 X /DAY WITH MEALS FOR DIABETES   Misc Natural Products (NEURIVA ) Take 1 tablet  daily.   NASONEX  nasal spray PLACE 2 SPRAYS INTO THE NOSE DAILY.   NITROSTAT  0.4 MG SL tablet as needed for chest pain.   omeprazole   40 MG capsule Take one capsule daily   Rhubarb (ESTROVEN MENOPAUSE RELIEF ) Take 1 capsule daily.   rosuvastatin   20 MG tablet Take  1 tablet  Daily     Allergies  Allergen Reactions   Metronidazole Swelling    Coats tongue white and tongue swelling    Ppd [Tuberculin Purified Protein  Derivative] Other (See Comments)    + PPD 2013 with NEG CXR 05/2012 Redness and severe swelling at injection site     PMHx:   Past Medical History:  Diagnosis Date   Acute asthma exacerbation 02/20/2022   Arthritis    right hand, back (04/03/2016)   Asthma    takes weekly allergy  shots, none in 5-6 weeks due to knee surgery   Bilateral carpal tunnel syndrome    Chronic lower back pain    GERD (gastroesophageal reflux disease)    Hyperlipidemia    Hypertension    Multiple allergies    Pneumonia 03/2012   Positive PPD    they told me it wasn't positive; I was allergic to the test itself   Right rotator cuff tear 01/20/2013   Type II diabetes mellitus ([name])    Vasculitis ([name]) 04/03/2016   Vitamin deficiency    Wears dentures    upper   Wears glasses      Immunization History  Administered Date(s) Administered   Influenza, High Dose Seasonal PF 11/10/2017,  09/15/2018, 09/04/2019, 04/26/2020, 10/14/2021, 09/02/2022   Influenza 08/30/2020   PFIZER-SARS-COV-2 Vacc 12/22/2019, 01/12/2020, 04/26/2020, 04/01/2021   Pneumococcal -13 12/22/2018   Pneumococcal -23 02/04/2017, 04/25/2021, 10/27/2021   Pneumococcal-23 06/27/2013   Tdap 04/23/2012   Zoster, Live 07/31/2015     Past Surgical History:  Procedure Laterality Date   ABDOMINAL HYSTERECTOMY     BILATERAL SALPINGOOPHORECTOMY  11/28/2010   open laparoscopy with adhesiolysis also   CARPAL TUNNEL RELEASE  07/15/2012   Procedure: CARPAL TUNNEL RELEASE;  Surgeon: Lamar LULLA Leonor Mickey., MD;  Location: [facility];  Service: Orthopedics;  Laterality: Left;   CARPAL TUNNEL RELEASE Right 05/31/2014   Procedure: RIGHT CARPAL TUNNEL RELEASE;  Surgeon: Lamar Leonor Mickey LULLA, MD;  Location: [facility];  Service: Orthopedics;  Laterality: Right;   COLONOSCOPY     KNEE ARTHROSCOPY Bilateral 2005-2016   right-left   SHOULDER ARTHROSCOPY WITH ROTATOR CUFF REPAIR AND SUBACROMIAL DECOMPRESSION Right 01/20/2013    Procedure: RIGHT ARTHROSCOPY SHOULDER DEBRIDEMENT LIMITED, ARTHROSCOPY SHOULDER DECOMPRESSION SUBACROMIAL PARTIAL ACROMIOPLASTY WITH CORACOACROMIAL RELEASE, ROTATOR CUFF REPAIR ;  Surgeon: Fonda SHAUNNA Olmsted, MD;  Location: [facility];  Service: Orthopedics;  Laterality: Right;  RIGHT SHOULDER SCOPE DEBRIDEMENT, ACRIOMIOPLASTY, ROTATOR CUFF REPAIR   SVT ABLATION N/A 08/17/2022   Procedure: SVT ABLATION;  Surgeon: Waddell Danelle ORN, MD;  Location: [facility];  Service: Cardiovascular;  Laterality: N/A;   TOTAL KNEE ARTHROPLASTY Right 02/02/2022   Procedure: TOTAL KNEE ARTHROPLASTY;  Surgeon: Melodi Lerner, MD;  Location: [facility];  Service: Orthopedics;  Laterality: Right;   TUBAL LIGATION       FHx:    Reviewed / unchanged   SHx:    Reviewed / unchanged    Systems Review:  Constitutional: Denies fever, chills, wt changes, headaches, insomnia, fatigue, night sweats, change in appetite. Eyes: Denies redness, blurred vision, diplopia, discharge, itchy, watery eyes.  ENT: Denies discharge, congestion, post nasal drip, epistaxis, sore throat, earache, hearing loss, dental pain, tinnitus, vertigo, sinus pain, snoring.  CV: Denies chest pain, palpitations, irregular heartbeat, syncope, dyspnea, diaphoresis, orthopnea, PND, claudication or edema. Respiratory: denies cough, dyspnea, DOE, pleurisy, hoarseness, laryngitis, wheezing.  Gastrointestinal: Denies dysphagia, odynophagia, heartburn, reflux, water brash, abdominal pain or cramps, nausea, vomiting, bloating, diarrhea, constipation, hematemesis, melena, hematochezia  or hemorrhoids. Genitourinary: Denies dysuria, frequency, urgency, nocturia, hesitancy, discharge, hematuria or flank pain. Musculoskeletal: Denies arthralgias, myalgias, stiffness, jt. swelling, pain, limping or strain/sprain.  Skin: Denies pruritus, rash, hives, warts, acne, eczema or change in skin lesion(s). Neuro: No weakness, tremor, incoordination, spasms,  paresthesia or pain. Psychiatric: Denies confusion, memory loss or sensory loss. Endo: Denies change in weight, skin or hair change.  Heme/Lymph: No excessive bleeding, bruising or enlarged lymph nodes.   Physical Exam  There were no vitals taken for this visit.  Appears  well nourished, well groomed  and in no distress.  Eyes: PERRLA, EOMs, conjunctiva no swelling or erythema. Sinuses: No frontal/maxillary tenderness ENT/Mouth: EAC's clear, TM's nl w/o erythema, bulging. Nares clear w/o erythema, swelling, exudates. Oropharynx clear without erythema or exudates. Oral hygiene is good. Tongue normal, non obstructing. Hearing intact.  Neck: Supple. Thyroid  not palpable. Car 2+/2+ without bruits, nodes or JVD. Chest: Respirations nl with BS clear & equal w/o rales, rhonchi, wheezing or stridor.  Cor: Heart sounds normal w/ regular rate and rhythm without sig. murmurs, gallops, clicks or rubs. Peripheral pulses normal and equal  without edema.  Abdomen: Soft & bowel sounds normal. Non-tender w/o guarding,  rebound, hernias, masses or organomegaly.  Lymphatics: Unremarkable.  Musculoskeletal: Full ROM all peripheral extremities, joint stability, 5/5 strength and normal gait.  Skin: Warm, dry without exposed rashes or ecchymosis apparent. Appears to have a lipoma of her upper back . Neuro: Cranial nerves intact, reflexes equal bilaterally. Sensory-motor testing grossly intact. Tendon reflexes grossly intact.  Pysch: Alert & oriented x 3.  Insight and judgement nl & appropriate. No ideations.   Assessment and Plan:  1. Essential hypertension  - Continue medication, monitor blood pressure at home.  - Continue DASH diet.  Reminder to go to the ER if any CP,  SOB, nausea, dizziness, severe HA, changes vision/speech.    - CBC with Differential/Platelet - COMPLETE METABOLIC PANEL WITH GFR - Magnesium  - TSH   2. Hyperlipidemia associated with type 2 diabetes mellitus ([name])  - Continue  diet/meds, exercise,& lifestyle modifications.  - Continue monitor periodic cholesterol/liver & renal functions     - Lipid panel - TSH   3. Type 2 diabetes mellitus with stage 2 chronic kidney  disease, without long-term current use of insulin  ([name])  - Continue diet, exercise  - Lifestyle modifications.  - Monitor appropriate labs  - Hemoglobin A1c - Insulin , random - Urinalysis, Routine w reflex microscopic - Microalbumin / creatinine urine ratio   4. Vitamin D  deficiency  - Continue supplementation    - VITAMIN D  25 Hydrox   5. Medication management  - CBC with Differential/Platelet - COMPLETE METABOLIC PANEL WITH GFR - Magnesium  - Lipid panel - TSH - Hemoglobin A1c - Insulin , random - VITAMIN D  25 Hydroxy          Discussed  regular exercise, BP monitoring, weight control to achieve/maintain BMI less than 25 and discussed med and SE's. Recommended labs to assess /monitor clinical status .  I discussed the assessment and treatment plan with the patient. The patient was provided an opportunity to ask questions and all were answered. The patient agreed with the plan and demonstrated an understanding of the instructions.  I provided over 30 minutes of exam, counseling, chart review and  complex critical decision making.        The patient was advised to call back or seek an in-person evaluation if the symptoms worsen or if the condition fails to improve as anticipated.   Lauren JONETTA Richards, MD

## 2023-11-30 ENCOUNTER — Ambulatory Visit: Payer: Medicare HMO | Admitting: Internal Medicine

## 2023-11-30 DIAGNOSIS — Z79899 Other long term (current) drug therapy: Secondary | ICD-10-CM

## 2023-11-30 DIAGNOSIS — E1169 Type 2 diabetes mellitus with other specified complication: Secondary | ICD-10-CM

## 2023-11-30 DIAGNOSIS — I10 Essential (primary) hypertension: Secondary | ICD-10-CM

## 2023-11-30 DIAGNOSIS — N182 Chronic kidney disease, stage 2 (mild): Secondary | ICD-10-CM

## 2023-11-30 DIAGNOSIS — E559 Vitamin D deficiency, unspecified: Secondary | ICD-10-CM

## 2023-12-07 DIAGNOSIS — Z860101 Personal history of adenomatous and serrated colon polyps: Secondary | ICD-10-CM | POA: Diagnosis not present

## 2023-12-10 DIAGNOSIS — J3081 Allergic rhinitis due to animal (cat) (dog) hair and dander: Secondary | ICD-10-CM | POA: Diagnosis not present

## 2023-12-10 DIAGNOSIS — J301 Allergic rhinitis due to pollen: Secondary | ICD-10-CM | POA: Diagnosis not present

## 2023-12-13 ENCOUNTER — Ambulatory Visit (INDEPENDENT_AMBULATORY_CARE_PROVIDER_SITE_OTHER): Payer: Medicare HMO | Admitting: Internal Medicine

## 2023-12-13 ENCOUNTER — Ambulatory Visit: Payer: Medicare HMO | Admitting: Internal Medicine

## 2023-12-13 ENCOUNTER — Encounter: Payer: Self-pay | Admitting: Internal Medicine

## 2023-12-13 VITALS — BP 130/80 | HR 83 | Temp 97.9°F | Resp 17 | Ht 63.0 in | Wt 200.2 lb

## 2023-12-13 DIAGNOSIS — M79604 Pain in right leg: Secondary | ICD-10-CM

## 2023-12-13 DIAGNOSIS — M545 Low back pain, unspecified: Secondary | ICD-10-CM | POA: Diagnosis not present

## 2023-12-13 DIAGNOSIS — E559 Vitamin D deficiency, unspecified: Secondary | ICD-10-CM

## 2023-12-13 DIAGNOSIS — I10 Essential (primary) hypertension: Secondary | ICD-10-CM

## 2023-12-13 DIAGNOSIS — E1169 Type 2 diabetes mellitus with other specified complication: Secondary | ICD-10-CM

## 2023-12-13 DIAGNOSIS — M7062 Trochanteric bursitis, left hip: Secondary | ICD-10-CM | POA: Diagnosis not present

## 2023-12-13 DIAGNOSIS — M79605 Pain in left leg: Secondary | ICD-10-CM | POA: Diagnosis not present

## 2023-12-13 DIAGNOSIS — N182 Chronic kidney disease, stage 2 (mild): Secondary | ICD-10-CM | POA: Diagnosis not present

## 2023-12-13 DIAGNOSIS — E785 Hyperlipidemia, unspecified: Secondary | ICD-10-CM | POA: Diagnosis not present

## 2023-12-13 DIAGNOSIS — Z79899 Other long term (current) drug therapy: Secondary | ICD-10-CM | POA: Diagnosis not present

## 2023-12-13 DIAGNOSIS — E1122 Type 2 diabetes mellitus with diabetic chronic kidney disease: Secondary | ICD-10-CM

## 2023-12-13 MED ORDER — TIRZEPATIDE 2.5 MG/0.5ML ~~LOC~~ SOAJ: SUBCUTANEOUS | 0 refills | Status: DC

## 2023-12-13 NOTE — Progress Notes (Signed)
 [facility]      ADULT   &   ADOLESCENT      INTERNAL MEDICINE  Lauren Lopez, M.D.          Lauren Lopez, ANP        Lauren Cranford, FNP  [facility] [address]  [place], [place]. [address] Telephone [phone] Telefax [phone]   Future Appointments  Date Time Provider Department  12/13/2023  3:30 PM Lopez Elsie, MD [facility]  03/01/2024                    wellness 11:30 AM Jude Lauren BRAVO, NP [facility]  06/08/2024                     9 mo ov  11:30 AM Lopez Elsie, MD [facility]  09/12/2024                     cpe 10:00 AM Wilkinson, Dana E, NP [facility]    History of Present Illness:       This very nice 73 y.o. DBF presents for 3 month follow up with HTN, HLD, T2_NIDDM w/CKD2  and Vitamin D  Deficiency. Patient has GERD controlled with Diet & her Omeprazole .            Today patient is also c/o LBP  with sciatica  to both buttocks & LLE>RLE. Had LS & hip/pelvic Xrays in 2019 by Dr Harden & referred to Dr Eldonna for EDS injections which she relates were not helpful. She desires further evaluation.         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         Patient is treated for HTN  circa 2013 & BP has been controlled at home. Today's BP is at goal -130/80 .  In Sept 2023, patient had an ablation by Dr  Waddell for SVT.  In Jan this year , she had a low risk or Negative Stress Echo thru Dr Alvan office .   Patient has had no complaints of any cardiac type chest pain, palpitations, dyspnea chet /PND, dizziness, claudication or dependent edema.        Hyperlipidemia is controlled with diet & meds. Patient denies myalgias or other med SE's. Last Lipids were at goal :  Lab Results  Component Value Date   CHOL 167 08/23/2023   HDL 91 08/23/2023   LDLCALC 63 08/23/2023   TRIG 44 08/23/2023   CHOLHDL 1.8 08/23/2023     Also,  the patient has history of Morbid obesity (BMI 35+) and history of T2_NIDDM  w/CKD2  (July 2019 ) for which she was started on Metformin .  She denies any symptoms of reactive hypoglycemia, diabetic polys, paresthesias or visual blurring.  Last A1c was not at goal :  Lab Results  Component Value Date   HGBA1C 6.8 (H) 08/23/2023                                                      Further, the patient also has history of Vitamin D  Deficiency (24 /2019) and supplements vitamin D  . Last vitamin D  was  low :  Lab Results  Component Value Date   VD25OH  53 05/04/2023       Current Outpatient Medications on File Prior to Visit  Medication Sig   ADVAIR  HFA 230-21 MCG/ACT inhaler Use  2 Inhalations  2 x /day       VITAMIN D  10,000 u Take 10,000 Units daily.   diltiazem  CD 300 MG 24 hr capsule Take 1 capsule  daily.   COLACE 100 MG capsule Take daily.   EPINEPHrine  0.3 mg/0.3 mL injec Inject 0.3 mg into muscle as needed - anaphylaxis.   fexofenadine   180 MG tablet Take 1 tablet daily.   guaiFENesin   Take as needed   XALATAN  0.005 % ophth soln Place 1 drop into both eyes at bedtime.   Magnesium  250 MG TABS Take  in the morning and at bedtime.   metFORMIN  500 MG XR  tablet TAKE 1 TABLET 2 X /DAY WITH MEALS FOR DIABETES   Misc Natural Products (NEURIVA ) Take 1 tablet  daily.   NASONEX  nasal spray PLACE 2 SPRAYS INTO THE NOSE DAILY.   NITROSTAT  0.4 MG SL tablet as needed for chest pain.   omeprazole   40 MG capsule Take one capsule daily   Rhubarb (MENOPAUSE RELIEF ) Take 1 capsule daily.   rosuvastatin   20 MG tablet Take  1 tablet  Daily     Allergies  Allergen Reactions   Metronidazole Swelling    Coats tongue white and tongue swelling    Ppd [Tuberculin Purified Protein Derivative] Other (See Comments)    + PPD 2013 with NEG CXR 05/2012 Redness and severe swelling at injection site     PMHx:   Past Medical History:  Diagnosis Date   Acute asthma exacerbation 02/20/2022    Arthritis    right hand, back (04/03/2016)   Asthma    takes weekly allergy  shots, none in 5-6 weeks due to knee surgery   Bilateral carpal tunnel syndrome    Chronic lower back pain    GERD (gastroesophageal reflux disease)    Hyperlipidemia    Hypertension    Multiple allergies    Pneumonia 03/2012   Positive PPD    they told me it wasn't positive; I was allergic to the test itself   Right rotator cuff tear 01/20/2013   Type II diabetes mellitus ([name])    Vasculitis ([name]) 04/03/2016   Vitamin deficiency    Wears dentures    upper   Wears glasses      Immunization History  Administered Date(s) Administered   Influenza, High Dose Seasonal PF 11/10/2017, 09/15/2018, 09/04/2019, 04/26/2020, 10/14/2021, 09/02/2022   Influenza 08/30/2020   PFIZER-SARS-COV-2 Vacc 12/22/2019, 01/12/2020, 04/26/2020, 04/01/2021   Pneumococcal -13 12/22/2018   Pneumococcal -23 02/04/2017, 04/25/2021, 10/27/2021   Pneumococcal-23 06/27/2013   Tdap 04/23/2012   Zoster, Live 07/31/2015     Past Surgical History:  Procedure Laterality Date   ABDOMINAL HYSTERECTOMY     BILATERAL SALPINGOOPHORECTOMY  11/28/2010   open laparoscopy with adhesiolysis also   CARPAL TUNNEL RELEASE  07/15/2012   Procedure: CARPAL TUNNEL RELEASE;  Surgeon: Lamar LULLA Leonor Mickey., MD;  Location: [facility];  Service: Orthopedics;  Laterality: Left;   CARPAL TUNNEL RELEASE Right 05/31/2014   Procedure: RIGHT CARPAL TUNNEL RELEASE;  Surgeon: Lamar Leonor Mickey LULLA, MD;  Location: [facility];  Service: Orthopedics;  Laterality: Right;   COLONOSCOPY     KNEE ARTHROSCOPY Bilateral 2005-2016   right-left   SHOULDER ARTHROSCOPY WITH ROTATOR CUFF REPAIR AND SUBACROMIAL DECOMPRESSION Right 01/20/2013   Procedure: RIGHT  ARTHROSCOPY SHOULDER DEBRIDEMENT LIMITED, ARTHROSCOPY SHOULDER DECOMPRESSION SUBACROMIAL PARTIAL ACROMIOPLASTY WITH CORACOACROMIAL RELEASE, ROTATOR CUFF REPAIR ;  Surgeon: Fonda SHAUNNA Olmsted, MD;   Location: [facility];  Service: Orthopedics;  Laterality: Right;  RIGHT SHOULDER SCOPE DEBRIDEMENT, ACRIOMIOPLASTY, ROTATOR CUFF REPAIR   SVT ABLATION N/A 08/17/2022   Procedure: SVT ABLATION;  Surgeon: Waddell Danelle ORN, MD;  Location: [facility];  Service: Cardiovascular;  Laterality: N/A;   TOTAL KNEE ARTHROPLASTY Right 02/02/2022   Procedure: TOTAL KNEE ARTHROPLASTY;  Surgeon: Melodi Lerner, MD;  Location: [facility];  Service: Orthopedics;  Laterality: Right;   TUBAL LIGATION      FHx:    Reviewed / unchanged   SHx:    Reviewed / unchanged    Systems Review:  Constitutional: Denies fever, chills, wt changes, headaches, insomnia, fatigue, night sweats, change in appetite. Eyes: Denies redness, blurred vision, diplopia, discharge, itchy, watery eyes.  ENT: Denies discharge, congestion, post nasal drip, epistaxis, sore throat, earache, hearing loss, dental pain, tinnitus, vertigo, sinus pain, snoring.  CV: Denies chest pain, palpitations, irregular heartbeat, syncope, dyspnea, diaphoresis, orthopnea, PND, claudication or edema. Respiratory: denies cough, dyspnea, DOE, pleurisy, hoarseness, laryngitis, wheezing.  Gastrointestinal: Denies dysphagia, odynophagia, heartburn, reflux, water brash, abdominal pain or cramps, nausea, vomiting, bloating, diarrhea, constipation, hematemesis, melena, hematochezia  or hemorrhoids. Genitourinary: Denies dysuria, frequency, urgency, nocturia, hesitancy, discharge, hematuria or flank pain. Musculoskeletal: Denies arthralgias, myalgias, stiffness, jt. swelling, pain, limping or strain/sprain.  Skin: Denies pruritus, rash, hives, warts, acne, eczema or change in skin lesion(s). Neuro: No weakness, tremor, incoordination, spasms, paresthesia or pain. Psychiatric: Denies confusion, memory loss or sensory loss. Endo: Denies change in weight, skin or hair change.  Heme/Lymph: No excessive bleeding, bruising or enlarged lymph  nodes.   Physical Exam  BP 130/80   Pulse 83   Temp 97.9 F (36.6 C)   Resp 17   Ht 5' 3 (1.6 m)   Wt 200 lb 3.2 oz (90.8 kg)   SpO2 96%   BMI 35.46 kg/m   Appears  over nourished, well groomed  and in no distress.  Eyes: PERRLA, EOMs, conjunctiva no swelling or erythema. Sinuses: No frontal/maxillary tenderness ENT/Mouth: EAC's clear, TM's nl w/o erythema, bulging. Nares clear w/o erythema, swelling, exudates. Oropharynx clear without erythema or exudates. Oral hygiene is good. Tongue normal, non obstructing. Hearing intact.  Neck: Supple. Thyroid  not palpable. Car 2+/2+ without bruits, nodes or JVD. Chest: Respirations nl with BS clear & equal w/o rales, rhonchi, wheezing or stridor.  Cor: Heart sounds normal w/ regular rate and rhythm without sig. murmurs, gallops, clicks or rubs. Peripheral pulses normal and equal  without edema.  Abdomen: Soft & bowel sounds normal. Non-tender w/o guarding, rebound, hernias, masses or organomegaly.  Lymphatics: Unremarkable.  Musculoskeletal: Full ROM all peripheral extremities, joint stability, 5/5 strength and normal gait. Tender over Lt  Gr Trochanteric bursa.  Skin: Warm, dry without exposed rashes or ecchymosis apparent.  Neuro: Cranial nerves intact . Sensory-motor testing grossly intact. Tendon reflexes  flat in LE's . Neg SLR.  Pysch: Alert & oriented x 3.  Insight and judgement nl & appropriate. No ideations.   Assessment and Plan:   1. Essential hypertension (Primary)  - Continue medication, monitor blood pressure at home.  - Continue DASH diet.  Reminder to go to the ER if any CP,  SOB, nausea, dizziness, severe HA, changes vision/speech.     - CBC with Differential/Platelet - COMPLETE METABOLIC PANEL WITH GFR - Magnesium  -  TSH   2. Hyperlipidemia associated with type 2 diabetes mellitus ([name])  - Continue diet/meds, exercise,& lifestyle modifications.  - Continue monitor periodic cholesterol/liver & renal functions     - Lipid panel - TSH   3. Type 2 diabetes mellitus with stage 2 chronic kidney                                                   disease, without long-term current use of insulin  ([name])  - Continue diet, exercise  - Lifestyle modifications.  - Monitor appropriate labs    - Hemoglobin A1c - Insulin , random - tirzepatide  (MOUNJARO ) 2.5 MG/0.5ML Pen;    Inject 1 pen (2.5 mg)   into  Skin every 7 days for Diabetes    Dispense: 2 mL; Refill: 0   4. Vitamin D  deficiency  - Continue supplementation  - VITAMIN D  25 Hydroxy    5. Low back pain radiating to both legs  - Ambulatory referral to Orthopedic Surgery   6. Greater trochanteric bursitis of left hip  - Ambulatory referral to Orthopedic Surgery   7. Medication management  - CBC with Differential/Platelet - COMPLETE METABOLIC PANEL WITH GFR - Magnesium  - Lipid panel - TSH - Hemoglobin A1c - Insulin , random - VITAMIN D  25 Hydroxy        Discussed  regular exercise, BP monitoring, weight control to achieve/maintain BMI less than 25 and discussed med and SE's. Recommended labs to assess /monitor clinical status .  I discussed the assessment and treatment plan with the patient. The patient was provided an opportunity to ask questions and all were answered. The patient agreed with the plan and demonstrated an understanding of the instructions.  I provided over 30 minutes of exam, counseling, chart review and  complex critical decision making.        The patient was advised to call back or seek an in-person evaluation if the symptoms worsen or if the condition fails to improve as anticipated.   Lauren JONETTA Richards, MD

## 2023-12-13 NOTE — Patient Instructions (Signed)

## 2023-12-14 LAB — CBC WITH DIFFERENTIAL/PLATELET
Absolute Lymphocytes: 3247 {cells}/uL (ref 850–3900)
Absolute Monocytes: 323 {cells}/uL (ref 200–950)
Basophils Absolute: 20 {cells}/uL (ref 0–200)
Basophils Relative: 0.3 %
Eosinophils Absolute: 139 {cells}/uL (ref 15–500)
Eosinophils Relative: 2.1 %
HCT: 39.2 % (ref 35.0–45.0)
Hemoglobin: 11.8 g/dL (ref 11.7–15.5)
MCH: 26.3 pg — ABNORMAL LOW (ref 27.0–33.0)
MCHC: 30.1 g/dL — ABNORMAL LOW (ref 32.0–36.0)
MCV: 87.3 fL (ref 80.0–100.0)
MPV: 12.2 fL (ref 7.5–12.5)
Monocytes Relative: 4.9 %
Neutro Abs: 2871 {cells}/uL (ref 1500–7800)
Neutrophils Relative %: 43.5 %
Platelets: 249 10*3/uL (ref 140–400)
RBC: 4.49 10*6/uL (ref 3.80–5.10)
RDW: 13.9 % (ref 11.0–15.0)
Total Lymphocyte: 49.2 %
WBC: 6.6 10*3/uL (ref 3.8–10.8)

## 2023-12-14 LAB — COMPLETE METABOLIC PANEL WITH GFR
AG Ratio: 1.9 (calc) (ref 1.0–2.5)
ALT: 17 U/L (ref 6–29)
AST: 17 U/L (ref 10–35)
Albumin: 4.4 g/dL (ref 3.6–5.1)
Alkaline phosphatase (APISO): 106 U/L (ref 37–153)
BUN: 11 mg/dL (ref 7–25)
CO2: 25 mmol/L (ref 20–32)
Calcium: 9.4 mg/dL (ref 8.6–10.4)
Chloride: 107 mmol/L (ref 98–110)
Creat: 0.78 mg/dL (ref 0.60–1.00)
Globulin: 2.3 g/dL (ref 1.9–3.7)
Glucose, Bld: 124 mg/dL — ABNORMAL HIGH (ref 65–99)
Potassium: 4 mmol/L (ref 3.5–5.3)
Sodium: 141 mmol/L (ref 135–146)
Total Bilirubin: 0.5 mg/dL (ref 0.2–1.2)
Total Protein: 6.7 g/dL (ref 6.1–8.1)
eGFR: 81 mL/min/{1.73_m2} (ref >=60)

## 2023-12-14 LAB — LIPID PANEL
Cholesterol: 165 mg/dL (ref <200)
HDL: 86 mg/dL (ref >=50)
LDL Cholesterol (Calc): 62 mg/dL
Non-HDL Cholesterol (Calc): 79 mg/dL (ref <130)
Total CHOL/HDL Ratio: 1.9 (calc) (ref <5.0)
Triglycerides: 83 mg/dL (ref <150)

## 2023-12-14 LAB — TSH: TSH: 1.09 m[IU]/L (ref 0.40–4.50)

## 2023-12-14 LAB — MAGNESIUM: Magnesium: 1.7 mg/dL (ref 1.5–2.5)

## 2023-12-14 LAB — HEMOGLOBIN A1C
Hgb A1c MFr Bld: 6.6 %{Hb} — ABNORMAL HIGH (ref <5.7)
Mean Plasma Glucose: 143 mg/dL
eAG (mmol/L): 7.9 mmol/L

## 2023-12-14 LAB — INSULIN, RANDOM: Insulin: 73.2 u[IU]/mL — ABNORMAL HIGH

## 2023-12-14 LAB — VITAMIN D 25 HYDROXY (VIT D DEFICIENCY, FRACTURES): Vit D, 25-Hydroxy: 38 ng/mL (ref 30–100)

## 2023-12-14 NOTE — Progress Notes (Signed)
 - Test results slightly outside the reference range are not unusual. If there is anything important, I will review this with you,  otherwise it is considered normal test values.  If you have further questions,  please do not hesitate to contact me at the office or via My Chart.   =========================================================================  -  A1c = 6.6% - I strongly encourage you to get the Mounjaro                                                                                       to lose the weight & cure your Diabetes  !  =========================================================================  -  Insulin  = 73.2 ( normal is less than 20) &                                                            shows insulin  resistance - a sign of early diabetes and   associated with a 300 % greater risk for                                           heart attacks, strokes, cancer & Alzheimer type vascular dementia   - All this can be cured  and prevented with losing weight   - get Dr Marty Fuhrman's book   the End of Diabetes    and        the End of Dieting   ( [employer] is a good web site to [employer] at a good price  )   =========================================================================  -   Magnesium  = 1.7 is very, very  low- goal is betw 2.0 - 2.5,   - So..............SABRA  Recommend that you take                                                                                 Magnesium  500 mg tablet 3 x / day with meals   - also important to eat lots of  leafy green vegetables   - spinach - Kale - collards - greens - okra - asparagus - broccoli - quinoa   - squash - almonds   - black, red, white beans  -  peas - green beans  =========================================================================  -  Chol = 165  &  LDL = 62  -Both  Excellent   - Very low risk for Heart Attack  /  Stroke  =========================================================================  -  Vitamin D = 38 is Very  Low   - Vitamin D  goal is between 70-100.   - Please make sure  that you are taking your Vitamin D  10,000 units / say as directed.   - It is very important as a natural anti-inflammatory and helping the                          immune system protect against viral infections, like Flu  & the Covid    - Also helps hair, skin, and nails, as well as reducing stroke and heart attack risk.   - It helps your bones  &  and helps with mood.  - It also decreases numerous cancer risks, so please                                                                                           take it as directed.   - Low Vit D is associated with a 200-300% higher risk for CANCER   and 200-300% higher risk for HEART   ATTACK  &  STROKE.    - It is also associated with higher death rate at younger ages,   autoimmune diseases like Rheumatoid arthritis, Lupus, Multiple Sclerosis.     - Also many other serious conditions, like depression, Alzheimer's Dementia                                                                             muscle aches, fatigue, fibromyalgia   =========================================================================  -  All Else - CBC - Kidneys - Electrolytes - Liver & Thyroid     - all  Normal / OK  =========================================================================

## 2023-12-16 DIAGNOSIS — J3089 Other allergic rhinitis: Secondary | ICD-10-CM | POA: Diagnosis not present

## 2023-12-16 DIAGNOSIS — J301 Allergic rhinitis due to pollen: Secondary | ICD-10-CM | POA: Diagnosis not present

## 2023-12-16 DIAGNOSIS — J3081 Allergic rhinitis due to animal (cat) (dog) hair and dander: Secondary | ICD-10-CM | POA: Diagnosis not present

## 2023-12-16 DIAGNOSIS — H33322 Round hole, left eye: Secondary | ICD-10-CM | POA: Diagnosis not present

## 2023-12-16 DIAGNOSIS — H401131 Primary open-angle glaucoma, bilateral, mild stage: Secondary | ICD-10-CM | POA: Diagnosis not present

## 2023-12-16 DIAGNOSIS — E113211 Type 2 diabetes mellitus with mild nonproliferative diabetic retinopathy with macular edema, right eye: Secondary | ICD-10-CM | POA: Diagnosis not present

## 2023-12-16 DIAGNOSIS — H43822 Vitreomacular adhesion, left eye: Secondary | ICD-10-CM | POA: Diagnosis not present

## 2023-12-16 DIAGNOSIS — E113412 Type 2 diabetes mellitus with severe nonproliferative diabetic retinopathy with macular edema, left eye: Secondary | ICD-10-CM | POA: Diagnosis not present

## 2023-12-16 DIAGNOSIS — H35352 Cystoid macular degeneration, left eye: Secondary | ICD-10-CM | POA: Diagnosis not present

## 2023-12-16 DIAGNOSIS — H43821 Vitreomacular adhesion, right eye: Secondary | ICD-10-CM | POA: Diagnosis not present

## 2023-12-16 DIAGNOSIS — H33321 Round hole, right eye: Secondary | ICD-10-CM | POA: Diagnosis not present

## 2023-12-21 ENCOUNTER — Ambulatory Visit (INDEPENDENT_AMBULATORY_CARE_PROVIDER_SITE_OTHER): Payer: Medicare HMO | Admitting: Orthopaedic Surgery

## 2023-12-21 ENCOUNTER — Other Ambulatory Visit (INDEPENDENT_AMBULATORY_CARE_PROVIDER_SITE_OTHER): Payer: Self-pay

## 2023-12-21 VITALS — BP 151/95 | HR 65

## 2023-12-21 DIAGNOSIS — M5441 Lumbago with sciatica, right side: Secondary | ICD-10-CM

## 2023-12-21 DIAGNOSIS — M5442 Lumbago with sciatica, left side: Secondary | ICD-10-CM

## 2023-12-21 DIAGNOSIS — G8929 Other chronic pain: Secondary | ICD-10-CM | POA: Diagnosis not present

## 2023-12-21 DIAGNOSIS — M4316 Spondylolisthesis, lumbar region: Secondary | ICD-10-CM

## 2023-12-21 NOTE — Progress Notes (Signed)
Office Visit Note   Patient: Lauren Lopez           Date of Birth: August 24, 1951           MRN: [id number] Visit Date: 12/21/2023              Requested by: Lucky Cowboy, MD [address] [place],  [place] [address] PCP: Lucky Cowboy, MD   Assessment & Plan: Visit Diagnoses:  1. Chronic bilateral low back pain with bilateral sciatica   2. Anterolisthesis of lumbar spine     Plan: Patient with degenerative anterolisthesis L3-4.  Previously she has had therapy which actually made a little worse she has had epidural injections facet injections and even facet rhizotomy.  She states the rhizotomy really did not give her any sustained relief but she did better with the epidural.  She is having lateral recess stenosis with radicular symptoms in the left leg but no neurogenic claudication.  She still goes everyplace she would like to go and if her symptoms progress she will call about getting new imaging studies and we discussed surgical treatment options with her anterolisthesis might require single level fusion with decompression.  Follow-Up Instructions: No follow-ups on file.   Orders:  Orders Placed This Encounter  Procedures   XR Lumbar Spine 2-3 Views   No orders of the defined types were placed in this encounter.     Procedures: No procedures performed   Clinical Data: No additional findings.   Subjective: Chief Complaint  Patient presents with   Lower Back - Pain    HPI patient here with back pain left leg pain radiates down toward her knee increased with activities.  Previous MRI 2011 2019 showed some progression of disc degeneration facet arthropathy and a labral the level L3-4.  Lateral lumbar radiograph shows a disc in line with the top of the pelvis normally would be considered L4-5.  Patient states she has back pain left leg pain none on the right.  Denies neurogenic claudication symptoms.  It bothers her with some activities particularly  bothers her at night.  Diabetes stage II kidney disease previous knee arthroplasty on the right doing well.  Review of Systems all systems noncontributory HPI.   Objective: Vital Signs: BP (!) 151/95 (BP Location: Right Arm, Patient Position: Sitting)   Pulse 65   Physical Exam Constitutional:      Appearance: She is well-developed.  HENT:     Head: Normocephalic.     Right Ear: External ear normal.     Left Ear: External ear normal. There is no impacted cerumen.  Eyes:     Pupils: Pupils are equal, round, and reactive to light.  Neck:     Thyroid: No thyromegaly.     Trachea: No tracheal deviation.  Cardiovascular:     Rate and Rhythm: Normal rate.  Pulmonary:     Effort: Pulmonary effort is normal.  Abdominal:     Palpations: Abdomen is soft.  Musculoskeletal:     Cervical back: No rigidity.  Skin:    General: Skin is warm and dry.  Neurological:     Mental Status: She is alert and oriented to person, place, and time.  Psychiatric:        Behavior: Behavior normal.     Ortho Exam patient has some pain with straight leg raising positive sciatic notch tenderness positive trochanteric bursal tenderness mild negative logroll hips.  Some pain with straight leg raising 90 degrees.  Specialty  Comments:  No specialty comments available.  Imaging: XR Lumbar Spine 2-3 Views Result Date: 12/21/2023 AP and lateral flexion-extension lumbar radiographs were obtained and reviewed this shows grade 1 anterolisthesis at the L3-4 level as previously numbered by radiology 2019.  She has disc at L5-S1 small and sacralized.  Level of anterolisthesis is in line with the top of the pelvis.  Mild progression from 2019 MRI scan. Impression: Anterolisthesis L3-4 number by radiology with mild progression from 2019.    PMFS History: Patient Active Problem List   Diagnosis Date Noted   Anterolisthesis of lumbar spine 12/21/2023   Arthralgia of hip 02/01/2023   Aortic arch atherosclerosis  ([name]) - mild per CTA 02/20/2022 03/03/2022   Hepatic steatosis 03/03/2022   SVT (supraventricular tachycardia) ([name])    Glaucoma 02/20/2022   Prolonged QT interval 02/20/2022   S/P knee replacement 02/20/2022   S/P total knee arthroplasty, right 02/02/2022   Allergic rhinitis due to pollen 04/03/2021   Lipoma of right upper extremity 05/01/2020   Hyperlipidemia associated with type 2 diabetes mellitus ([name]) 04/30/2020   CKD stage 2 due to type 2 diabetes mellitus ([name]) 04/30/2020   Type 2 diabetes mellitus with stage 2 chronic kidney disease, without long-term current use of insulin ([name]) 04/30/2020   Obesity (BMI 30.0-34.9) 05/30/2018   Osteoporosis 06/01/2017   Medication management 01/29/2015   GERD (gastroesophageal reflux disease)    Vitamin D deficiency    Asthma    Essential hypertension 08/09/2009   Past Medical History:  Diagnosis Date   Acute asthma exacerbation 02/20/2022   Arthritis    "right hand, back" (04/03/2016)   Asthma    takes weekly allergy shots, none in 5-6 weeks due to knee surgery   Bilateral carpal tunnel syndrome    Chronic lower back pain    GERD (gastroesophageal reflux disease)    Hyperlipidemia    Hypertension    Multiple allergies    Pneumonia 03/2012   Positive PPD    "they told me it wasn't positive; I was allergic to the test itself"   Right rotator cuff tear 01/20/2013   Type II diabetes mellitus ([name])    Vasculitis ([name]) 04/03/2016   Vitamin deficiency    Wears dentures    upper   Wears glasses     Family History  Problem Relation Age of Onset   Hypertension Mother    Diabetes Mother    Dementia Mother    Hypertension Father    Alzheimer's disease Father    Tongue cancer Sister        smoker   Cirrhosis Brother    Hypertension Brother    COPD Brother        smoker   Hypertension Brother     Past Surgical History:  Procedure Laterality Date   ABDOMINAL HYSTERECTOMY     BILATERAL SALPINGOOPHORECTOMY  11/28/2010   open  laparoscopy with adhesiolysis also   CARPAL TUNNEL RELEASE  07/15/2012   Procedure: CARPAL TUNNEL RELEASE;  Surgeon: Wyn Forster., MD;  Location: [facility];  Service: Orthopedics;  Laterality: Left;   CARPAL TUNNEL RELEASE Right 05/31/2014   Procedure: RIGHT CARPAL TUNNEL RELEASE;  Surgeon: Wyn Forster, MD;  Location: [facility];  Service: Orthopedics;  Laterality: Right;   COLONOSCOPY     KNEE ARTHROSCOPY Bilateral 2005-2016   "right-left"   SHOULDER ARTHROSCOPY WITH ROTATOR CUFF REPAIR AND SUBACROMIAL DECOMPRESSION Right 01/20/2013   Procedure: RIGHT ARTHROSCOPY SHOULDER DEBRIDEMENT LIMITED, ARTHROSCOPY  SHOULDER DECOMPRESSION SUBACROMIAL PARTIAL ACROMIOPLASTY WITH CORACOACROMIAL RELEASE, ROTATOR CUFF REPAIR ;  Surgeon: Eulas Post, MD;  Location: [facility];  Service: Orthopedics;  Laterality: Right;  RIGHT SHOULDER SCOPE DEBRIDEMENT, ACRIOMIOPLASTY, ROTATOR CUFF REPAIR   SVT ABLATION N/A 08/17/2022   Procedure: SVT ABLATION;  Surgeon: Marinus Maw, MD;  Location: [facility];  Service: Cardiovascular;  Laterality: N/A;   TOTAL KNEE ARTHROPLASTY Right 02/02/2022   Procedure: TOTAL KNEE ARTHROPLASTY;  Surgeon: Ollen Gross, MD;  Location: [facility];  Service: Orthopedics;  Laterality: Right;   TUBAL LIGATION     Social History   Occupational History   Occupation: retired  Tobacco Use   Smoking status: Never   Smokeless tobacco: Never  Vaping Use   Vaping status: Never Used  Substance and Sexual Activity   Alcohol use: No   Drug use: No   Sexual activity: Not Currently    Birth control/protection: Post-menopausal

## 2023-12-23 DIAGNOSIS — J301 Allergic rhinitis due to pollen: Secondary | ICD-10-CM | POA: Diagnosis not present

## 2023-12-23 DIAGNOSIS — J3081 Allergic rhinitis due to animal (cat) (dog) hair and dander: Secondary | ICD-10-CM | POA: Diagnosis not present

## 2023-12-23 DIAGNOSIS — J3089 Other allergic rhinitis: Secondary | ICD-10-CM | POA: Diagnosis not present

## 2023-12-30 DIAGNOSIS — E113412 Type 2 diabetes mellitus with severe nonproliferative diabetic retinopathy with macular edema, left eye: Secondary | ICD-10-CM | POA: Diagnosis not present

## 2023-12-30 DIAGNOSIS — J3089 Other allergic rhinitis: Secondary | ICD-10-CM | POA: Diagnosis not present

## 2023-12-30 DIAGNOSIS — H401131 Primary open-angle glaucoma, bilateral, mild stage: Secondary | ICD-10-CM | POA: Diagnosis not present

## 2023-12-30 DIAGNOSIS — J301 Allergic rhinitis due to pollen: Secondary | ICD-10-CM | POA: Diagnosis not present

## 2023-12-30 DIAGNOSIS — H35352 Cystoid macular degeneration, left eye: Secondary | ICD-10-CM | POA: Diagnosis not present

## 2023-12-30 DIAGNOSIS — J3081 Allergic rhinitis due to animal (cat) (dog) hair and dander: Secondary | ICD-10-CM | POA: Diagnosis not present

## 2023-12-30 DIAGNOSIS — H33322 Round hole, left eye: Secondary | ICD-10-CM | POA: Diagnosis not present

## 2023-12-30 DIAGNOSIS — E113211 Type 2 diabetes mellitus with mild nonproliferative diabetic retinopathy with macular edema, right eye: Secondary | ICD-10-CM | POA: Diagnosis not present

## 2023-12-30 DIAGNOSIS — H43823 Vitreomacular adhesion, bilateral: Secondary | ICD-10-CM | POA: Diagnosis not present

## 2023-12-30 DIAGNOSIS — H33321 Round hole, right eye: Secondary | ICD-10-CM | POA: Diagnosis not present

## 2023-12-31 DIAGNOSIS — Z961 Presence of intraocular lens: Secondary | ICD-10-CM | POA: Diagnosis not present

## 2023-12-31 DIAGNOSIS — H35353 Cystoid macular degeneration, bilateral: Secondary | ICD-10-CM | POA: Diagnosis not present

## 2023-12-31 DIAGNOSIS — H401131 Primary open-angle glaucoma, bilateral, mild stage: Secondary | ICD-10-CM | POA: Diagnosis not present

## 2024-01-06 DIAGNOSIS — J3089 Other allergic rhinitis: Secondary | ICD-10-CM | POA: Diagnosis not present

## 2024-01-06 DIAGNOSIS — J301 Allergic rhinitis due to pollen: Secondary | ICD-10-CM | POA: Diagnosis not present

## 2024-01-06 DIAGNOSIS — J3081 Allergic rhinitis due to animal (cat) (dog) hair and dander: Secondary | ICD-10-CM | POA: Diagnosis not present

## 2024-01-12 ENCOUNTER — Other Ambulatory Visit: Payer: Self-pay

## 2024-01-12 MED ORDER — MOMETASONE FUROATE 50 MCG/ACT NA SUSP: 2.0000 | Freq: Every day | NASAL | 1 refills | Status: AC

## 2024-01-13 DIAGNOSIS — J3089 Other allergic rhinitis: Secondary | ICD-10-CM | POA: Diagnosis not present

## 2024-01-13 DIAGNOSIS — J3081 Allergic rhinitis due to animal (cat) (dog) hair and dander: Secondary | ICD-10-CM | POA: Diagnosis not present

## 2024-01-13 DIAGNOSIS — J301 Allergic rhinitis due to pollen: Secondary | ICD-10-CM | POA: Diagnosis not present

## 2024-01-21 DIAGNOSIS — J3089 Other allergic rhinitis: Secondary | ICD-10-CM | POA: Diagnosis not present

## 2024-01-21 DIAGNOSIS — J3081 Allergic rhinitis due to animal (cat) (dog) hair and dander: Secondary | ICD-10-CM | POA: Diagnosis not present

## 2024-01-21 DIAGNOSIS — J301 Allergic rhinitis due to pollen: Secondary | ICD-10-CM | POA: Diagnosis not present

## 2024-01-25 DIAGNOSIS — E113211 Type 2 diabetes mellitus with mild nonproliferative diabetic retinopathy with macular edema, right eye: Secondary | ICD-10-CM | POA: Diagnosis not present

## 2024-01-25 DIAGNOSIS — H33321 Round hole, right eye: Secondary | ICD-10-CM | POA: Diagnosis not present

## 2024-01-25 DIAGNOSIS — H33322 Round hole, left eye: Secondary | ICD-10-CM | POA: Diagnosis not present

## 2024-01-25 DIAGNOSIS — J3089 Other allergic rhinitis: Secondary | ICD-10-CM | POA: Diagnosis not present

## 2024-01-25 DIAGNOSIS — J301 Allergic rhinitis due to pollen: Secondary | ICD-10-CM | POA: Diagnosis not present

## 2024-01-25 DIAGNOSIS — H43822 Vitreomacular adhesion, left eye: Secondary | ICD-10-CM | POA: Diagnosis not present

## 2024-01-25 DIAGNOSIS — H43821 Vitreomacular adhesion, right eye: Secondary | ICD-10-CM | POA: Diagnosis not present

## 2024-01-25 DIAGNOSIS — J3081 Allergic rhinitis due to animal (cat) (dog) hair and dander: Secondary | ICD-10-CM | POA: Diagnosis not present

## 2024-01-25 DIAGNOSIS — H35352 Cystoid macular degeneration, left eye: Secondary | ICD-10-CM | POA: Diagnosis not present

## 2024-01-25 DIAGNOSIS — E113412 Type 2 diabetes mellitus with severe nonproliferative diabetic retinopathy with macular edema, left eye: Secondary | ICD-10-CM | POA: Diagnosis not present

## 2024-01-25 DIAGNOSIS — H401131 Primary open-angle glaucoma, bilateral, mild stage: Secondary | ICD-10-CM | POA: Diagnosis not present

## 2024-01-27 ENCOUNTER — Other Ambulatory Visit: Payer: Self-pay

## 2024-01-27 DIAGNOSIS — E1122 Type 2 diabetes mellitus with diabetic chronic kidney disease: Secondary | ICD-10-CM

## 2024-01-27 MED ORDER — METFORMIN HCL ER 500 MG PO TB24: ORAL_TABLET | ORAL | 0 refills | Status: DC

## 2024-02-03 DIAGNOSIS — H43822 Vitreomacular adhesion, left eye: Secondary | ICD-10-CM | POA: Diagnosis not present

## 2024-02-03 DIAGNOSIS — H33321 Round hole, right eye: Secondary | ICD-10-CM | POA: Diagnosis not present

## 2024-02-03 DIAGNOSIS — H43821 Vitreomacular adhesion, right eye: Secondary | ICD-10-CM | POA: Diagnosis not present

## 2024-02-03 DIAGNOSIS — H401131 Primary open-angle glaucoma, bilateral, mild stage: Secondary | ICD-10-CM | POA: Diagnosis not present

## 2024-02-03 DIAGNOSIS — E113412 Type 2 diabetes mellitus with severe nonproliferative diabetic retinopathy with macular edema, left eye: Secondary | ICD-10-CM | POA: Diagnosis not present

## 2024-02-03 DIAGNOSIS — H35352 Cystoid macular degeneration, left eye: Secondary | ICD-10-CM | POA: Diagnosis not present

## 2024-02-03 DIAGNOSIS — E113211 Type 2 diabetes mellitus with mild nonproliferative diabetic retinopathy with macular edema, right eye: Secondary | ICD-10-CM | POA: Diagnosis not present

## 2024-02-03 DIAGNOSIS — H33322 Round hole, left eye: Secondary | ICD-10-CM | POA: Diagnosis not present

## 2024-02-03 DIAGNOSIS — J3089 Other allergic rhinitis: Secondary | ICD-10-CM | POA: Diagnosis not present

## 2024-02-03 DIAGNOSIS — J301 Allergic rhinitis due to pollen: Secondary | ICD-10-CM | POA: Diagnosis not present

## 2024-02-03 LAB — HM DIABETES EYE EXAM

## 2024-02-09 DIAGNOSIS — J3089 Other allergic rhinitis: Secondary | ICD-10-CM | POA: Diagnosis not present

## 2024-02-09 DIAGNOSIS — J301 Allergic rhinitis due to pollen: Secondary | ICD-10-CM | POA: Diagnosis not present

## 2024-02-11 DIAGNOSIS — J301 Allergic rhinitis due to pollen: Secondary | ICD-10-CM | POA: Diagnosis not present

## 2024-02-11 DIAGNOSIS — J3081 Allergic rhinitis due to animal (cat) (dog) hair and dander: Secondary | ICD-10-CM | POA: Diagnosis not present

## 2024-02-11 DIAGNOSIS — J3089 Other allergic rhinitis: Secondary | ICD-10-CM | POA: Diagnosis not present

## 2024-02-16 ENCOUNTER — Other Ambulatory Visit: Payer: Self-pay | Admitting: Cardiology

## 2024-02-17 DIAGNOSIS — J3089 Other allergic rhinitis: Secondary | ICD-10-CM | POA: Diagnosis not present

## 2024-02-17 DIAGNOSIS — J3081 Allergic rhinitis due to animal (cat) (dog) hair and dander: Secondary | ICD-10-CM | POA: Diagnosis not present

## 2024-02-17 DIAGNOSIS — J301 Allergic rhinitis due to pollen: Secondary | ICD-10-CM | POA: Diagnosis not present

## 2024-02-24 DIAGNOSIS — H43823 Vitreomacular adhesion, bilateral: Secondary | ICD-10-CM | POA: Diagnosis not present

## 2024-02-24 DIAGNOSIS — E113412 Type 2 diabetes mellitus with severe nonproliferative diabetic retinopathy with macular edema, left eye: Secondary | ICD-10-CM | POA: Diagnosis not present

## 2024-02-24 DIAGNOSIS — H33323 Round hole, bilateral: Secondary | ICD-10-CM | POA: Diagnosis not present

## 2024-02-24 DIAGNOSIS — H35352 Cystoid macular degeneration, left eye: Secondary | ICD-10-CM | POA: Diagnosis not present

## 2024-02-24 DIAGNOSIS — J3081 Allergic rhinitis due to animal (cat) (dog) hair and dander: Secondary | ICD-10-CM | POA: Diagnosis not present

## 2024-02-24 DIAGNOSIS — J3089 Other allergic rhinitis: Secondary | ICD-10-CM | POA: Diagnosis not present

## 2024-02-24 DIAGNOSIS — J301 Allergic rhinitis due to pollen: Secondary | ICD-10-CM | POA: Diagnosis not present

## 2024-02-24 DIAGNOSIS — H401131 Primary open-angle glaucoma, bilateral, mild stage: Secondary | ICD-10-CM | POA: Diagnosis not present

## 2024-02-24 DIAGNOSIS — E113211 Type 2 diabetes mellitus with mild nonproliferative diabetic retinopathy with macular edema, right eye: Secondary | ICD-10-CM | POA: Diagnosis not present

## 2024-02-28 ENCOUNTER — Emergency Department (HOSPITAL_COMMUNITY)

## 2024-02-28 ENCOUNTER — Emergency Department (HOSPITAL_COMMUNITY)
Admission: EM | Admit: 2024-02-28 | Discharge: 2024-02-28 | Disposition: A | Discharge status: Home / Self Care | Attending: Emergency Medicine | Admitting: Emergency Medicine

## 2024-02-28 ENCOUNTER — Ambulatory Visit: Payer: Self-pay

## 2024-02-28 ENCOUNTER — Encounter (HOSPITAL_COMMUNITY): Payer: Self-pay

## 2024-02-28 ENCOUNTER — Other Ambulatory Visit: Payer: Self-pay

## 2024-02-28 DIAGNOSIS — R918 Other nonspecific abnormal finding of lung field: Secondary | ICD-10-CM | POA: Diagnosis not present

## 2024-02-28 DIAGNOSIS — Z7984 Long term (current) use of oral hypoglycemic drugs: Secondary | ICD-10-CM | POA: Diagnosis not present

## 2024-02-28 DIAGNOSIS — Z79899 Other long term (current) drug therapy: Secondary | ICD-10-CM | POA: Insufficient documentation

## 2024-02-28 DIAGNOSIS — R058 Other specified cough: Secondary | ICD-10-CM | POA: Diagnosis not present

## 2024-02-28 DIAGNOSIS — J189 Pneumonia, unspecified organism: Secondary | ICD-10-CM | POA: Diagnosis not present

## 2024-02-28 DIAGNOSIS — R059 Cough, unspecified: Secondary | ICD-10-CM | POA: Diagnosis not present

## 2024-02-28 DIAGNOSIS — E119 Type 2 diabetes mellitus without complications: Secondary | ICD-10-CM | POA: Diagnosis not present

## 2024-02-28 DIAGNOSIS — J45909 Unspecified asthma, uncomplicated: Secondary | ICD-10-CM | POA: Insufficient documentation

## 2024-02-28 DIAGNOSIS — J180 Bronchopneumonia, unspecified organism: Secondary | ICD-10-CM

## 2024-02-28 DIAGNOSIS — I10 Essential (primary) hypertension: Secondary | ICD-10-CM | POA: Insufficient documentation

## 2024-02-28 DIAGNOSIS — J40 Bronchitis, not specified as acute or chronic: Secondary | ICD-10-CM | POA: Diagnosis not present

## 2024-02-28 DIAGNOSIS — R079 Chest pain, unspecified: Secondary | ICD-10-CM | POA: Diagnosis not present

## 2024-02-28 DIAGNOSIS — R0602 Shortness of breath: Secondary | ICD-10-CM | POA: Diagnosis not present

## 2024-02-28 DIAGNOSIS — J984 Other disorders of lung: Secondary | ICD-10-CM | POA: Diagnosis not present

## 2024-02-28 LAB — BASIC METABOLIC PANEL WITH GFR
Anion gap: 11 (ref 5–15)
BUN: 10 mg/dL (ref 8–23)
CO2: 21 mmol/L — ABNORMAL LOW (ref 22–32)
Calcium: 9.5 mg/dL (ref 8.9–10.3)
Chloride: 103 mmol/L (ref 98–111)
Creatinine, Ser: 0.7 mg/dL (ref 0.44–1.00)
GFR, Estimated: >60 mL/min (ref >60)
Glucose, Bld: 135 mg/dL — ABNORMAL HIGH (ref 70–99)
Potassium: 4 mmol/L (ref 3.5–5.1)
Sodium: 135 mmol/L (ref 135–145)

## 2024-02-28 LAB — D-DIMER, QUANTITATIVE: D-Dimer, Quant: 0.89 ug{FEU}/mL — ABNORMAL HIGH (ref 0.00–0.50)

## 2024-02-28 LAB — CBC
HCT: 42.4 % (ref 36.0–46.0)
Hemoglobin: 13.1 g/dL (ref 12.0–15.0)
MCH: 27.2 pg (ref 26.0–34.0)
MCHC: 30.9 g/dL (ref 30.0–36.0)
MCV: 88.1 fL (ref 80.0–100.0)
Platelets: 277 10*3/uL (ref 150–400)
RBC: 4.81 MIL/uL (ref 3.87–5.11)
RDW: 16.2 % — ABNORMAL HIGH (ref 11.5–15.5)
WBC: 8.6 10*3/uL (ref 4.0–10.5)
nRBC: 0 % (ref 0.0–0.2)

## 2024-02-28 LAB — RESP PANEL BY RT-PCR (RSV, FLU A&B, COVID)  RVPGX2
Influenza A by PCR: NEGATIVE
Influenza B by PCR: NEGATIVE
Resp Syncytial Virus by PCR: NEGATIVE
SARS Coronavirus 2 by RT PCR: NEGATIVE

## 2024-02-28 LAB — TROPONIN I (HIGH SENSITIVITY): Troponin I (High Sensitivity): <2 ng/L (ref <18)

## 2024-02-28 MED ORDER — ALBUTEROL SULFATE HFA 108 (90 BASE) MCG/ACT IN AERS
2.0000 | INHALATION_SPRAY | Freq: Once | RESPIRATORY_TRACT | Status: AC
  Administered 2024-02-28: 2 via RESPIRATORY_TRACT
  Filled 2024-02-28: qty 6.7

## 2024-02-28 MED ORDER — METHYLPREDNISOLONE SODIUM SUCC 125 MG IJ SOLR
125.0000 mg | Freq: Once | INTRAMUSCULAR | Status: AC
Start: 2024-02-28 — End: 2024-02-28
  Administered 2024-02-28: 125 mg via INTRAVENOUS
  Filled 2024-02-28: qty 2

## 2024-02-28 MED ORDER — DOXYCYCLINE HYCLATE 100 MG PO TABS
100.0000 mg | ORAL_TABLET | Freq: Once | ORAL | Status: AC
Start: 2024-02-28 — End: 2024-02-28
  Administered 2024-02-28: 100 mg via ORAL
  Filled 2024-02-28: qty 1

## 2024-02-28 MED ORDER — SODIUM CHLORIDE 0.9 % IV SOLN
1.0000 g | Freq: Once | INTRAVENOUS | Status: AC
  Administered 2024-02-28: 1 g via INTRAVENOUS
  Filled 2024-02-28: qty 10

## 2024-02-28 MED ORDER — DOXYCYCLINE HYCLATE 100 MG PO CAPS: 100.0000 mg | ORAL_CAPSULE | Freq: Two times a day (BID) | ORAL | 0 refills | Status: DC

## 2024-02-28 MED ORDER — KETOROLAC TROMETHAMINE 30 MG/ML IJ SOLN
30.0000 mg | Freq: Once | INTRAMUSCULAR | Status: AC
  Administered 2024-02-28: 30 mg via INTRAVENOUS
  Filled 2024-02-28: qty 1

## 2024-02-28 MED ORDER — DOXYCYCLINE HYCLATE 100 MG PO CAPS: 100.0000 mg | ORAL_CAPSULE | Freq: Two times a day (BID) | ORAL | 0 refills | Status: AC

## 2024-02-28 MED ORDER — AEROCHAMBER Z-STAT PLUS/MEDIUM MISC
1.0000 | Freq: Once | Status: AC
  Administered 2024-02-28: 1
  Filled 2024-02-28: qty 1

## 2024-02-28 MED ORDER — ALBUTEROL SULFATE HFA 108 (90 BASE) MCG/ACT IN AERS: 1.0000 | INHALATION_SPRAY | RESPIRATORY_TRACT | Status: DC | PRN

## 2024-02-28 MED ORDER — IPRATROPIUM-ALBUTEROL 0.5-2.5 (3) MG/3ML IN SOLN
3.0000 mL | Freq: Once | RESPIRATORY_TRACT | Status: AC
  Administered 2024-02-28: 3 mL via RESPIRATORY_TRACT
  Filled 2024-02-28: qty 3

## 2024-02-28 MED ORDER — IOHEXOL 350 MG/ML SOLN
75.0000 mL | Freq: Once | INTRAVENOUS | Status: AC | PRN
  Administered 2024-02-28: 75 mL via INTRAVENOUS

## 2024-02-28 MED ORDER — PREDNISONE 10 MG PO TABS: 40.0000 mg | ORAL_TABLET | Freq: Every day | ORAL | 0 refills | Status: AC

## 2024-02-28 NOTE — ED Triage Notes (Addendum)
 Patient stated since last night she has been short of breath along with left sided chest pain. No radiation. No nausea or vomiting. Coughing up yellow mucus. Shortness of breath goes away when she sits. Has a headache. Has been using her inhaler and nose spray. Stated her allergies are very bad.

## 2024-02-28 NOTE — Telephone Encounter (Signed)
  Copied From CRM [id number]. Reason for Triage: patient has new patient appointment coming up in May with Dr. Durene Cal but currently she thinks she may have an upper respiratory infection and wants to know what she can do to help that or if she can be seen sooner.     Chief Complaint: cough with yellow sputum Symptoms: cough, chest pain, tightness, runny nose, hoarse voice, hx asthma Frequency: got worse yesterday Pertinent Negatives: Patient denies NA Disposition: [x] ED /[] Urgent Care (no appt availability in office) / [] Appointment(In office/virtual)/ []  [facility]/ [] Home Care/ [] Refused Recommended Disposition /[] [facility]/ []  Follow-up with PCP Additional Notes: per protocol instructed to go to ER.    Reason for Disposition  [1] MODERATE difficulty breathing (e.g., speaks in phrases, SOB even at rest, pulse 100-120) AND [2] still present when not coughing  Answer Assessment - Initial Assessment Questions 1. ONSET: "When did the cough begin?"      Got worse yesterday 2. SEVERITY: "How bad is the cough today?"      Moderate  3. SPUTUM: "Describe the color of your sputum" (none, dry cough; clear, white, yellow, green)     yellow 4. HEMOPTYSIS: "Are you coughing up any blood?" If so ask: "How much?" (flecks, streaks, tablespoons, etc.)     denies 5. DIFFICULTY BREATHING: "Are you having difficulty breathing?" If Yes, ask: "How bad is it?" (e.g., mild, moderate, severe)    - MILD: No SOB at rest, mild SOB with walking, speaks normally in sentences, can lie down, no retractions, pulse < 100.    - MODERATE: SOB at rest, SOB with minimal exertion and prefers to sit, cannot lie down flat, speaks in phrases, mild retractions, audible wheezing, pulse 100-120.    - SEVERE: Very SOB at rest, speaks in single words, struggling to breathe, sitting hunched forward, retractions, pulse > 120      Moderate; hx asthma 6. FEVER: "Do you have a fever?" If Yes, ask: "What is your  temperature, how was it measured, and when did it start?"     denies 7. CARDIAC HISTORY: "Do you have any history of heart disease?" (e.g., heart attack, congestive heart failure)      Yes, heart rate goes too high 8. LUNG HISTORY: "Do you have any history of lung disease?"  (e.g., pulmonary embolus, asthma, emphysema)     asthma 9. PE RISK FACTORS: "Do you have a history of blood clots?" (or: recent major surgery, recent prolonged travel, bedridden)     no 10. OTHER SYMPTOMS: "Do you have any other symptoms?" (e.g., runny nose, wheezing, chest pain)       Chest is sore; wheezing and runny nose 11. PREGNANCY: "Is there any chance you are pregnant?" "When was your last menstrual period?"       no 12. TRAVEL: "Have you traveled out of the country in the last month?" (e.g., travel history, exposures)       Denies.  Protocols used: Cough - Acute Productive-A-AH

## 2024-02-28 NOTE — Discharge Instructions (Addendum)
 Your CT scan showed that you may have bronchopneumonia, which is an infection of the lungs.  This can be caused by viruses or bacteria.  We did decide to treat you with an antibiotic for possible bacterial infection.  You were given these antibiotics in the ER as well as steroids.  You will need to take the antibiotics for the next week as prescribed, beginning tomorrow

## 2024-02-28 NOTE — ED Provider Notes (Signed)
 [place] [facility]ENC[facility] DEPART[facility] AT [facility] Provider Note   CSN: [id number] Arrival date & time: 02/28/24  1240     History  Chief Complaint  Patient presents with   Shortness of Breath    Lauren Lopez is a 73 y.o. female.  Pt is a 73 yo female with pmhx significant for seasonal allergies, GERD, HTN, HLD, DM2, chronic back pain, arthritis, and asthma.  Pt said she's had sinus problems, sob, and some left sided cp.  No fevers.  Husband had similar about a week ago.  Pt said her sx started on 3/27.         Home Medications Prior to Admission medications   Medication Sig Start Date End Date Taking? Authorizing Provider  doxycycline (VIBRAMYCIN) 100 MG capsule Take 1 capsule (100 mg total) by mouth 2 (two) times daily. 02/28/24  Yes Jacalyn Lefevre, MD  [facility] [phone] MCG/ACT inhaler Use  2 Inhalations  2 x /day  15 minutes apart   2 x /day (every 12 hours) 10/23/22   Lucky Cowboy, MD  blood glucose meter kit and supplies Test sugars twice daily. Dx E11.9 01/24/19   Doree Albee, PA-C  Cholecalciferol (VITAMIN D3) 250 MCG (10000 UT) capsule Take 10,000 Units by mouth daily.    [provider]  diltiazem (CARDIZEM CD) 300 MG 24 hr capsule TAKE 1 CAPSULE BY MOUTH EVERY DAY 02/17/24   Little Ishikawa, MD  docusate sodium (COLACE) 100 MG capsule Take 100 mg by mouth daily.    [provider]  EPINEPHrine 0.3 mg/0.3 mL IJ SOAJ injection Inject 0.3 mg into the muscle as needed for anaphylaxis. 12/02/21   [provider]  fexofenadine (ALLEGRA) 180 MG tablet Take 1 tablet (180 mg total) by mouth daily. 10/17/15   Shirleen Schirmer, PA-C  guaiFENesin (MUCINEX PO) Take by mouth as needed.    [provider]  ketorolac (ACULAR) 0.5 % ophthalmic solution Place 1 drop into both eyes 4 (four) times daily. 04/17/23   [provider]  Lancets (ONETOUCH DELICA PLUS [place]) MISC TEST SUGAR TWICE A DAY 07/25/19   Lucky Cowboy, MD  Magnesium 250 MG TABS Take 250 mg by mouth in the morning and at bedtime.    [provider]  metFORMIN (GLUCOPHAGE-XR) 500 MG 24 hr tablet Take 1 tablet  2 x /day  with Meals for Diabetes. 01/27/24   Eulis Foster, FNP  Misc Natural Products (NEURIVA PO) Take 1 tablet by mouth daily.    [provider]  mometasone (NASONEX) 50 MCG/ACT nasal spray Place 2 sprays into the nose daily. 01/12/24   Eulis Foster, FNP  nitroGLYCERIN (NITROSTAT) 0.4 MG SL tablet Place 1 tablet (0.4 mg total) under the tongue every 5 (five) minutes as needed for chest pain. 11/13/22 08/23/23  Gaston Islam., NP  omeprazole (PRILOSEC) 40 MG capsule TAKE ONE CAPSULE DAILY FOR INDIGESTION AND HEARTBURN 08/18/23   Raynelle Dick, NP  [facility] ULTRA test strip USE TO TEST SUGAR TWICE A DAY 07/17/22   Raynelle Dick, NP  Rhubarb (ESTROVEN MENOPAUSE RELIEF PO) Take 1 capsule by mouth daily.    [provider]  rosuvastatin (CRESTOR) 20 MG tablet TAKE 1 TABLET BY MOUTH EVERY DAY FOR CHOLESTEROL 08/18/23   Raynelle Dick, NP  tirzepatide [facility]) 2.5 MG/0.5ML Pen Inject 1 pen (2.5 mg)   into  Skin every 7 days for Diabetes     ( e11.9 ) 12/13/23  Lucky Cowboy, MD      Allergies    Metronidazole and Ppd [tuberculin purified protein derivative]    Review of Systems   Review of Systems  HENT:  Positive for postnasal drip and sinus pressure.   Respiratory:  Positive for shortness of breath.   Cardiovascular:  Positive for chest pain.  All other systems reviewed and are negative.   Physical Exam Updated Vital Signs BP (!) 149/73 (BP Location: Right Arm)   Pulse 90   Temp 97.8 F (36.6 C) (Oral)   Resp 20   Ht 5\' 3"  (1.6 m)   Wt 90.7 kg   SpO2 100%   BMI 35.43 kg/m  Physical Exam Vitals and nursing note reviewed.  Constitutional:      Appearance: She is well-developed.  HENT:     Head: Normocephalic and atraumatic.     Mouth/Throat:     Mouth: Mucous  membranes are moist.     Pharynx: Oropharynx is clear.  Eyes:     Extraocular Movements: Extraocular movements intact.     Pupils: Pupils are equal, round, and reactive to light.  Cardiovascular:     Rate and Rhythm: Regular rhythm. Tachycardia present.  Pulmonary:     Effort: Pulmonary effort is normal.     Breath sounds: Wheezing present.  Abdominal:     General: Bowel sounds are normal.     Palpations: Abdomen is soft.  Musculoskeletal:        General: Normal range of motion.     Cervical back: Normal range of motion and neck supple.  Skin:    General: Skin is warm.     Capillary Refill: Capillary refill takes less than 2 seconds.  Neurological:     General: No focal deficit present.     Mental Status: She is alert and oriented to person, place, and time.  Psychiatric:        Mood and Affect: Mood normal.        Behavior: Behavior normal.     ED Results / Procedures / Treatments   Labs (all labs ordered are listed, but only abnormal results are displayed) Labs Reviewed  BASIC METABOLIC PANEL WITH GFR - Abnormal; Notable for the following components:      Result Value   CO2 21 (*)    Glucose, Bld 135 (*)    All other components within normal limits  CBC - Abnormal; Notable for the following components:   RDW 16.2 (*)    All other components within normal limits  D-DIMER, QUANTITATIVE - Abnormal; Notable for the following components:   D-Dimer, Quant 0.89 (*)    All other components within normal limits  RESP PANEL BY RT-PCR (RSV, FLU A&B, COVID)  [id number]  TROPONIN I (HIGH SENSITIVITY)  TROPONIN I (HIGH SENSITIVITY)    EKG EKG Interpretation Date/Time:  Monday February 28 2024 12:44:55 EDT Ventricular Rate:  115 PR Interval:  146 QRS Duration:  81 QT Interval:  330 QTC Calculation: 457 R Axis:   28  Text Interpretation: Sinus tachycardia Ventricular premature complex Probable left atrial enlargement Nonspecific T abnormalities, lateral leads Since last tracing  rate faster Confirmed by Jacalyn Lefevre [phone]) on 02/28/2024 1:19:39 PM  Radiology DG Chest 2 View Result Date: 02/28/2024 CLINICAL DATA:  Shortness of breath EXAM: CHEST - 2 VIEW COMPARISON:  02/20/2022 FINDINGS: No consolidation, pneumothorax or effusion. No edema. Normal cardiopericardial silhouette. Overlapping gown snaps. IMPRESSION: No acute cardiopulmonary disease. Electronically Signed   By: Piedad Climes.D.  On: 02/28/2024 15:28    Procedures Procedures    Medications Ordered in ED Medications  albuterol (VENTOLIN HFA) 108 (90 Base) MCG/ACT inhaler 1-2 puff (has no administration in time range)  aerochamber Z-Stat Plus/medium 1 each (has no administration in time range)  ipratropium-albuterol (DUONEB) 0.5-2.5 (3) MG/3ML nebulizer solution 3 mL (3 mLs Nebulization Given 02/28/24 1354)  methylPREDNISolone sodium succinate (SOLU-MEDROL) 125 mg/2 mL injection 125 mg (125 mg Intravenous Given 02/28/24 1401)  ketorolac (TORADOL) 30 MG/ML injection 30 mg (30 mg Intravenous Given 02/28/24 1405)    ED Course/ Medical Decision Making/ A&P                                 Medical Decision Making Amount and/or Complexity of Data Reviewed Labs: ordered. Radiology: ordered.  Risk Prescription drug management.   This patient presents to the ED for concern of sob, cp, this involves an extensive number of treatment options, and is a complaint that carries with it a high risk of complications and morbidity.  The differential diagnosis includes covid/flu/rsv, pna, sinusitis, bronchitis, PE   Co morbidities that complicate the patient evaluation  seasonal allergies, GERD, HTN, HLD, DM2, chronic back pain, arthritis, and asthma   Additional history obtained:  Additional history obtained from epic chart review External records from outside source obtained and reviewed including family   Lab Tests:  I Ordered, and personally interpreted labs.  The pertinent results include:  cbc nl,  bmp nl, ddimer + at 0.89, trop nl, covid/flu/rsv neg   Imaging Studies ordered:  I ordered imaging studies including cxr and ct chest I independently visualized and interpreted imaging which showed  CXR: No acute cardiopulmonary disease.  CT chest pending at shift change. I agree with the radiologist interpretation   Cardiac Monitoring:  The patient was maintained on a cardiac monitor.  I personally viewed and interpreted the cardiac monitored which showed an underlying rhythm of: st   Medicines ordered and prescription drug management:  I ordered medication including toradol/solumedrol/duoneb  for sx  Reevaluation of the patient after these medicines showed that the patient improved I have reviewed the patients home medicines and have made adjustments as needed   Test Considered:  ct   Problem List / ED Course:  Sob and cough:  likely bronchitis, but DDimer is +, so I ordered ct chest which is pending.   Reevaluation:  After the interventions noted above, I reevaluated the patient and found that they have :improved   Social Determinants of Health:  Lives at home   Dispostion:  Pending at shift change        Final Clinical Impression(s) / ED Diagnoses Final diagnoses:  Bronchitis    Rx / DC Orders ED Discharge Orders          Ordered    doxycycline (VIBRAMYCIN) 100 MG capsule  2 times daily        02/28/24 1635              Jacalyn Lefevre, MD 02/28/24 1642

## 2024-02-28 NOTE — ED Provider Notes (Signed)
 73 yo female here with cough, congestion, small amount hemoptysis Positive Ddimer Pending CT PE  Physical Exam  BP (!) 149/73 (BP Location: Right Arm)   Pulse 90   Temp 97.8 F (36.6 C) (Oral)   Resp 20   Ht 5\' 3"  (1.6 m)   Wt 90.7 kg   SpO2 100%   BMI 35.43 kg/m   Physical Exam  Procedures  Procedures  ED Course / MDM    Medical Decision Making Amount and/or Complexity of Data Reviewed Labs: ordered. Radiology: ordered.  Risk Prescription drug management.   Patient reassessed.  She is feeling better with her breathing treatment.  We discussed CT findings.  This may be viral bronchopneumonia, but given her age and comorbidities, I think it is also reasonable and safe to treat her with antibiotics for potential bacterial infection.  She was given Rocephin and doxycycline here and will be prescribed this for home, as well as 4 more days of steroids.  She was given an inhaler with a spacer.  She is comfortable going home with her husband.       Terald Sleeper, MD 02/28/24 [phone]

## 2024-03-01 ENCOUNTER — Ambulatory Visit: Payer: Medicare HMO | Admitting: Nurse Practitioner

## 2024-03-16 ENCOUNTER — Ambulatory Visit: Payer: Medicare HMO | Admitting: Nurse Practitioner

## 2024-03-16 DIAGNOSIS — H43822 Vitreomacular adhesion, left eye: Secondary | ICD-10-CM | POA: Diagnosis not present

## 2024-03-16 DIAGNOSIS — H33322 Round hole, left eye: Secondary | ICD-10-CM | POA: Diagnosis not present

## 2024-03-16 DIAGNOSIS — H401131 Primary open-angle glaucoma, bilateral, mild stage: Secondary | ICD-10-CM | POA: Diagnosis not present

## 2024-03-16 DIAGNOSIS — H35352 Cystoid macular degeneration, left eye: Secondary | ICD-10-CM | POA: Diagnosis not present

## 2024-03-16 DIAGNOSIS — E113211 Type 2 diabetes mellitus with mild nonproliferative diabetic retinopathy with macular edema, right eye: Secondary | ICD-10-CM | POA: Diagnosis not present

## 2024-03-16 DIAGNOSIS — H33321 Round hole, right eye: Secondary | ICD-10-CM | POA: Diagnosis not present

## 2024-03-16 DIAGNOSIS — H43821 Vitreomacular adhesion, right eye: Secondary | ICD-10-CM | POA: Diagnosis not present

## 2024-03-16 DIAGNOSIS — E113412 Type 2 diabetes mellitus with severe nonproliferative diabetic retinopathy with macular edema, left eye: Secondary | ICD-10-CM | POA: Diagnosis not present

## 2024-03-27 DIAGNOSIS — H401131 Primary open-angle glaucoma, bilateral, mild stage: Secondary | ICD-10-CM | POA: Diagnosis not present

## 2024-03-27 DIAGNOSIS — E113211 Type 2 diabetes mellitus with mild nonproliferative diabetic retinopathy with macular edema, right eye: Secondary | ICD-10-CM | POA: Diagnosis not present

## 2024-03-27 DIAGNOSIS — H43822 Vitreomacular adhesion, left eye: Secondary | ICD-10-CM | POA: Diagnosis not present

## 2024-03-27 DIAGNOSIS — H33321 Round hole, right eye: Secondary | ICD-10-CM | POA: Diagnosis not present

## 2024-03-27 DIAGNOSIS — E113412 Type 2 diabetes mellitus with severe nonproliferative diabetic retinopathy with macular edema, left eye: Secondary | ICD-10-CM | POA: Diagnosis not present

## 2024-03-27 DIAGNOSIS — H35352 Cystoid macular degeneration, left eye: Secondary | ICD-10-CM | POA: Diagnosis not present

## 2024-03-27 DIAGNOSIS — H43821 Vitreomacular adhesion, right eye: Secondary | ICD-10-CM | POA: Diagnosis not present

## 2024-03-27 DIAGNOSIS — H33322 Round hole, left eye: Secondary | ICD-10-CM | POA: Diagnosis not present

## 2024-04-11 DIAGNOSIS — J3089 Other allergic rhinitis: Secondary | ICD-10-CM | POA: Diagnosis not present

## 2024-04-11 DIAGNOSIS — J301 Allergic rhinitis due to pollen: Secondary | ICD-10-CM | POA: Diagnosis not present

## 2024-04-11 DIAGNOSIS — J3081 Allergic rhinitis due to animal (cat) (dog) hair and dander: Secondary | ICD-10-CM | POA: Diagnosis not present

## 2024-04-18 ENCOUNTER — Ambulatory Visit (INDEPENDENT_AMBULATORY_CARE_PROVIDER_SITE_OTHER): Admitting: Family Medicine

## 2024-04-18 ENCOUNTER — Encounter: Payer: Self-pay | Admitting: Family Medicine

## 2024-04-18 VITALS — BP 112/72 | HR 69 | Temp 97.3°F | Ht 63.0 in | Wt 200.4 lb

## 2024-04-18 DIAGNOSIS — J3081 Allergic rhinitis due to animal (cat) (dog) hair and dander: Secondary | ICD-10-CM | POA: Diagnosis not present

## 2024-04-18 DIAGNOSIS — Z7984 Long term (current) use of oral hypoglycemic drugs: Secondary | ICD-10-CM | POA: Diagnosis not present

## 2024-04-18 DIAGNOSIS — J301 Allergic rhinitis due to pollen: Secondary | ICD-10-CM | POA: Diagnosis not present

## 2024-04-18 DIAGNOSIS — E1169 Type 2 diabetes mellitus with other specified complication: Secondary | ICD-10-CM | POA: Diagnosis not present

## 2024-04-18 DIAGNOSIS — E1122 Type 2 diabetes mellitus with diabetic chronic kidney disease: Secondary | ICD-10-CM

## 2024-04-18 DIAGNOSIS — J3089 Other allergic rhinitis: Secondary | ICD-10-CM | POA: Diagnosis not present

## 2024-04-18 DIAGNOSIS — Z1211 Encounter for screening for malignant neoplasm of colon: Secondary | ICD-10-CM

## 2024-04-18 DIAGNOSIS — N182 Chronic kidney disease, stage 2 (mild): Secondary | ICD-10-CM

## 2024-04-18 DIAGNOSIS — E785 Hyperlipidemia, unspecified: Secondary | ICD-10-CM

## 2024-04-18 DIAGNOSIS — I10 Essential (primary) hypertension: Secondary | ICD-10-CM | POA: Diagnosis not present

## 2024-04-18 DIAGNOSIS — E11319 Type 2 diabetes mellitus with unspecified diabetic retinopathy without macular edema: Secondary | ICD-10-CM | POA: Insufficient documentation

## 2024-04-18 LAB — COMPREHENSIVE METABOLIC PANEL WITH GFR
ALT: 19 U/L (ref 0–35)
AST: 22 U/L (ref 0–37)
Albumin: 4.5 g/dL (ref 3.5–5.2)
Alkaline Phosphatase: 102 U/L (ref 39–117)
BUN: 14 mg/dL (ref 6–23)
CO2: 25 meq/L (ref 19–32)
Calcium: 9.7 mg/dL (ref 8.4–10.5)
Chloride: 104 meq/L (ref 96–112)
Creatinine, Ser: 0.8 mg/dL (ref 0.40–1.20)
GFR: 73.57 mL/min (ref >60.00)
Glucose, Bld: 128 mg/dL — ABNORMAL HIGH (ref 70–99)
Potassium: 4.3 meq/L (ref 3.5–5.1)
Sodium: 140 meq/L (ref 135–145)
Total Bilirubin: 0.6 mg/dL (ref 0.2–1.2)
Total Protein: 7.3 g/dL (ref 6.0–8.3)

## 2024-04-18 LAB — MAGNESIUM: Magnesium: 1.9 mg/dL (ref 1.5–2.5)

## 2024-04-18 LAB — HEMOGLOBIN A1C: Hgb A1c MFr Bld: 7 % — ABNORMAL HIGH (ref 4.6–6.5)

## 2024-04-18 MED ORDER — ONETOUCH ULTRA VI STRP: ORAL_STRIP | 3 refills | Status: DC

## 2024-04-18 NOTE — Patient Instructions (Addendum)
 We have placed a referral for you today to gastroenterology for colonoscopy update- please call their # if you do not hear within a week (may be listed below or you may see mychart message within a few days with #).   Sned us  the dates of the shingrix shots  Sent test strips in  I may retrial Mounjaro if a1c high but at [facility] [place]   Please stop by lab before you go If you have mychart- we will send your results within 3 business days of us  receiving them.  If you do not have mychart- we will call you about results within 5 business days of us  receiving them.  *please also note that you will see labs on mychart as soon as they post. I will later go in and write notes on them- will say "notes from Dr. Arlene Ben"    Recommended follow up: Return in about 14 weeks (around 07/25/2024) for followup or sooner if needed.Schedule b4 you leave.

## 2024-04-18 NOTE — Progress Notes (Signed)
 Phone: [phone]   Subjective:  Patient presents today to establish care.  Prior patient of Dr. Maeola Schmidt .  Chief Complaint  Patient presents with   Diabetes   See problem oriented charting  The following were reviewed and entered/updated in epic: Past Medical History:  Diagnosis Date   Acute asthma exacerbation 02/20/2022   Arthritis    "right hand, back" (04/03/2016)   Asthma    takes weekly allergy  shots, none in 5-6 weeks due to knee surgery   Bilateral carpal tunnel syndrome    Chronic lower back pain    GERD (gastroesophageal reflux disease)    Hyperlipidemia    Hypertension    Multiple allergies    Pneumonia 03/2012   Positive PPD    "they told me it wasn't positive; I was allergic to the test itself"   Right rotator cuff tear 01/20/2013   Type II diabetes mellitus ([name])    Vasculitis ([name]) 04/03/2016   Vitamin deficiency    Wears dentures    upper   Wears glasses    Patient Active Problem List   Diagnosis Date Noted   Prolonged QT interval 02/20/2022    Priority: High   Type 2 diabetes mellitus with stage 2 chronic kidney disease, without long-term current use of insulin  ([name]) 04/30/2020    Priority: High   Diabetic retinopathy ([name]) 04/18/2024    Priority: Medium    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 02/20/2022 03/03/2022    Priority: Medium    Hepatic steatosis 03/03/2022    Priority: Medium    SVT (supraventricular tachycardia) ([name])     Priority: Medium    Allergic rhinitis due to pollen 04/03/2021    Priority: Medium    Hyperlipidemia associated with type 2 diabetes mellitus ([name]) 04/30/2020    Priority: Medium    Osteoporosis 06/01/2017    Priority: Medium    GERD (gastroesophageal reflux disease)     Priority: Medium    Vitamin D  deficiency     Priority: Medium    Asthma     Priority: Medium    Essential hypertension 08/09/2009    Priority: Medium    Lipoma of right upper extremity 05/01/2020    Priority: Low    Anterolisthesis of lumbar spine 12/21/2023    Priority: 1.   Arthralgia of hip 02/01/2023    Priority: 1.   S/P knee replacement 02/20/2022    Priority: 1.   S/P total knee arthroplasty, right 02/02/2022    Priority: 1.   Past Surgical History:  Procedure Laterality Date   ABDOMINAL HYSTERECTOMY     BILATERAL SALPINGOOPHORECTOMY  11/28/2010   open laparoscopy with adhesiolysis also   CARPAL TUNNEL RELEASE  07/15/2012   Procedure: CARPAL TUNNEL RELEASE;  Surgeon: Amelie Baize., MD;  Location: [facility];  Service: Orthopedics;  Laterality: Left;   CARPAL TUNNEL RELEASE Right 05/31/2014   Procedure: RIGHT CARPAL TUNNEL RELEASE;  Surgeon: Amelie Baize, MD;  Location: [facility];  Service: Orthopedics;  Laterality: Right;   COLONOSCOPY     KNEE ARTHROSCOPY Bilateral 2005-2016   "right-left"   SHOULDER ARTHROSCOPY WITH ROTATOR CUFF REPAIR AND SUBACROMIAL DECOMPRESSION Right 01/20/2013   Procedure: RIGHT ARTHROSCOPY SHOULDER DEBRIDEMENT LIMITED, ARTHROSCOPY SHOULDER DECOMPRESSION SUBACROMIAL PARTIAL ACROMIOPLASTY WITH CORACOACROMIAL RELEASE, ROTATOR CUFF REPAIR ;  Surgeon: Neville Barbone, MD;  Location: [facility];  Service: Orthopedics;  Laterality: Right;  RIGHT SHOULDER SCOPE DEBRIDEMENT, ACRIOMIOPLASTY, ROTATOR CUFF REPAIR   SVT  ABLATION N/A 08/17/2022   Procedure: SVT ABLATION;  Surgeon: Tammie Fall, MD;  Location: [facility];  Service: Cardiovascular;  Laterality: N/A;   TOTAL KNEE ARTHROPLASTY Right 02/02/2022   Procedure: TOTAL KNEE ARTHROPLASTY;  Surgeon: Liliane Rei, MD;  Location: [facility];  Service: Orthopedics;  Laterality: Right;   TUBAL LIGATION      Family History  Problem Relation Age of Onset   Hypertension Mother    Dementia Mother    Hyperlipidemia Mother    Hypertension Father    Alzheimer's disease Father    Hyperlipidemia Father    Tongue cancer Sister        smoker   Cirrhosis Brother     Hypertension Brother    COPD Brother        smoker   Hypertension Brother    High blood pressure Brother     Medications- reviewed and updated Current Outpatient Medications  Medication Sig Dispense Refill   ADVAIR  HFA 230-21 MCG/ACT inhaler Use  2 Inhalations  2 x /day  15 minutes apart   2 x /day (every 12 hours) 12 each 2   blood glucose meter kit and supplies Test sugars twice daily. Dx E11.9 1 each 0   Cholecalciferol  (VITAMIN D3) 250 MCG (10000 UT) capsule Take 10,000 Units by mouth daily.     diltiazem  (CARDIZEM  CD) 300 MG 24 hr capsule TAKE 1 CAPSULE BY MOUTH EVERY DAY 90 capsule 2   docusate sodium  (COLACE) 100 MG capsule Take 100 mg by mouth daily.     EPINEPHrine  0.3 mg/0.3 mL IJ SOAJ injection Inject 0.3 mg into the muscle as needed for anaphylaxis.     fexofenadine  (ALLEGRA ) 180 MG tablet Take 1 tablet (180 mg total) by mouth daily. 90 tablet 6   guaiFENesin  (MUCINEX  PO) Take by mouth as needed.     ketorolac  (ACULAR ) 0.5 % ophthalmic solution Place 1 drop into both eyes 4 (four) times daily.     Lancets (ONETOUCH DELICA PLUS LANCET33G) MISC TEST SUGAR TWICE A DAY 200 each 1   Magnesium  250 MG TABS Take 250 mg by mouth in the morning and at bedtime.     metFORMIN  (GLUCOPHAGE -XR) 500 MG 24 hr tablet Take 1 tablet  2 x /day  with Meals for Diabetes. 180 tablet 0   Misc Natural Products (NEURIVA PO) Take 1 tablet by mouth daily.     mometasone  (NASONEX ) 50 MCG/ACT nasal spray Place 2 sprays into the nose daily. 51 each 1   omeprazole  (PRILOSEC) 40 MG capsule TAKE ONE CAPSULE DAILY FOR INDIGESTION AND HEARTBURN 90 capsule 3   Rhubarb (ESTROVEN MENOPAUSE RELIEF PO) Take 1 capsule by mouth daily.     rosuvastatin  (CRESTOR ) 20 MG tablet TAKE 1 TABLET BY MOUTH EVERY DAY FOR CHOLESTEROL 90 tablet 3   glucose blood (ONETOUCH ULTRA) test strip USE TO TEST SUGAR TWICE A DAY 100 strip 3   nitroGLYCERIN  (NITROSTAT ) 0.4 MG SL tablet Place 1 tablet (0.4 mg total) under the tongue every 5  (five) minutes as needed for chest pain. 25 tablet 3   No current facility-administered medications for this visit.    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 Oropharynx normal. TM normal. Eyes: sclera and lids normal, PERRLA Neck: noted thyromegaly, no cervical lymphadenopathy CV: RRR no murmurs rubs or gallops Lungs: CTAB no crackles, wheeze, rhonchi Abdomen: soft/nontender/nondistended/normal bowel sounds. No rebound or guarding.  Ext: no edema Skin: warm, dry Neuro: 5/5 strength in upper and lower extremities, normal gait, normal reflexes     Assessment and Plan:   #stress- husband reestablished relationship with a long lost daughter- he had previously denied and they tested and test was negative for relationship- estranged 54 years previously - but still building relationship -a lot of stress with this- more than happy to refer her for psychosocial support if she desires- declines today  # Diabetes S: Medication:Metformin  500 mg twice a day extended release- loose stools on 3x a day in past -Prior PCP encouraged Mounjaro but was $250 still  CBGs- usually 130s in the morning- but lately jumped up as high as 270s just once-  more 140's or 150s she thinks stress related as around easter had cut out carbohydrates heavily but still running high. Needs to update strips Exercise and diet- trying to stay in the gym to help her sugars- lifts weights and walks Lab Results  Component Value Date   HGBA1C 6.6 (H) 12/13/2023   HGBA1C 6.8 (H) 08/23/2023   HGBA1C 6.8 (H) 05/04/2023   A/P: hopefully stable- update a1c today but concerned could be higher. Continue current meds for now - I'm worried #s are elevated at home and we may need to adjust medicine- I may retiral Mounjaro but [facility] -sees Dr. Seward Dao for diabetes retinopathy and injections ever 4 weeks   #hypertension with history of SVT and has seen Dr. Alda Amas  with ablation by Dr. Carolynne Citron 07/2022 S: medication: Diltiazem  300 mg daily extended release A/P: well controlled continue current medications   #hyperlipidemia with aortic arch atherosclerosis  S: Medication:Rosuvastatin  20 mg daily Lab Results  Component Value Date   CHOL 165 12/13/2023   HDL 86 12/13/2023   LDLCALC 62 12/13/2023   TRIG 83 12/13/2023   CHOLHDL 1.9 12/13/2023  A/P: #s excellent on last check and had reassuring CT coronary morphology with CT calium of 0     # Asthma/allergies S: Maintenance Medication: Advair  230-21 mcg 2 puffs twice daily -Also uses Allegra  and Nasonex  for allergies-needs Mucinex  at times As needed medication: albuterol . Patient is using this almost never.  -Exacerbation with ED trip 3/31/2025with CTA without pulmonary embolism but showing bronchopneumonia but treated outpatient A/P: asthma and allergies reasonably controlled- continue current medications    # GERD S:Medication: Omeprazole  40 mg -B12 normal in 2023  A/P: stable- continue current medicines    #Vitamin D  deficiency S: Medication: Prior PCP recommended 10,000 units a day Last vitamin D  Lab Results  Component Value Date   VD25OH 38 12/13/2023  A/P: hopefully stable at  least- recheck yearly  #  hypomagnesemia- on twice a day and helps with restless legs - will recheck today   # Fatty liver-monitor LFTs at least annually and focus on healthy eating/regular exercise   #Low back pain- has seen Dr. Murrel Arnt but retired- not planning on surgery they suggested  #Health maintenance -  - colonoscopy with Dr. Andriette Keeling in 2019 with 5 year follow up  - had shingrix and RSV- will send us  the dates   Recommended follow up: Return in about 14 weeks (around 07/25/2024) for followup or sooner if needed.Schedule b4 you leave.  Meds ordered this encounter  Medications   glucose blood (ONETOUCH ULTRA) test strip    Sig: USE TO TEST SUGAR TWICE A DAY    Dispense:  100 strip    Refill:  3   Return precautions advised. Clarisa Crooked, MD

## 2024-04-19 ENCOUNTER — Ambulatory Visit: Payer: Self-pay | Admitting: Family Medicine

## 2024-04-19 ENCOUNTER — Encounter: Payer: Self-pay | Admitting: Cardiology

## 2024-04-19 LAB — MICROALBUMIN / CREATININE URINE RATIO
Creatinine,U: 15.7 mg/dL
Microalb Creat Ratio: UNDETERMINED mg/g (ref 0.0–30.0)
Microalb, Ur: <0.7 mg/dL

## 2024-04-25 DIAGNOSIS — J3089 Other allergic rhinitis: Secondary | ICD-10-CM | POA: Diagnosis not present

## 2024-04-25 DIAGNOSIS — J301 Allergic rhinitis due to pollen: Secondary | ICD-10-CM | POA: Diagnosis not present

## 2024-04-26 ENCOUNTER — Telehealth: Payer: Self-pay

## 2024-04-26 ENCOUNTER — Telehealth: Payer: Self-pay | Admitting: Gastroenterology

## 2024-04-26 NOTE — Telephone Encounter (Signed)
 Patient called and stated that she is needing to have a colonoscopy. Patient has a referral. Patient had a colonoscopy 5 years ago with Dr. Andriette Keeling. Advise patient we need her recent colon and pathology report. Patient stated that she will have them faxed over those records. Patient is ok to schedule with anyone. Please advise.

## 2024-04-26 NOTE — Telephone Encounter (Signed)
 Call and spoke with pt and all questions answered.

## 2024-04-27 DIAGNOSIS — H43822 Vitreomacular adhesion, left eye: Secondary | ICD-10-CM | POA: Diagnosis not present

## 2024-04-27 DIAGNOSIS — E113211 Type 2 diabetes mellitus with mild nonproliferative diabetic retinopathy with macular edema, right eye: Secondary | ICD-10-CM | POA: Diagnosis not present

## 2024-04-27 DIAGNOSIS — H33321 Round hole, right eye: Secondary | ICD-10-CM | POA: Diagnosis not present

## 2024-04-27 DIAGNOSIS — H33322 Round hole, left eye: Secondary | ICD-10-CM | POA: Diagnosis not present

## 2024-04-27 DIAGNOSIS — E113412 Type 2 diabetes mellitus with severe nonproliferative diabetic retinopathy with macular edema, left eye: Secondary | ICD-10-CM | POA: Diagnosis not present

## 2024-04-27 DIAGNOSIS — H401131 Primary open-angle glaucoma, bilateral, mild stage: Secondary | ICD-10-CM | POA: Diagnosis not present

## 2024-04-27 DIAGNOSIS — H43821 Vitreomacular adhesion, right eye: Secondary | ICD-10-CM | POA: Diagnosis not present

## 2024-04-27 DIAGNOSIS — H35352 Cystoid macular degeneration, left eye: Secondary | ICD-10-CM | POA: Diagnosis not present

## 2024-05-01 DIAGNOSIS — H35352 Cystoid macular degeneration, left eye: Secondary | ICD-10-CM | POA: Diagnosis not present

## 2024-05-01 DIAGNOSIS — H43822 Vitreomacular adhesion, left eye: Secondary | ICD-10-CM | POA: Diagnosis not present

## 2024-05-01 DIAGNOSIS — E113412 Type 2 diabetes mellitus with severe nonproliferative diabetic retinopathy with macular edema, left eye: Secondary | ICD-10-CM | POA: Diagnosis not present

## 2024-05-01 DIAGNOSIS — H43821 Vitreomacular adhesion, right eye: Secondary | ICD-10-CM | POA: Diagnosis not present

## 2024-05-01 DIAGNOSIS — E113211 Type 2 diabetes mellitus with mild nonproliferative diabetic retinopathy with macular edema, right eye: Secondary | ICD-10-CM | POA: Diagnosis not present

## 2024-05-01 DIAGNOSIS — H33322 Round hole, left eye: Secondary | ICD-10-CM | POA: Diagnosis not present

## 2024-05-01 DIAGNOSIS — H33321 Round hole, right eye: Secondary | ICD-10-CM | POA: Diagnosis not present

## 2024-05-01 DIAGNOSIS — H401131 Primary open-angle glaucoma, bilateral, mild stage: Secondary | ICD-10-CM | POA: Diagnosis not present

## 2024-05-01 LAB — HM DIABETES EYE EXAM

## 2024-05-02 DIAGNOSIS — J3081 Allergic rhinitis due to animal (cat) (dog) hair and dander: Secondary | ICD-10-CM | POA: Diagnosis not present

## 2024-05-02 DIAGNOSIS — J3089 Other allergic rhinitis: Secondary | ICD-10-CM | POA: Diagnosis not present

## 2024-05-02 DIAGNOSIS — J301 Allergic rhinitis due to pollen: Secondary | ICD-10-CM | POA: Diagnosis not present

## 2024-05-03 ENCOUNTER — Encounter: Payer: Medicare HMO | Admitting: Nurse Practitioner

## 2024-05-09 DIAGNOSIS — J301 Allergic rhinitis due to pollen: Secondary | ICD-10-CM | POA: Diagnosis not present

## 2024-05-09 DIAGNOSIS — J3081 Allergic rhinitis due to animal (cat) (dog) hair and dander: Secondary | ICD-10-CM | POA: Diagnosis not present

## 2024-05-09 DIAGNOSIS — J3089 Other allergic rhinitis: Secondary | ICD-10-CM | POA: Diagnosis not present

## 2024-05-16 DIAGNOSIS — J3089 Other allergic rhinitis: Secondary | ICD-10-CM | POA: Diagnosis not present

## 2024-05-16 DIAGNOSIS — J301 Allergic rhinitis due to pollen: Secondary | ICD-10-CM | POA: Diagnosis not present

## 2024-05-23 DIAGNOSIS — J3081 Allergic rhinitis due to animal (cat) (dog) hair and dander: Secondary | ICD-10-CM | POA: Diagnosis not present

## 2024-05-23 DIAGNOSIS — J301 Allergic rhinitis due to pollen: Secondary | ICD-10-CM | POA: Diagnosis not present

## 2024-05-23 DIAGNOSIS — J3089 Other allergic rhinitis: Secondary | ICD-10-CM | POA: Diagnosis not present

## 2024-05-30 DIAGNOSIS — J301 Allergic rhinitis due to pollen: Secondary | ICD-10-CM | POA: Diagnosis not present

## 2024-05-30 DIAGNOSIS — J3081 Allergic rhinitis due to animal (cat) (dog) hair and dander: Secondary | ICD-10-CM | POA: Diagnosis not present

## 2024-05-30 DIAGNOSIS — J3089 Other allergic rhinitis: Secondary | ICD-10-CM | POA: Diagnosis not present

## 2024-05-31 DIAGNOSIS — E113211 Type 2 diabetes mellitus with mild nonproliferative diabetic retinopathy with macular edema, right eye: Secondary | ICD-10-CM | POA: Diagnosis not present

## 2024-05-31 DIAGNOSIS — E113412 Type 2 diabetes mellitus with severe nonproliferative diabetic retinopathy with macular edema, left eye: Secondary | ICD-10-CM | POA: Diagnosis not present

## 2024-05-31 DIAGNOSIS — H35352 Cystoid macular degeneration, left eye: Secondary | ICD-10-CM | POA: Diagnosis not present

## 2024-05-31 DIAGNOSIS — H43822 Vitreomacular adhesion, left eye: Secondary | ICD-10-CM | POA: Diagnosis not present

## 2024-05-31 DIAGNOSIS — H33321 Round hole, right eye: Secondary | ICD-10-CM | POA: Diagnosis not present

## 2024-05-31 DIAGNOSIS — H33322 Round hole, left eye: Secondary | ICD-10-CM | POA: Diagnosis not present

## 2024-05-31 DIAGNOSIS — H43821 Vitreomacular adhesion, right eye: Secondary | ICD-10-CM | POA: Diagnosis not present

## 2024-06-05 DIAGNOSIS — E113211 Type 2 diabetes mellitus with mild nonproliferative diabetic retinopathy with macular edema, right eye: Secondary | ICD-10-CM | POA: Diagnosis not present

## 2024-06-05 DIAGNOSIS — H33321 Round hole, right eye: Secondary | ICD-10-CM | POA: Diagnosis not present

## 2024-06-05 DIAGNOSIS — H33322 Round hole, left eye: Secondary | ICD-10-CM | POA: Diagnosis not present

## 2024-06-05 DIAGNOSIS — E113412 Type 2 diabetes mellitus with severe nonproliferative diabetic retinopathy with macular edema, left eye: Secondary | ICD-10-CM | POA: Diagnosis not present

## 2024-06-05 DIAGNOSIS — H43821 Vitreomacular adhesion, right eye: Secondary | ICD-10-CM | POA: Diagnosis not present

## 2024-06-05 DIAGNOSIS — H43822 Vitreomacular adhesion, left eye: Secondary | ICD-10-CM | POA: Diagnosis not present

## 2024-06-05 DIAGNOSIS — H35352 Cystoid macular degeneration, left eye: Secondary | ICD-10-CM | POA: Diagnosis not present

## 2024-06-06 DIAGNOSIS — J3081 Allergic rhinitis due to animal (cat) (dog) hair and dander: Secondary | ICD-10-CM | POA: Diagnosis not present

## 2024-06-06 DIAGNOSIS — J301 Allergic rhinitis due to pollen: Secondary | ICD-10-CM | POA: Diagnosis not present

## 2024-06-06 DIAGNOSIS — J3089 Other allergic rhinitis: Secondary | ICD-10-CM | POA: Diagnosis not present

## 2024-06-08 ENCOUNTER — Ambulatory Visit: Payer: Medicare HMO | Admitting: Internal Medicine

## 2024-06-13 DIAGNOSIS — J3081 Allergic rhinitis due to animal (cat) (dog) hair and dander: Secondary | ICD-10-CM | POA: Diagnosis not present

## 2024-06-13 DIAGNOSIS — J301 Allergic rhinitis due to pollen: Secondary | ICD-10-CM | POA: Diagnosis not present

## 2024-06-13 DIAGNOSIS — J3089 Other allergic rhinitis: Secondary | ICD-10-CM | POA: Diagnosis not present

## 2024-06-20 ENCOUNTER — Ambulatory Visit: Payer: Medicare HMO | Admitting: Internal Medicine

## 2024-06-20 DIAGNOSIS — J3089 Other allergic rhinitis: Secondary | ICD-10-CM | POA: Diagnosis not present

## 2024-06-20 DIAGNOSIS — J3081 Allergic rhinitis due to animal (cat) (dog) hair and dander: Secondary | ICD-10-CM | POA: Diagnosis not present

## 2024-06-20 DIAGNOSIS — J301 Allergic rhinitis due to pollen: Secondary | ICD-10-CM | POA: Diagnosis not present

## 2024-06-29 DIAGNOSIS — E113211 Type 2 diabetes mellitus with mild nonproliferative diabetic retinopathy with macular edema, right eye: Secondary | ICD-10-CM | POA: Diagnosis not present

## 2024-06-29 DIAGNOSIS — H33321 Round hole, right eye: Secondary | ICD-10-CM | POA: Diagnosis not present

## 2024-06-29 DIAGNOSIS — H43821 Vitreomacular adhesion, right eye: Secondary | ICD-10-CM | POA: Diagnosis not present

## 2024-06-29 DIAGNOSIS — H35352 Cystoid macular degeneration, left eye: Secondary | ICD-10-CM | POA: Diagnosis not present

## 2024-06-29 DIAGNOSIS — E113412 Type 2 diabetes mellitus with severe nonproliferative diabetic retinopathy with macular edema, left eye: Secondary | ICD-10-CM | POA: Diagnosis not present

## 2024-06-29 LAB — HM DIABETES EYE EXAM

## 2024-07-06 ENCOUNTER — Encounter: Payer: Self-pay | Admitting: Family Medicine

## 2024-07-25 ENCOUNTER — Ambulatory Visit: Admitting: Family Medicine

## 2024-07-25 ENCOUNTER — Ambulatory Visit: Payer: Self-pay | Admitting: Family Medicine

## 2024-07-25 ENCOUNTER — Encounter: Payer: Self-pay | Admitting: Family Medicine

## 2024-07-25 VITALS — BP 138/80 | HR 67 | Temp 97.5°F | Ht 63.0 in | Wt 200.4 lb

## 2024-07-25 DIAGNOSIS — E785 Hyperlipidemia, unspecified: Secondary | ICD-10-CM

## 2024-07-25 DIAGNOSIS — E1122 Type 2 diabetes mellitus with diabetic chronic kidney disease: Secondary | ICD-10-CM | POA: Diagnosis not present

## 2024-07-25 DIAGNOSIS — Z7984 Long term (current) use of oral hypoglycemic drugs: Secondary | ICD-10-CM | POA: Diagnosis not present

## 2024-07-25 DIAGNOSIS — N182 Chronic kidney disease, stage 2 (mild): Secondary | ICD-10-CM | POA: Diagnosis not present

## 2024-07-25 DIAGNOSIS — E1169 Type 2 diabetes mellitus with other specified complication: Secondary | ICD-10-CM

## 2024-07-25 DIAGNOSIS — I10 Essential (primary) hypertension: Secondary | ICD-10-CM | POA: Diagnosis not present

## 2024-07-25 DIAGNOSIS — R0683 Snoring: Secondary | ICD-10-CM

## 2024-07-25 LAB — COMPREHENSIVE METABOLIC PANEL WITH GFR
ALT: 17 U/L (ref 0–35)
AST: 19 U/L (ref 0–37)
Albumin: 4.2 g/dL (ref 3.5–5.2)
Alkaline Phosphatase: 89 U/L (ref 39–117)
BUN: 13 mg/dL (ref 6–23)
CO2: 25 meq/L (ref 19–32)
Calcium: 9.2 mg/dL (ref 8.4–10.5)
Chloride: 106 meq/L (ref 96–112)
Creatinine, Ser: 0.8 mg/dL (ref 0.40–1.20)
GFR: 73.43 mL/min (ref >60.00)
Glucose, Bld: 115 mg/dL — ABNORMAL HIGH (ref 70–99)
Potassium: 3.9 meq/L (ref 3.5–5.1)
Sodium: 142 meq/L (ref 135–145)
Total Bilirubin: 0.5 mg/dL (ref 0.2–1.2)
Total Protein: 6.9 g/dL (ref 6.0–8.3)

## 2024-07-25 LAB — CBC WITH DIFFERENTIAL/PLATELET
Basophils Absolute: 0 K/uL (ref 0.0–0.1)
Basophils Relative: 0.2 % (ref 0.0–3.0)
Eosinophils Absolute: 0.1 K/uL (ref 0.0–0.7)
Eosinophils Relative: 1.8 % (ref 0.0–5.0)
HCT: 39.2 % (ref 36.0–46.0)
Hemoglobin: 12.3 g/dL (ref 12.0–15.0)
Lymphocytes Relative: 46.5 % — ABNORMAL HIGH (ref 12.0–46.0)
Lymphs Abs: 2.3 K/uL (ref 0.7–4.0)
MCHC: 31.3 g/dL (ref 30.0–36.0)
MCV: 85 fl (ref 78.0–100.0)
Monocytes Absolute: 0.3 K/uL (ref 0.1–1.0)
Monocytes Relative: 5.4 % (ref 3.0–12.0)
Neutro Abs: 2.3 K/uL (ref 1.4–7.7)
Neutrophils Relative %: 46.1 % (ref 43.0–77.0)
Platelets: 266 K/uL (ref 150.0–400.0)
RBC: 4.61 Mil/uL (ref 3.87–5.11)
RDW: 16 % — ABNORMAL HIGH (ref 11.5–15.5)
WBC: 4.9 K/uL (ref 4.0–10.5)

## 2024-07-25 LAB — HEMOGLOBIN A1C: Hgb A1c MFr Bld: 7.1 % — ABNORMAL HIGH (ref 4.6–6.5)

## 2024-07-25 NOTE — Progress Notes (Signed)
 Phone [phone] In person visit   Subjective:   Lauren Lopez is a 73 y.o. year old very pleasant female patient who presents for/with See problem oriented charting Chief Complaint  Patient presents with   Diabetes   Medical Management of Chronic Issues    Pts eye doctor states her eye doctor wants her to have a sleep study and she would like to speak about that;    Past Medical History-  Patient Active Problem List   Diagnosis Date Noted   Prolonged QT interval 02/20/2022    Priority: High   Type 2 diabetes mellitus with stage 2 chronic kidney disease, without long-term current use of insulin  ([name]) 04/30/2020    Priority: High   Diabetic retinopathy ([name]) 04/18/2024    Priority: Medium    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 02/20/2022 03/03/2022    Priority: Medium    Hepatic steatosis 03/03/2022    Priority: Medium    SVT (supraventricular tachycardia) ([name])     Priority: Medium    Allergic rhinitis due to pollen 04/03/2021    Priority: Medium    Hyperlipidemia associated with type 2 diabetes mellitus ([name]) 04/30/2020    Priority: Medium    Osteoporosis 06/01/2017    Priority: Medium    GERD (gastroesophageal reflux disease)     Priority: Medium    Vitamin D  deficiency     Priority: Medium    Asthma     Priority: Medium    Essential hypertension 08/09/2009    Priority: Medium    Lipoma of right upper extremity 05/01/2020    Priority: Low   Anterolisthesis of lumbar spine 12/21/2023    Priority: 1.   Arthralgia of hip 02/01/2023    Priority: 1.   S/P knee replacement 02/20/2022    Priority: 1.   S/P total knee arthroplasty, right 02/02/2022    Priority: 1.    Medications- reviewed and updated Current Outpatient Medications  Medication Sig Dispense Refill   ADVAIR  HFA 230-21 MCG/ACT inhaler Use  2 Inhalations  2 x /day  15 minutes apart   2 x /day (every 12 hours) 12 each 2   blood glucose meter kit and supplies Test sugars twice daily. Dx  E11.9 1 each 0   Cholecalciferol  (VITAMIN D3) 250 MCG (10000 UT) capsule Take 10,000 Units by mouth daily.     diltiazem  (CARDIZEM  CD) 300 MG 24 hr capsule TAKE 1 CAPSULE BY MOUTH EVERY DAY 90 capsule 2   docusate sodium  (COLACE) 100 MG capsule Take 100 mg by mouth daily.     EPINEPHrine  0.3 mg/0.3 mL IJ SOAJ injection Inject 0.3 mg into the muscle as needed for anaphylaxis.     fexofenadine  (ALLEGRA ) 180 MG tablet Take 1 tablet (180 mg total) by mouth daily. 90 tablet 6   glucose blood (ONETOUCH ULTRA) test strip USE TO TEST SUGAR TWICE A DAY 100 strip 3   guaiFENesin  (MUCINEX  PO) Take by mouth as needed.     ketorolac  (ACULAR ) 0.5 % ophthalmic solution Place 1 drop into both eyes 4 (four) times daily.     Lancets (ONETOUCH DELICA PLUS LANCET33G) MISC TEST SUGAR TWICE A DAY 200 each 1   Magnesium  250 MG TABS Take 250 mg by mouth in the morning and at bedtime.     metFORMIN  (GLUCOPHAGE -XR) 500 MG 24 hr tablet Take 1 tablet  2 x /day  with Meals for Diabetes. 180 tablet 0   Misc Natural Products (NEURIVA PO) Take 1 tablet  by mouth daily.     mometasone  (NASONEX ) 50 MCG/ACT nasal spray Place 2 sprays into the nose daily. 51 each 1   nitroGLYCERIN  (NITROSTAT ) 0.4 MG SL tablet Place 1 tablet (0.4 mg total) under the tongue every 5 (five) minutes as needed for chest pain. 25 tablet 3   omeprazole  (PRILOSEC) 40 MG capsule TAKE ONE CAPSULE DAILY FOR INDIGESTION AND HEARTBURN 90 capsule 3   Rhubarb (ESTROVEN MENOPAUSE RELIEF PO) Take 1 capsule by mouth daily.     rosuvastatin  (CRESTOR ) 20 MG tablet TAKE 1 TABLET BY MOUTH EVERY DAY FOR CHOLESTEROL 90 tablet 3   No current facility-administered medications for this visit.     Objective:  BP 138/80 (BP Location: Left Arm, Patient Position: Sitting, Cuff Size: Normal)   Pulse 67   Temp (!) 97.5 F (36.4 C) (Temporal)   Ht 5' 3 (1.6 m)   Wt 200 lb 6.4 oz (90.9 kg)   SpO2 98%   BMI 35.50 kg/m  Gen: NAD, resting comfortably CV: RRR no murmurs rubs  or gallops Lungs: CTAB no crackles, wheeze, rhonchi Ext: trace edema Skin: warm, dry Neuro: grossly normal, moves all extremities     Assessment and Plan   # Health maintenance-colonoscopy in 2019 with Dr. Ryland back for colonoscopy last visit - she is trying to get them records- encouraged her to call to schedule follow up   # Concern for sleep apnea S: Patient had a visit with her eye doctor Dr. Elner and they are concerned about sleep apnea based on less response to injections.  Patient does report snoring and daytime somnolence.   -Daytime somnolence: Sleep study 06/2022 showed no significant OSA  A/P: refer to pulmonary to evaluation for sleep apnea but with prior workup not sure if this is going of the change anything   #Stress-patient reported stress from husband establishing relationship with long-term loss with daughter that they tested negative for biological links. She has left him to it and stress level is better.  -lost dog last week  # Diabetes S: Medication:Metformin  500 mg twice a day extended release- lose stools on 3x a day in past -Prior PCP encouraged Mounjaro  CBGs- 120s- higher if stress up- 180 when lost dog recently Exercise and diet- at [employer] [facility] [employer] [facility] [employer] [facility]  3-4 days a week Lab Results  Component Value Date   HGBA1C 7.0 (H) 04/18/2024   HGBA1C 6.6 (H) 12/13/2023   HGBA1C 6.8 (H) 08/23/2023  A/P: hopefully stable- update a1c today. Continue current meds for now  -sees Dr. Elner for diabetes retinopathy and injections every 4 weeks. Vision only good week or two after that   #hypertension S: medication: Diltiazem  300 mg daily extended release BP Readings from Last 3 Encounters:  07/25/24 138/80  04/18/24 112/72  02/28/24 (!) 141/79  A/P: high acceptable -continue current medications   #hyperlipidemia with aortic arch atherosclerosis  S: Medication:Rosuvastatin  20 mg daily -CT coronary morphology with  calcium  score of 0 on 11/16/2023 and no evidence of CAD  Lab Results  Component Value Date   CHOL 165 12/13/2023   HDL 86 12/13/2023   LDLCALC 62 12/13/2023   TRIG 83 12/13/2023   CHOLHDL 1.9 12/13/2023  A/P: lipids at goal in January- not quite due for repeat     # Asthma/allergies- on allergy  shots with allergist  S: Maintenance Medication: Advair  230-21 mcg 2 puffs twice daily- missing evening dose -Also uses Allegra  and Nasonex  for allergies As needed medication: albuterol . Patient  is using this almost never Breathing pretty good even though advair  just once a day A/P: doing reasonably well- continue current medications- if more wheeze can increase dose to twice daily   # GERD S:Medication: Omeprazole  40 mg.  A/P: reasonable control- continue current medications   -reports mild lower abdominal pain at times- she is going to monitor and return if worsens  #Vitamin D  deficiency S: Medication:  Prior PCP recommended 10,000 units a day Last vitamin D  Lab Results  Component Value Date   VD25OH 38 12/13/2023  A/P: stable- continue current medicines     # Fatty liver-monitor LFTs at least annually and focus on healthy eating/regular exercise - check LFTs today. Stable weight- enocuraged mild weight loss for liver health Wt Readings from Last 3 Encounters:  07/25/24 200 lb 6.4 oz (90.9 kg)  04/18/24 200 lb 6.4 oz (90.9 kg)  02/28/24 200 lb (90.7 kg)   #small thyroid  nodules- check TSH yearly - lats ultrasound 2021 with no repeat needed. Recheck next visit- normal in january  Recommended follow up: Return in about 4 months (around 11/24/2024) for physical or sooner if needed.Schedule b4 you leave. Future Appointments  Date Time Provider Department Center  08/28/2024  1:40 PM [facility] ANNUAL WELLNESS VISIT 1 [facility]   Lab/Order associations:   ICD-10-CM   1. Type 2 diabetes mellitus with stage 2 chronic kidney disease, without long-term current use of insulin  ([name])  E11.22     N18.2     2. Essential hypertension  I10     3. Hyperlipidemia associated with type 2 diabetes mellitus ([name])  E11.69    E78.5     4. Snoring  R06.83       No orders of the defined types were placed in this encounter.   Return precautions advised.  Garnette Lukes, MD

## 2024-07-25 NOTE — Patient Instructions (Addendum)
 Reach back out to Major since you submitted paperwork and have not heard back yet or can call Dr. Carey office to check agian. Still in need of colonoscopy [facility] contact Please call to schedule visit and/or procedure IF you do not hear within a week Address: [address] [place], [place], [place] [address] Phone: [phone]   We have placed a referral for you today to pulmonary- please call their # if you do not hear within a week (may be listed below or you may see mychart message within a few days with #).   Please stop by lab before you go If you have mychart- we will send your results within 3 business days of us  receiving them.  If you do not have mychart- we will call you about results within 5 business days of us  receiving them.  *please also note that you will see labs on mychart as soon as they post. I will later go in and write notes on them- will say notes from Dr. Katrinka    Recommended follow up: Return in about 4 months (around 11/24/2024) for physical or sooner if needed.Schedule b4 you leave.

## 2024-08-08 LAB — HM DIABETES EYE EXAM

## 2024-08-09 ENCOUNTER — Ambulatory Visit: Payer: Medicare HMO | Admitting: Nurse Practitioner

## 2024-08-22 LAB — HM MAMMOGRAPHY

## 2024-08-23 ENCOUNTER — Ambulatory Visit: Payer: Medicare HMO | Admitting: Nurse Practitioner

## 2024-08-24 ENCOUNTER — Encounter: Payer: Self-pay | Admitting: Family Medicine

## 2024-08-28 ENCOUNTER — Ambulatory Visit

## 2024-08-28 VITALS — BP 140/78 | HR 86 | Temp 98.3°F | Ht 63.0 in | Wt 197.8 lb

## 2024-08-28 DIAGNOSIS — Z0000 Encounter for general adult medical examination without abnormal findings: Secondary | ICD-10-CM | POA: Diagnosis not present

## 2024-08-28 DIAGNOSIS — Z1211 Encounter for screening for malignant neoplasm of colon: Secondary | ICD-10-CM

## 2024-08-28 NOTE — Patient Instructions (Addendum)
 Ms. Osoria,  Thank you for taking the time for your Medicare Wellness Visit. I appreciate your continued commitment to your health goals. Please review the care plan we discussed, and feel free to reach out if I can assist you further.  Medicare recommends these wellness visits once per year to help you and your care team stay ahead of potential health issues. These visits are designed to focus on prevention, allowing your provider to concentrate on managing your acute and chronic conditions during your regular appointments.  Please note that Annual Wellness Visits do not include a physical exam. Some assessments may be limited, especially if the visit was conducted virtually. If needed, we may recommend a separate in-person follow-up with your provider.  Ongoing Care Seeing your primary care provider every 3 to 6 months helps us  monitor your health and provide consistent, personalized care.   Referrals If a referral was made during today's visit and you haven't received any updates within two weeks, please contact the referred provider directly to check on the status.  Recommended Screenings:  Health Maintenance  Topic Date Due   Colon Cancer Screening  09/09/2023   COVID-19 Vaccine (6 - 2025-26 season) 07/31/2024   Medicare Annual Wellness Visit  08/22/2024   Complete foot exam   08/22/2024   Flu Shot  02/27/2025*   Hemoglobin A1C  01/25/2025   Yearly kidney health urinalysis for diabetes  04/18/2025   Yearly kidney function blood test for diabetes  07/25/2025   Eye exam for diabetics  08/08/2025   DEXA scan (bone density measurement)  08/18/2025   Breast Cancer Screening  08/22/2026   Pneumococcal Vaccine for age over 74  Completed   Hepatitis C Screening  Completed   Zoster (Shingles) Vaccine  Completed   HPV Vaccine  Aged Out   Meningitis B Vaccine  Aged Out   DTaP/Tdap/Td vaccine  Discontinued  *Topic was postponed. The date shown is not the original due date.        02/28/2024   12:46 PM  Advanced Directives  Does Patient Have a Medical Advance Directive? No  Would patient like information on creating a medical advance directive? No - Patient declined   Advance Care Planning is important because it: Ensures you receive medical care that aligns with your values, goals, and preferences. Provides guidance to your family and loved ones, reducing the emotional burden of decision-making during critical moments.  Vision: Annual vision screenings are recommended for early detection of glaucoma, cataracts, and diabetic retinopathy. These exams can also reveal signs of chronic conditions such as diabetes and high blood pressure.  Dental: Annual dental screenings help detect early signs of oral cancer, gum disease, and other conditions linked to overall health, including heart disease and diabetes.  Please see the attached documents for additional preventive care recommendations.   [facility]  [address], [place], [place] [address] Phone: [phone]

## 2024-08-28 NOTE — Progress Notes (Signed)
 Subjective:   Lauren Lopez is a 73 y.o. who presents for a Medicare Wellness preventive visit.  As a reminder, Annual Wellness Visits don't include a physical exam, and some assessments may be limited, especially if this visit is performed virtually. We may recommend an in-person follow-up visit with your provider if needed.  Visit Complete: In person    Persons Participating in Visit: Patient.  AWV Questionnaire: No: Patient Medicare AWV questionnaire was not completed prior to this visit.  Cardiac Risk Factors include: advanced age (>43men, >49 women);dyslipidemia;diabetes mellitus;hypertension;obesity (BMI >30kg/m2)     Objective:    Today's Vitals   08/28/24 1336  BP: (!) 140/78  Pulse: 86  Temp: 98.3 F (36.8 C)  SpO2: 98%  Weight: 197 lb 12.8 oz (89.7 kg)  Height: 5' 3 (1.6 m)   Body mass index is 35.04 kg/m.     08/28/2024    1:49 PM 02/28/2024   12:46 PM 08/23/2023   12:09 PM 08/17/2022    9:58 AM 07/27/2022    1:55 AM 02/02/2022   10:11 AM 01/23/2022   11:19 AM  Advanced Directives  Does Patient Have a Medical Advance Directive? No No No No No No No  Would patient like information on creating a medical advance directive? No - Patient declined No - Patient declined No - Patient declined  Yes (MAU/Ambulatory/Procedural Areas - Information given) No - Patient declined No - Patient declined    Current Medications (verified) Outpatient Encounter Medications as of 08/28/2024  Medication Sig   ADVAIR  HFA 230-21 MCG/ACT inhaler Use  2 Inhalations  2 x /day  15 minutes apart   2 x /day (every 12 hours)   blood glucose meter kit and supplies Test sugars twice daily. Dx E11.9   Cholecalciferol  (VITAMIN D3) 250 MCG (10000 UT) capsule Take 10,000 Units by mouth daily.   diltiazem  (CARDIZEM  CD) 300 MG 24 hr capsule TAKE 1 CAPSULE BY MOUTH EVERY DAY   docusate sodium  (COLACE) 100 MG capsule Take 100 mg by mouth daily.   EPINEPHrine  0.3 mg/0.3 mL IJ SOAJ injection  Inject 0.3 mg into the muscle as needed for anaphylaxis.   fexofenadine  (ALLEGRA ) 180 MG tablet Take 1 tablet (180 mg total) by mouth daily.   glucose blood (ONETOUCH ULTRA) test strip USE TO TEST SUGAR TWICE A DAY   guaiFENesin  (MUCINEX  PO) Take by mouth as needed.   ketorolac  (ACULAR ) 0.5 % ophthalmic solution Place 1 drop into both eyes 4 (four) times daily.   Lancets (ONETOUCH DELICA PLUS LANCET33G) MISC TEST SUGAR TWICE A DAY   Magnesium  250 MG TABS Take 250 mg by mouth in the morning and at bedtime.   metFORMIN  (GLUCOPHAGE -XR) 500 MG 24 hr tablet Take 1 tablet  2 x /day  with Meals for Diabetes.   Misc Natural Products (NEURIVA PO) Take 1 tablet by mouth daily.   mometasone  (NASONEX ) 50 MCG/ACT nasal spray Place 2 sprays into the nose daily.   nitroGLYCERIN  (NITROSTAT ) 0.4 MG SL tablet Place 1 tablet (0.4 mg total) under the tongue every 5 (five) minutes as needed for chest pain.   omeprazole  (PRILOSEC) 40 MG capsule TAKE ONE CAPSULE DAILY FOR INDIGESTION AND HEARTBURN   rosuvastatin  (CRESTOR ) 20 MG tablet TAKE 1 TABLET BY MOUTH EVERY DAY FOR CHOLESTEROL   Rhubarb (ESTROVEN MENOPAUSE RELIEF PO) Take 1 capsule by mouth daily.   No facility-administered encounter medications on file as of 08/28/2024.    Allergies (verified) Metronidazole and Ppd [tuberculin purified protein  derivative]   History: Past Medical History:  Diagnosis Date   Acute asthma exacerbation 02/20/2022   Arthritis    right hand, back (04/03/2016)   Asthma    takes weekly allergy  shots, none in 5-6 weeks due to knee surgery   Bilateral carpal tunnel syndrome    Chronic lower back pain    GERD (gastroesophageal reflux disease)    Hyperlipidemia    Hypertension    Multiple allergies    Pneumonia 03/2012   Positive PPD    they told me it wasn't positive; I was allergic to the test itself   Right rotator cuff tear 01/20/2013   Type II diabetes mellitus ([name])    Vasculitis 04/03/2016   Vitamin deficiency     Wears dentures    upper   Wears glasses    Past Surgical History:  Procedure Laterality Date   ABDOMINAL HYSTERECTOMY     BILATERAL SALPINGOOPHORECTOMY  11/28/2010   open laparoscopy with adhesiolysis also   CARPAL TUNNEL RELEASE  07/15/2012   Procedure: CARPAL TUNNEL RELEASE;  Surgeon: Lamar LULLA Leonor Mickey., MD;  Location: [facility];  Service: Orthopedics;  Laterality: Left;   CARPAL TUNNEL RELEASE Right 05/31/2014   Procedure: RIGHT CARPAL TUNNEL RELEASE;  Surgeon: Lamar Leonor Mickey LULLA, MD;  Location: [facility];  Service: Orthopedics;  Laterality: Right;   COLONOSCOPY     KNEE ARTHROSCOPY Bilateral 2005-2016   right-left   SHOULDER ARTHROSCOPY WITH ROTATOR CUFF REPAIR AND SUBACROMIAL DECOMPRESSION Right 01/20/2013   Procedure: RIGHT ARTHROSCOPY SHOULDER DEBRIDEMENT LIMITED, ARTHROSCOPY SHOULDER DECOMPRESSION SUBACROMIAL PARTIAL ACROMIOPLASTY WITH CORACOACROMIAL RELEASE, ROTATOR CUFF REPAIR ;  Surgeon: Fonda SHAUNNA Olmsted, MD;  Location: [facility];  Service: Orthopedics;  Laterality: Right;  RIGHT SHOULDER SCOPE DEBRIDEMENT, ACRIOMIOPLASTY, ROTATOR CUFF REPAIR   SVT ABLATION N/A 08/17/2022   Procedure: SVT ABLATION;  Surgeon: Waddell Danelle ORN, MD;  Location: [facility];  Service: Cardiovascular;  Laterality: N/A;   TOTAL KNEE ARTHROPLASTY Right 02/02/2022   Procedure: TOTAL KNEE ARTHROPLASTY;  Surgeon: Melodi Lerner, MD;  Location: [facility];  Service: Orthopedics;  Laterality: Right;   TUBAL LIGATION     Family History  Problem Relation Age of Onset   Hypertension Mother    Dementia Mother    Hyperlipidemia Mother    Hypertension Father    Alzheimer's disease Father    Hyperlipidemia Father    Tongue cancer Sister        smoker   Cirrhosis Brother    Hypertension Brother    COPD Brother        smoker   Hypertension Brother    High blood pressure Brother    Social History   Socioeconomic History   Marital status: Married    Spouse  name: Not on file   Number of children: Not on file   Years of education: Not on file   Highest education level: Not on file  Occupational History   Occupation: retired  Tobacco Use   Smoking status: Never   Smokeless tobacco: Never  Vaping Use   Vaping status: Never Used  Substance and Sexual Activity   Alcohol use: No   Drug use: No   Sexual activity: Not Currently    Birth control/protection: Post-menopausal  Other Topics Concern   Not on file  Social History Narrative   Married. 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, gardening, fishing ideally   Social Drivers of Health   Financial Resource Strain: Low Risk  (08/28/2024)   Overall Financial Resource Strain (CARDIA)    Difficulty of Paying Living Expenses: Not hard at all  Food Insecurity: No Food Insecurity (08/28/2024)   Hunger Vital Sign    Worried About Running Out of Food in the Last Year: Never true    Ran Out of Food in the Last Year: Never true  Transportation Needs: No Transportation Needs (08/28/2024)   PRAPARE - Administrator, Civil Service (Medical): No    Lack of Transportation (Non-Medical): No  Physical Activity: Sufficiently Active (08/28/2024)   Exercise Vital Sign    Days of Exercise per Week: 3 days    Minutes of Exercise per Session: 60 min  Stress: No Stress Concern Present (08/28/2024)   [employer] of Occupational Health - Occupational Stress Questionnaire    Feeling of Stress: Not at all  Social Connections: Moderately Integrated (08/28/2024)   Social Connection and Isolation Panel    Frequency of Communication with Friends and Family: More than three times a week    Frequency of Social Gatherings with Friends and Family: More than three times a week    Attends Religious Services: More than 4 times per year     Active Member of [employer] or Organizations: No    Attends Engineer, structural: Never    Marital Status: Married    Tobacco Counseling Counseling given: Not Answered    Clinical Intake:  Pre-visit preparation completed: Yes  Pain : No/denies pain     BMI - recorded: 35.04 Nutritional Status: BMI > 30  Obese Nutritional Risks: None Diabetes: Yes CBG done?: No Did pt. bring in CBG monitor from home?: No  Lab Results  Component Value Date   HGBA1C 7.1 (H) 07/25/2024   HGBA1C 7.0 (H) 04/18/2024   HGBA1C 6.6 (H) 12/13/2023     How often do you need to have someone help you when you read instructions, pamphlets, or other written materials from your doctor or pharmacy?: 1 - Never  Interpreter Needed?: No  Information entered by :: Ellouise Haws, LPN   Activities of Daily Living     08/28/2024    1:41 PM 11/29/2023   10:54 PM  In your present state of health, do you have any difficulty performing the following activities:  Hearing? 0 0  Vision? 0 0  Difficulty concentrating or making decisions? 0 0  Walking or climbing stairs? 0 0  Dressing or bathing? 0 0  Doing errands, shopping? 0 0  Preparing Food and eating ? N   Using the Toilet? N   In the past six months, have you accidently leaked urine? Y   Comment wears a pad at times   Do you have problems with loss of bowel control? N   Managing your Medications? N   Managing your Finances? N   Housekeeping or managing your Housekeeping? N     Patient Care Team: Katrinka Garnette KIDD, MD as PCP - General (Family Medicine) Kate Lonni CROME, MD as PCP - Cardiology (Cardiology) Sypher, Lamar, MD (Inactive) as Consulting Physician (Orthopedic Surgery) Cleatus Collar, MD as Consulting Physician (Ophthalmology) Luis Purchase, MD as Consulting Physician (Gastroenterology) Meisinger, Krystal, MD as Consulting Physician (Obstetrics and Gynecology) Melodi Lerner, MD as Consulting Physician (Orthopedic  Surgery) Josefina Chew, MD  as Consulting Physician (Orthopedic Surgery) Frutoso Luz, MD as Referring Physician (Allergy ) Rolan Ezra RAMAN, MD as Consulting Physician (Cardiology)  I have updated your Care Teams any recent Medical Services you may have received from other providers in the past year.     Assessment:   This is a routine wellness examination for Jerome.  Hearing/Vision screen Hearing Screening - Comments:: Pt denies any hearing issues  Vision Screening - Comments:: Wears rx glasses - up to date with routine eye exams with Dr Arley Ruder    Goals Addressed             This Visit's Progress    Patient Stated       Weight loss        Depression Screen     08/28/2024    1:46 PM 07/25/2024   10:36 AM 11/29/2023   10:53 PM 08/23/2023   12:10 PM 01/06/2023   11:32 AM 08/27/2022   12:02 AM 07/28/2022    4:03 PM  PHQ 2/9 Scores  PHQ - 2 Score 0 1 0 0 0 0 0  PHQ- 9 Score 0 1         Fall Risk     08/28/2024    1:49 PM 07/25/2024   10:36 AM 11/29/2023   10:53 PM 08/23/2023   12:09 PM 01/06/2023   11:32 AM  Fall Risk   Falls in the past year? 0 0 0 0 0  Number falls in past yr: 0 0  0   Injury with Fall? 0 0  0   Risk for fall due to : No Fall Risks No Fall Risks No Fall Risks No Fall Risks No Fall Risks  Follow up Falls prevention discussed Falls evaluation completed Falls prevention discussed;Education provided;Falls evaluation completed Falls prevention discussed;Falls evaluation completed Falls evaluation completed;Education provided;Falls prevention discussed    MEDICARE RISK AT HOME:  Medicare Risk at Home Any stairs in or around the home?: No If so, are there any without handrails?: No Home free of loose throw rugs in walkways, pet beds, electrical cords, etc?: Yes Adequate lighting in your home to reduce risk of falls?: Yes Life alert?: No Use of a cane, walker or w/c?: No Grab bars in the bathroom?: No Shower chair or bench in shower?: No Elevated  toilet seat or a handicapped toilet?: No  TIMED UP AND GO:  Was the test performed?  Yes  Length of time to ambulate 10 feet: 10 sec Gait steady and fast without use of assistive device  Cognitive Function: 6CIT completed        08/28/2024    1:49 PM  6CIT Screen  What Year? 0 points  What month? 0 points  What time? 0 points  Count back from 20 0 points  Months in reverse 2 points  Repeat phrase 2 points  Total Score 4 points    Immunizations Immunization History  Administered Date(s) Administered    sv, Bivalent, Protein Subunit Rsvpref,pf (Abrysvo) 12/01/2022   INFLUENZA, HIGH DOSE SEASONAL PF 11/10/2017, 09/15/2018, 09/04/2019, 04/26/2020, 10/14/2021, 09/02/2022, 08/23/2023   Influenza, Seasonal, Injecte, Preservative Fre 10/17/2015   Influenza,inj,quad, With Preservative 10/15/2016   Influenza-Unspecified 08/30/2020   PFIZER(Purple Top)SARS-COV-2 Vaccination 12/22/2019, 01/12/2020, 04/26/2020, 04/01/2021   Pfizer Covid-19 Vaccine Bivalent Booster 39yrs & up 08/20/2023   Pneumococcal Conjugate-13 12/22/2018   Pneumococcal Polysaccharide-23 02/04/2017, 04/25/2021, 10/27/2021   Pneumococcal-Unspecified 06/27/2013   Tdap 04/23/2012   Zoster Recombinant(Shingrix) 12/14/2021, 10/06/2022   Zoster, Live 07/31/2015    Screening  Tests Health Maintenance  Topic Date Due   Colonoscopy  09/09/2023   COVID-19 Vaccine (6 - 2025-26 season) 07/31/2024   FOOT EXAM  08/22/2024   Influenza Vaccine  02/27/2025 (Originally 06/30/2024)   HEMOGLOBIN A1C  01/25/2025   Diabetic kidney evaluation - Urine ACR  04/18/2025   Diabetic kidney evaluation - eGFR measurement  07/25/2025   OPHTHALMOLOGY EXAM  08/08/2025   DEXA SCAN  08/18/2025   Medicare Annual Wellness (AWV)  08/28/2025   Mammogram  08/22/2026   Pneumococcal Vaccine: 50+ Years  Completed   Hepatitis C Screening  Completed   Zoster Vaccines- Shingrix  Completed   HPV VACCINES  Aged Out   Meningococcal B Vaccine  Aged Out    DTaP/Tdap/Td  Discontinued    Health Maintenance Items Addressed: See Nurse Notes at the end of this note  Additional Screening:  Vision Screening: Recommended annual ophthalmology exams for early detection of glaucoma and other disorders of the eye. Is the patient up to date with their annual eye exam?  Yes  Who is the provider or what is the name of the office in which the patient attends annual eye exams? Dr Arley Ruder   Dental Screening: Recommended annual dental exams for proper oral hygiene  Community Resource Referral / Chronic Care Management: CRR required this visit?  No   CCM required this visit?  No   Plan:    I have personally reviewed and noted the following in the patient's chart:   Medical and social history Use of alcohol, tobacco or illicit drugs  Current medications and supplements including opioid prescriptions. Patient is not currently taking opioid prescriptions. Functional ability and status Nutritional status Physical activity Advanced directives List of other physicians Hospitalizations, surgeries, and ER visits in previous 12 months Vitals Screenings to include cognitive, depression, and falls Referrals and appointments  In addition, I have reviewed and discussed with patient certain preventive protocols, quality metrics, and best practice recommendations. A written personalized care plan for preventive services as well as general preventive health recommendations were provided to patient.   Ellouise VEAR Haws, LPN   [id number]   After Visit Summary: (MyChart) Due to this being a telephonic visit, the after visit summary with patients personalized plan was offered to patient via MyChart   Notes: Nothing significant to report at this time.

## 2024-09-12 ENCOUNTER — Encounter: Payer: Medicare HMO | Admitting: Nurse Practitioner

## 2024-09-14 ENCOUNTER — Other Ambulatory Visit: Payer: Self-pay | Admitting: Family Medicine

## 2024-09-14 DIAGNOSIS — E1122 Type 2 diabetes mellitus with diabetic chronic kidney disease: Secondary | ICD-10-CM

## 2024-09-14 DIAGNOSIS — E1169 Type 2 diabetes mellitus with other specified complication: Secondary | ICD-10-CM

## 2024-09-19 NOTE — Telephone Encounter (Signed)
 Patient came in today and brought a release of information for us  to fax to [facility] to request prior records for procedures & pathology.  Patient is still requesting a transfer of care since Dr. Luis is no longer available as an option for her gastroenterologist.  Release & confirmation scanned into patient's chart.  She prefers to see Dr. Albertus as her husband also sees him.

## 2024-09-21 ENCOUNTER — Encounter: Payer: Medicare HMO | Admitting: Nurse Practitioner

## 2024-10-02 ENCOUNTER — Encounter: Payer: Self-pay | Admitting: Radiology

## 2024-10-09 LAB — OPHTHALMOLOGY REPORT-SCANNED

## 2024-10-13 ENCOUNTER — Encounter (HOSPITAL_BASED_OUTPATIENT_CLINIC_OR_DEPARTMENT_OTHER): Payer: Self-pay | Admitting: Pulmonary Disease

## 2024-10-13 ENCOUNTER — Ambulatory Visit (INDEPENDENT_AMBULATORY_CARE_PROVIDER_SITE_OTHER): Admitting: Pulmonary Disease

## 2024-10-13 VITALS — BP 134/88 | HR 62 | Ht 63.0 in | Wt 199.3 lb

## 2024-10-13 DIAGNOSIS — G4734 Idiopathic sleep related nonobstructive alveolar hypoventilation: Secondary | ICD-10-CM

## 2024-10-13 DIAGNOSIS — G2581 Restless legs syndrome: Secondary | ICD-10-CM | POA: Diagnosis not present

## 2024-10-13 NOTE — Patient Instructions (Addendum)
 X Blood work today  X ONO /RA   VISIT SUMMARY: Today, we discussed your sleep disorder and asthma management. You reported experiencing restless sleep with frequent leg movements and sensations of 'pins or needles' at night. We also reviewed your asthma treatment, which you manage with Symbicort and an inhaler.  YOUR PLAN: -RESTLESS LEGS SYNDROME: Restless legs syndrome is a condition that causes an uncontrollable urge to move your legs, usually due to an uncomfortable sensation. We will check your iron levels with a blood test, as low iron can contribute to these symptoms. If your iron levels are low, we will start iron supplements. If your iron levels are normal, we may try medication specifically for restless legs syndrome.   INSTRUCTIONS: Please complete the blood test to check your iron levels as soon as possible. Follow up with us  once the results are available so we can determine the next steps for managing your restless legs syndrome. Check oxygen  level during sleep                      Contains text generated by [employer]bridge.                                 Contains text generated by [employer]bridge.

## 2024-10-13 NOTE — Progress Notes (Signed)
 Epworth Sleepiness Scale  Use the following scale to choose the most appropriate number for each situation. 0 Would never nod off 1  Slight  chance of nodding off 2 Moderate chance of nodding off 3 High chance of nodding off  Sitting and reading: 0 Watching TV: 1 Sitting, inactive, in a public place (e.g., in a meeting, theater, or dinner event): 0 As a passenger in a car for an hour or more without stopping for a break: 0 Lying down to rest when circumstances permit:0 Sitting and talking to someone: 0 Sitting quietly after a meal without alcohol: 0 In a car, while stopped for a few  minutes in traffic or at a light: 0  TOTOAL: 1

## 2024-10-13 NOTE — Progress Notes (Signed)
 New Patient Pulmonology Office Visit   Subjective:  Patient ID: Lauren Lopez, female    DOB: Sep 13, 1951  MRN: [id number]  Referred by: Katrinka Garnette KIDD, MD  CC:  Chief Complaint  Patient presents with   Consult    Discussed the use of AI scribe software for clinical note transcription with the patient, who gave verbal consent to proceed.  History of Present Illness Lauren Lopez is a 73 year old female who presents for re-evaluation of a sleep disorder.  She experiences restless sleep with frequent leg movements and sensations of 'pins or needles' primarily at night. Despite these symptoms, she remains active during the day without daytime sleepiness. A previous sleep study noted leg movements during sleep.  She uses Symbicort and an inhaler for asthma, diagnosed during a hospital visit. She uses her inhaler twice daily, including at bedtime, which she believes helps with her symptoms. She reports occasional snoring, which she attributes to her asthma. She denies significant weight gain over the past two years, maintaining a weight between 186 and 199 pounds.     Significant tests/ events reviewed  06/2022 NPSG >> TST 313 m, low sat 89%, AHI not sig, severe PMs, arousals 12/h  ROS  Constitutional: negative for anorexia, fevers and sweats  Eyes: negative for irritation, redness and visual disturbance  Ears, nose, mouth, throat, and face: negative for earaches, epistaxis, nasal congestion and sore throat  Respiratory: negative for cough, dyspnea on exertion, sputum and wheezing  Cardiovascular: negative for chest pain, dyspnea, lower extremity edema, orthopnea, palpitations and syncope  Gastrointestinal: negative for abdominal pain, constipation, diarrhea, melena, nausea and vomiting  Genitourinary:negative for dysuria, frequency and hematuria  Hematologic/lymphatic: negative for bleeding, easy bruising and lymphadenopathy  Musculoskeletal:negative for arthralgias,  muscle weakness and stiff joints  Neurological: negative for coordination problems, gait problems, headaches and weakness  Endocrine: negative for diabetic symptoms including polydipsia, polyuria and weight loss   Allergies: Metronidazole and Ppd [tuberculin purified protein derivative]  Current Outpatient Medications:    ADVAIR  HFA 230-21 MCG/ACT inhaler, Use  2 Inhalations  2 x /day  15 minutes apart   2 x /day (every 12 hours), Disp: 12 each, Rfl: 2   blood glucose meter kit and supplies, Test sugars twice daily. Dx E11.9, Disp: 1 each, Rfl: 0   Cholecalciferol  (VITAMIN D3) 250 MCG (10000 UT) capsule, Take 10,000 Units by mouth daily., Disp: , Rfl:    diltiazem  (CARDIZEM  CD) 300 MG 24 hr capsule, TAKE 1 CAPSULE BY MOUTH EVERY DAY, Disp: 90 capsule, Rfl: 2   docusate sodium  (COLACE) 100 MG capsule, Take 100 mg by mouth daily., Disp: , Rfl:    EPINEPHrine  0.3 mg/0.3 mL IJ SOAJ injection, Inject 0.3 mg into the muscle as needed for anaphylaxis., Disp: , Rfl:    fexofenadine  (ALLEGRA ) 180 MG tablet, Take 1 tablet (180 mg total) by mouth daily., Disp: 90 tablet, Rfl: 6   glucose blood (ONETOUCH ULTRA) test strip, USE TO TEST SUGAR TWICE A DAY, Disp: 100 strip, Rfl: 3   guaiFENesin  (MUCINEX  PO), Take by mouth as needed., Disp: , Rfl:    ketorolac  (ACULAR ) 0.5 % ophthalmic solution, Place 1 drop into both eyes 4 (four) times daily., Disp: , Rfl:    Lancets (ONETOUCH DELICA PLUS LANCET33G) MISC, TEST SUGAR TWICE A DAY, Disp: 200 each, Rfl: 1   Magnesium  250 MG TABS, Take 250 mg by mouth in the morning and at bedtime., Disp: , Rfl:    metFORMIN  (GLUCOPHAGE -XR)  500 MG 24 hr tablet, TAKE 1 TABLET BY MOUTH TWICE A DAY WITH MEALS FOR DIABETES, Disp: 180 tablet, Rfl: 0   mometasone  (NASONEX ) 50 MCG/ACT nasal spray, Place 2 sprays into the nose daily., Disp: 51 each, Rfl: 1   nitroGLYCERIN  (NITROSTAT ) 0.4 MG SL tablet, Place 1 tablet (0.4 mg total) under the tongue every 5 (five) minutes as needed for  chest pain., Disp: 25 tablet, Rfl: 3   omeprazole  (PRILOSEC) 40 MG capsule, TAKE ONE CAPSULE DAILY FOR INDIGESTION AND HEARTBURN, Disp: 90 capsule, Rfl: 2   rosuvastatin  (CRESTOR ) 20 MG tablet, TAKE 1 TABLET BY MOUTH EVERY DAY FOR CHOLESTEROL, Disp: 90 tablet, Rfl: 2   Misc Natural Products (NEURIVA PO), Take 1 tablet by mouth daily. (Patient not taking: Reported on 10/13/2024), Disp: , Rfl:    Rhubarb (ESTROVEN MENOPAUSE RELIEF PO), Take 1 capsule by mouth daily. (Patient not taking: Reported on 10/13/2024), Disp: , Rfl:  Past Medical History:  Diagnosis Date   Acute asthma exacerbation 02/20/2022   Arthritis    right hand, back (04/03/2016)   Asthma    takes weekly allergy  shots, none in 5-6 weeks due to knee surgery   Bilateral carpal tunnel syndrome    Chronic lower back pain    GERD (gastroesophageal reflux disease)    Hyperlipidemia    Hypertension    Multiple allergies    Pneumonia 03/2012   Positive PPD    they told me it wasn't positive; I was allergic to the test itself   Right rotator cuff tear 01/20/2013   Type II diabetes mellitus ([name])    Vasculitis 04/03/2016   Vitamin deficiency    Wears dentures    upper   Wears glasses    Past Surgical History:  Procedure Laterality Date   ABDOMINAL HYSTERECTOMY     BILATERAL SALPINGOOPHORECTOMY  11/28/2010   open laparoscopy with adhesiolysis also   CARPAL TUNNEL RELEASE  07/15/2012   Procedure: CARPAL TUNNEL RELEASE;  Surgeon: Lamar LULLA Leonor Mickey., MD;  Location: [facility];  Service: Orthopedics;  Laterality: Left;   CARPAL TUNNEL RELEASE Right 05/31/2014   Procedure: RIGHT CARPAL TUNNEL RELEASE;  Surgeon: Lamar Leonor Mickey LULLA, MD;  Location: [facility];  Service: Orthopedics;  Laterality: Right;   COLONOSCOPY     KNEE ARTHROSCOPY Bilateral 2005-2016   right-left   SHOULDER ARTHROSCOPY WITH ROTATOR CUFF REPAIR AND SUBACROMIAL DECOMPRESSION Right 01/20/2013   Procedure: RIGHT ARTHROSCOPY SHOULDER  DEBRIDEMENT LIMITED, ARTHROSCOPY SHOULDER DECOMPRESSION SUBACROMIAL PARTIAL ACROMIOPLASTY WITH CORACOACROMIAL RELEASE, ROTATOR CUFF REPAIR ;  Surgeon: Fonda SHAUNNA Olmsted, MD;  Location: [facility];  Service: Orthopedics;  Laterality: Right;  RIGHT SHOULDER SCOPE DEBRIDEMENT, ACRIOMIOPLASTY, ROTATOR CUFF REPAIR   SVT ABLATION N/A 08/17/2022   Procedure: SVT ABLATION;  Surgeon: Waddell Danelle ORN, MD;  Location: [facility];  Service: Cardiovascular;  Laterality: N/A;   TOTAL KNEE ARTHROPLASTY Right 02/02/2022   Procedure: TOTAL KNEE ARTHROPLASTY;  Surgeon: Melodi Lerner, MD;  Location: [facility];  Service: Orthopedics;  Laterality: Right;   TUBAL LIGATION     Family History  Problem Relation Age of Onset   Hypertension Mother    Dementia Mother    Hyperlipidemia Mother    Hypertension Father    Alzheimer's disease Father    Hyperlipidemia Father    Tongue cancer Sister        smoker   Cirrhosis Brother    Hypertension Brother    COPD Brother  smoker   Hypertension Brother    High blood pressure Brother    Social History   Socioeconomic History   Marital status: Married    Spouse name: Not on file   Number of children: Not on file   Years of education: Not on file   Highest education level: Not on file  Occupational History   Occupation: retired  Tobacco Use   Smoking status: Never   Smokeless tobacco: Never  Vaping Use   Vaping status: Never Used  Substance and Sexual Activity   Alcohol use: No   Drug use: No   Sexual activity: Not Currently    Birth control/protection: Post-menopausal  Other Topics Concern   Not on file  Social History Narrative   Married. 2 daughters- and husband with 7 children so 9 total- 2 grandchildren from her daughts and many more from husband- possibly 12 or more      Retired from [employer], [employer]: time at home, gardening, fishing ideally   Social Drivers of Corporate Investment Banker Strain:  Low Risk  (08/28/2024)   Overall Financial Resource Strain (CARDIA)    Difficulty of Paying Living Expenses: Not hard at all  Food Insecurity: No Food Insecurity (08/28/2024)   Hunger Vital Sign    Worried About Running Out of Food in the Last Year: Never true    Ran Out of Food in the Last Year: Never true  Transportation Needs: No Transportation Needs (08/28/2024)   PRAPARE - Administrator, Civil Service (Medical): No    Lack of Transportation (Non-Medical): No  Physical Activity: Sufficiently Active (08/28/2024)   Exercise Vital Sign    Days of Exercise per Week: 3 days    Minutes of Exercise per Session: 60 min  Stress: No Stress Concern Present (08/28/2024)   [employer] of Occupational Health - Occupational Stress Questionnaire    Feeling of Stress: Not at all  Social Connections: Moderately Integrated (08/28/2024)   Social Connection and Isolation Panel    Frequency of Communication with Friends and Family: More than three times a week    Frequency of Social Gatherings with Friends and Family: More than three times a week    Attends Religious Services: More than 4 times per year    Active Member of [employer] or Organizations: No    Attends Banker Meetings: Never    Marital Status: Married  Catering Manager Violence: Not At Risk (08/28/2024)   Humiliation, Afraid, Rape, and Kick questionnaire    Fear of Current or Ex-Partner: No    Emotionally Abused: No    Physically Abused: No    Sexually Abused: No       Objective:  BP 134/88   Pulse 62   Ht 5' 3 (1.6 m)   Wt 199 lb 4.8 oz (90.4 kg)   SpO2 (!) 62%   BMI 35.30 kg/m    Physical Exam  Gen. Pleasant, well-nourished, in no distress ENT - no thrush, no pallor/icterus,no post nasal drip Neck: No JVD, no thyromegaly, no carotid bruits Lungs: no use of accessory muscles, no dullness to percussion, clear without rales or rhonchi  Cardiovascular: Rhythm regular, heart sounds  normal, no murmurs  or gallops, no peripheral edema Musculoskeletal: No deformities, no cyanosis or clubbing        Assessment & Plan:  Assessment and Plan Assessment & Plan Restless legs syndrome Symptoms of leg movement and discomfort at night, described  as pins and needles sensation. Nocturnal leg movements observed during sleep study. No evidence of sleep apnea or significant oxygen  desaturation during sleep study. Possible low iron levels contributing to symptoms. - Ordered blood test to check iron levels - If iron levels are low, will initiate iron supplementation - If iron levels are normal, will consider trial of medication for restless legs syndrome  Asthma Managed with Symbicort inhaler. Inhaler used twice daily, including at bedtime. - Continue Symbicort inhaler twice daily   Nocturnal hypoxia Check ONO on RA to r/o nocturnal hypoxia based on ophthalmologist suspicion       No follow-ups on file.   Harden ROCKFORD Jude, MD

## 2024-10-14 LAB — IRON,TIBC AND FERRITIN PANEL
Ferritin: 61 ng/mL (ref 15–150)
Iron Saturation: 19 % (ref 15–55)
Iron: 67 ug/dL (ref 27–139)
Total Iron Binding Capacity: 344 ug/dL (ref 250–450)
UIBC: 277 ug/dL (ref 118–369)

## 2024-10-16 ENCOUNTER — Ambulatory Visit (HOSPITAL_BASED_OUTPATIENT_CLINIC_OR_DEPARTMENT_OTHER): Payer: Self-pay | Admitting: Pulmonary Disease

## 2024-10-16 LAB — OPHTHALMOLOGY REPORT-SCANNED

## 2024-10-28 ENCOUNTER — Other Ambulatory Visit: Payer: Self-pay | Admitting: Family Medicine

## 2024-10-28 DIAGNOSIS — E1122 Type 2 diabetes mellitus with diabetic chronic kidney disease: Secondary | ICD-10-CM

## 2024-11-07 ENCOUNTER — Other Ambulatory Visit: Payer: Self-pay | Admitting: Cardiology

## 2024-11-08 ENCOUNTER — Telehealth (HOSPITAL_BASED_OUTPATIENT_CLINIC_OR_DEPARTMENT_OTHER): Payer: Self-pay

## 2024-11-08 NOTE — Telephone Encounter (Signed)
 Received a message that patient had not received her ONO ordered on 10/13/2024. Saw in chart where order was placed and [facility] confirmed receipt of order. Spoke with Tammy at [facility] and they have made mulitple attempts to reach out to Lauren Lopez on 11/19, 12/5 and 12/10 with no call back and they left messages each time with their contact information. I called and spoke with Lauren Lopez she states she has not gotten any calls from them and she's been watching her phone but also states maybe she missed the call and dosent know I gave her the phone number for [facility] @ [phone] to reach out to them to get this going. As [facility] states this is ready to go to her. She will reach out to them today. NFN

## 2024-11-09 LAB — OPHTHALMOLOGY REPORT-SCANNED

## 2024-11-10 ENCOUNTER — Telehealth: Payer: Self-pay | Admitting: Pulmonary Disease

## 2024-11-10 NOTE — Telephone Encounter (Signed)
 Communication  Reason for CRM: Channing with [facility] called to advise they are unable to bill the pt's insurance because the diagnosis code selected by Dr.Alva is not covered by pt's insurance and they faxed an alternative over on 11.20.25. Channing stated she is going to refax the information over today. It needs to be signed and reassigned a different dx code for billing purposes.     Not sure if you had received this yet, Dr Jude

## 2024-11-13 NOTE — Telephone Encounter (Signed)
 Have not received paperwork as of 11/13/2024, no contact information left for me to call [facility] back to tell them

## 2024-11-14 NOTE — Telephone Encounter (Signed)
 No contact information for [facility] was left can you advise number as I have not received fax

## 2024-11-16 NOTE — Telephone Encounter (Signed)
 [facility] faxed and confirmation received

## 2024-11-16 NOTE — Telephone Encounter (Signed)
 Received form awaiting sig

## 2024-11-17 LAB — OPHTHALMOLOGY REPORT-SCANNED

## 2024-11-29 ENCOUNTER — Other Ambulatory Visit: Payer: Self-pay | Admitting: Family Medicine

## 2024-11-29 DIAGNOSIS — E1122 Type 2 diabetes mellitus with diabetic chronic kidney disease: Secondary | ICD-10-CM

## 2024-12-01 ENCOUNTER — Encounter: Payer: Self-pay | Admitting: Gastroenterology

## 2024-12-05 NOTE — Progress Notes (Unsigned)
 " Cardiology Office Note:    Date:  12/06/2024   ID:  Lauren Lopez, DOB [birth year], MRN [id number]  PCP:  Katrinka Garnette KIDD, MD  Cardiologist:  Lonni LITTIE Nanas, MD  Electrophysiologist:  None   Referring MD: Katrinka Garnette KIDD, MD   Chief Complaint  Patient presents with   SVT    History of Present Illness:    Lauren Lopez is a 74 y.o. female with a hx of hypertension, hyperlipidemia, T2DM, asthma, SVT who presents for follow-up.  She was admitted with asthma exacerbation in March 2023.  Cardiology was consulted as she was having frequent episodes of SVT.  Echocardiogram 02/23/2022 showed normal biventricular function, no significant valvular disease.  Zio patch x9 days showed reported atrial flutter (23% burden) but on review I think these episodes are more likely atrial tachycardia with average rate 125 bpm.  She was seen by Dr. Waddell and felt to be likely SVT, underwent ablation 07/2022.  Zio patch x 14 days 08/2022 showed very brief runs (less than 4 seconds) of SVT.  Stress echo 12/08/2022 showed no evidence of ischemia but poor exercise capacity (4.6 METS) and frequent PVCs during exercise.  Coronary CTA 10/2023 showed normal coronary arteries, calcium  score 0.  Since last clinic visit, she reports she is doing well.  Reports occasional chest pain but very brief, just lasts for few seconds. Denies any dyspnea, lightheadedness, syncope, lower extremity edema, or palpitations.  Goes to gym 3 days per week, lifts weights and walks 1 mile.    Past Medical History:  Diagnosis Date   Acute asthma exacerbation 02/20/2022   Arthritis    right hand, back (04/03/2016)   Asthma    takes weekly allergy  shots, none in 5-6 weeks due to knee surgery   Bilateral carpal tunnel syndrome    Chronic lower back pain    GERD (gastroesophageal reflux disease)    Hyperlipidemia    Hypertension    Multiple allergies    Pneumonia 03/2012   Positive PPD    they told me it wasn't  positive; I was allergic to the test itself   Right rotator cuff tear 01/20/2013   Type II diabetes mellitus ([name])    Vasculitis 04/03/2016   Vitamin deficiency    Wears dentures    upper   Wears glasses     Past Surgical History:  Procedure Laterality Date   ABDOMINAL HYSTERECTOMY     BILATERAL SALPINGOOPHORECTOMY  11/28/2010   open laparoscopy with adhesiolysis also   CARPAL TUNNEL RELEASE  07/15/2012   Procedure: CARPAL TUNNEL RELEASE;  Surgeon: Lamar LULLA Leonor Mickey., MD;  Location: [facility];  Service: Orthopedics;  Laterality: Left;   CARPAL TUNNEL RELEASE Right 05/31/2014   Procedure: RIGHT CARPAL TUNNEL RELEASE;  Surgeon: Lamar Leonor Mickey LULLA, MD;  Location: [facility];  Service: Orthopedics;  Laterality: Right;   COLONOSCOPY     KNEE ARTHROSCOPY Bilateral 2005-2016   right-left   SHOULDER ARTHROSCOPY WITH ROTATOR CUFF REPAIR AND SUBACROMIAL DECOMPRESSION Right 01/20/2013   Procedure: RIGHT ARTHROSCOPY SHOULDER DEBRIDEMENT LIMITED, ARTHROSCOPY SHOULDER DECOMPRESSION SUBACROMIAL PARTIAL ACROMIOPLASTY WITH CORACOACROMIAL RELEASE, ROTATOR CUFF REPAIR ;  Surgeon: Fonda SHAUNNA Olmsted, MD;  Location: [facility];  Service: Orthopedics;  Laterality: Right;  RIGHT SHOULDER SCOPE DEBRIDEMENT, ACRIOMIOPLASTY, ROTATOR CUFF REPAIR   SVT ABLATION N/A 08/17/2022   Procedure: SVT ABLATION;  Surgeon: Waddell Danelle ORN, MD;  Location: [facility];  Service: Cardiovascular;  Laterality: N/A;  TOTAL KNEE ARTHROPLASTY Right 02/02/2022   Procedure: TOTAL KNEE ARTHROPLASTY;  Surgeon: Melodi Lerner, MD;  Location: [facility];  Service: Orthopedics;  Laterality: Right;   TUBAL LIGATION      Current Medications: Current Meds  Medication Sig   ADVAIR  HFA 230-21 MCG/ACT inhaler Use  2 Inhalations  2 x /day  15 minutes apart   2 x /day (every 12 hours)   blood glucose meter kit and supplies Test sugars twice daily. Dx E11.9   Cholecalciferol  (VITAMIN D3) 250 MCG  (10000 UT) capsule Take 10,000 Units by mouth daily.   docusate sodium  (COLACE) 100 MG capsule Take 100 mg by mouth daily.   EPINEPHrine  0.3 mg/0.3 mL IJ SOAJ injection Inject 0.3 mg into the muscle as needed for anaphylaxis.   fexofenadine  (ALLEGRA ) 180 MG tablet Take 1 tablet (180 mg total) by mouth daily.   guaiFENesin  (MUCINEX  PO) Take by mouth as needed.   ketorolac  (ACULAR ) 0.5 % ophthalmic solution Place 1 drop into both eyes 4 (four) times daily.   Lancets (ONETOUCH DELICA PLUS LANCET33G) MISC TEST SUGAR TWICE A DAY   Magnesium  250 MG TABS Take 250 mg by mouth in the morning and at bedtime.   metFORMIN  (GLUCOPHAGE -XR) 500 MG 24 hr tablet TAKE 1 TABLET BY MOUTH TWICE A DAY WITH MEALS FOR DIABETES   Misc Natural Products (NEURIVA PO) Take 1 tablet by mouth daily.   mometasone  (NASONEX ) 50 MCG/ACT nasal spray Place 2 sprays into the nose daily.   nitroGLYCERIN  (NITROSTAT ) 0.4 MG SL tablet Place 1 tablet (0.4 mg total) under the tongue every 5 (five) minutes as needed for chest pain.   omeprazole  (PRILOSEC) 40 MG capsule TAKE ONE CAPSULE DAILY FOR INDIGESTION AND HEARTBURN   ONETOUCH ULTRA test strip USE TO TEST SUGAR TWICE A DAY   Rhubarb (ESTROVEN MENOPAUSE RELIEF PO) Take 1 capsule by mouth daily.   rosuvastatin  (CRESTOR ) 20 MG tablet TAKE 1 TABLET BY MOUTH EVERY DAY FOR CHOLESTEROL   [DISCONTINUED] diltiazem  (CARDIZEM  CD) 300 MG 24 hr capsule Take 1 capsule (300 mg total) by mouth daily. Pt must schedule an overdue followup appt with Cardiology for any more refills. [phone] 1st attempt Thank You     Allergies:   Metronidazole and Ppd [tuberculin purified protein derivative]   Social History   Socioeconomic History   Marital status: Married    Spouse name: Not on file   Number of children: Not on file   Years of education: Not on file   Highest education level: Not on file  Occupational History   Occupation: retired  Tobacco Use   Smoking status: Never   Smokeless  tobacco: Never  Vaping Use   Vaping status: Never Used  Substance and Sexual Activity   Alcohol use: No   Drug use: No   Sexual activity: Not Currently    Birth control/protection: Post-menopausal  Other Topics Concern   Not on file  Social History Narrative   Married. 2 daughters- and husband with 7 children so 9 total- 2 grandchildren from her daughts and many more from husband- possibly 105 or more      Retired from [employer], [employer]: time at home, gardening, fishing ideally   Social Drivers of Health   Tobacco Use: Low Risk (12/06/2024)   Patient History    Smoking Tobacco Use: Never    Smokeless Tobacco Use: Never    Passive Exposure: Not on file  Financial Resource Strain: Low  Risk (08/28/2024)   Overall Financial Resource Strain (CARDIA)    Difficulty of Paying Living Expenses: Not hard at all  Food Insecurity: No Food Insecurity (08/28/2024)   Epic    Worried About Programme Researcher, Broadcasting/film/video in the Last Year: Never true    Ran Out of Food in the Last Year: Never true  Transportation Needs: No Transportation Needs (08/28/2024)   Epic    Lack of Transportation (Medical): No    Lack of Transportation (Non-Medical): No  Physical Activity: Sufficiently Active (08/28/2024)   Exercise Vital Sign    Days of Exercise per Week: 3 days    Minutes of Exercise per Session: 60 min  Stress: No Stress Concern Present (08/28/2024)   [employer] of Occupational Health - Occupational Stress Questionnaire    Feeling of Stress: Not at all  Social Connections: Moderately Integrated (08/28/2024)   Social Connection and Isolation Panel    Frequency of Communication with Friends and Family: More than three times a week    Frequency of Social Gatherings with Friends and Family: More than three times a week    Attends Religious Services: More than 4 times per year    Active Member of Clubs or Organizations: No    Attends Banker Meetings: Never    Marital  Status: Married  Depression (PHQ2-9): Low Risk (08/28/2024)   Depression (PHQ2-9)    PHQ-2 Score: 0  Alcohol Screen: Low Risk (08/28/2024)   Alcohol Screen    Last Alcohol Screening Score (AUDIT): 0  Housing: Unknown (08/28/2024)   Epic    Unable to Pay for Housing in the Last Year: No    Number of Times Moved in the Last Year: Not on file    Homeless in the Last Year: No  Utilities: Not At Risk (08/28/2024)   Epic    Threatened with loss of utilities: No  Health Literacy: Adequate Health Literacy (08/28/2024)   B1300 Health Literacy    Frequency of need for help with medical instructions: Never     Family History: The patient's family history includes Alzheimer's disease in her father; COPD in her brother; Cirrhosis in her brother; Dementia in her mother; High blood pressure in her brother; Hyperlipidemia in her father and mother; Hypertension in her brother, brother, father, and mother; Tongue cancer in her sister.  ROS:   Please see the history of present illness.     All other systems reviewed and are negative.  EKGs/Labs/Other Studies Reviewed:    The following studies were reviewed today:   EKG:   03/06/22: Atrial tachycardia with block 11/02/23: NSR, rate 73 12/06/2024: Normal sinus rhythm, rate 74, no ST abnormalities  Recent Labs: 12/13/2023: TSH 1.09 04/18/2024: Magnesium  1.9 07/25/2024: ALT 17; BUN 13; Creatinine, Ser 0.80; Hemoglobin 12.3; Platelets 266.0; Potassium 3.9; Sodium 142  Recent Lipid Panel    Component Value Date/Time   CHOL 165 12/13/2023 1545   TRIG 83 12/13/2023 1545   HDL 86 12/13/2023 1545   CHOLHDL 1.9 12/13/2023 1545   VLDL 28 07/08/2017 1515   LDLCALC 62 12/13/2023 1545    Physical Exam:    VS:  BP 131/69   Pulse 74   Ht 5' 3 (1.6 m)   Wt 195 lb (88.5 kg)   SpO2 95%   BMI 34.54 kg/m     Wt Readings from Last 3 Encounters:  12/06/24 195 lb (88.5 kg)  10/13/24 199 lb 4.8 oz (90.4 kg)  08/28/24 197 lb 12.8 oz (89.7 kg)  GEN:   Well nourished, well developed in no acute distress HEENT: Normal NECK: No JVD; No carotid bruits CARDIAC: irregular, normal rhythm, no murmurs, rubs, gallops RESPIRATORY:  Clear to auscultation without rales, wheezing or rhonchi  ABDOMEN: Soft, non-tender, non-distended MUSCULOSKELETAL:  trace edema; No deformity  SKIN: Warm and dry NEUROLOGIC:  Alert and oriented x 3 PSYCHIATRIC:  Normal affect   ASSESSMENT:    1. SVT (supraventricular tachycardia)   2. Precordial pain   3. Essential hypertension   4. Hyperlipidemia, unspecified hyperlipidemia type      PLAN:    SVT: Was having runs of narrow complex tachycardia with rate up to 180s.  Zio patch x9 days showed reported atrial flutter (23% burden) but on review I think these episodes are more likely atrial tachycardia with average rate 125 bpm.  She was seen by Dr. Waddell and felt to be likely SVT, underwent ablation 07/2022.  Zio patch x 14 days 08/2022 showed very brief runs (less than 4 seconds) of SVT.    Chest pain: Stress echo 12/08/2022 showed no evidence of ischemia but poor exercise capacity (4.6 METS) and frequent PVCs during exercise.  Coronary CTA 10/2023 showed normal coronary arteries, calcium  score 0.  Hypertension: Continue diltiazem  300 mg daily.  Appears controlled  Hyperlipidemia: Continue rosuvastatin  20 mg daily.  LDL 62 on 12/2023  Daytime somnolence: Sleep study 06/2022 showed no significant OSA  RTC in 1 year   Medication Adjustments/Labs and Tests Ordered: Current medicines are reviewed at length with the patient today.  Concerns regarding medicines are outlined above.  Orders Placed This Encounter  Procedures   EKG 12-Lead   Meds ordered this encounter  Medications   diltiazem  (CARDIZEM  CD) 300 MG 24 hr capsule    Sig: Take 1 capsule (300 mg total) by mouth daily.    Dispense:  90 capsule    Refill:  3    Patient Instructions  Medication Instructions: Your physician recommends that you continue  on your current medications as directed. Please refer to the Current Medication list given to you today.  *If you need a refill on your cardiac medications before your next appointment, please call your pharmacy*  Lab Work: none If you have labs (blood work) drawn today and your tests are completely normal, you will receive your results only by: MyChart Message (if you have MyChart) OR A paper copy in the mail If you have any lab test that is abnormal or we need to change your treatment, we will call you to review the results.  Testing/Procedures: none  Follow-Up: At [facility], you and your health needs are our priority.  As part of our continuing mission to provide you with exceptional heart care, our providers are all part of one team.  This team includes your primary Cardiologist (physician) and Advanced Practice Providers or APPs (Physician Assistants and Nurse Practitioners) who all work together to provide you with the care you need, when you need it.  Your next appointment:   1 year  Provider:   Dr. Kate  We recommend signing up for the patient portal called MyChart.  Sign up information is provided on this After Visit Summary.  MyChart is used to connect with patients for Virtual Visits (Telemedicine).  Patients are able to view lab/test results, encounter notes, upcoming appointments, etc.  Non-urgent messages can be sent to your provider as well.   To learn more about what you can do with MyChart, go to [email or web address].  Other Instructions none           Signed, Lonni LITTIE Nanas, MD  12/06/2024 5:10 PM    [facility] "

## 2024-12-06 ENCOUNTER — Encounter: Payer: Self-pay | Admitting: Cardiology

## 2024-12-06 ENCOUNTER — Other Ambulatory Visit: Payer: Self-pay | Admitting: Cardiology

## 2024-12-06 ENCOUNTER — Ambulatory Visit: Admitting: Cardiology

## 2024-12-06 VITALS — BP 131/69 | HR 74 | Ht 63.0 in | Wt 195.0 lb

## 2024-12-06 DIAGNOSIS — E785 Hyperlipidemia, unspecified: Secondary | ICD-10-CM

## 2024-12-06 DIAGNOSIS — R072 Precordial pain: Secondary | ICD-10-CM | POA: Diagnosis not present

## 2024-12-06 DIAGNOSIS — I10 Essential (primary) hypertension: Secondary | ICD-10-CM

## 2024-12-06 DIAGNOSIS — I471 Supraventricular tachycardia, unspecified: Secondary | ICD-10-CM | POA: Diagnosis not present

## 2024-12-06 MED ORDER — DILTIAZEM HCL ER COATED BEADS 300 MG PO CP24: 300.0000 mg | ORAL_CAPSULE | Freq: Every day | ORAL | 3 refills | Status: DC

## 2024-12-06 NOTE — Telephone Encounter (Signed)
 Pending-pt has appt this afternoon w Kate

## 2024-12-06 NOTE — Patient Instructions (Signed)
 Medication Instructions:  Your physician recommends that you continue on your current medications as directed. Please refer to the Current Medication list given to you today.  *If you need a refill on your cardiac medications before your next appointment, please call your pharmacy*  Lab Work: none If you have labs (blood work) drawn today and your tests are completely normal, you will receive your results only by: MyChart Message (if you have MyChart) OR A paper copy in the mail If you have any lab test that is abnormal or we need to change your treatment, we will call you to review the results.  Testing/Procedures: none  Follow-Up: At [facility] you and your health needs are our priority.  As part of our continuing mission to provide you with exceptional heart care, our providers are all part of one team.  This team includes your primary Cardiologist (physician) and Advanced Practice Providers or APPs (Physician Assistants and Nurse Practitioners) who all work together to provide you with the care you need, when you need it.  Your next appointment:   1 year  Provider:   Dr. Kate  We recommend signing up for the patient portal called MyChart.  Sign up information is provided on this After Visit Summary.  MyChart is used to connect with patients for Virtual Visits (Telemedicine).  Patients are able to view lab/test results, encounter notes, upcoming appointments, etc.  Non-urgent messages can be sent to your provider as well.   To learn more about what you can do with MyChart, go to [email or web address].   Other Instructions none

## 2024-12-12 ENCOUNTER — Encounter: Payer: Self-pay | Admitting: Family Medicine

## 2024-12-12 ENCOUNTER — Ambulatory Visit: Payer: Self-pay | Admitting: Family Medicine

## 2024-12-12 ENCOUNTER — Ambulatory Visit: Admitting: Family Medicine

## 2024-12-12 VITALS — BP 128/68 | HR 70 | Temp 97.7°F | Ht 63.0 in | Wt 197.2 lb

## 2024-12-12 DIAGNOSIS — I10 Essential (primary) hypertension: Secondary | ICD-10-CM

## 2024-12-12 DIAGNOSIS — E559 Vitamin D deficiency, unspecified: Secondary | ICD-10-CM

## 2024-12-12 DIAGNOSIS — Z7984 Long term (current) use of oral hypoglycemic drugs: Secondary | ICD-10-CM

## 2024-12-12 DIAGNOSIS — Z79899 Other long term (current) drug therapy: Secondary | ICD-10-CM

## 2024-12-12 DIAGNOSIS — E785 Hyperlipidemia, unspecified: Secondary | ICD-10-CM

## 2024-12-12 DIAGNOSIS — E1169 Type 2 diabetes mellitus with other specified complication: Secondary | ICD-10-CM

## 2024-12-12 DIAGNOSIS — N182 Chronic kidney disease, stage 2 (mild): Secondary | ICD-10-CM | POA: Diagnosis not present

## 2024-12-12 DIAGNOSIS — E1122 Type 2 diabetes mellitus with diabetic chronic kidney disease: Secondary | ICD-10-CM | POA: Diagnosis not present

## 2024-12-12 DIAGNOSIS — Z0000 Encounter for general adult medical examination without abnormal findings: Secondary | ICD-10-CM

## 2024-12-12 LAB — MICROALBUMIN / CREATININE URINE RATIO
Creatinine,U: 31.8 mg/dL
Microalb Creat Ratio: UNDETERMINED mg/g (ref 0.0–30.0)
Microalb, Ur: <0.7 mg/dL

## 2024-12-12 LAB — TSH: TSH: 1.22 u[IU]/mL (ref 0.35–5.50)

## 2024-12-12 LAB — CBC WITH DIFFERENTIAL/PLATELET
Basophils Absolute: 0 K/uL (ref 0.0–0.1)
Basophils Relative: 0.4 % (ref 0.0–3.0)
Eosinophils Absolute: 0.1 K/uL (ref 0.0–0.7)
Eosinophils Relative: 1.7 % (ref 0.0–5.0)
HCT: 37.2 % (ref 36.0–46.0)
Hemoglobin: 12 g/dL (ref 12.0–15.0)
Lymphocytes Relative: 48.3 % — ABNORMAL HIGH (ref 12.0–46.0)
Lymphs Abs: 2.6 K/uL (ref 0.7–4.0)
MCHC: 32.2 g/dL (ref 30.0–36.0)
MCV: 86 fl (ref 78.0–100.0)
Monocytes Absolute: 0.3 K/uL (ref 0.1–1.0)
Monocytes Relative: 5.3 % (ref 3.0–12.0)
Neutro Abs: 2.4 K/uL (ref 1.4–7.7)
Neutrophils Relative %: 44.3 % (ref 43.0–77.0)
Platelets: 243 K/uL (ref 150.0–400.0)
RBC: 4.33 Mil/uL (ref 3.87–5.11)
RDW: 16 % — ABNORMAL HIGH (ref 11.5–15.5)
WBC: 5.4 K/uL (ref 4.0–10.5)

## 2024-12-12 LAB — LIPID PANEL
Cholesterol: 158 mg/dL (ref 28–200)
HDL: 90.6 mg/dL
LDL Cholesterol: 58 mg/dL (ref 10–99)
NonHDL: 67.27
Total CHOL/HDL Ratio: 2
Triglycerides: 44 mg/dL (ref 10.0–149.0)
VLDL: 8.8 mg/dL (ref 0.0–40.0)

## 2024-12-12 LAB — VITAMIN D 25 HYDROXY (VIT D DEFICIENCY, FRACTURES): VITD: 67.16 ng/mL (ref 30.00–100.00)

## 2024-12-12 LAB — HEMOGLOBIN A1C: Hgb A1c MFr Bld: 6.5 % (ref 4.6–6.5)

## 2024-12-12 NOTE — Patient Instructions (Addendum)
 Restart at [facility]) [employer] [employer] 3 days a week  Great job getting under 100- your old doctor would be proud!   Please stop by lab before you go If you have mychart- we will send your results within 3 business days of us  receiving them.  If you do not have mychart- we will call you about results within 5 business days of us  receiving them.  *please also note that you will see labs on mychart as soon as they post. I will later go in and write notes on them- will say notes from Dr. Katrinka   Recommended follow up: Return in about 4 months (around 04/11/2025) for followup or sooner if needed.Schedule b4 you leave.

## 2024-12-12 NOTE — Telephone Encounter (Signed)
 Spoke with [facility] they are going to get report from [facility] as they have not sent it to us  yet. She will have them fax to our office once reviewed by provider we will reach out to pt and let her know results

## 2024-12-12 NOTE — Progress Notes (Signed)
 " Phone [phone]   Subjective:  Patient presents today for their annual physical. Chief complaint-noted.   See problem oriented charting- ROS- full  review of systems was completed and negative except for topics noted under acute/chronic concerns  The following were reviewed and entered/updated in epic: Past Medical History:  Diagnosis Date   Acute asthma exacerbation 02/20/2022   Arthritis    right hand, back (04/03/2016)   Asthma    takes weekly allergy  shots, none in 5-6 weeks due to knee surgery   Bilateral carpal tunnel syndrome    Chronic lower back pain    GERD (gastroesophageal reflux disease)    Hyperlipidemia    Hypertension    Multiple allergies    Pneumonia 03/2012   Positive PPD    they told me it wasn't positive; I was allergic to the test itself   Right rotator cuff tear 01/20/2013   Type II diabetes mellitus ([name])    Vasculitis 04/03/2016   Vitamin deficiency    Wears dentures    upper   Wears glasses    Patient Active Problem List   Diagnosis Date Noted   Prolonged QT interval 02/20/2022    Priority: High   Type 2 diabetes mellitus with stage 2 chronic kidney disease, without long-term current use of insulin  ([name]) 04/30/2020    Priority: High   Diabetic retinopathy ([name]) 04/18/2024    Priority: Medium    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 02/20/2022 03/03/2022    Priority: Medium    Hepatic steatosis 03/03/2022    Priority: Medium    SVT (supraventricular tachycardia)     Priority: Medium    Allergic rhinitis due to pollen 04/03/2021    Priority: Medium    Hyperlipidemia associated with type 2 diabetes mellitus ([name]) 04/30/2020    Priority: Medium    Osteoporosis 06/01/2017    Priority: Medium    GERD (gastroesophageal reflux disease)     Priority: Medium    Vitamin D  deficiency     Priority: Medium    Asthma     Priority: Medium    Essential hypertension 08/09/2009    Priority: Medium    Lipoma of right upper extremity  05/01/2020    Priority: Low   Anterolisthesis of lumbar spine 12/21/2023    Priority: 1.   Arthralgia of hip 02/01/2023    Priority: 1.   S/P knee replacement 02/20/2022    Priority: 1.   S/P total knee arthroplasty, right 02/02/2022    Priority: 1.   Past Surgical History:  Procedure Laterality Date   ABDOMINAL HYSTERECTOMY     BILATERAL SALPINGOOPHORECTOMY  11/28/2010   open laparoscopy with adhesiolysis also   CARPAL TUNNEL RELEASE  07/15/2012   Procedure: CARPAL TUNNEL RELEASE;  Surgeon: Lamar LULLA Leonor Mickey., MD;  Location: [facility];  Service: Orthopedics;  Laterality: Left;   CARPAL TUNNEL RELEASE Right 05/31/2014   Procedure: RIGHT CARPAL TUNNEL RELEASE;  Surgeon: Lamar Leonor Mickey LULLA, MD;  Location: [facility];  Service: Orthopedics;  Laterality: Right;   COLONOSCOPY     KNEE ARTHROSCOPY Bilateral 2005-2016   right-left   SHOULDER ARTHROSCOPY WITH ROTATOR CUFF REPAIR AND SUBACROMIAL DECOMPRESSION Right 01/20/2013   Procedure: RIGHT ARTHROSCOPY SHOULDER DEBRIDEMENT LIMITED, ARTHROSCOPY SHOULDER DECOMPRESSION SUBACROMIAL PARTIAL ACROMIOPLASTY WITH CORACOACROMIAL RELEASE, ROTATOR CUFF REPAIR ;  Surgeon: Fonda SHAUNNA Olmsted, MD;  Location: [facility];  Service: Orthopedics;  Laterality: Right;  RIGHT SHOULDER SCOPE DEBRIDEMENT, ACRIOMIOPLASTY, ROTATOR CUFF REPAIR  SVT ABLATION N/A 08/17/2022   Procedure: SVT ABLATION;  Surgeon: Waddell Danelle ORN, MD;  Location: [facility];  Service: Cardiovascular;  Laterality: N/A;   TOTAL KNEE ARTHROPLASTY Right 02/02/2022   Procedure: TOTAL KNEE ARTHROPLASTY;  Surgeon: Melodi Lerner, MD;  Location: [facility];  Service: Orthopedics;  Laterality: Right;   TUBAL LIGATION      Family History  Problem Relation Age of Onset   Hypertension Mother    Dementia Mother    Hyperlipidemia Mother    Hypertension Father    Alzheimer's disease Father    Hyperlipidemia Father    Tongue cancer Sister        smoker    Cirrhosis Brother    Hypertension Brother    COPD Brother        smoker   Hypertension Brother    High blood pressure Brother     Medications- reviewed and updated Current Outpatient Medications  Medication Sig Dispense Refill   ADVAIR  HFA 230-21 MCG/ACT inhaler Use  2 Inhalations  2 x /day  15 minutes apart   2 x /day (every 12 hours) 12 each 2   blood glucose meter kit and supplies Test sugars twice daily. Dx E11.9 1 each 0   Cholecalciferol  (VITAMIN D3) 250 MCG (10000 UT) capsule Take 10,000 Units by mouth daily.     diltiazem  (CARDIZEM  CD) 300 MG 24 hr capsule Take 1 capsule (300 mg total) by mouth daily. 90 capsule 3   docusate sodium  (COLACE) 100 MG capsule Take 100 mg by mouth daily.     EPINEPHrine  0.3 mg/0.3 mL IJ SOAJ injection Inject 0.3 mg into the muscle as needed for anaphylaxis.     fexofenadine  (ALLEGRA ) 180 MG tablet Take 1 tablet (180 mg total) by mouth daily. 90 tablet 6   guaiFENesin  (MUCINEX  PO) Take by mouth as needed.     Lancets (ONETOUCH DELICA PLUS LANCET33G) MISC TEST SUGAR TWICE A DAY 200 each 1   Magnesium  250 MG TABS Take 250 mg by mouth in the morning and at bedtime.     metFORMIN  (GLUCOPHAGE -XR) 500 MG 24 hr tablet TAKE 1 TABLET BY MOUTH TWICE A DAY WITH MEALS FOR DIABETES 180 tablet 0   Misc Natural Products (NEURIVA PO) Take 1 tablet by mouth daily.     mometasone  (NASONEX ) 50 MCG/ACT nasal spray Place 2 sprays into the nose daily. 51 each 1   nitroGLYCERIN  (NITROSTAT ) 0.4 MG SL tablet Place 1 tablet (0.4 mg total) under the tongue every 5 (five) minutes as needed for chest pain. 25 tablet 3   omeprazole  (PRILOSEC) 40 MG capsule TAKE ONE CAPSULE DAILY FOR INDIGESTION AND HEARTBURN 90 capsule 2   ONETOUCH ULTRA test strip USE TO TEST SUGAR TWICE A DAY 100 strip 3   Rhubarb (ESTROVEN MENOPAUSE RELIEF PO) Take 1 capsule by mouth daily.     rosuvastatin  (CRESTOR ) 20 MG tablet TAKE 1 TABLET BY MOUTH EVERY DAY FOR CHOLESTEROL 90 tablet 2   ketorolac   (ACULAR ) 0.5 % ophthalmic solution Place 1 drop into both eyes 4 (four) times daily. (Patient not taking: Reported on 12/12/2024)     No current facility-administered medications for this visit.    Allergies-reviewed and updated Allergies[1]  Social History   Social History Narrative   Married. 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 Oropharynx normal Neck: no thyromegaly CV: RRR no murmurs rubs or gallops Lungs: CTAB no crackles, wheeze, rhonchi Abdomen: soft/nontender/nondistended/normal bowel sounds. No rebound or guarding.  Ext: no edema Skin: warm, dry Neuro: grossly normal, moves all extremities, PERRLA  Diabetic foot exam was performed with the following findings:   No deformities, ulcerations, or other skin breakdown Normal sensation of 10g monofilament Intact posterior tibialis and dorsalis pedis pulses      Assessment and Plan   74 y.o. female presenting for annual physical.  Health Maintenance counseling: 1. Anticipatory guidance: Patient counseled regarding regular dental exams -q6 months, eye exams - yearly at least if not more,  avoiding smoking and second hand smoke , limiting alcohol to 1 beverage per day- none , no illicit drugs .   2. Risk factor reduction:  Advised patient of need for regular exercise and diet rich and fruits and vegetables to reduce risk of heart attack and stroke.  Exercise- still going to [facility] - had been 3 days a week currently missing a couple of  weeks and planning to restart.  Diet/weight management-down a few lbs even through the holidays.  Wt Readings from Last 3 Encounters:  12/12/24 197 lb 3.2 oz (89.4 kg)  12/06/24 195 lb (88.5 kg)  10/13/24 199 lb 4.8 oz (90.4 kg)  3. Immunizations/screenings/ancillary studies-may update COVID shot Immunization History  Administered Date(s) Administered    sv, Bivalent, Protein Subunit Rsvpref,pf (Abrysvo) 12/01/2022   Fluad Trivalent(High Dose 65+) 11/10/2024   INFLUENZA, HIGH DOSE SEASONAL PF 11/10/2017, 09/15/2018, 09/04/2019, 04/26/2020, 10/14/2021, 10/27/2021, 09/02/2022, 08/23/2023   Influenza, Seasonal, Injecte, Preservative Fre 10/17/2015   Influenza,inj,Quad PF,6+ Mos 10/30/2022, 11/29/2023   Influenza,inj,quad, With Preservative 10/15/2016   Influenza-Unspecified 08/30/2020   PFIZER(Purple Top)SARS-COV-2 Vaccination 12/22/2019, 01/12/2020, 04/26/2020, 04/01/2021   Pfizer Covid-19 Vaccine Bivalent Booster 59yrs & up 08/20/2023   Pneumococcal Conjugate-13 12/22/2018   Pneumococcal Polysaccharide-23 02/04/2017, 04/25/2021, 10/27/2021, 10/30/2022, 11/29/2023   Pneumococcal-Unspecified 06/27/2013   Tdap 04/23/2012   Zoster Recombinant(Shingrix) 12/14/2021, 10/06/2022   Zoster, Live 07/31/2015   4. Cervical cancer screening- -past formal age based screening recommendations- does not see GYN- no vaginal bleeding or discharge- no history of abnormal pap smear 5. Breast cancer screening-  breast exam - self exams- and mammogram 08/22/2024 6. Colon cancer screening - we are trying to get her scheduled but we have not not been able to get records from Dr. Luis which are needed to schedule her-she was finally able to get scheduled 2 weeks from now thankfully from visit- still trouble with records 7. Skin cancer screening- lower risk due to melanin content . advised regular sunscreen use. Denies worrisome, changing, or new skin lesions.  8. Birth control/STD check- only active with husband  and postmenopausal 9. Osteoporosis screening at 65-see below 10. Smoking associated screening -never smoker  Status of chronic or acute concerns   #overnight oxygen  study- has not heard back- we are trying to get results for patient- reached out to pulmonary - optho had been concerned  # Diabetes S: Medication:Metformin  500 mg twice a day extended release- lose  stools on 3x a day in past -Prior PCP encouraged Mounjaro  -frequent urination 3-4x a night even with normal a1c  Exercise and diet- working on weight loss Lab Results  Component Value Date   HGBA1C 7.1 (H) 07/25/2024   HGBA1C 7.0 (H) 04/18/2024   HGBA1C 6.6 (H) 12/13/2023   A/P: has worked on weight loss- we are hoping has improved -sees Dr. Elner for diabetes retinopathy and injections every 4 weeks at least- new injection starting tomorrow  #SVT- rates up to 180's and also atrial tachycardia at times- ablation was 07/2022 for SVT. Very brief SVT afterwards. Has had reassuring coronary CT angiogram 10/2023 with normal coronary arteries and calicum 0.   -prior sleep study without OSA   #hypertension S: medication: Diltiazem  300 mg daily extended release A/P: stable- continue current medicines   #hyperlipidemia with aortic arch atherosclerosis  S: Medication:Rosuvastatin  20 mg daily -CT coronary morphology with calcium  score of 0 on 11/16/2023 and no evidence of CAD  Lab Results  Component Value Date   CHOL 165 12/13/2023   HDL 86 12/13/2023   LDLCALC 62 12/13/2023   TRIG 83 12/13/2023   CHOLHDL 1.9 12/13/2023  A/P: lipids at goal last year- update today     # Asthma/allergies- on allergy  shots with allergist  S: Maintenance Medication: Advair  230-21 mcg 2 puffs twice daily -Also uses Allegra  and Nasonex  for allergiesneeds Mucinex  at times -albuterol  if needed A/P: reasonable control lately- continue to monitor and continue current medications    # GERD S:Medication: Omeprazole  40 mg- some breakthrough  A/P: in  general controlled but with rare breakthrough hold off on going down   #Vitamin D  deficiency S: Medication: Prior PCP recommended 10,000 units a day- still on this Last vitamin D  Lab Results  Component Value Date   VD25OH 38 12/13/2023  A/P: hopefully stable- update vitamin D  today. Continue current meds for now  - she's required very high dose   # Fatty liver-monitor LFTs at least annually and focus on healthy eating/regular exercise-she's gotten under 200- congratulated!   #osteoporosis  - was on fosamax  06/2017-12/2019. Thankfully no reflux -improved September 2024 exam- recheck September 2026- due next visitj  #small thyroid  nodules- check TSH yearly - lats ultrasound 2021 with no repeat needed.  Lab Results  Component Value Date   TSH 1.09 12/13/2023   #low back pain- has seen orthopedic in past and told may eventually need surgery and she would know when she was ready- she continues to have some pain in low back but tolerable.   Recommended follow up: Return in about 4 months (around 04/11/2025) for followup or sooner if needed.Schedule b4 you leave. Future Appointments  Date Time Provider Department Center  12/28/2024 11:00 AM Mollie Nestor [facility]STE[facility]ON[facility]  09/04/2025  1:40 PM [facility] ANNUAL WELLNESS VISIT 1 [facility]   Lab/Order associations: fasting   ICD-10-CM   1. Preventative health care  Z00.00     2. Hyperlipidemia associated with type 2 diabetes mellitus ([name])  E11.69    E78.5     3. Vitamin D  deficiency  E55.9     4. Medication management  Z79.899     5. Type 2 diabetes mellitus with stage 2 chronic kidney disease, without long-term current use of insulin  ([name])  E11.22    N18.2     6. Essential hypertension  I10       No orders of the defined types were placed in this encounter.   Return precautions advised.  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

## 2024-12-12 NOTE — Telephone Encounter (Signed)
 Hello everyone- I'm seeing patient today- she states she has been billed for this service but has yet to receive the results- I was wondering if someone could update her with results and the plan from results (I'm also having hard time finding)

## 2024-12-13 LAB — COMPLETE METABOLIC PANEL WITHOUT GFR
AG Ratio: 1.5 (calc) (ref 1.0–2.5)
ALT: 17 U/L (ref 6–29)
AST: 21 U/L (ref 10–35)
Albumin: 4.3 g/dL (ref 3.6–5.1)
Alkaline phosphatase (APISO): 106 U/L (ref 37–153)
BUN: 10 mg/dL (ref 7–25)
CO2: 24 mmol/L (ref 20–32)
Calcium: 9.5 mg/dL (ref 8.6–10.4)
Chloride: 105 mmol/L (ref 98–110)
Creat: 0.62 mg/dL (ref 0.60–1.00)
Globulin: 2.8 g/dL (ref 1.9–3.7)
Glucose, Bld: 97 mg/dL (ref 65–99)
Potassium: 4.1 mmol/L (ref 3.5–5.3)
Sodium: 137 mmol/L (ref 135–146)
Total Bilirubin: 0.6 mg/dL (ref 0.2–1.2)
Total Protein: 7.1 g/dL (ref 6.1–8.1)

## 2024-12-13 LAB — OPHTHALMOLOGY REPORT-SCANNED

## 2024-12-14 ENCOUNTER — Ambulatory Visit: Payer: Self-pay | Admitting: Pulmonary Disease

## 2024-12-14 LAB — OPHTHALMOLOGY REPORT-SCANNED

## 2024-12-14 NOTE — Telephone Encounter (Signed)
 ONO onRA - 3h of recording - no significant desaturations

## 2024-12-14 NOTE — Telephone Encounter (Signed)
 Results are in chart, left pt message to notify of normal results

## 2024-12-14 NOTE — Progress Notes (Signed)
 Left pt message to notify of normal results

## 2024-12-25 ENCOUNTER — Ambulatory Visit: Payer: Medicare HMO | Admitting: Internal Medicine

## 2024-12-25 ENCOUNTER — Telehealth: Payer: Self-pay | Admitting: Family Medicine

## 2024-12-25 DIAGNOSIS — E1122 Type 2 diabetes mellitus with diabetic chronic kidney disease: Secondary | ICD-10-CM

## 2024-12-25 MED ORDER — TRUE METRIX AIR GLUCOSE METER W/DEVICE KIT: PACK | 0 refills | Status: AC

## 2024-12-25 MED ORDER — TRUE METRIX BLOOD GLUCOSE TEST VI STRP: ORAL_STRIP | 12 refills | Status: AC

## 2024-12-25 NOTE — Telephone Encounter (Signed)
 Ordered new glucose meter and strips due to change in coverage.

## 2024-12-25 NOTE — Telephone Encounter (Signed)
 Prescription Request  12/25/2024  LOV: 12/12/2024  What is the name of the medication or equipment? True Metrix Air glucose meter and test strip   Have you contacted your pharmacy to request a refill? Yes   Which pharmacy would you like this sent to?  [facility]  Phone: [phone]  Fax: [phone]     Patient notified that their request is being sent to the clinical staff for review and that they should receive a response within 2 business days.

## 2024-12-26 NOTE — Progress Notes (Unsigned)
 "  Chief Complaint: Primary GI MD:  HPI:  *** is a  ***  who was referred to me by Katrinka Garnette KIDD, MD for a complaint of *** .     Discussed the use of AI scribe software for clinical note transcription with the patient, who gave verbal consent to proceed.  History of Present Illness      PREVIOUS GI WORKUP   colonoscopy done in 08/2018 by Dr. Luis which showed 6 colonic polyps which were adenomatous. Patient reports family history of polyps in her father.   COLONOSCOPY 1999, 2004, 2009, 2014, 2019  ESOPHAGOGASTRODUODENOSCOPY 1999, 2002, 2005, 2009, 2014    Past Medical History:  Diagnosis Date   Acute asthma exacerbation 02/20/2022   Arthritis    right hand, back (04/03/2016)   Asthma    takes weekly allergy  shots, none in 5-6 weeks due to knee surgery   Bilateral carpal tunnel syndrome    Chronic lower back pain    GERD (gastroesophageal reflux disease)    Hyperlipidemia    Hypertension    Multiple allergies    Pneumonia 03/2012   Positive PPD    they told me it wasn't positive; I was allergic to the test itself   Right rotator cuff tear 01/20/2013   Type II diabetes mellitus ([name])    Vasculitis 04/03/2016   Vitamin deficiency    Wears dentures    upper   Wears glasses     Past Surgical History:  Procedure Laterality Date   ABDOMINAL HYSTERECTOMY     BILATERAL SALPINGOOPHORECTOMY  11/28/2010   open laparoscopy with adhesiolysis also   CARPAL TUNNEL RELEASE  07/15/2012   Procedure: CARPAL TUNNEL RELEASE;  Surgeon: Lamar LULLA Leonor Mickey., MD;  Location: [facility];  Service: Orthopedics;  Laterality: Left;   CARPAL TUNNEL RELEASE Right 05/31/2014   Procedure: RIGHT CARPAL TUNNEL RELEASE;  Surgeon: Lamar Leonor Mickey LULLA, MD;  Location: [facility];  Service: Orthopedics;  Laterality: Right;   COLONOSCOPY     KNEE ARTHROSCOPY Bilateral 2005-2016   right-left   SHOULDER ARTHROSCOPY WITH ROTATOR CUFF REPAIR AND SUBACROMIAL  DECOMPRESSION Right 01/20/2013   Procedure: RIGHT ARTHROSCOPY SHOULDER DEBRIDEMENT LIMITED, ARTHROSCOPY SHOULDER DECOMPRESSION SUBACROMIAL PARTIAL ACROMIOPLASTY WITH CORACOACROMIAL RELEASE, ROTATOR CUFF REPAIR ;  Surgeon: Fonda SHAUNNA Olmsted, MD;  Location: [facility];  Service: Orthopedics;  Laterality: Right;  RIGHT SHOULDER SCOPE DEBRIDEMENT, ACRIOMIOPLASTY, ROTATOR CUFF REPAIR   SVT ABLATION N/A 08/17/2022   Procedure: SVT ABLATION;  Surgeon: Waddell Danelle ORN, MD;  Location: [facility];  Service: Cardiovascular;  Laterality: N/A;   TOTAL KNEE ARTHROPLASTY Right 02/02/2022   Procedure: TOTAL KNEE ARTHROPLASTY;  Surgeon: Melodi Lerner, MD;  Location: [facility];  Service: Orthopedics;  Laterality: Right;   TUBAL LIGATION      Current Outpatient Medications  Medication Sig Dispense Refill   ADVAIR  HFA 230-21 MCG/ACT inhaler Use  2 Inhalations  2 x /day  15 minutes apart   2 x /day (every 12 hours) 12 each 2   blood glucose meter kit and supplies Test sugars twice daily. Dx E11.9 1 each 0   Blood Glucose Monitoring Suppl (TRUE METRIX AIR GLUCOSE METER) w/Device KIT Test sugars twice daily 1 kit 0   Cholecalciferol  (VITAMIN D3) 250 MCG (10000 UT) capsule Take 10,000 Units by mouth daily.     diltiazem  (CARDIZEM  CD) 300 MG 24 hr capsule Take 1 capsule (300 mg total) by mouth daily. 90 capsule 3  docusate sodium  (COLACE) 100 MG capsule Take 100 mg by mouth daily.     EPINEPHrine  0.3 mg/0.3 mL IJ SOAJ injection Inject 0.3 mg into the muscle as needed for anaphylaxis.     fexofenadine  (ALLEGRA ) 180 MG tablet Take 1 tablet (180 mg total) by mouth daily. 90 tablet 6   glucose blood (TRUE METRIX BLOOD GLUCOSE TEST) test strip Use as instructed 100 each 12   guaiFENesin  (MUCINEX  PO) Take by mouth as needed.     ketorolac  (ACULAR ) 0.5 % ophthalmic solution Place 1 drop into both eyes 4 (four) times daily. (Patient not taking: Reported on 12/12/2024)     Lancets (ONETOUCH DELICA PLUS LANCET33G)  MISC TEST SUGAR TWICE A DAY 200 each 1   Magnesium  250 MG TABS Take 250 mg by mouth in the morning and at bedtime.     metFORMIN  (GLUCOPHAGE -XR) 500 MG 24 hr tablet TAKE 1 TABLET BY MOUTH TWICE A DAY WITH MEALS FOR DIABETES 180 tablet 0   Misc Natural Products (NEURIVA PO) Take 1 tablet by mouth daily.     mometasone  (NASONEX ) 50 MCG/ACT nasal spray Place 2 sprays into the nose daily. 51 each 1   nitroGLYCERIN  (NITROSTAT ) 0.4 MG SL tablet Place 1 tablet (0.4 mg total) under the tongue every 5 (five) minutes as needed for chest pain. 25 tablet 3   omeprazole  (PRILOSEC) 40 MG capsule TAKE ONE CAPSULE DAILY FOR INDIGESTION AND HEARTBURN 90 capsule 2   Rhubarb (ESTROVEN MENOPAUSE RELIEF PO) Take 1 capsule by mouth daily.     rosuvastatin  (CRESTOR ) 20 MG tablet TAKE 1 TABLET BY MOUTH EVERY DAY FOR CHOLESTEROL 90 tablet 2   No current facility-administered medications for this visit.    Allergies as of 12/28/2024 - Review Complete 12/12/2024  Allergen Reaction Noted   Metronidazole Swelling    Ppd [tuberculin purified protein derivative] Other (See Comments) 10/15/2013    Family History  Problem Relation Age of Onset   Hypertension Mother    Dementia Mother    Hyperlipidemia Mother    Hypertension Father    Alzheimer's disease Father    Hyperlipidemia Father    Tongue cancer Sister        smoker   Cirrhosis Brother    Hypertension Brother    COPD Brother        smoker   Hypertension Brother    High blood pressure Brother     Social History   Socioeconomic History   Marital status: Married    Spouse name: Not on file   Number of children: Not on file   Years of education: Not on file   Highest education level: Not on file  Occupational History   Occupation: retired  Tobacco Use   Smoking status: Never   Smokeless tobacco: Never  Vaping Use   Vaping status: Never Used  Substance and Sexual Activity   Alcohol use: No   Drug use: No   Sexual activity: Not Currently     Birth control/protection: Post-menopausal  Other Topics Concern   Not on file  Social History Narrative   Married. 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, gardening, fishing ideally   Social Drivers of Health   Tobacco Use: Low Risk (12/12/2024)   Patient History    Smoking Tobacco  Use: Never    Smokeless Tobacco Use: Never    Passive Exposure: Not on file  Financial Resource Strain: Low Risk (08/28/2024)   Overall Financial Resource Strain (CARDIA)    Difficulty of Paying Living Expenses: Not hard at all  Food Insecurity: No Food Insecurity (08/28/2024)   Epic    Worried About Programme Researcher, Broadcasting/film/video in the Last Year: Never true    Ran Out of Food in the Last Year: Never true  Transportation Needs: No Transportation Needs (08/28/2024)   Epic    Lack of Transportation (Medical): No    Lack of Transportation (Non-Medical): No  Physical Activity: Sufficiently Active (08/28/2024)   Exercise Vital Sign    Days of Exercise per Week: 3 days    Minutes of Exercise per Session: 60 min  Stress: No Stress Concern Present (08/28/2024)   [employer] of Occupational Health - Occupational Stress Questionnaire    Feeling of Stress: Not at all  Social Connections: Moderately Integrated (08/28/2024)   Social Connection and Isolation Panel    Frequency of Communication with Friends and Family: More than three times a week    Frequency of Social Gatherings with Friends and Family: More than three times a week    Attends Religious Services: More than 4 times per year    Active Member of Clubs or Organizations: No    Attends Banker Meetings: Never    Marital Status: Married  Catering Manager Violence: Not At Risk (08/28/2024)   Epic    Fear of Current or Ex-Partner: No    Emotionally Abused: No    Physically  Abused: No    Sexually Abused: No  Depression (PHQ2-9): Low Risk (12/12/2024)   Depression (PHQ2-9)    PHQ-2 Score: 1  Alcohol Screen: Low Risk (08/28/2024)   Alcohol Screen    Last Alcohol Screening Score (AUDIT): 0  Housing: Unknown (08/28/2024)   Epic    Unable to Pay for Housing in the Last Year: No    Number of Times Moved in the Last Year: Not on file    Homeless in the Last Year: No  Utilities: Not At Risk (08/28/2024)   Epic    Threatened with loss of utilities: No  Health Literacy: Adequate Health Literacy (08/28/2024)   B1300 Health Literacy    Frequency of need for help with medical instructions: Never    Review of Systems:    Constitutional: No weight loss, fever, chills, weakness or fatigue HEENT: Eyes: No change in vision               Ears, Nose, Throat:  No change in hearing or congestion Skin: No rash or itching Cardiovascular: No chest pain, chest pressure or palpitations   Respiratory: No SOB or cough Gastrointestinal: See HPI and otherwise negative Genitourinary: No dysuria or change in urinary frequency Neurological: No headache, dizziness or syncope Musculoskeletal: No new muscle or joint pain Hematologic: No bleeding or bruising Psychiatric: No history of depression or anxiety    Physical Exam:  Vital signs: There were no vitals taken for this visit.  Constitutional: NAD, alert and cooperative Head:  Normocephalic and atraumatic. Eyes:   PEERL, EOMI. No icterus. Conjunctiva pink. Respiratory: Respirations even and unlabored. Lungs clear to auscultation bilaterally.   No wheezes, crackles, or rhonchi.  Cardiovascular:  Regular rate and rhythm. No peripheral edema, cyanosis or pallor.  Gastrointestinal:  Soft, nondistended, nontender. No rebound or guarding. Normal bowel sounds. No appreciable masses or hepatomegaly. Rectal:  Declines Msk:  Symmetrical without gross deformities. Without edema, no deformity or joint abnormality.  Neurologic:  Alert and   oriented x4;  grossly normal neurologically.  Skin:   Dry and intact without significant lesions or rashes. Psychiatric: Oriented to person, place and time. Demonstrates good judgement and reason without abnormal affect or behaviors.  Physical Exam    RELEVANT LABS AND IMAGING: CBC    Component Value Date/Time   WBC 5.4 12/12/2024 1351   RBC 4.33 12/12/2024 1351   HGB 12.0 12/12/2024 1351   HGB 11.9 08/06/2022 1329   HCT 37.2 12/12/2024 1351   HCT 37.1 08/06/2022 1329   PLT 243.0 12/12/2024 1351   PLT 313 08/06/2022 1329   MCV 86.0 12/12/2024 1351   MCV 82 08/06/2022 1329   MCH 27.2 02/28/2024 1250   MCHC 32.2 12/12/2024 1351   RDW 16.0 (H) 12/12/2024 1351   RDW 17.0 (H) 08/06/2022 1329   LYMPHSABS 2.6 12/12/2024 1351   LYMPHSABS 2.8 08/06/2022 1329   MONOABS 0.3 12/12/2024 1351   EOSABS 0.1 12/12/2024 1351   EOSABS 0.1 08/06/2022 1329   BASOSABS 0.0 12/12/2024 1351   BASOSABS 0.0 08/06/2022 1329    CMP     Component Value Date/Time   NA 137 12/12/2024 1351   NA 143 11/02/2023 1417   K 4.1 12/12/2024 1351   CL 105 12/12/2024 1351   CO2 24 12/12/2024 1351   GLUCOSE 97 12/12/2024 1351   BUN 10 12/12/2024 1351   BUN 11 11/02/2023 1417   CREATININE 0.62 12/12/2024 1351   CALCIUM  9.5 12/12/2024 1351   PROT 7.1 12/12/2024 1351   ALBUMIN  4.2 07/25/2024 1111   AST 21 12/12/2024 1351   ALT 17 12/12/2024 1351   ALKPHOS 89 07/25/2024 1111   BILITOT 0.6 12/12/2024 1351   GFRNONAA >60 02/28/2024 1250   GFRNONAA 69 04/03/2021 1021   GFRAA 80 04/03/2021 1021     Assessment/Plan:   Assessment and Plan Assessment & Plan    History of colon polyps Family history of colon polyps in father Colonoscopy 10/2018 with Dr. Luis with 6 tubular adenomas  Hide 2 diabetes  Chaunte Hornbeck Mollie RIGGERS [facility] 12/26/2024, 3:05 PM  Cc: Katrinka Garnette KIDD, MD "

## 2024-12-28 ENCOUNTER — Encounter: Payer: Self-pay | Admitting: Internal Medicine

## 2024-12-28 ENCOUNTER — Encounter: Payer: Self-pay | Admitting: Gastroenterology

## 2024-12-28 ENCOUNTER — Ambulatory Visit: Admitting: Gastroenterology

## 2024-12-28 VITALS — BP 130/60 | HR 72 | Ht 62.5 in | Wt 196.5 lb

## 2024-12-28 DIAGNOSIS — N182 Chronic kidney disease, stage 2 (mild): Secondary | ICD-10-CM

## 2024-12-28 DIAGNOSIS — E1122 Type 2 diabetes mellitus with diabetic chronic kidney disease: Secondary | ICD-10-CM | POA: Diagnosis not present

## 2024-12-28 DIAGNOSIS — Z860100 Personal history of colon polyps, unspecified: Secondary | ICD-10-CM | POA: Diagnosis not present

## 2024-12-28 MED ORDER — NA SULFATE-K SULFATE-MG SULF 17.5-3.13-1.6 GM/177ML PO SOLN: 1.0000 | Freq: Once | ORAL | 0 refills | Status: AC

## 2024-12-28 NOTE — Patient Instructions (Signed)

## 2025-01-02 ENCOUNTER — Other Ambulatory Visit: Payer: Self-pay

## 2025-01-02 MED ORDER — ALCOHOL SWABS PADS: MEDICATED_PAD | 3 refills | Status: AC

## 2025-01-02 MED ORDER — ONETOUCH DELICA PLUS LANCET33G MISC: 1 refills | Status: AC

## 2025-01-04 ENCOUNTER — Ambulatory Visit: Admitting: Internal Medicine

## 2025-01-04 ENCOUNTER — Encounter: Payer: Self-pay | Admitting: Internal Medicine

## 2025-01-04 ENCOUNTER — Other Ambulatory Visit: Payer: Self-pay

## 2025-01-04 VITALS — BP 150/69 | HR 73 | Temp 97.2°F | Resp 18 | Ht 62.25 in | Wt 196.0 lb

## 2025-01-04 DIAGNOSIS — D123 Benign neoplasm of transverse colon: Secondary | ICD-10-CM

## 2025-01-04 DIAGNOSIS — Z860100 Personal history of colon polyps, unspecified: Secondary | ICD-10-CM

## 2025-01-04 MED ORDER — SODIUM CHLORIDE 0.9 % IV SOLN: 500.0000 mL | INTRAVENOUS | Status: DC

## 2025-01-04 MED ORDER — DILTIAZEM HCL ER COATED BEADS 300 MG PO CP24: 300.0000 mg | ORAL_CAPSULE | Freq: Every day | ORAL | 3 refills | Status: AC

## 2025-01-04 NOTE — Patient Instructions (Signed)
 YOU HAD AN ENDOSCOPIC PROCEDURE TODAY AT THE [facility]:   Refer to the procedure report that was given to you for any specific questions about what was found during the examination.  If the procedure report does not answer your questions, please call your gastroenterologist to clarify.  If you requested that your care partner not be given the details of your procedure findings, then the procedure report has been included in a sealed envelope for you to review at your convenience later.  YOU SHOULD EXPECT: Some feelings of bloating in the abdomen. Passage of more gas than usual.  Walking can help get rid of the air that was put into your GI tract during the procedure and reduce the bloating. If you had a lower endoscopy (such as a colonoscopy or flexible sigmoidoscopy) you may notice spotting of blood in your stool or on the toilet paper. If you underwent a bowel prep for your procedure, you may not have a normal bowel movement for a few days.  Please Note:  You might notice some irritation and congestion in your nose or some drainage.  This is from the oxygen used during your procedure.  There is no need for concern and it should clear up in a day or so.  SYMPTOMS TO REPORT IMMEDIATELY:  Following lower endoscopy (colonoscopy or flexible sigmoidoscopy):  Excessive amounts of blood in the stool  Significant tenderness or worsening of abdominal pains  Swelling of the abdomen that is new, acute  Fever of 100F or higher   Resume previous diet Continue present medications Await pathology results Handouts on polyps and diverticulosis given    For urgent or emergent issues, a gastroenterologist can be reached at any hour by calling [phone]. Do not use MyChart messaging for urgent concerns.    DIET:  We do recommend a small meal at first, but then you may proceed to your regular diet.  Drink plenty of fluids but you should avoid alcoholic beverages for 24 hours.  ACTIVITY:   You should plan to take it easy for the rest of today and you should NOT DRIVE or use heavy machinery until tomorrow (because of the sedation medicines used during the test).    FOLLOW UP: Our staff will call the number listed on your records the next business day following your procedure.  We will call around 7:15- 8:00 am to check on you and address any questions or concerns that you may have regarding the information given to you following your procedure. If we do not reach you, we will leave a message.     If any biopsies were taken you will be contacted by phone or by letter within the next 1-3 weeks.  Please call us  at [phone] if you have not heard about the biopsies in 3 weeks.    SIGNATURES/CONFIDENTIALITY: You and/or your care partner have signed paperwork which will be entered into your electronic medical record.  These signatures attest to the fact that that the information above on your After Visit Summary has been reviewed and is understood.  Full responsibility of the confidentiality of this discharge information lies with you and/or your care-partner.

## 2025-01-04 NOTE — Progress Notes (Unsigned)
 Report to PACU, RN, vss, BBS= Clear.

## 2025-01-04 NOTE — Op Note (Signed)
 [facility] Patient Name: Lauren Lopez Procedure Date: 01/04/2025 11:12 AM MRN: [id number] Endoscopist: Gordy CHRISTELLA Starch , MD, [id number] Age: 74 Referring MD:  Date of Birth: October 18, 1951 Gender: Female Account #: [id number] Procedure:                Colonoscopy Indications:              High risk colon cancer surveillance: Personal                            history of multiple adenomas, Last colonoscopy:                            October 2019 (TA x 6 with Dr. Luis) Medicines:                Monitored Anesthesia Care Procedure:                Pre-Anesthesia Assessment:                           - Prior to the procedure, a History and Physical                            was performed, and patient medications and                            allergies were reviewed. The patient's tolerance of                            previous anesthesia was also reviewed. The risks                            and benefits of the procedure and the sedation                            options and risks were discussed with the patient.                            All questions were answered, and informed consent                            was obtained. Prior Anticoagulants: The patient has                            taken no anticoagulant or antiplatelet agents. ASA                            Grade Assessment: II - A patient with mild systemic                            disease. After reviewing the risks and benefits,                            the patient was deemed in satisfactory condition to  undergo the procedure.                           After obtaining informed consent, the colonoscope                            was passed under direct vision. Throughout the                            procedure, the patient's blood pressure, pulse, and                            oxygen  saturations were monitored continuously. The                            Olympus Scope [id number]  was introduced through the                            anus and advanced to the cecum, identified by                            appendiceal orifice and ileocecal valve. The                            colonoscopy was performed without difficulty. The                            patient tolerated the procedure well. The quality                            of the bowel preparation was good. Scope In: 11:27:47 AM Scope Out: 11:46:07 AM Scope Withdrawal Time: 0 hours 14 minutes 2 seconds  Total Procedure Duration: 0 hours 18 minutes 20 seconds  Findings:                 The digital rectal exam was normal.                           Five sessile polyps were found in the transverse                            colon. The polyps were 3 to 6 mm in size. These                            polyps were removed with a cold snare. Resection                            and retrieval were complete.                           Multiple large-mouthed, medium-mouthed and                            small-mouthed diverticula were found in the sigmoid  colon and descending colon.                           The retroflexed view of the distal rectum and anal                            verge was normal and showed no anal or rectal                            abnormalities. Complications:            No immediate complications. Estimated Blood Loss:     Estimated blood loss was minimal. Impression:               - Five 3 to 6 mm polyps in the transverse colon,                            removed with a cold snare. Resected and retrieved.                           - Moderate diverticulosis in the sigmoid colon and                            in the descending colon.                           - The distal rectum and anal verge are normal on                            retroflexion view. Recommendation:           - Patient has a contact number available for                            emergencies. The signs  and symptoms of potential                            delayed complications were discussed with the                            patient. Return to normal activities tomorrow.                            Written discharge instructions were provided to the                            patient.                           - Resume previous diet.                           - Continue present medications.                           - Await pathology results.                           -  Repeat colonoscopy is recommended for                            surveillance. The colonoscopy date will be                            determined after pathology results from today's                            exam become available for review. Gordy CHRISTELLA Starch, MD 01/04/2025 11:50:31 AM This report has been signed electronically.

## 2025-01-04 NOTE — Progress Notes (Unsigned)
 Called to room to assist during endoscopic procedure.  Patient ID and intended procedure confirmed with present staff. Received instructions for my participation in the procedure from the performing physician.

## 2025-01-04 NOTE — Progress Notes (Unsigned)
 Pt's states no medical or surgical changes since previsit or office visit.

## 2025-01-04 NOTE — Progress Notes (Unsigned)
 See office note dated 12/28/2024 for details and current H&P  Patient presenting for colonoscopy in the [facility] due to her personal history of adenomatous colonic polyps  She remains appropriate for colonoscopy in the [facility] today  Last colonoscopy October 2019 with Dr. Luis.

## 2025-01-05 ENCOUNTER — Telehealth: Payer: Self-pay | Admitting: *Deleted

## 2025-01-05 NOTE — Telephone Encounter (Signed)
 No answer for follow up. Left a message.

## 2025-04-12 ENCOUNTER — Ambulatory Visit: Admitting: Family Medicine

## 2025-09-04 ENCOUNTER — Ambulatory Visit
